# Patient Record
Sex: Male | Born: 1945
Health system: Southern US, Community
[De-identification: ages and names within clinical notes are randomized; demographics above are authoritative.]

## PROBLEM LIST (undated history)

## (undated) DIAGNOSIS — I10 Essential (primary) hypertension: Secondary | ICD-10-CM

## (undated) DIAGNOSIS — Z951 Presence of aortocoronary bypass graft: Secondary | ICD-10-CM

## (undated) DIAGNOSIS — I5042 Chronic combined systolic (congestive) and diastolic (congestive) heart failure: Secondary | ICD-10-CM

## (undated) DIAGNOSIS — R079 Chest pain, unspecified: Secondary | ICD-10-CM

## (undated) DIAGNOSIS — R0602 Shortness of breath: Secondary | ICD-10-CM

## (undated) DIAGNOSIS — I255 Ischemic cardiomyopathy: Secondary | ICD-10-CM

## (undated) DIAGNOSIS — I219 Acute myocardial infarction, unspecified: Secondary | ICD-10-CM

## (undated) DIAGNOSIS — I779 Disorder of arteries and arterioles, unspecified: Secondary | ICD-10-CM

## (undated) DIAGNOSIS — M1990 Unspecified osteoarthritis, unspecified site: Secondary | ICD-10-CM

## (undated) DIAGNOSIS — Z9189 Other specified personal risk factors, not elsewhere classified: Secondary | ICD-10-CM

## (undated) DIAGNOSIS — J9600 Acute respiratory failure, unspecified whether with hypoxia or hypercapnia: Secondary | ICD-10-CM

## (undated) DIAGNOSIS — J40 Bronchitis, not specified as acute or chronic: Secondary | ICD-10-CM

## (undated) DIAGNOSIS — N189 Chronic kidney disease, unspecified: Secondary | ICD-10-CM

## (undated) DIAGNOSIS — Z95810 Presence of automatic (implantable) cardiac defibrillator: Secondary | ICD-10-CM

## (undated) DIAGNOSIS — I2510 Atherosclerotic heart disease of native coronary artery without angina pectoris: Secondary | ICD-10-CM

## (undated) DIAGNOSIS — I739 Peripheral vascular disease, unspecified: Secondary | ICD-10-CM

## (undated) DIAGNOSIS — I499 Cardiac arrhythmia, unspecified: Secondary | ICD-10-CM

## (undated) DIAGNOSIS — E785 Hyperlipidemia, unspecified: Secondary | ICD-10-CM

## (undated) HISTORY — DX: Peripheral vascular disease, unspecified: I73.9

## (undated) HISTORY — DX: Disorder of arteries and arterioles, unspecified: I77.9

## (undated) HISTORY — PX: APPENDECTOMY: SHX54

## (undated) HISTORY — PX: JOINT REPLACEMENT: SHX530

## (undated) HISTORY — PX: OTHER SURGICAL HISTORY: SHX169

## (undated) HISTORY — PX: EYE SURGERY: SHX253

## (undated) HISTORY — DX: Hyperlipidemia, unspecified: E78.5

---

## 1997-08-10 ENCOUNTER — Other Ambulatory Visit: Admission: RE | Admit: 1997-08-10 | Discharge: 1997-08-10 | Payer: Self-pay | Admitting: Internal Medicine

## 2002-12-12 ENCOUNTER — Emergency Department (HOSPITAL_COMMUNITY): Admission: EM | Admit: 2002-12-12 | Discharge: 2002-12-12 | Payer: Self-pay | Admitting: Emergency Medicine

## 2002-12-13 ENCOUNTER — Encounter: Payer: Self-pay | Admitting: Emergency Medicine

## 2002-12-13 ENCOUNTER — Emergency Department (HOSPITAL_COMMUNITY): Admission: EM | Admit: 2002-12-13 | Discharge: 2002-12-13 | Payer: Self-pay | Admitting: Emergency Medicine

## 2003-02-05 ENCOUNTER — Emergency Department (HOSPITAL_COMMUNITY): Admission: EM | Admit: 2003-02-05 | Discharge: 2003-02-05 | Payer: Self-pay | Admitting: Emergency Medicine

## 2003-02-08 ENCOUNTER — Emergency Department (HOSPITAL_COMMUNITY): Admission: EM | Admit: 2003-02-08 | Discharge: 2003-02-08 | Payer: Self-pay | Admitting: Emergency Medicine

## 2004-01-29 ENCOUNTER — Emergency Department (HOSPITAL_COMMUNITY): Admission: EM | Admit: 2004-01-29 | Discharge: 2004-01-29 | Payer: Self-pay | Admitting: Emergency Medicine

## 2004-02-11 ENCOUNTER — Emergency Department (HOSPITAL_COMMUNITY): Admission: EM | Admit: 2004-02-11 | Discharge: 2004-02-11 | Payer: Self-pay | Admitting: *Deleted

## 2004-03-08 ENCOUNTER — Ambulatory Visit: Payer: Self-pay | Admitting: Internal Medicine

## 2004-03-09 ENCOUNTER — Ambulatory Visit: Payer: Self-pay | Admitting: Internal Medicine

## 2004-03-10 ENCOUNTER — Ambulatory Visit: Payer: Self-pay | Admitting: *Deleted

## 2004-03-12 DIAGNOSIS — Z951 Presence of aortocoronary bypass graft: Secondary | ICD-10-CM

## 2004-03-12 HISTORY — PX: CORONARY ARTERY BYPASS GRAFT: SHX141

## 2004-03-12 HISTORY — DX: Presence of aortocoronary bypass graft: Z95.1

## 2004-03-17 ENCOUNTER — Emergency Department (HOSPITAL_COMMUNITY): Admission: EM | Admit: 2004-03-17 | Discharge: 2004-03-17 | Payer: Self-pay | Admitting: Emergency Medicine

## 2004-03-23 ENCOUNTER — Ambulatory Visit: Payer: Self-pay | Admitting: Internal Medicine

## 2004-03-23 ENCOUNTER — Ambulatory Visit: Payer: Self-pay | Admitting: *Deleted

## 2004-03-30 ENCOUNTER — Ambulatory Visit: Payer: Self-pay | Admitting: Internal Medicine

## 2004-05-05 ENCOUNTER — Ambulatory Visit: Payer: Self-pay | Admitting: Internal Medicine

## 2004-06-04 ENCOUNTER — Inpatient Hospital Stay (HOSPITAL_COMMUNITY): Admission: EM | Admit: 2004-06-04 | Discharge: 2004-06-10 | Payer: Self-pay | Admitting: Emergency Medicine

## 2004-06-05 HISTORY — PX: CARDIAC CATHETERIZATION: SHX172

## 2004-06-06 ENCOUNTER — Ambulatory Visit: Payer: Self-pay | Admitting: Internal Medicine

## 2004-06-06 HISTORY — PX: CARDIAC CATHETERIZATION: SHX172

## 2004-06-16 ENCOUNTER — Ambulatory Visit: Payer: Self-pay | Admitting: Internal Medicine

## 2004-07-01 ENCOUNTER — Ambulatory Visit (HOSPITAL_COMMUNITY): Admission: RE | Admit: 2004-07-01 | Discharge: 2004-07-01 | Payer: Self-pay | Admitting: Internal Medicine

## 2004-07-11 ENCOUNTER — Ambulatory Visit: Payer: Self-pay | Admitting: Internal Medicine

## 2004-07-15 ENCOUNTER — Emergency Department (HOSPITAL_COMMUNITY): Admission: EM | Admit: 2004-07-15 | Discharge: 2004-07-15 | Payer: Self-pay | Admitting: Emergency Medicine

## 2004-10-09 ENCOUNTER — Emergency Department (HOSPITAL_COMMUNITY): Admission: EM | Admit: 2004-10-09 | Discharge: 2004-10-10 | Payer: Self-pay | Admitting: Emergency Medicine

## 2004-10-26 ENCOUNTER — Ambulatory Visit: Payer: Self-pay | Admitting: Internal Medicine

## 2004-11-11 ENCOUNTER — Inpatient Hospital Stay (HOSPITAL_COMMUNITY): Admission: EM | Admit: 2004-11-11 | Discharge: 2004-11-30 | Payer: Self-pay | Admitting: Emergency Medicine

## 2004-11-14 HISTORY — PX: CARDIAC CATHETERIZATION: SHX172

## 2004-11-15 ENCOUNTER — Encounter (INDEPENDENT_AMBULATORY_CARE_PROVIDER_SITE_OTHER): Payer: Self-pay | Admitting: Cardiovascular Disease

## 2004-11-17 ENCOUNTER — Encounter: Payer: Self-pay | Admitting: Cardiothoracic Surgery

## 2004-11-18 ENCOUNTER — Ambulatory Visit: Payer: Self-pay | Admitting: Critical Care Medicine

## 2004-11-18 ENCOUNTER — Encounter (INDEPENDENT_AMBULATORY_CARE_PROVIDER_SITE_OTHER): Payer: Self-pay | Admitting: *Deleted

## 2004-12-09 ENCOUNTER — Ambulatory Visit: Payer: Self-pay | Admitting: Family Medicine

## 2004-12-09 ENCOUNTER — Observation Stay (HOSPITAL_COMMUNITY): Admission: EM | Admit: 2004-12-09 | Discharge: 2004-12-10 | Payer: Self-pay | Admitting: Emergency Medicine

## 2004-12-20 ENCOUNTER — Ambulatory Visit: Payer: Self-pay | Admitting: Internal Medicine

## 2005-01-09 ENCOUNTER — Emergency Department (HOSPITAL_COMMUNITY): Admission: EM | Admit: 2005-01-09 | Discharge: 2005-01-09 | Payer: Self-pay | Admitting: Emergency Medicine

## 2005-01-13 ENCOUNTER — Emergency Department (HOSPITAL_COMMUNITY): Admission: EM | Admit: 2005-01-13 | Discharge: 2005-01-13 | Payer: Self-pay | Admitting: Emergency Medicine

## 2005-01-19 ENCOUNTER — Encounter: Admission: RE | Admit: 2005-01-19 | Discharge: 2005-01-19 | Payer: Self-pay | Admitting: Cardiothoracic Surgery

## 2005-03-19 ENCOUNTER — Ambulatory Visit: Payer: Self-pay | Admitting: Internal Medicine

## 2005-06-26 ENCOUNTER — Inpatient Hospital Stay (HOSPITAL_COMMUNITY): Admission: EM | Admit: 2005-06-26 | Discharge: 2005-06-29 | Payer: Self-pay | Admitting: Emergency Medicine

## 2005-08-28 ENCOUNTER — Ambulatory Visit: Payer: Self-pay | Admitting: Internal Medicine

## 2005-09-17 ENCOUNTER — Emergency Department (HOSPITAL_COMMUNITY): Admission: EM | Admit: 2005-09-17 | Discharge: 2005-09-17 | Payer: Self-pay | Admitting: *Deleted

## 2005-09-18 ENCOUNTER — Ambulatory Visit: Payer: Self-pay | Admitting: Internal Medicine

## 2005-10-08 ENCOUNTER — Ambulatory Visit: Payer: Self-pay | Admitting: Internal Medicine

## 2006-02-07 ENCOUNTER — Ambulatory Visit: Payer: Self-pay | Admitting: *Deleted

## 2006-02-14 ENCOUNTER — Emergency Department: Payer: Self-pay | Admitting: Emergency Medicine

## 2006-02-14 ENCOUNTER — Other Ambulatory Visit: Payer: Self-pay

## 2006-03-16 ENCOUNTER — Emergency Department (HOSPITAL_COMMUNITY): Admission: EM | Admit: 2006-03-16 | Discharge: 2006-03-17 | Payer: Self-pay | Admitting: Emergency Medicine

## 2006-03-17 ENCOUNTER — Inpatient Hospital Stay (HOSPITAL_COMMUNITY): Admission: EM | Admit: 2006-03-17 | Discharge: 2006-03-21 | Payer: Self-pay | Admitting: Emergency Medicine

## 2006-05-07 ENCOUNTER — Ambulatory Visit: Payer: Self-pay | Admitting: Internal Medicine

## 2006-06-03 ENCOUNTER — Ambulatory Visit (HOSPITAL_COMMUNITY): Admission: RE | Admit: 2006-06-03 | Discharge: 2006-06-03 | Payer: Self-pay | Admitting: Internal Medicine

## 2006-06-25 ENCOUNTER — Ambulatory Visit: Payer: Self-pay | Admitting: Internal Medicine

## 2006-08-19 ENCOUNTER — Ambulatory Visit: Payer: Self-pay | Admitting: Internal Medicine

## 2006-08-20 ENCOUNTER — Ambulatory Visit: Payer: Self-pay | Admitting: Internal Medicine

## 2006-10-03 ENCOUNTER — Ambulatory Visit: Payer: Self-pay | Admitting: Internal Medicine

## 2006-10-21 ENCOUNTER — Ambulatory Visit: Payer: Self-pay | Admitting: Internal Medicine

## 2006-10-21 LAB — CONVERTED CEMR LAB
Barbiturate Quant, Ur: NEGATIVE
Benzodiazepines.: NEGATIVE
CO2: 20 meq/L (ref 19–32)
Calcium: 10.3 mg/dL (ref 8.4–10.5)
Chloride: 110 meq/L (ref 96–112)
Cocaine Metabolites: NEGATIVE
Creatinine, Ser: 2.11 mg/dL — ABNORMAL HIGH (ref 0.40–1.20)
HDL: 38 mg/dL — ABNORMAL LOW (ref >39)
LDL Cholesterol: 60 mg/dL (ref 0–99)
Marijuana Metabolite: NEGATIVE
Microalb, Ur: 15 mg/dL — ABNORMAL HIGH (ref 0.00–1.89)
Opiate Screen, Urine: NEGATIVE
Phencyclidine (PCP): NEGATIVE
Triglycerides: 236 mg/dL — ABNORMAL HIGH (ref <150)

## 2006-11-01 DIAGNOSIS — E089 Diabetes mellitus due to underlying condition without complications: Secondary | ICD-10-CM | POA: Insufficient documentation

## 2006-11-01 DIAGNOSIS — I2510 Atherosclerotic heart disease of native coronary artery without angina pectoris: Secondary | ICD-10-CM | POA: Insufficient documentation

## 2006-11-01 DIAGNOSIS — N259 Disorder resulting from impaired renal tubular function, unspecified: Secondary | ICD-10-CM | POA: Insufficient documentation

## 2006-11-01 DIAGNOSIS — I252 Old myocardial infarction: Secondary | ICD-10-CM | POA: Insufficient documentation

## 2006-11-01 DIAGNOSIS — F1910 Other psychoactive substance abuse, uncomplicated: Secondary | ICD-10-CM | POA: Insufficient documentation

## 2006-11-01 DIAGNOSIS — I509 Heart failure, unspecified: Secondary | ICD-10-CM | POA: Insufficient documentation

## 2006-11-01 DIAGNOSIS — F1410 Cocaine abuse, uncomplicated: Secondary | ICD-10-CM | POA: Insufficient documentation

## 2006-11-14 ENCOUNTER — Ambulatory Visit: Payer: Self-pay | Admitting: Internal Medicine

## 2006-11-21 ENCOUNTER — Ambulatory Visit: Payer: Self-pay | Admitting: Internal Medicine

## 2006-11-27 ENCOUNTER — Encounter (INDEPENDENT_AMBULATORY_CARE_PROVIDER_SITE_OTHER): Payer: Self-pay | Admitting: *Deleted

## 2006-12-06 ENCOUNTER — Ambulatory Visit: Payer: Self-pay | Admitting: Internal Medicine

## 2006-12-19 ENCOUNTER — Ambulatory Visit: Payer: Self-pay | Admitting: Internal Medicine

## 2007-01-17 ENCOUNTER — Ambulatory Visit: Payer: Self-pay | Admitting: Internal Medicine

## 2007-01-17 LAB — CONVERTED CEMR LAB
Benzodiazepines.: NEGATIVE
Marijuana Metabolite: NEGATIVE
Phencyclidine (PCP): NEGATIVE

## 2007-05-29 ENCOUNTER — Ambulatory Visit: Payer: Self-pay | Admitting: Internal Medicine

## 2007-07-02 ENCOUNTER — Ambulatory Visit: Payer: Self-pay | Admitting: Internal Medicine

## 2007-07-02 LAB — CONVERTED CEMR LAB
ALT: 14 units/L (ref 0–35)
AST: 16 units/L (ref 0–37)
Albumin: 4.1 g/dL (ref 3.5–5.2)
Alkaline Phosphatase: 134 units/L — ABNORMAL HIGH (ref 39–117)
Amphetamine Screen, Ur: NEGATIVE
BUN: 33 mg/dL — ABNORMAL HIGH (ref 6–23)
Barbiturate Quant, Ur: NEGATIVE
Benzodiazepines.: NEGATIVE
CO2: 19 meq/L (ref 19–32)
Calcium: 9.9 mg/dL (ref 8.4–10.5)
Cocaine Metabolites: NEGATIVE
Phencyclidine (PCP): NEGATIVE
Total Bilirubin: 0.3 mg/dL (ref 0.3–1.2)
Total Protein: 8 g/dL (ref 6.0–8.3)

## 2007-08-12 ENCOUNTER — Ambulatory Visit: Payer: Self-pay | Admitting: Internal Medicine

## 2008-02-14 ENCOUNTER — Inpatient Hospital Stay: Payer: Medicare Other | Admitting: Psychiatry

## 2008-04-09 ENCOUNTER — Encounter
Admission: RE | Admit: 2008-04-09 | Discharge: 2008-06-04 | Payer: Self-pay | Admitting: Physical Medicine & Rehabilitation

## 2008-06-04 ENCOUNTER — Ambulatory Visit: Payer: Self-pay | Admitting: Physical Medicine & Rehabilitation

## 2008-06-08 ENCOUNTER — Ambulatory Visit (HOSPITAL_COMMUNITY)
Admission: RE | Admit: 2008-06-08 | Discharge: 2008-06-08 | Payer: Self-pay | Admitting: Physical Medicine & Rehabilitation

## 2008-07-05 ENCOUNTER — Encounter
Admission: RE | Admit: 2008-07-05 | Discharge: 2008-07-15 | Payer: Self-pay | Admitting: Physical Medicine & Rehabilitation

## 2008-07-15 ENCOUNTER — Ambulatory Visit: Payer: Self-pay | Admitting: Physical Medicine & Rehabilitation

## 2008-08-05 ENCOUNTER — Ambulatory Visit: Payer: Self-pay | Admitting: Internal Medicine

## 2008-08-05 LAB — CONVERTED CEMR LAB
ALT: 11 units/L (ref 0–35)
AST: 13 units/L (ref 0–37)
Alkaline Phosphatase: 132 units/L — ABNORMAL HIGH (ref 39–117)
CO2: 23 meq/L (ref 19–32)
Calcium: 9.5 mg/dL (ref 8.4–10.5)
Cholesterol: 177 mg/dL (ref 0–200)
HDL: 50 mg/dL (ref >39)
LDL Cholesterol: 94 mg/dL (ref 0–99)
Phosphorus: 2.3 mg/dL (ref 2.3–4.6)
Potassium: 3.8 meq/L (ref 3.5–5.3)
Sodium: 140 meq/L (ref 135–145)
Total CHOL/HDL Ratio: 3.5
Total Protein: 7.4 g/dL (ref 6.0–8.3)
Triglycerides: 167 mg/dL — ABNORMAL HIGH (ref <150)

## 2008-11-21 ENCOUNTER — Inpatient Hospital Stay: Payer: Medicare Other | Admitting: Psychiatry

## 2009-03-24 ENCOUNTER — Ambulatory Visit: Payer: Self-pay | Admitting: Internal Medicine

## 2009-03-24 LAB — CONVERTED CEMR LAB
AST: 11 units/L (ref 0–37)
Alkaline Phosphatase: 152 units/L — ABNORMAL HIGH (ref 39–117)
CO2: 26 meq/L (ref 19–32)
Calcium: 9.8 mg/dL (ref 8.4–10.5)
Chloride: 108 meq/L (ref 96–112)
Glucose, Bld: 136 mg/dL — ABNORMAL HIGH (ref 70–99)
Potassium: 4.1 meq/L (ref 3.5–5.3)
Sodium: 142 meq/L (ref 135–145)
Total Bilirubin: 0.2 mg/dL — ABNORMAL LOW (ref 0.3–1.2)
Total CHOL/HDL Ratio: 3
Triglycerides: 67 mg/dL (ref <150)

## 2009-09-23 ENCOUNTER — Ambulatory Visit: Payer: Self-pay | Admitting: Internal Medicine

## 2009-10-15 ENCOUNTER — Inpatient Hospital Stay (HOSPITAL_COMMUNITY): Admission: EM | Admit: 2009-10-15 | Discharge: 2009-10-18 | Payer: Self-pay | Admitting: Emergency Medicine

## 2009-10-26 ENCOUNTER — Encounter (INDEPENDENT_AMBULATORY_CARE_PROVIDER_SITE_OTHER): Payer: Self-pay | Admitting: Cardiology

## 2009-10-26 ENCOUNTER — Ambulatory Visit (HOSPITAL_COMMUNITY): Admission: RE | Admit: 2009-10-26 | Discharge: 2009-10-26 | Payer: Self-pay | Admitting: Cardiology

## 2009-10-27 ENCOUNTER — Emergency Department (HOSPITAL_COMMUNITY): Admission: EM | Admit: 2009-10-27 | Discharge: 2009-10-27 | Payer: Self-pay | Admitting: Emergency Medicine

## 2009-11-04 ENCOUNTER — Ambulatory Visit: Payer: Self-pay | Admitting: Internal Medicine

## 2009-11-14 ENCOUNTER — Emergency Department (HOSPITAL_COMMUNITY): Admission: EM | Admit: 2009-11-14 | Discharge: 2009-11-14 | Payer: Self-pay | Admitting: Emergency Medicine

## 2009-11-23 ENCOUNTER — Inpatient Hospital Stay (HOSPITAL_COMMUNITY): Admission: EM | Admit: 2009-11-23 | Discharge: 2009-11-25 | Payer: Self-pay | Admitting: Emergency Medicine

## 2010-01-03 ENCOUNTER — Emergency Department (HOSPITAL_COMMUNITY)
Admission: EM | Admit: 2010-01-03 | Discharge: 2010-01-03 | Payer: Self-pay | Source: Home / Self Care | Admitting: Emergency Medicine

## 2010-04-01 ENCOUNTER — Encounter: Payer: Self-pay | Admitting: Cardiothoracic Surgery

## 2010-05-24 LAB — URINALYSIS, ROUTINE W REFLEX MICROSCOPIC
Leukocytes, UA: NEGATIVE
pH: 5 (ref 5.0–8.0)

## 2010-05-24 LAB — URINE MICROSCOPIC-ADD ON

## 2010-05-25 LAB — DIFFERENTIAL
Basophils Relative: 1 % (ref 0–1)
Eosinophils Absolute: 0.1 10*3/uL (ref 0.0–0.7)
Eosinophils Relative: 4 % (ref 0–5)
Lymphs Abs: 1 10*3/uL (ref 0.7–4.0)
Monocytes Relative: 8 % (ref 3–12)
Neutrophils Relative %: 57 % (ref 43–77)

## 2010-05-25 LAB — BASIC METABOLIC PANEL
BUN: 28 mg/dL — ABNORMAL HIGH (ref 6–23)
CO2: 24 mEq/L (ref 19–32)
CO2: 25 mEq/L (ref 19–32)
Calcium: 10 mg/dL (ref 8.4–10.5)
Calcium: 9.3 mg/dL (ref 8.4–10.5)
Chloride: 112 mEq/L (ref 96–112)
Creatinine, Ser: 1.88 mg/dL — ABNORMAL HIGH (ref 0.4–1.5)
Creatinine, Ser: 1.99 mg/dL — ABNORMAL HIGH (ref 0.4–1.5)
Creatinine, Ser: 2.19 mg/dL — ABNORMAL HIGH (ref 0.4–1.5)
GFR calc Af Amer: 37 mL/min — ABNORMAL LOW (ref >60)
GFR calc Af Amer: 41 mL/min — ABNORMAL LOW (ref >60)
GFR calc Af Amer: 44 mL/min — ABNORMAL LOW (ref >60)
GFR calc non Af Amer: 31 mL/min — ABNORMAL LOW (ref >60)
GFR calc non Af Amer: 34 mL/min — ABNORMAL LOW (ref >60)
GFR calc non Af Amer: 36 mL/min — ABNORMAL LOW (ref >60)
Glucose, Bld: 175 mg/dL — ABNORMAL HIGH (ref 70–99)
Sodium: 143 mEq/L (ref 135–145)

## 2010-05-25 LAB — URINALYSIS, ROUTINE W REFLEX MICROSCOPIC
Bilirubin Urine: NEGATIVE
Glucose, UA: NEGATIVE mg/dL
Hgb urine dipstick: NEGATIVE
Ketones, ur: NEGATIVE mg/dL
Protein, ur: 100 mg/dL — AB
Urobilinogen, UA: 1 mg/dL (ref 0.0–1.0)

## 2010-05-25 LAB — POCT CARDIAC MARKERS
CKMB, poc: <1 ng/mL — ABNORMAL LOW (ref 1.0–8.0)
Myoglobin, poc: 85.6 ng/mL (ref 12–200)
Troponin i, poc: <0.05 ng/mL (ref 0.00–0.09)

## 2010-05-25 LAB — CBC
HCT: 32.1 % — ABNORMAL LOW (ref 39.0–52.0)
Hemoglobin: 10.6 g/dL — ABNORMAL LOW (ref 13.0–17.0)
Hemoglobin: 10.9 g/dL — ABNORMAL LOW (ref 13.0–17.0)
MCV: 90.8 fL (ref 78.0–100.0)
RDW: 13.3 % (ref 11.5–15.5)
WBC: 3.2 10*3/uL — ABNORMAL LOW (ref 4.0–10.5)

## 2010-05-25 LAB — URINE DRUGS OF ABUSE SCREEN W ALC, ROUTINE (REF LAB)
Amphetamine Screen, Ur: NEGATIVE
Benzodiazepines.: NEGATIVE
Cocaine Metabolites: NEGATIVE
Ethyl Alcohol: <10 mg/dL (ref <10)
Opiate Screen, Urine: NEGATIVE
Phencyclidine (PCP): NEGATIVE

## 2010-05-25 LAB — CARDIAC PANEL(CRET KIN+CKTOT+MB+TROPI)
CK, MB: 1.2 ng/mL (ref 0.3–4.0)
Relative Index: INVALID (ref 0.0–2.5)
Relative Index: INVALID (ref 0.0–2.5)
Relative Index: INVALID (ref 0.0–2.5)
Total CK: 78 U/L (ref 7–232)
Total CK: 85 U/L (ref 7–232)
Troponin I: 0.04 ng/mL (ref 0.00–0.06)

## 2010-05-25 LAB — URINE MICROSCOPIC-ADD ON

## 2010-05-25 LAB — MAGNESIUM: Magnesium: 2.3 mg/dL (ref 1.5–2.5)

## 2010-05-25 LAB — GLUCOSE, CAPILLARY
Glucose-Capillary: 118 mg/dL — ABNORMAL HIGH (ref 70–99)
Glucose-Capillary: 233 mg/dL — ABNORMAL HIGH (ref 70–99)
Glucose-Capillary: 155 mg/dL — ABNORMAL HIGH (ref 70–99)

## 2010-05-25 LAB — COMPREHENSIVE METABOLIC PANEL
ALT: 26 U/L (ref 0–53)
AST: 26 U/L (ref 0–37)
Alkaline Phosphatase: 135 U/L — ABNORMAL HIGH (ref 39–117)
CO2: 23 mEq/L (ref 19–32)
GFR calc Af Amer: 40 mL/min — ABNORMAL LOW (ref >60)
Glucose, Bld: 160 mg/dL — ABNORMAL HIGH (ref 70–99)
Potassium: 4.8 mEq/L (ref 3.5–5.1)
Sodium: 142 mEq/L (ref 135–145)
Total Protein: 6.6 g/dL (ref 6.0–8.3)

## 2010-05-25 LAB — HEMOGLOBIN A1C
Hgb A1c MFr Bld: 7.9 % — ABNORMAL HIGH (ref <5.7)
Mean Plasma Glucose: 180 mg/dL — ABNORMAL HIGH (ref <117)

## 2010-05-25 LAB — TSH: TSH: 1.829 u[IU]/mL (ref 0.350–4.500)

## 2010-05-25 LAB — BRAIN NATRIURETIC PEPTIDE
Pro B Natriuretic peptide (BNP): 446 pg/mL — ABNORMAL HIGH (ref 0.0–100.0)
Pro B Natriuretic peptide (BNP): 478 pg/mL — ABNORMAL HIGH (ref 0.0–100.0)

## 2010-05-25 LAB — STREP A DNA PROBE

## 2010-05-26 LAB — LIPID PANEL
Cholesterol: 136 mg/dL (ref 0–200)
HDL: 57 mg/dL (ref >39)
Total CHOL/HDL Ratio: 2.4 RATIO
VLDL: 12 mg/dL (ref 0–40)

## 2010-05-26 LAB — GLUCOSE, CAPILLARY
Glucose-Capillary: 120 mg/dL — ABNORMAL HIGH (ref 70–99)
Glucose-Capillary: 134 mg/dL — ABNORMAL HIGH (ref 70–99)
Glucose-Capillary: 154 mg/dL — ABNORMAL HIGH (ref 70–99)
Glucose-Capillary: 162 mg/dL — ABNORMAL HIGH (ref 70–99)
Glucose-Capillary: 169 mg/dL — ABNORMAL HIGH (ref 70–99)
Glucose-Capillary: 185 mg/dL — ABNORMAL HIGH (ref 70–99)

## 2010-05-26 LAB — CBC
HCT: 33.1 % — ABNORMAL LOW (ref 39.0–52.0)
Hemoglobin: 11 g/dL — ABNORMAL LOW (ref 13.0–17.0)
Hemoglobin: 11.1 g/dL — ABNORMAL LOW (ref 13.0–17.0)
Hemoglobin: 11.1 g/dL — ABNORMAL LOW (ref 13.0–17.0)
MCH: 30 pg (ref 26.0–34.0)
MCH: 31 pg (ref 26.0–34.0)
MCHC: 33.2 g/dL (ref 30.0–36.0)
MCV: 92.5 fL (ref 78.0–100.0)
Platelets: 153 10*3/uL (ref 150–400)
RBC: 3.58 MIL/uL — ABNORMAL LOW (ref 4.22–5.81)
RBC: 3.7 MIL/uL — ABNORMAL LOW (ref 4.22–5.81)
RDW: 13.5 % (ref 11.5–15.5)
WBC: 4.7 10*3/uL (ref 4.0–10.5)
WBC: 4.8 10*3/uL (ref 4.0–10.5)

## 2010-05-26 LAB — COMPREHENSIVE METABOLIC PANEL
ALT: 15 U/L (ref 0–53)
ALT: 17 U/L (ref 0–53)
AST: 14 U/L (ref 0–37)
AST: 15 U/L (ref 0–37)
Albumin: 3.2 g/dL — ABNORMAL LOW (ref 3.5–5.2)
Albumin: 3.2 g/dL — ABNORMAL LOW (ref 3.5–5.2)
CO2: 23 mEq/L (ref 19–32)
Calcium: 9.1 mg/dL (ref 8.4–10.5)
Chloride: 114 mEq/L — ABNORMAL HIGH (ref 96–112)
Chloride: 115 mEq/L — ABNORMAL HIGH (ref 96–112)
Creatinine, Ser: 1.67 mg/dL — ABNORMAL HIGH (ref 0.4–1.5)
Creatinine, Ser: 1.76 mg/dL — ABNORMAL HIGH (ref 0.4–1.5)
GFR calc Af Amer: 48 mL/min — ABNORMAL LOW (ref >60)
GFR calc Af Amer: 51 mL/min — ABNORMAL LOW (ref >60)
GFR calc non Af Amer: 39 mL/min — ABNORMAL LOW (ref >60)
Sodium: 142 mEq/L (ref 135–145)
Sodium: 143 mEq/L (ref 135–145)
Total Bilirubin: 0.6 mg/dL (ref 0.3–1.2)

## 2010-05-26 LAB — POCT I-STAT, CHEM 8
BUN: 27 mg/dL — ABNORMAL HIGH (ref 6–23)
Calcium, Ion: 1.24 mmol/L (ref 1.12–1.32)
Creatinine, Ser: 1.8 mg/dL — ABNORMAL HIGH (ref 0.4–1.5)
Hemoglobin: 11.6 g/dL — ABNORMAL LOW (ref 13.0–17.0)
TCO2: 21 mmol/L (ref 0–100)

## 2010-05-26 LAB — DIFFERENTIAL
Eosinophils Absolute: 0.1 10*3/uL (ref 0.0–0.7)
Eosinophils Absolute: 0.1 10*3/uL (ref 0.0–0.7)
Eosinophils Relative: 3 % (ref 0–5)
Eosinophils Relative: 3 % (ref 0–5)
Lymphocytes Relative: 19 % (ref 12–46)
Lymphocytes Relative: 19 % (ref 12–46)
Lymphs Abs: 0.9 10*3/uL (ref 0.7–4.0)
Lymphs Abs: 0.9 10*3/uL (ref 0.7–4.0)
Monocytes Absolute: 0.4 10*3/uL (ref 0.1–1.0)
Monocytes Absolute: 0.4 10*3/uL (ref 0.1–1.0)

## 2010-05-26 LAB — BASIC METABOLIC PANEL
CO2: 24 mEq/L (ref 19–32)
Calcium: 9.2 mg/dL (ref 8.4–10.5)
Chloride: 106 mEq/L (ref 96–112)
GFR calc Af Amer: 40 mL/min — ABNORMAL LOW (ref >60)
GFR calc Af Amer: 49 mL/min — ABNORMAL LOW (ref >60)
GFR calc non Af Amer: 35 mL/min — ABNORMAL LOW (ref >60)
GFR calc non Af Amer: 40 mL/min — ABNORMAL LOW (ref >60)
Glucose, Bld: 139 mg/dL — ABNORMAL HIGH (ref 70–99)
Potassium: 3.5 mEq/L (ref 3.5–5.1)
Sodium: 137 mEq/L (ref 135–145)
Sodium: 138 mEq/L (ref 135–145)
Sodium: 140 mEq/L (ref 135–145)

## 2010-05-26 LAB — PROTIME-INR
INR: 1.04 (ref 0.00–1.49)
Prothrombin Time: 13.8 seconds (ref 11.6–15.2)

## 2010-05-26 LAB — BRAIN NATRIURETIC PEPTIDE: Pro B Natriuretic peptide (BNP): 308 pg/mL — ABNORMAL HIGH (ref 0.0–100.0)

## 2010-05-26 LAB — APTT: aPTT: 33 seconds (ref 24–37)

## 2010-06-13 ENCOUNTER — Other Ambulatory Visit: Payer: Self-pay | Admitting: Internal Medicine

## 2010-07-12 ENCOUNTER — Other Ambulatory Visit: Payer: Self-pay | Admitting: Internal Medicine

## 2010-07-13 ENCOUNTER — Other Ambulatory Visit: Payer: Self-pay | Admitting: Internal Medicine

## 2010-07-25 ENCOUNTER — Emergency Department (HOSPITAL_COMMUNITY)
Admission: EM | Admit: 2010-07-25 | Discharge: 2010-07-25 | Disposition: A | Discharge status: Home / Self Care | Payer: Medicare Other | Attending: Emergency Medicine | Admitting: Emergency Medicine

## 2010-07-25 DIAGNOSIS — I2510 Atherosclerotic heart disease of native coronary artery without angina pectoris: Secondary | ICD-10-CM | POA: Insufficient documentation

## 2010-07-25 DIAGNOSIS — M25539 Pain in unspecified wrist: Secondary | ICD-10-CM | POA: Insufficient documentation

## 2010-07-25 DIAGNOSIS — I252 Old myocardial infarction: Secondary | ICD-10-CM | POA: Insufficient documentation

## 2010-07-25 DIAGNOSIS — E119 Type 2 diabetes mellitus without complications: Secondary | ICD-10-CM | POA: Insufficient documentation

## 2010-07-25 DIAGNOSIS — I509 Heart failure, unspecified: Secondary | ICD-10-CM | POA: Insufficient documentation

## 2010-07-25 DIAGNOSIS — I10 Essential (primary) hypertension: Secondary | ICD-10-CM | POA: Insufficient documentation

## 2010-07-25 NOTE — Procedures (Signed)
NAME:  RONDO, KOEL NO.:  [id number]   MEDICAL RECORD NO.:  [id number]           PATIENT TYPE:   LOCATION:                                 FACILITY:   PHYSICIAN:  Charlett Blake, M.D.DATE OF BIRTH:  01-04-1946   DATE OF PROCEDURE:  07/15/2008  DATE OF DISCHARGE:                               OPERATIVE REPORT   Right sacroiliac injection under fluoroscopic guidance.   INDICATION:  Right sacroiliac mediated pain.  He has pain in the buttock  and then the back, history of pain after that persisted despite hip  replacement.  Pain is only partially responsive to medication management  on a conservative care and interferes with self-care mobility.   Informed consent was obtained after describing the risks and benefits of  the procedure with the patient.  These include bleeding, bruising, and  infection.  He elects to proceed and has given written consent.  The  patient placed prone on fluoroscopy table.  Betadine prep, sterile  drape.  A 25-gauge 1-/12-inch needle was used to anesthetize the skin  and subcutaneous tissue with 1% lidocaine x2 mL and a 25-gauge 3-inches  spinal needle was inserted under fluoroscopic guidance, first targeting  the right SI joint.  AP, lateral, and oblique imaging utilized.  Omnipaque 180 under live fluoro x 0.5 mL demonstrated good joint  arthrogram followed by injection of 1 mL of 2% MPF lidocaine and 1.5 mL  of 40 mg/mL of Depo-Medrol.  The patient tolerated the procedure well.  Pre-injection pain level is 9/10.  Post-injection is 0/10.      Charlett Blake, M.D.  Electronically Signed     AEK/MEDQ  D:  07/15/2008 09:03:44  T:  07/15/2008 22:56:47  Job:  [id number]
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
## 2010-07-28 NOTE — H&P (Signed)
NAME:  [name], [name]               ACCOUNT NO.:  [id number]   MEDICAL RECORD NO.:  [id number]          PATIENT TYPE:  INP   LOCATION:  1403                         FACILITY:  [facility]   PHYSICIAN:  Hind I Elsaid, MD      DATE OF BIRTH:  12/20/1945   DATE OF ADMISSION:  03/17/2006  DATE OF DISCHARGE:                              HISTORY & PHYSICAL   PRIMARY CARE PHYSICIAN:  Unassigned.   HISTORY OF PRESENT ILLNESS:  A 65 year old African American male with  history of coronary artery disease, status post bypass in 2006, history  of cocaine abuse, admitted to the hospital with history of right flank  pain.  The patient admitted he used overdose of cocaine for two days  about $1400 dollars of crack per day, and the patient on Saturday for  two days and the patient decided on Saturday, he wanted to be detoxed  and to quit cocaine abuse.  The same day on he continued to complain of  right flank pain, rated pain 10 out of 10,  sharp pain, not radiating  and also status post fever. No associated with change in bowel habits.  No nausea, no vomiting but he noticed a small amount of his diminishing  urine output according to him.  Also no change in the color of the  urine.  He also complained of right hip pain, right thigh pain and .Marland Kitchen...   Dictation Ended At [employer].      Hind Franco Collet, MD  Electronically Signed     HIE/MEDQ  D:  03/17/2006  T:  03/18/2006  Job:  [id number]

## 2010-07-28 NOTE — H&P (Signed)
NAME:  [name], [name]               ACCOUNT NO.:  [id number]   MEDICAL RECORD NO.:  [id number]          PATIENT TYPE:  EMS   LOCATION:  ED                           FACILITY:  [facility]   PHYSICIAN:  Minna Merritts, M.D.DATE OF BIRTH:  [birth year]   DATE OF ADMISSION:  11/11/2004  DATE OF DISCHARGE:                                HISTORY & PHYSICAL   PRIMARY PHYSICIAN:  [facility].   CHIEF COMPLAINT:  Chest pain for last 8-9 hours.   HISTORY OF PRESENT ILLNESS:  This is a 64 year old African-American male  patient with a significant history of coronary artery disease, diabetes,  substance abuse, was in his usual state of health until last midnight when  he suddenly started left-sided chest pain.  The pain was 10/10 severe to  start with, nonradiating, was associated with diaphoresis.  The patient  denies having any nausea, vomiting, shortness of breath, dizziness.  The  pain at present is 8/10.  The pain is constantly present over the last 9  hours.  Of note, the patient uses cocaine.  His last use was 2 days ago.  In  the emergency department, the patient was given intravenous Ativan.  At  present, it is difficult to get any history or review of systems from the  patient.  The patient is very drowsy and drifts off to sleep after saying 1-  2 words.  Also, it is difficult to get any review of systems from him.   PAST MEDICAL HISTORY:  1.  Coronary artery disease; he is status post ST elevation MI and has had      drug-eluting stents placed to LAD, circumflex, right coronary artery in      on June 05, 2004.  2.  On coronary angiogram, he was found to have systolic dysfunction with      left ventricular systolic function of about 40-45%.  3.  Diabetes, type 2, uncontrolled as per the history from his last      hospitalization in March 2006.  At that time, his hemoglobin A1c was      8.1.  4.  Hypertension.  5.  Substance abuse.  6.  Hyperlipidemia.   MEDICATIONS:  As  mentioned above, at present, the patient is very drowsy and  unable to give history.  As per his discharge from April 2006, he was given  following medications:  1.  Aspirin 325 mg daily.  2.  Plavix  3.  Zocor 40 mg daily.  4.  Enalapril 10 mg daily.  5.  Glucotrol 10 mg p.o. twice daily.  6.  Glucophage 500 mg p.o. twice daily.  7.  Nitroglycerin 0.4 mg sublingual p.r.n.  8.  Labetalol 300 mg p.o. twice daily.  9.  The patient mentions that he take takes hydrochlorothiazide as well.   FAMILY HISTORY:  The patient is a father of three; there is a family history  of hypertension, history of heart attack in his grandfather.   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  He has  a history of cocaine drug use since 1970s.  He  was in rehab some time through the years.  For 1 year he was free of  cocaine, and then he started again.  He lives by himself; he is a nonsmoker  and does not use any alcohol.   REVIEW OF SYSTEMS:  As mentioned above unable to obtain at this time.   PHYSICAL EXAMINATION:  GENERAL APPEARANCE:  The patient does not appear to  be in acute distress; he is very drowsy and drifts off to sleep.  VITAL SIGNS:  His blood pressure is 143/65, temperature is 96.4 decreased.  Pulse is 75 per minute, respirations 18 per minute.  His pain and chest pain  at this time is 8/10; he is saturating 96% on room air.  HEENT:  Head is atraumatic, normocephalic; pupils are equally reactive to  light bilaterally; extraocular muscles are difficult to assess at this time.  His pharynx appears to be normal.  NECK:  It is supple, no jugular venous distension, no carotid bruit.  Thyroid appears normal.  CARDIOVASCULAR:  S1, S2 is normal, regular rate and rhythm, no murmurs.  CHEST:  There is palpation tenderness over the precordium and slightly  towards his left shoulder.  PULMONARY:  Clear to auscultation bilaterally, no adventitious  sounds.  ABDOMEN:  Soft, nontender, nondistended, equal normally active bowel sounds  in all quadrants, no hepatosplenomegaly.  EXTREMITIES:  No cyanosis, clubbing or edema.  NEUROLOGICAL:  The patient answers questions appropriately, although he is  very drowsy at this time.  He is oriented in time, place, and person.  Overall. the neurological examination is without any focal deficit.   LABORATORY DATA:  His WBC count is 5.1, hemoglobin 11.7, hematocrit 34.7,  platelet count is 168,000.  Sodium is 139, potassium 4.1, chloride 106, CO2  is 23, BUN is 27, creatinine is 1.7.  Reviewing his previous labs from the e-  chart, his serum creatinine baseline appears to be 1.4.   First set of cardiac enzymes shows myoglobin also elevated at 491.  CK-MB is  2.7 which is normal, and troponin I is less than 0.05.  Next, a chest x-ray  is negative, does not show any active cardiopulmonary disease. Next, 12-lead  ECG shows a heart normal sinus rhythm, it shows frequent PVCs, otherwise  there do not appear to be any significant ST-T changes.   ASSESSMENT AND PLAN:  7.  A 65 year old African-American male with significant history of coronary      artery disease, cocaine abuse presenting with chest pain.  At this time      we will admit him to telemetry unit.  We will start aspirin and Plavix.      He will also be started on nitro paste as well.  We will get serial      cardiac enzymes and 12-lead ECGs.  If his chest pain does not improve or      if he has any significant findings on his labs or 12-lead ECG, we will      consult cardiology.  2.  Diabetes type 2.  At this time. we will hold off his oral hypoglycemic      agents.  We will start him on insulin sliding scale.  We will be      checking his capillary blood sugars q.h.s., q.a.c.  Also, we will check      a hemoglobin A1c. 3.  Hypertension.  He will be started back on his regular  medications which      include hydrochlorothiazide, Norvasc,  enalapril.  As per the discharge      note from April 2006, he was placed on labetalol.  It is not clear      though if he continues to take it.  We will resume if he takes it      regularly at home.  4.  Hyperlipidemia.  We will continue the Zocor at 40 mg daily.  We will get      a fasting lipid profile in the morning.  5.  Substance abuse.  The patient asking for help regarding rehabilitation.      We will consult case management.  6.  For prophylaxis of PUD and deep venous thrombosis, we will put him on      Lovenox subcutaneous 40 mg daily, Protonix p.o.           ______________________________  Minna Merritts, M.D.     PMJ/MEDQ  D:  11/11/2004  T:  11/11/2004  Job:  [id number]

## 2010-07-28 NOTE — Discharge Summary (Signed)
NAMECLEAVELAND, SEGA               ACCOUNT NO.:  [id number]   MEDICAL RECORD NO.:  [id number]          PATIENT TYPE:  INP   LOCATION:  [id number]                         FACILITY:  [facility]   PHYSICIAN:  Vernell Leep, MD     DATE OF BIRTH:  [birth year]   DATE OF ADMISSION:  03/17/2006  DATE OF DISCHARGE:  03/21/2006                               DISCHARGE SUMMARY   Patient left AMA.   PRIMARY CARE PHYSICIAN:  Unclear at this time.  Patient has seen Dr.  Erin Hearing of family medicine in the past.  Have placed a call to the  service at [facility] to clarify this and if they are willing  to follow him up, otherwise patient will be unassigned to the [facility] system.   DISCHARGE DIAGNOSES:  1. Right lower back pain.  2. Substance abuse.  3. Diabetes.  4. Hypertension.  5. Coronary artery disease.  6. Acute renal failure.  7. Pancytopenia.  8. Right upper pole kidney lesion on CT, to be followed up and worked      up as an outpatient as deemed necessary.   DISCHARGE MEDICATIONS:  1. Aspirin 81 mg p.o. daily.  2. Plavix 75 mg p.o. daily.  3. Avandia 2 mg p.o. daily.  4. Glucotrol 5 mg p.o. daily.  5. Norvasc 10 mg p.o. daily.  6. Catapres 0.1 mg p.o. b.i.d.  7. Flexeril 5 mg p.o. 3 times a day for a week.  8. Tylenol 650 mg p.o. q.4-6h. p.r.n.   PROCEDURES DONE:  1. On March 19, 2006, a CT of the abdomen and pelvis with contrast.      Impression is there is no evidence of obstructive uropathy.  No      nephrolithiasis is noted.  A low density, exophytic lesion arising      from the upper pole of the right kidney is unchanged but it is      incompletely characterized without IV contrast media.  Atelectasis      versus scar at the left base.  2. Pelvic CT without contrast.  Impression:  Stable CT of the pelvis.      No acute findings.  No bladder calculi.  3. On March 18, 2006, chest x-ray.  Impression:  No acute findings.      Postoperative changes from coronary  bypass.  Chronic left lower      lobe scarring.  Right lower chest nipple shadow, stable compared to      the 19th of April, 2007.  4. On March 18, 2006, renal ultrasound.  Impression is right renal      cyst.  No hydronephrosis.  5. On March 17, 2006, the abdominal CT without contrast.  Impression      is stable exam.  No acute findings.  6. Pelvic CT without contrast.  Impression is stable with no acute      findings.  7. On January 6th is an x-ray of the right femur.  Impression:  No      acute bony findings.  8. On March 17, 2006 is an x-ray  of the right hip.  Impression,      status post open reduction/internal fixation for right proximal      femur fracture.  No acute bony findings.  9. On March 17, 2006, portable chest x-ray.  Impression, stable      cardiomegaly.  No active lung disease.  Linear scarring or      atelectasis remains of the left base.   CONSULTATIONS OBTAINED:  Psychiatry, Dr. Rhona Raider.   HOSPITAL COURSE/CONDITION OF PATIENT ON DISCHARGE:  For details of the  initial admission, please refer to the history and physical note done by  Dr. Laurie Panda on March 17, 2006.  In summary, Mr. Gilstrap is a pleasant  65 year old African-American male patient with past medical history of  coronary artery disease, status post CABG, diabetes, hypertension,  chronic kidney disease, who presented with history of right flank pain,  right thigh pain, and history of cocaine overdose.  He wanted to be  detoxified.   1. Right flank pain and right thigh pain.  It was initially thought      that this was secondary to his cocaine overdose as well as      rhabdomyolysis; however, renal stones were also considered.      Patient had multiple evaluations of his kidneys, including CT scans      as well as ultrasound of the kidneys, as indicated above.  He also      had radiology of the right hip and femur, which were negative.      Patient on the 9th of January indicated pain which was  in the right      lumbar paraspinal region, muscles of which were slightly tender and      taut.  The patient was provided Flexeril as well as p.r.n. IV      morphine and a K pad.  With these measures, patient's pain has      significantly improved.  Patient is not in any painful distress.      He has been noted to ambulate without any abnormality.  The patient      is stable to discharge on Flexeril for a week as well as Tylenol      p.r.n.  He is to follow up with his primary care physician.   1. Cocaine overdose:  There was some question of suicidal ideation on      admission.  Patient was consulted by psychiatry.  Please refer to      their consult notes for details.  Essentially, they have opined      that he is not at risk to harm himself or others, and they have      recommended a residential chemical dependency program of the 12-      step method.  Patient has been counseled regarding abstinence from      the substance abuse.  Patient has denied ever using intravenous      drugs or sniffing.  He says he smokes cocaine.   1. Diabetes which has been fairly controlled in the hospital.  He is      on Glucotrol as well as Avandia, which had to be continued.   1. Hypertension, which is still mildly uncontrolled with a blood      pressure systolic in the [id number].  Patient at home was on metoprolol,      which was discontinued secondary to his cocaine use, and the      patient was placed on Norvasc 10  mg as well as Catapres 0.1 mg      b.i.d.  These medications will have to be titrated as an outpatient      and if it is thought absolutely necessary and patient remains      substance free, then consider restarting his metoprolol.   1. Coronary artery disease:  Patient has not complaining of any chest      pain.  His cardiac enzymes have been negative.  Patient is to      continue his aspirin and Plavix.  1. Acute renal failure which was thought secondary to rhabdomyolysis      and  dehydration; however, his muscle enzymes were never markedly      elevated.  With hydration, patient's renal functions have      normalized.   1. Pancytopenia:  On the 8th, the patient was noted to have      pancytopenia with hemoglobin of 11.9, hematocrit 34, white cell of      2.7, and platelets of 134.  This could have been secondary to his      substance abuse and transient bone marrow suppression.  His vitamin      B12 levels have been normal.  Followup of his CBCs show improvement      with today's CBC with platelets of 160, hemoglobin 13.4, hematocrit      39, white blood cell 3.2, and a retic count of 25.  This has to be      followed up closely as an outpatient with repeat CBC and further      evaluation as deemed necessary, which may include hematology      consult, HIV testing.  The patient's most      recent metabolic panel shows a sodium of 133, potassium 3.8,      chloride 107, bicarb 23, glucose 93, BUN 22, creatinine 1.1.  This      was on March 20, 2006.  Hemoglobin A1C of 6.9.  Patient's serum      lipids were normal.  TSH was 0.835.  B12 429.  His urine toxicology      was positive for benzodiazepines and cocaine.      Vernell Leep, MD  Electronically Signed     AH/MEDQ  D:  03/21/2006  T:  03/21/2006  Job:  [id number]   cc:   Talbert Cage, M.D.   Felizardo Hoffmann, M.D.

## 2010-07-28 NOTE — Discharge Summary (Signed)
NAME:  [name], [name]               ACCOUNT NO.:  [id number]   MEDICAL RECORD NO.:  [id number]          PATIENT TYPE:  OBV   LOCATION:  4705                         FACILITY:  [facility]   PHYSICIAN:  [name]            DATE OF BIRTH:  [birth year]   DATE OF ADMISSION:  12/09/2004  DATE OF DISCHARGE:  12/10/2004                                 DISCHARGE SUMMARY   SERVICE:  Manufacturing engineer.   DISCHARGE DIAGNOSES:  1.  Congestive heart failure exacerbation.  2.  Coronary artery disease.  3.  Hypertension.  4.  Dyslipidemia.  5.  History of myocardial infarction.  6.  History of cardiac stents.  7.  Diabetes.  8.  Osteoarthritis.   DISCHARGE MEDICATIONS:  1.  Toprol-XL 25 mg daily.  2.  Zocor 20 mg q.h.s.  3.  Plavix 75 mg daily.  4.  Avandia 2 mg daily.  5.  Norvasc 5 mg daily.  6.  Glucotrol 10 mg twice a day.  7.  Multivitamin.  8.  Percocet 5/325 mg every 4 hours for pain.  9.  Aspirin 325 daily.   CONSULTATIONS:  None.   HISTORY AND PHYSICAL:  Please see dictated H&P for details, but briefly Mr.  Richardson is a 65 year old, African-American male with a history significant  for coronary artery disease, hypertension, diabetes, hyperlipidemia, and MI,  who recent had a cardiac bypass surgery approximately three weeks ago, who  presented to the ER today with worsening shortness of breath.  The patient  denied any chest pain, fevers, chills, nausea, vomiting, or cough.  He did  admit to a two-pillow orthopnea but denied PND or peripheral edema.  The  patient admitted to increasing shortness of breath once returning home  approximately a week ago, expressed that he became short of breath on lying  down flat and also with exertion.   HOSPITAL COURSE:  Again, [name] presented to the emergency room on  December 09, 2004, with the complaint of increasing shortness of breath  over the past week.  [name] had just been discharged one week prior  after  cardiac bypass surgery.  The patient stated he did have shortness of  breath at the end of this past hospitalization.  A VQ scan was done to  evaluate for PE, which showed low probability.  The patient was then  discharged to home.   ISSUES ADDRESSED DURING THIS HOSPITALIZATION:  1.  Shortness of breath:  Again, patient presented to the ER with worsening      shortness of breath.  His oxygen saturations remained above 95%      throughout his stay in the ED and in the hospital.  Patient's chest x-      ray showed worsening left lower lobe atelectasis/air space disease.      Patient's D-dimer was slightly elevated at 2, which was a decrease from      his D-dimer at discharge of last hospitalization, but to rule out PE,      patient underwent a spiral CT, which was  negative for a PE.      __________3.9, a chloride of 111, a BUN of 21, a creatinine of 1.4, a      glucose of 188.  While in the emergency room, patient received 60 IV of      Lasix and quickly put out four liters.  Also in the ED, patient started      to have more frequent PVCs.  Patient had 12 PVCs in approximately two      minutes.  Because of patient's confusing presentation with the chest x-      ray showing worsening air space disease, but patient did not appear to      have a pneumonia, he was afebrile with good SATS, he denied fevers,      chills, nausea, or vomiting, Family Practice decided to admit patient      for 23-hour OBS to monitor on a tele-bed.  Overnight, patient did well.      On December 10, 2004, patient stated he was feeling much better, that his      shortness of breath had improved, and that he felt like he could      ambulate much further, status post his Lasix.  Overnight on tele,      patient only had 10 PVCs over the course of a few hours.  Patient      continued to deny any type of chest pain.  Patient advised to use an      incentive spirometer.  Patient sent home with an incentive spirometer      and  advised to use it frequently throughout the day.  Patient told that      this could be a product of deconditioning as well as atelectasis and      that the incentive spirometer will help to open up his lungs and help      with his shortness of breath.  The suspected etiology of shortness of      breath is cardiac, suspected mild CHF exacerbation after bypass surgery      with a BNP of 524 and an improvement in shortness of breath, status post      Lasix administration.  Patient advised to keep followup appointment with      [name] on December 19, 2004.  Family Practice directs a followup      CBC with chest x-ray and echo.  2.  Anemia:  On admission, patient's hemoglobin 9.2, hematocrit of 27, with      CHF exacerbation as primary diagnosis.  He did not feel that it would be      appropriate to transfuse patient, advised patient to keep followup      appointment with [name] on December 19, 2004, for a repeat CBC.  3.  Hypertension:  Patient's Toprol-XL was increased to 50 mg daily over      hospitalization since his blood pressure had been high on admission.      His Norvasc was continued.  On discharge, patient was sent home with his      home dose of Toprol of 25 mg daily.  4.  Diabetes:  On admission, patient's glucose was 188.  Patient was put on      a diabetic and heart-healthy diet.  His Ativan and Glucotrol were      continued with sliding scale insulin.  Per patient, sugars have been      under good control.  Patient is discharged  with a home med regimen.  5.  Patient's creatinine was addressed.  Patient's creatinine on admission      was 1.4, which was an improvement since his last hospitalization one      week ago where he was discharged with a creatinine of 1.8.  Patient      received IV Lasix as well as IV contrast through his spiral CT, but his      creatinine remained stable at 1.4 and did not increase.  DISPOSITION:  To home with a followup appointment already  scheduled with Dr.  Prescott Richardson on December 19, 2004.   DISCHARGE CONDITION:  Good.   DISCHARGE INSTRUCTIONS:  Again, please follow up with [name], and  [facility] suggested a repeat CBC, chest x-ray, and echo.  Patient  advised to use incentive spirometer frequently throughout the day until  seeing [name] on the 10th.      Clifton Custard, M.D.    ______________________________  Clifton Custard    MR/MEDQ  D:  12/11/2004  T:  12/11/2004  Job:  [id number]

## 2010-07-28 NOTE — Cardiovascular Report (Signed)
NAME:  [name], [name]               ACCOUNT NO.:  [id number]   MEDICAL RECORD NO.:  [id number]          PATIENT TYPE:  INP   LOCATION:  2910                         FACILITY:  [facility]   PHYSICIAN:  [name], M.D.DATE OF BIRTH:  1945-08-17   DATE OF PROCEDURE:  06/06/2004  DATE OF DISCHARGE:                              CARDIAC CATHETERIZATION   PROCEDURE:  Retrograde central aortic catheterization, selective right and  left coronary angiography via Judkins' technique.  Pre and post internal  carotid artery nitroglycerin administration, continued Aggrastat infusion,  weight adjusted heparin, monitoring ACT's.  PTCA and subsequent 2.5/13 DES  stenting, high grade stenosis, CX OM-III, proximal, PTCA and subsequent DES  stenting 3.0/18 Cordis Cipher circumflex proximal, PTCA high grade  bifurcation, PLA, PDA distal right coronary artery stenosis, 2.5 balloon  upgraded, PTCA and subsequent DES stent high grade mid right coronary artery  stenosis.   BRIEF HISTORY:  Mr. Ohm is a 65 year old African American father of  three with two grandchildren who is divorced, lives alone and works as a  Tourist information centre manager.  He is a nonsmoker, has a history of hypertension.  He was  admitted to the hospital on June 04, 2004 with chest pain compatible with  ischemia and anterior ST segment changes compatible with possible ST segment  elevation myocardial infarction.  He was stabilized on medical therapy and  had mild enzyme elevations compatible with mild myocardial infarction.  Catheterization on June 05, 2004 demonstrated high grade proximal LAD  disease with a hazing disrupted plaque appearance segmentally in the  proximal LAD and moderate distal LAD stenosis before the bifurcation,  proximal and mid circumflex, mid and distal RCA disease.  He had global  hypokinesis with an EF of 40-45%, mid anterolateral and inferior wall motion  abnormality and collaterals to his PDA which was felt to  be an old lesion.  We felt at that time the culprit lesion predominantly based on EKG changes  was the LAD lesion.  He underwent PTCA and stenting with a 3.0 18 Cypher  stent of the LAD and on June 05, 2004 with 2B3A Aggrastat, aspirin and  Plavix and weight adjusted heparin.   He was brought back the next morning, today with stable creatinine of 1.5,  hydrated overnight and on low dose heparin with his right femoral sheath in  place.  He was given Ancef 1 gram IV.  Heparin was discontinued, on hold  through the laboratory.  ACT's were administered and the patient was given  intermittent bolus heparin throughout the procedure, a total of 6000 units.  Aggrastat infusion was continued and he was continued on aspirin and Plavix.   The previously placed side-arm sheath was sterilely changed for a new 6  French short Daguet side-arm sheath under 1% Xylocaine anesthesia.  He was  given a total of 4 mg of morphine in divided doses for sedation during the  procedure.  The left coronary was intubated with a JL 3.5 Cordis guiding  catheter and the proximal tortuous and mid circumflex OM-III lesions were  crossed with the Asahi 0.4 inch guide wire.  A 2.5/12 SM Maverick balloon  was used to dilate the OM lesion at 7-31, 8-25 and dilate the proximal  circumflex lesion at 9-31.   This was then exchanged for a 2.5/13 DES Cipher stent which was delivered  across the proximal OM branch before the distal trifurcation, deployed at  1131 and post dilated at 15-45 with good result.  That balloon was then  pulled back and used to redilate the proximal circumflex lesion at the bend.  The balloon was then exchanged for a 3.0/18 Cipher stent and the proximal  circumflex beyond OMI and crossing a very small OM-II was deployed at 10-37  and post dilated to 12-30.   Final injection showed the OM-III lesion to be reduced from 70-80% to 05  with good TIMI-III flow and good stent expansion.   The proximal  circumflex was reduced from segmental 70 and focal 95% to 0%  with good flow.  There was mild 30% narrowing concentrically beyond the  stented area around the bend but this may have been due to projection and  there was excellent flow.  There was subtotal occlusion of the very small OM-  II that was asymptomatic.  The PABG branch was intact and all the marginal  branches were intact.   The dilatation system was then removed.  We then intubated the right  coronary with an NR4, (Noto right), Cordis 6 [place] guiding catheter.  Double wires were used.  Asahi soft and a side right 0.014 inch.  These were  used to cross the PLA and then cross into the subtotally occluded PDA.   A 2.0 balloon guide Guidant Voyager was then used to dilate the PDA and then  the PLA at 8-40 and 10-31.  The balloon was upgraded to a 2.5-12 Maverick  and the PDA and PLA were both dilated independently at 5-40 and 745,  respectively.  The same balloon was used to dilate the mid RCA stenosis at  the RV branch at 5-29.   The PDA and PLA were redilated again with a 2.5-12 Maverick and the PLA  portion at 5-21.  There ws good flow through the PDA and PLA with a good  angiographic result.  Because of the small nature of these vessels and the  high take off of the PLA and previous collaterals to the PDA, we did not  feel that stenting of the PLA branch ws necessary or warranted and if that  were the case, we would probably JL the PDA which was now patent.  It was  elected then to just go ahead and stent the mid RCA 85% lesion which was  done with a 3.0 13 Cipher stent, deployed a 15-31 post dilated 16-25.   Final injection showed excellent result in the mid RCA with 85% reduced to  0.  The PDA and PLA both had spasm at the PTCA sites, IC nitroglycerin was  given and this improved the spasm significantly.  There was good TIMI-III  flow and the patient was asymptomatic and once again knowing that the patient has had prior  collaterals, we elected not to try to intervene  further in the distal RCA at this time.  He will be continued on Aggrastat  infusion for 18 hours.  Heparin will be discontinued.  He will be continued  on aspirin and Plavix, statins, beta blockers, and ACE inhibitors will be  added if creatinine is stable.   Arterial pressures were monitored throughout the procedure and ranged 140-  150 mmHg on IC nitroglycerin.  The patient tolerated the procedure well.  The side-arm sheath was flushed.  He was given 1 gram of Ancef IV in the  laboratory and will be given postoperative Ancef for one to two doses  because of the sheath change.   CATHETERIZATION DIAGNOSES:  1.  ST segment elevation myocardial infarction aborted with medical therapy      June 04, 2004.  2.  Successful culprit lesion left anterior descending DES stenting, June 05, 2004.  3.  Staged circumflex OM and proximal - mid circumflex DES stenting.  4.  Staged mid right coronary artery DES stenting and PDA, PLA double wire      technique PTCA on June 06, 2004 as outlined above.  5.  Left ventricular dysfunction.  Hopefully will improve post reperfusion.      Ejection fraction 40-45% on June 05, 2004 with segmental wall motion      abnormalities as noted.  6.  Hyperlipidemia.  7.  Systemic hypertension, normal renal arteries.  8.  History of chronic drug abuse.  Drug rehabilitation referral pending.      RAW/MEDQ  D:  06/06/2004  T:  06/06/2004  Job:  [id number]   cc:   Scarlette Ar, M.D.   Deborra Medina, M.D.  [phone]   [facility]
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
## 2010-07-28 NOTE — Discharge Summary (Signed)
NAME:  [name], [name]               ACCOUNT NO.:  [id number]   MEDICAL RECORD NO.:  [id number]          PATIENT TYPE:  INP   LOCATION:  3707                         FACILITY:  [facility]   PHYSICIAN:  [name], M.D.DATE OF BIRTH:  01-Dec-1945   DATE OF ADMISSION:  06/26/2005  DATE OF DISCHARGE:  06/29/2005                                 DISCHARGE SUMMARY   DISCHARGE DIAGNOSES:  1.  Chest pain, myocardial infarction ruled out.  2.  Known coronary disease, coronary artery bypass grafting in September of      2006 x3, with an left internal mammary artery to the left anterior      descending, saphenous vein graft of the circumflex, and saphenous vein      graft of the patent ductus arteriosus by Dr. Prescott Gum.  3.  Negative Cardiolite this admission with an ejection fraction of 31%.  4.  Noninsulin-dependent diabetes.  5.  Hypertension.  6.  Chronic renal insufficiency.   HOSPITAL COURSE:  The patient is a 65 year old male who was admitted to  [facility] by Dr. Chana Bode with a history of coronary disease as  noted above. He presented with pain in the left upper chest, which radiated  to his left shoulder. He is admitted to telemetry, started on IV heparin.  Troponin's were negative at 0.04 and 0.03. Serum creatinine was 1.8. BNP was  307. We stopped his ace inhibitor. He had a Cardiolite study done that  showed no ischemia with an ejection fraction of 31%. We feel that he can be  discharged later on the 20th. He will followup with Dr. Rollene Fare as an  outpatient and with [facility].   DISCHARGE MEDICATIONS:  1.  Coated aspirin once a day.  2.  Plavix 75 mg a day.  3.  Avandia 2 mg a day.  4.  Metoprolol 50 mg twice a day.  5.  Clonidine 0.1 mg twice a day.  6.  HCTZ 25 mg a day.  7.  Prilosec OTC once a day.  8.  Videl 1/2 tablet b.i.d.  9.  Ambien 5 mg q.h.s. p.r.n.   LABORATORY DATA:  White count 3.4. Hemoglobin and hematocrit 10.9 and 32.0.  Platelets  134,000. Sodium 138, potassium 3.9, BUN 21, creatinine 1.6. CK and  troponin's are negative x3. Cholesterol is 155, LDL 82, HDL 57. BNP was 307.  INR is 1.0.   Chest x-ray on the 19th stable cardiomegaly with no acute findings.  Cardiolite as noted. Showed no evidence of ischemia with ejection fraction  of 31%.   EKG reveals sinus rhythm and a ventricular conduction delay. Poor anterior R  wave progression.   DISPOSITION:  The patient discharged in stable condition.   FOLLOW UP:  1.  To followup with Dr. Rollene Fare in the office. At some point, we may want      to put him back on an ace inhibitor but for now, this is on hold.  2.  He has been instructed to followup with [facility] and our office will      contact him to see  Dr. Rollene Fare in a few weeks.      Erlene Quan, P.A.      [name], M.D.  Electronically Signed    LKK/MEDQ  D:  06/29/2005  T:  07/01/2005  Job:  [id number]

## 2010-07-28 NOTE — Consult Note (Signed)
NAME:  [name], [name]               ACCOUNT NO.:  [id number]   MEDICAL RECORD NO.:  [id number]          PATIENT TYPE:  INP   LOCATION:  2310                         FACILITY:  [facility]   PHYSICIAN:  Asencion Noble, M.D. LHCDATE OF BIRTH:  06-Jul-1945   DATE OF CONSULTATION:  11/18/2004  DATE OF DISCHARGE:                                   CONSULTATION   PULMONARY CONSULTATION:   DATE OF CONSULTATION:  November 18, 2004   CHIEF COMPLAINT:  Hypoxic respiratory failure status post bradycardic  arrest.   HISTORY OF PRESENT ILLNESS:  [age]-year-old African-American male was  admitted on November 12, 2004 with underlying chest pain has had a previous  history of significant substance abuse, coronary artery disease, had left-  sided pleuritic chest pain, subsequent workup showed progressive coronary  ischemia and coronary artery disease and he subsequently underwent semi-  elective bypass surgery on November 16, 2004.  Postop the patient did well  for the last several days and was about to be transferred out when this  afternoon while getting from the bed to the chair developed an episode of  severe bradycardia primarily to a heart rate of 20, noted to have elevated  pCO2, low pH, hypoxemia, chest x-ray showed moderate atelectasis in both  lower lung zones and increased lung water and we are asked to assist in the  patient's respiratory care.  He did not require intubation.  He did respond  to stimulants but still is documenting high carbon dioxide level.   PAST MEDICAL HISTORY:  Medical history of hypertension, history of type 2  diabetes poorly controlled, history of substance abuse, recent active  cocaine use, hyperlipidemia.   MEDICATIONS PRIOR TO ADMISSION:  Aspirin, Plavix, Zocor, Enalapril,  Glucotrol, Glucophage, nitroglycerin, labetalol, hydrochlorothiazide.   CURRENT MEDICATIONS INCLUDE:  Zocor, Plavix, Glipizide, aspirin, Zinacef,  vancomycin, albuterol nebs,  Lopressor, Protonix, Lantus Insulin, and Reglan.   FAMILY HISTORY:  History of hypertension and heart disease.   SOCIAL HISTORY:  He is divorced.  Tourist information centre manager, does heavy work.  Chronic cocaine use since 1970s.  Lives by himself.  Does not smoke or drink  alcohol.   PHYSICAL EXAMINATION:  This is a lethargic African-American male in no  distress.  Temperature 98, blood pressure 135/65, pulse now in the 90s,  saturation 99% on 6 L cannula.  Chest showed bilateral rales, poor air  movement.  Cardiac exam showed a regular rate and rhythm with S3, normal S1-  S2.  Abdomen was soft, nontender, protuberant.  Extremities showed no edema  or clubbing.  Skin was clear.  HEENT exam showed no jugular venous  distention or lymphadenopathy.   LABORATORY DATA:  Hemoglobin was 7.8, white count 5000.  Sodium 140,  potassium 4.2, chloride 109, CO2 26, BUN 24, creatinine 1.6, blood sugar  107, calcium 9.9, blood sugar 213.  Chest x-ray showed increasing  atelectasis and increased lung water.   IMPRESSION:  Status post bypass surgery with progressive coronary artery  disease, active cocaine use prior to this admission, progressive  atelectasis, low lung volumes, increased lung water, hypoxic respiratory  failure  with hypercarbia as well as hypoxia status post primary bradycardic  arrest doubt primary pulmonary embolism, underlying anemia with bypass  surgery, history of diabetes poorly controlled, increased hyperlipidemia.   RECOMMENDATIONS:  Add Atrovent to nebs.  Would try to diurese if able.  We  will watch renal function.  May need bilevel support.  May yet need vent.  Doubt pulmonary embolism.  Doubt spiral CT chest is needed.  Watch for  oversedation.  Pain medicines.  Followup blood gases and chest x-ray.      Asencion Noble, M.D. [facility]  Electronically Signed     PW/MEDQ  D:  11/18/2004  T:  11/18/2004  Job:  [id number]   cc:   Valentina Gu. Roxy Manns, M.D.  [address]  [place]   [place] [address]   Ivin Poot, M.D.  [address]  [place]  [place] [address]   [name], M.D.  [phone] [address], [place] [address]  Fax: [phone]

## 2010-07-28 NOTE — Cardiovascular Report (Signed)
NAME:  [name], [name]               ACCOUNT NO.:  [id number]   MEDICAL RECORD NO.:  [id number]          PATIENT TYPE:  INP   LOCATION:  2910                         FACILITY:  [facility]   PHYSICIAN:  [name], M.D.DATE OF BIRTH:  01/03/1946   DATE OF PROCEDURE:  06/05/2004  DATE OF DISCHARGE:                              CARDIAC CATHETERIZATION   PROCEDURE:  1.  Retrograde central aortic catheterization.  2.  Selective coronary angiography by Judkins technique pre and post      intracoronary nitroglycerin administration.  3.  Left ventricular angiogram RAO/LAO projection.  4.  Abdominal aortic angiogram mid stream PA projection.  5.  Aggrastat double bolus plus infusion.  6.  600 mg Plavix p.o.  7.  Continued aspirin.  8.  Weight adjusted heparin.  9.  Pre dilatation 3.0 mm balloon and subsequent 3.0 x 18 CYPHER Cordis DES      stent high grade disrupted proximal left anterior descending stenosis.   PROCEDURE:  Patient was brought to second floor CP Laboratory in  postabsorptive state after 5 mg Valium p.o. pre medication.  The right groin  was prepped, draped in the usual manner.  1% Xylocaine was used for local  anesthesia.  Heparin was held on-call to the laboratory.  Patient was on IV  nitroglycerin.  Xylocaine 1% was used for local anesthesia and the CFRA was  entered with a single anterior puncture using 18 thin wall needle and a 6-  [place] short __________ side arm sheath were inserted without difficulty.  Diagnostic coronary angiography was done with 6-French 4 cm taper preformed  Cordis left coronary catheter.  A no-torque 6-French right coronary catheter  was used for selective right coronary angiography.  LV angiogram was done in  the RAO and LAO projection 25 mL 14 mL per second and 20 mL 12 mL per  second.  Pullback pressure of the CA was performed that showed no gradient  across the aortic valve.  IC nitroglycerin was given with repeat left  coronary  injections obtained.  Abdominal aortic angiogram was done in the  mid stream PA projection at 25 mL 20 mL per second with visualization to the  SFA profunda junction bilaterally.  This revealed normal single renal  arteries bilaterally.  No significant infrarenal atherosclerotic disease or  aneurysm or stenosis with a tortuous abdominal aorta and iliacs.  The  iliacs, common and external, had no significant stenosis.  The hypogastrics  were intact bilaterally and SFA profunda junctions appeared normal  bilaterally.   Catheter was removed.  Side arm sheath was flushed pending review of his  angiograms.   LV angiogram demonstrated hypokinesis of the mid anterolateral wall and the  septal apical area.  There was hypo/akinesis of the proximal third of the  inferior wall.  There was overall global hypokinesis with EF of 40-45%.  There was no significant mitral regurgitation.   PRESSURES:  LV 180/0.  LVEDP 18-24 mmHg.   CA 180/95 mmHg.  There was no gradient across the aortic valve on catheter  pullback.   Fluoroscopy did not reveal any significant left  coronary, +1 right coronary  calcification.  No significant intracardiac or valvular calcification.   The main left coronary was short, but normal.   The LAD had tapering and segmental stenosis between the first two septal  perforator branches and before a small first diagonal branch from the  junction of the proximal third of the LAD.  In multiple views it appeared to  be hazy with decreased dye density and in the LAO view appeared to be high  grade and possibly a shelf-like lesion.  It was felt that this was  compatible with disrupted plaque.   The distal LAD had segmental 85-90% stenosis just before the apical  bifurcation.  The small diagonal had no significant disease and bifurcated.   There was a thin, long, moderate sized first marginal rising proximally that  had no significant stenosis.   There was +2 to +1 calcific  eccentric stenosis of the proximal circumflex  between OM1 and the PABG branch which was large and not diseased.  There was  70% segmental disease and focal 95% stenosis before the small OM2 that  bifurcated.   There was then 70-80% segmental stenosis of the mid circumflex before the  trifurcating distal marginal branch two of which were large.  There were  faint collaterals to the PDA from the circumflex.   The right coronary was a moderate sized vessel with 85% stenosis in the mid  portion, 90% stenosis before the PDA/PLA bifurcations focally, and subtotal  to total stenosis of the PDA with some antegrade flow to the PDA.  The PLA  was moderate size and jeopardized by the stenoses.   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  He has also  had a history of recreational drug use.  He lives alone, is nonsmoker, and  has had no significant prior medical history except for hypertension and has  been on Norvasc 10 mg for that.  He was admitted on June 04, 2004 with  substernal chest pain radiating to the left jaw compatible with ischemia  associated with shortness of breath, nausea, and diaphoresis.  He had  elevated CPKs, borderline MBs, and borderline troponin elevation.  He did,  however, have ST elevation in the anterior precordial leads without any  definite evolutionary T-wave changes to date.  He has had recurrent chest  pain in the hospital on IV nitroglycerin and it was felt best to proceed  with diagnostic angiography in this setting of possible ST versus non ST  SEMI.  Informed consent was obtained to proceed.   Diagnostic angiography revealed hazy area of the LAD in the proximal third  with tapering and moderate diffuse disease estimated 80-85% stenosis best  seen in the LAO projection.  There was also disease distally segmentally near the apex with associated wall motion  abnormality.  He has high grade  circumflex disease as well and high grade mid right and subtotal/total PDA  which appears old.  He does have collaterals from the left coronary system  to the PDA so this is protected but the PLA is jeopardized.  It is difficult  to tell what his culprit lesion but based on his EKG changes I think it is  probably his LAD which has the appearance of a possible ruptured plaque.  It  was elected to proceed with PCI in this setting.  The patient was agreeable  to this.   ACTs were monitored.  He was given weight adjusted heparin of 4000 units.  He was given double bolus Aggrastat plus infusion and 600 mg of Plavix.  Aspirin was continued and increased from baby aspirin to full dose 325 mg.   The left coronary was intubated with a JL3.5 6-French Cordis side hole  guiding catheter.  The LAD lesion was crossed with a 0.014 inch Asahi wire  which was free in the distal LAD.  The LAD was then dilated with a 3.0 x 15  balloon at low pressure of 5 atmospheres for 39 seconds.  The balloon was  then replaced with a 3.0 x 18 Cordis DES CYPHER stent.  This was positioned  fluoroscopically between the two septal perforator branches and before the  small diagonal and deployed at 10 x 26 post dilated at 16/29.  The balloon  was removed.  There appeared to be streaming because of the side hole guide  and it appeared that the stent was well positioned and fully deployed.  For  this reason dilatation system was removed and replaced with a 6-French non-  side hole diagnostic left coronary catheter and repeat injections were  obtained showing a position of stent that was well deployed and good TIMI 3  flow to the LAD.  The side branch diagonal was intact.  Dilatation system  was removed.  Final ACT was 324 seconds.  Side arm sheath was flushed and  secured to the skin to prevent migration.  The patient was transferred to  the holding area for post PTCA follow-up, side arm sheath  removal when ACT  normalizes.  He is a candidate for a staged PCI of his circumflex and mid  right and possible intervention for his distal right, particularly the PLA  since the PDA already has good collaterals.   CATHETERIZATION DIAGNOSES:  1.  Atherosclerotic heart disease.  Acute coronary syndrome with anterior ST      segment elevation, anterior wall motion abnormality, and minimal enzyme      elevation.  2.  Three vessel disease as outlined above.      1.  High grade proximal left anterior descending, disrupted hazy lesion.      2.  High grade segmental proximal and mid circumflex disease.      3.  High grade mid right and distal right and subtotal posterior          descending artery with collaterals.  3.  Global hypokinesis with segmental wall motion abnormalities, ejection      fraction 40-45%.  4.  Systemic hypertension with normal renal arteries.  5.  Cholesterol status unknown.  6.  Nonsmoker. 7.  Drug abuse syndrome as outlined in the chart.      RAW/MEDQ  D:  06/05/2004  T:  06/05/2004  Job:  [id number]   cc:   CP Lab
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
## 2010-07-28 NOTE — Cardiovascular Report (Signed)
[name], Richardson NO.:  [id number]   MEDICAL RECORD NO.:  [id number]          PATIENT TYPE:  INP   LOCATION:  2904                         FACILITY:  [facility]   PHYSICIAN:  Eden Lathe. Einar Gip, MD       DATE OF BIRTH:  [birth year]   DATE OF PROCEDURE:  11/14/2004  DATE OF DISCHARGE:                              CARDIAC CATHETERIZATION   ATTENDING CARDIOLOGIST:  Delfino Lovett A. Rollene Fare, M.D.   PRIMARY CARE PHYSICIAN:  [facility].   PROCEDURE:  1.  Left ventriculography.  2.  Left and right heart coronary angiography.  3.  Intracoronary nitroglycerin administration.   INDICATION:  [name] is a 65 year old gentleman with a history of known  coronary artery disease, status post angioplasty and stenting to the mid LAD  on June 05, 2004, with a 3.0 x 18-mm CYPHER. He has a known distal LAD 80%  stenosis at the apex. He has two stents in his mid circumflex and mid to  distal circumflex of 3.0 x 18 and a 2.5 x 13-mm CYPHER placed on June 06, 2004, and a CYPHER placed in the proximal to mid segment of the RCA with a  3.0 x 13-mm CYPHER on June 06, 2004, and a kissing balloon angioplasty in  the distal RCA at  the same time, presented to the hospital complaining of  recurrent chest discomfort after using cocaine. Because of his known  significant coronary artery disease and known stenting in multivessel  segments, after ruling out myocardial infarction because of positive  troponins, he was brought to the cardiac catheterization lab and evaluated  his coronary anatomy.   HEMODYNAMIC DATA:  The left ventricular pressure was 182/4 with end-  diastolic pressure of 22 mmHg. The aortic pressures was 173/90. There was no  pressure gradient across the aortic valve.   ANGIOGRAPHIC DATA:  The left ventricular systolic function was estimated at  60%. There was no significant mitral regurgitation. There was on wall motion  abnormality detected with hand contrast  injection.   The RCA is a dominant vessel. It is a large size vessel in the proximal  segment. The previously placed stent was widely patent in the proximal to  the mid segment; however the mid to distal segment has a 90% focal stenosis  and the distal RCA at the site of kissing balloon angioplasty has a 99% re-  stenosis. The PDA is now occluded and the PLA has an ostial 99% stenosis.   The left main has severe 70% to 80% stenosis with severe damping with a 6-  [place] JL4 catheter with no change with intracoronary nitroglycerin  administration. The distal left main appears to be widely patent.   The circumflex is a large caliber vessel. It is smooth and the previously  placed mid circumflex and mid to distal circumflex stents are widely patent.  These are 3.0 x 18  and a 2.5 x 13-mm CYPHER stent. The obtuse marginal-1  off the circumflex is also widely patent.   The LAD is a large caliber vessel. The stent in the mid LAD placed on  June 05, 2004, which is a 3.0 x 18-mm CYPHER is widely patent. It gives rise to  two small diagonals. The distal LAD is diffusely diseased and has a distal  palpable 80% stenosis. However, this was not well visualized at the apex.   The left subclavian and LIMA were widely patent.   IMPRESSION:  1.  Normal left ventricular systolic function, ejection fraction 60%. This      appears to have improved from prior 45% in March 2006.  2.  Widely patent stents in the right coronary artery, circumflex, and left      anterior descending. However, there is severe progression of disease in      the distal RCA, both in the mid segment which is native and distal RCA,      which had previous balloon angioplasty site, has now severely re-      stenosed with high-grade stenosis. The PDA is now occluded and the PLA      has an ostial 99% stenosis.  3.  The left main has an ostial 70% to 80% stenosis. No change with      intracoronary nitroglycerin administration.    RECOMMENDATIONS:  Given his coronary anatomy, the patient will need coronary  artery bypass grafting. Discharge modification including abstinence from  cocaine, aggressive control of diabetes and hyperlipidemia is indicated.   A total of 80 mL of Visipaque was used for diagnostic angiography. The LIMA  was widely patent.   TECHNIQUE OF THE PROCEDURE:  A 6-French MPB-2 catheter was utilized to  perform left ventriculostomy both in the LAO and RAO projection with hand  contrast injection. The left main coronary artery was initially engaged with  multiple B2 catheter, but because of suspicion for high-grade stenosis and  streaming as injection was not performed rapidly, I decided to change with a  6-French JL-4 diagnostic catheter. Again, there was severe damping and,  hence, very gentle injection was performed. A 6-French JR-4 diagnostic  catheter was utilized to engage the right coronary artery. Angiography was  done. In the left main, 200 mcg of intracoronary nitroglycerin was also  administered. The JR-4 catheter was utilized to engage the left subclavian  artery and the left subclavian arteriogram with visualization of the LIMA  was performed. Then the catheter was pulled from the body in the usual  fashion. The patient tolerated the procedure. No immediate complication  noted.      Eden Lathe. Einar Gip, MD  Electronically Signed     JRG/MEDQ  D:  11/14/2004  T:  11/14/2004  Job:  [id number]   cc:   Delfino Lovett A. Rollene Fare, M.D.  [phone], [place] [address]  Fax: [phone]
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
## 2010-07-28 NOTE — Op Note (Signed)
NAME:  [name], CHEAK NO.:  [id number]   MEDICAL RECORD NO.:  [id number]          PATIENT TYPE:  INP   LOCATION:  2310                         FACILITY:  [facility]   PHYSICIAN:  Ivin Poot, M.D.  DATE OF BIRTH:  07/24/1945   DATE OF PROCEDURE:  11/15/2004  DATE OF DISCHARGE:                                 OPERATIVE REPORT   OPERATION:  Coronary bypass grafting x 3 (left internal mammary artery LAD,  saphenous vein graft to circumflex marginal, saphenous vein graft to  posterior descending).   PRE- AND POSTOPERATIVE DIAGNOSIS:  Class IV unstable angina with severe left  main stenosis and three-vessel coronary disease.   SURGEON:  Ivin Poot, M.D.   ASSISTANT:  Lanelle Bal, MD and Jacinta Shoe, P.A.   ANESTHESIA:  General by Dr. Oren Bracket   INDICATIONS:  The patient is a 65 year old black male with known history of  coronary disease who presented with symptoms of unstable angina. He ruled  out for MI and cardiac catheterization demonstrated an 80 to 90% ostial left  main stenosis with high-grade stenosis of the right coronary in a diabetic  pattern. The patient has a history of previous PCI with stent placements to  the proximal LAD, circumflex and right coronary artery June 10, 2004. He has  been on Plavix chronically. Due to the patient's left main stenosis, it was  felt that surgical evaluation was the best long-term option.   OPERATIVE FINDINGS:  The vein was harvested endoscopically from the left  thigh and was of adequate quality. The mammary artery was a good vessel with  excellent flow. The vessels were severely diffusely diseased in a diabetic  pattern. The patient did not require any blood transfusions. He was given  the aprotinin protocol because of the use of preoperative Plavix. The  posterior descending was a poor target.   PROCEDURE:  The patient was brought to operating room and placed supine on  the operating table.   General anesthesia was induced under invasive  hemodynamic monitoring. The chest, abdomen and legs were prepped with  Betadine and draped as a sterile field. A sternal incision was made as the  saphenous vein was harvested endoscopically from the left leg. The internal  mammary artery was harvested as a pedicle graft from its origin at the  subclavian vessels. Heparin was administered and ACT was documented as being  therapeutic. The sternal retractor was placed and the pericardium opened and  suspended. Pursestrings were placed in the ascending aorta and right atrium  and the patient was cannulated placed on bypass. The coronaries were  identified for grafting and the mammary artery and vein grafts were prepared  for the distal anastomoses. Cardioplegia catheters were placed for both  antegrade aortic and retrograde coronary sinus cardioplegia. The patient was  cooled to 32 degrees. Aortic crossclamp was applied. 800 mL of cold blood  cardioplegia was delivered in split doses between the antegrade aortic and  retrograde coronary sinus catheters. There is good cardioplegic arrest and  septal temperature dropped less than 14 degrees. Topical iced saline was  used to augment myocardial preservation and a pericardial insulator pad was  used to protect the left phrenic nerve.   The distal coronary anastomoses were performed. First distal anastomosis was  the posterior descending. This was a heavily diseased small 1 mm vessel.  Reverse saphenous vein was sewn end-to-side with running 7-0 Prolene.  There  was adequate flow through the graft. The second distal anastomosis was the  circumflex marginal. There is a large intramyocardial 1.7 mm vessel with  proximal 80 to 90% stenosis. A reverse saphenous vein was sewn end-to-side  with running 7-0 Prolene.  There was good flow through graft. Cardioplegia  was redosed. The third distal anastomosis was to the distal third of the  LAD. The LAD was  intramyocardial more proximally. At the site of the  anastomosis, the LAD was approximately 1.5 mm in diameter. Left IMA pedicle  was brought through an opening created in the left lateral pericardium and  was brought down onto the LAD and sewn end-to-side with running 8-0 Prolene.  There is excellent flow through the anastomosis after briefly releasing the  pedicle bulldog on the mammary pedicle. The bulldog clamp was reapplied and  the pedicle was secured to the epicardium.   Cardioplegia was redosed. While the crossclamp was still in place, two  proximal vein anastomoses were placed on the ascending aorta using a 4.4 mm  punch with running 6-0 Prolene. Prior to tying down the final proximal  anastomosis, air was vented from the coronaries and left side of the heart  using a dose of retrograde warm blood cardioplegia and the usual de-airing  maneuvers on bypass. The crossclamp was then removed. The heart was  cardioverted back to a regular rhythm. The patient was rewarmed and  reperfused. Air was aspirated from the vein grafts with 27 gauge needle. The  bypass grafts were opened and each had good flow and hemostasis was  documented at the proximal and distal anastomoses. The patient was rewarmed  to 37 degrees and temporary pacing wires were applied. Lungs re-expanded and  the ventilator was resumed. The patient was then weaned from bypass without  inotropes without difficulty. Blood pressure and cardiac output were stable.  Protamine was administered without adverse reaction. The cannulas were  removed. The mediastinum was irrigated warm antibiotic irrigation. The leg  incision was irrigated and closed in a standard fashion. The superior  pericardial fat was closed over the aorta and vein grafts. Two mediastinal  and left pleural chest tube were placed and brought out through separate  incisions. The sternum was closed with interrupted steel wire. The pectoralis fascia was closed in  running #1 Vicryl. Subcutaneous and skin  layers were closed in running Vicryl and sterile dressings were applied.  Total bypass time was 130 minutes, crossclamp time of 80 minutes.      Ivin Poot, M.D.  Electronically Signed     PV/MEDQ  D:  11/16/2004  T:  11/16/2004  Job:  [id number]   cc:   CVTS ofc   [name], M.D.  [phone] [address] [place] [address]  Fax: [phone]
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
## 2010-07-28 NOTE — Consult Note (Signed)
NAME:  [name], [name]               ACCOUNT NO.:  [id number]   MEDICAL RECORD NO.:  [id number]          PATIENT TYPE:  INP   LOCATION:  1403                         FACILITY:  [facility]   PHYSICIAN:  Felizardo Hoffmann, M.D.  DATE OF BIRTH:  [birth year]   DATE OF CONSULTATION:  03/20/2006  DATE OF DISCHARGE:                                 CONSULTATION   REQUESTING PHYSICIAN:  Hind I Elsaid, MD   REASON FOR CONSULTATION:  Cocaine dependence   HISTORY:  [name] relapsed on cocaine this past month.  He has  been using approximately $3000 worth of street-value crack cocaine.  He  has been free basing it.  He has not had any thoughts of harming himself  or others.  He does state that he has become progressively paranoid with  each time of cocaine use.  However, when he is no longer intoxicated; he  is no longer paranoid.  His interests are within normal limits.  His  hope is intact his concentration and energy are within normal limits   PAST PSYCHIATRIC HISTORY:  The patient does have a history of multiple  cocaine rehabilitation programs.  There is no known history of major  depression.  He does not have any history of suicide attempts.  He has  never been treated with psychotropic medications that he knows of.  There is no history of primary psychotic disorder.  He drinks alcohol  occasionally.  He does not do any other illegal drugs.   FAMILY PSYCHIATRIC HISTORY:  None known.   SOCIAL HISTORY:  Divorced, lives alone, is a back Radio producer.  He has  three children.   GENERAL MEDICAL PROBLEMS:  CAD, diabetes mellitus, hypertension, and  chronic renal failure.   MEDICATIONS:  His MAR is reviewed.  The patient is not on any current  psychotropics, other than that __________ 1 mg q.4 h. P.r.n.   ALLERGIES:  He has no known drug allergies   LABORATORY DATA:  WBC 2.7, hemoglobin 11.9, platelets 134, INR 1.1.  Metabolic panel shows the BUN 32, creatinine 1.6, SGOT 16, SGPT 16,  magnesium 2.4.  TSH is within normal limits.  Urine drug screen is  positive for benzodiazepines and cocaine.   ALCOHOL:  Negative   REVIEW OF SYSTEMS:  Noncontributory.   PHYSICAL EXAMINATION:  VITAL SIGNS:  Temperature 97.7, pulse 60,  respiration 20, blood pressure 116/70, O2 saturation on room air 97%.   MENTAL STATUS EXAM:  [name] is a middle-aged male appearing his  chronological age, lying in a supine position in his hospital bed with  good eye contact; and his behavior is socially appropriate.  He is well  groomed.  His speech is normal with normal rate and prosody.  His fund  of knowledge and intelligence are within normal limits.  He is oriented  to all spheres.  His memory is intact to immediate, recent, and remote.  His thought process is logical, coherent, and goal-directed.  No  looseness of associations.  Thought content:  No thoughts of harming  himself.  No thoughts of harming others no delusions,  no hallucinations  His memory is intact to immediate recent remote.  He is oriented to all  spheres.  Judgment is intact.  Insight is partial.  Concentration is  within normal limits.  His affect is slightly flat.  His mood is within  normal limits.   ASSESSMENT:  AXIS I:  Cocaine dependence.  AXIS II:  Deferred.  AXIS III:  See general medical problems.  AXIS IV:  Primary support group general medical.  AXIS V:  55.   IMPRESSION:  [name] is not at risk to harm himself or others.  He  agrees to call emergency services immediately for any thoughts of  harming himself, thoughts of harming others, or distress.   The undersigned provided ego supportive psychotherapy and a  reinforcement of the 12-step approach.   RECOMMENDATIONS:  1. Once medically cleared, would ask the case manager to obtain      information on available residential chemical dependency programs      for this patient.  2. No psychotropics required  3. The 12-step method.      Felizardo Hoffmann, M.D.  Electronically Signed     JW/MEDQ  D:  03/20/2006  T:  03/20/2006  Job:  [id number]

## 2010-07-28 NOTE — Discharge Summary (Signed)
[name], [name] NO.:  [id number]   MEDICAL RECORD NO.:  [id number]          PATIENT TYPE:  INP   LOCATION:  [id number]                         FACILITY:  [facility]   PHYSICIAN:  Adella Hare, M.D.      DATE OF BIRTH:  12/24/1945   DATE OF ADMISSION:  06/04/2004  DATE OF DISCHARGE:  06/10/2004                                 DISCHARGE SUMMARY   CONSULT OBTAINED:  [name], M.D., [name].   PRIMARY CARE PHYSICIAN:  [facility].   DISCHARGE DIAGNOSES:  1.  ST segment elevation myocardial infarction, status post drug eluting      stent in the left anterior descending, the circumflex as well as the      right coronary artery.  2.  Left ventricular dysfunction with ejection fraction of 40 to 45% per      catheterization on June 05, 2004, with segmental wall motion      abnormalities.  3.  Diabetes mellitus with hemoglobin A1c of 8.2.  4.  Hypertension.  5.  History of chronic drug abuse.   DISCHARGE MEDICATIONS:  1.  Aspirin 150 mg p.o. daily.  2.  Plavix 75 mg p.o. daily.  3.  Zocor 40 mg p.o. daily.  4.  Enalapril 10 mg p.o. daily.  5.  Glucotrol 10 mg p.o. b.i.d.  6.  Glucophage 500 mg p.o. b.i.d.  7.  Nitroglycerin 0.4 mg q.5 minutes x3 p.r.n. chest pain.  8.  Labetalol 300 mg p.o. b.i.d.   DISPOSITION AND FOLLOW-UP:  The patient will follow up at [facility] on  June 14, 2004, at 10 a.m.  Please do the following:  1.  Check CBG and follow blood sugars.  Metformin was added to the Glucotrol      during hospital admission.  Also note hemoglobin A1c was 8.1.  2.  Please measure BMET because of addition of Metformin to his regimen and      patient does have creatinine of 1.4 which needs close monitoring since      patient was started on Metformin.  3.  Please follow up on blood pressure.  His Labetalol was increased to 300      mg p.o. b.i.d. during hospitalization and Enalapril was continued 10 mg      p.o. daily.   Hydrochlorothiazide can be added as an outpatient as a      third drug, depending on his blood pressure since his goal blood      pressure is 130/80 since he is a diabetic.   Patient also has an appointment with [name] on July 04, 2004, at  3:45 p.m.   PROCEDURE:  Catheterization performed on June 05, 2004, for which he  underwent DES stenting of the left anterior descending, circumflex as well  as the right coronary artery.  Also there was left ventricular dysfunction  with EF of 40 to 45% with segmental wall motion abnormalities.  He had  normal renal arteries on this catheterization.   ADMISSION HISTORY AND PHYSICAL:  [name] is a 65 year old African  American male with past medical history  significant for hypertension and  cocaine use who came via EMS for complaints of chest pain after a binge of  cocaine.  He graded his chest pain as a 9/10 which is substernal and also  radiating to the left jaw.  This pain was associated with shortness of  breath as well as nausea and diaphoresis.   GENERAL APPEARANCE:  Lethargic but arousable in mild distress secondary to  the chest pain.  VITAL SIGNS:  On admission, pulse 71, blood pressure 167/89, temperature  97.6, respirations 16, O2 saturation 97% on room air.  LUNGS:  Clear to auscultation bilaterally, no wheezing, rhonchi or rales.  CARDIOVASCULAR:  Regular rate and rhythm with no murmurs, rubs, or gallops.  ABDOMEN:  Soft, nontender, nondistended with positive bowel sounds.  EXTREMITIES:  No clubbing, cyanosis, or edema.   ADMISSION LABORATORY DATA:  Sodium 137, potassium 3.6, chloride 108, bicarb  26, BUN 26, creatinine 1.6, glucose 108.  Hemoglobin is 13, white count 3.2,  platelets 158.  LFTs were within normal limits.  UDS was positive for  cocaine as well as opiates.   EKG showed ST segment elevation in V1 to V3 and ST depression in V5-V6.  Point of care markers were positive.   HOSPITAL COURSE:  PROBLEM #1 -   CHEST PAIN SECONDARY TO ST SEGMENT ELEVATION  MYOCARDIAL INFARCTION:  This was evident per EKG changes as well as elevated  cardiac enzymes.  The peak bump in troponin  was 0.37 with __________ bump  of CK being 6.1.  Richardson was consulted and [name] assessed the  patient.  He underwent a catheterization on June 05, 2004, which showed  multiple blockages for which DES stents were placed in the left anterior  descending, circumflex as well as the right coronary artery.  He was  continued on aspirin, Plavix, Zocor, Enalapril and Labetalol.  The patient  did not have any chest pain for two days prior to hospital discharge.  He  also received cardiac  rehabilitation during hospital stay.  Also attempts  were made for him to receive cardiac rehabilitation at a discount since he  cannot afford the routine cost of $64 for cardiac rehabilitation.  He has  been given a prescription for sublingual nitroglycerin if he were to  experience chest pain and has been told to call 911 promptly if chest pain  does not resolve with nitroglycerin.  He has a follow-up appointment with  [name] on July 04, 2004.   PROBLEM #2 -  HYPERTENSION:  This was an issue during hospital stay.  His  Labetalol was sequentially increased from 100 mg p.o. b.i.d. to 300 mg p.o.  b.i.d.  His Enalapril was continued at 10 mg p.o. daily. This can be further  titrated as an outpatient and hydrochlorothiazide can be added as a third  regimen for better control of his blood pressure.  Also note, that Norvasc  was added during hospital admission at 5 mg p.o. daily.   PROBLEM #3 -  DIABETES MELLITUS:  Hemoglobin A1c was 8.2 indicating poor  control. He was initially on Glipizide 10 mg p.o. b.i.d.  Metformin was  added one day prior to admission at a dose of 500 mg p.o. b.i.d.  A BMET is  pending.  If his creatinine is stable, he will be discharged today.  Again, this can be titrated as an outpatient and a follow-up  BMET will be obtained  on June 14, 2004, to follow up on the creatinine.  PROBLEM #4 -  HYPERLIPIDEMIA:  Patient had a fasting lipid panel drawn  during hospitalization stay which was as follows:  cholesterol 113,  triglycerides 70, HDL 50, LDL 49.  He will continue taking Zocor 40 mg p.o.  daily.  He will obtain a CMET as well as fasting lipid panel in six weeks.   PROBLEM #5 -  POLYSUBSTANCE ABUSE:  Patient does admit to using cocaine.  He  has been strongly advised not to continue using cocaine.  He was also placed  on Labetalol because he has been using cocaine.  Case management was  consulted and gave him information regarding detoxification.   Discharge vitals:  Temperature 97.8, pulse 53, respirations 24, blood  pressure 160/86, sating 99% on room air.      BP/MEDQ  D:  06/10/2004  T:  06/11/2004  Job:  [id number]   cc:   Delfino Lovett A. Rollene Richardson, M.D.  [phone] [address], [place] [address]  Fax: [phone]   [facility]
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
## 2010-08-25 ENCOUNTER — Inpatient Hospital Stay (HOSPITAL_COMMUNITY)
Admission: EM | Admit: 2010-08-25 | Discharge: 2010-08-27 | DRG: 293 | Disposition: A | Discharge status: Home / Self Care | Payer: Medicare Other | Attending: Internal Medicine | Admitting: Internal Medicine

## 2010-08-25 ENCOUNTER — Emergency Department (HOSPITAL_COMMUNITY): Payer: Medicare Other

## 2010-08-25 ENCOUNTER — Encounter: Payer: Self-pay | Admitting: Internal Medicine

## 2010-08-25 DIAGNOSIS — I129 Hypertensive chronic kidney disease with stage 1 through stage 4 chronic kidney disease, or unspecified chronic kidney disease: Secondary | ICD-10-CM | POA: Diagnosis present

## 2010-08-25 DIAGNOSIS — Z951 Presence of aortocoronary bypass graft: Secondary | ICD-10-CM

## 2010-08-25 DIAGNOSIS — Z9119 Patient's noncompliance with other medical treatment and regimen: Secondary | ICD-10-CM

## 2010-08-25 DIAGNOSIS — N183 Chronic kidney disease, stage 3 unspecified: Secondary | ICD-10-CM | POA: Diagnosis present

## 2010-08-25 DIAGNOSIS — I509 Heart failure, unspecified: Secondary | ICD-10-CM | POA: Diagnosis present

## 2010-08-25 DIAGNOSIS — E785 Hyperlipidemia, unspecified: Secondary | ICD-10-CM | POA: Diagnosis present

## 2010-08-25 DIAGNOSIS — Z7902 Long term (current) use of antithrombotics/antiplatelets: Secondary | ICD-10-CM

## 2010-08-25 DIAGNOSIS — Z794 Long term (current) use of insulin: Secondary | ICD-10-CM

## 2010-08-25 DIAGNOSIS — I5033 Acute on chronic diastolic (congestive) heart failure: Principal | ICD-10-CM | POA: Diagnosis present

## 2010-08-25 DIAGNOSIS — F1410 Cocaine abuse, uncomplicated: Secondary | ICD-10-CM | POA: Diagnosis present

## 2010-08-25 DIAGNOSIS — Z7982 Long term (current) use of aspirin: Secondary | ICD-10-CM

## 2010-08-25 DIAGNOSIS — E119 Type 2 diabetes mellitus without complications: Secondary | ICD-10-CM | POA: Diagnosis present

## 2010-08-25 DIAGNOSIS — I2510 Atherosclerotic heart disease of native coronary artery without angina pectoris: Secondary | ICD-10-CM | POA: Diagnosis present

## 2010-08-25 DIAGNOSIS — I2589 Other forms of chronic ischemic heart disease: Secondary | ICD-10-CM | POA: Diagnosis present

## 2010-08-25 DIAGNOSIS — Z91199 Patient's noncompliance with other medical treatment and regimen due to unspecified reason: Secondary | ICD-10-CM

## 2010-08-25 DIAGNOSIS — F1210 Cannabis abuse, uncomplicated: Secondary | ICD-10-CM | POA: Diagnosis present

## 2010-08-25 DIAGNOSIS — M1990 Unspecified osteoarthritis, unspecified site: Secondary | ICD-10-CM | POA: Diagnosis present

## 2010-08-25 DIAGNOSIS — M542 Cervicalgia: Secondary | ICD-10-CM | POA: Diagnosis present

## 2010-08-25 DIAGNOSIS — M549 Dorsalgia, unspecified: Secondary | ICD-10-CM | POA: Diagnosis present

## 2010-08-25 LAB — GLUCOSE, CAPILLARY
Glucose-Capillary: 228 mg/dL — ABNORMAL HIGH (ref 70–99)
Glucose-Capillary: 169 mg/dL — ABNORMAL HIGH (ref 70–99)

## 2010-08-25 LAB — POCT I-STAT, CHEM 8
Creatinine, Ser: 2.2 mg/dL — ABNORMAL HIGH (ref 0.50–1.35)
Hemoglobin: 13.3 g/dL (ref 13.0–17.0)
Sodium: 142 mEq/L (ref 135–145)
TCO2: 24 mmol/L (ref 0–100)

## 2010-08-25 LAB — RAPID URINE DRUG SCREEN, HOSP PERFORMED
Amphetamines: NOT DETECTED
Barbiturates: NOT DETECTED
Tetrahydrocannabinol: POSITIVE — AB

## 2010-08-25 LAB — COMPREHENSIVE METABOLIC PANEL
ALT: 8 U/L (ref 0–53)
Alkaline Phosphatase: 152 U/L — ABNORMAL HIGH (ref 39–117)
CO2: 26 mEq/L (ref 19–32)
Calcium: 10.4 mg/dL (ref 8.4–10.5)
Chloride: 107 mEq/L (ref 96–112)
GFR calc Af Amer: 42 mL/min — ABNORMAL LOW (ref >60)
GFR calc non Af Amer: 34 mL/min — ABNORMAL LOW (ref >60)
Glucose, Bld: 133 mg/dL — ABNORMAL HIGH (ref 70–99)
Sodium: 141 mEq/L (ref 135–145)
Total Bilirubin: 0.2 mg/dL — ABNORMAL LOW (ref 0.3–1.2)

## 2010-08-25 LAB — SODIUM, URINE, RANDOM: Sodium, Ur: 90 mEq/L

## 2010-08-25 LAB — HEMOGLOBIN A1C: Mean Plasma Glucose: 160 mg/dL — ABNORMAL HIGH (ref <117)

## 2010-08-25 LAB — CARDIAC PANEL(CRET KIN+CKTOT+MB+TROPI): Total CK: 53 U/L (ref 7–232)

## 2010-08-25 LAB — CK TOTAL AND CKMB (NOT AT ARMC)
CK, MB: 1.5 ng/mL (ref 0.3–4.0)
Total CK: 61 U/L (ref 7–232)
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
## 2010-08-26 DIAGNOSIS — I5021 Acute systolic (congestive) heart failure: Secondary | ICD-10-CM

## 2010-08-26 DIAGNOSIS — I509 Heart failure, unspecified: Secondary | ICD-10-CM

## 2010-08-26 LAB — GLUCOSE, CAPILLARY
Glucose-Capillary: 133 mg/dL — ABNORMAL HIGH (ref 70–99)
Glucose-Capillary: 167 mg/dL — ABNORMAL HIGH (ref 70–99)

## 2010-08-26 LAB — BASIC METABOLIC PANEL
CO2: 25 mEq/L (ref 19–32)
Glucose, Bld: 125 mg/dL — ABNORMAL HIGH (ref 70–99)
Potassium: 3.5 mEq/L (ref 3.5–5.1)
Sodium: 136 mEq/L (ref 135–145)

## 2010-08-26 LAB — CBC
Hemoglobin: 12.7 g/dL — ABNORMAL LOW (ref 13.0–17.0)
Platelets: 156 10*3/uL (ref 150–400)
RBC: 4.17 MIL/uL — ABNORMAL LOW (ref 4.22–5.81)
WBC: 2.8 10*3/uL — ABNORMAL LOW (ref 4.0–10.5)

## 2010-08-26 LAB — CARDIAC PANEL(CRET KIN+CKTOT+MB+TROPI)
CK, MB: 1.4 ng/mL (ref 0.3–4.0)
Total CK: 62 U/L (ref 7–232)
Troponin I: <0.3 ng/mL (ref <0.30)

## 2010-08-27 LAB — GLUCOSE, CAPILLARY
Glucose-Capillary: 139 mg/dL — ABNORMAL HIGH (ref 70–99)
Glucose-Capillary: 182 mg/dL — ABNORMAL HIGH (ref 70–99)

## 2010-08-27 LAB — CBC
HCT: 35.8 % — ABNORMAL LOW (ref 39.0–52.0)
Hemoglobin: 12.2 g/dL — ABNORMAL LOW (ref 13.0–17.0)
MCH: 30.4 pg (ref 26.0–34.0)
RBC: 4.01 MIL/uL — ABNORMAL LOW (ref 4.22–5.81)

## 2010-08-27 LAB — BASIC METABOLIC PANEL
BUN: 31 mg/dL — ABNORMAL HIGH (ref 6–23)
CO2: 27 mEq/L (ref 19–32)
Calcium: 9.8 mg/dL (ref 8.4–10.5)
Glucose, Bld: 131 mg/dL — ABNORMAL HIGH (ref 70–99)
Potassium: 3.8 mEq/L (ref 3.5–5.1)
Sodium: 137 mEq/L (ref 135–145)
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
## 2010-09-01 NOTE — Discharge Summary (Signed)
[name], [name] NO.:  [id number]  MEDICAL RECORD NO.:  [id number]  LOCATION:  [id number]                         FACILITY:  [facility]  PHYSICIAN:  Alphia Moh, MD   DATE OF BIRTH:  12-21-1945  DATE OF ADMISSION:  08/25/2010 DATE OF DISCHARGE:  08/27/2010                              DISCHARGE SUMMARY   DISCHARGE DIAGNOSES: 1. Acute-on-chronic congestive heart failure exacerbation. 2. Cervical myalgia. 3. Coronary artery disease, status post coronary artery bypass     grafting. 4. Insulin-dependent diabetes mellitus. 5. Hypertension. 6. Dyslipidemia. 7. Chronic renal insufficiency. 8. Anemia of chronic disease. 9. History of cocaine use. 10.Ischemic cardiomyopathy. 11.Osteoarthritis. 12.Chronic back pain.  DISCHARGE MEDICATIONS: 1. Simvastatin 10 mg tab p.o. once daily. 2. Plavix 75 mg p.o. once in the morning. 3. Furosemide 20 mg tab p.o. 2 pills once daily. 4. Carvedilol 6.25 mg p.o. twice daily. 5. Nitroglycerin 0.4 mg tab SL p.r.n. q.5 minutes x3 for chest pain. 6. Hydralazine 25 mg p.o. b.i.d. 7. Isosorbide dinitrate 20 mg p.o. b.i.d. 8. Trazodone 150 mg p.o. at bedtime. 9. Oxycodone 10 mg IR tab p.o. p.r.n. q.12 h. for pain. 10.Vicodin 5/500 p.o. q.6 h. p.r.n. for pain for 5 days 20 tabs.  DISPOSITION AND FOLLOWUP:  The patient has an appointment at [facility] on September 05, 2010, at 9:15 a.m. with Dr. Newt Lukes.  At that time, the patient will need followup status of neck pain as well as medication compliance and improvement of shortness of breath.  The patient may also benefit from social work consult for cocaine abuse cessation counseling. We also recommend checking the patient's lipid panel to establish baseline and monitor the patient's response to statin treatment.  PROCEDURES PERFORMED:  Chest x-ray, 2-view performed on August 25, 2010. Impression:  Mild central vascular congestion and a few right lower lobe curly B-lines which  may indicate early interstitial edema less likely scarring.  CONSULTATIONS:  None.  HISTORY OF PRESENT ILLNESS:  [name] is a 65 year old African American male with past medical history of CHF, CAD with CABG, hypertension, diabetes mellitus type 2, chronic renal insufficiency and cocaine abuse who presented on August 25, 2010, to the ED with shortness of breath and cough.  The cough had started one week prior to admission, it was nonproductive and worsened throughout the week and they finally developed shortness of breath on the day of admission.  He has had two- pillow orthopnea for months and has had CHF exacerbations in the past, but he indicated that he was medication compliant.  His last 2-D echo was in August 2011 which showed an EF of 35% to 40% and stage 3 diastolic dysfunction.  He had an NM-SPECT on September 2011, which showed previously inferior wall infarct with hypokinesia along the inferior wall and EF of 34%.  On the day of admission, he also complained of pain in his neck that localized to the right side.  The patient was described as constant and achy, that is improves somewhat. In addition, he had pain in his left arm localized to his wrist and forearm, which had been present for years and had been diagnosed with osteoarthritis in the past.  He denied any recent cold or flu like symptoms, weight loss or weight gain, chest pain, headache, dizziness, wheezing, changes in his bowel movements, blood in his stool, disorientation, or perioral edema.  Of note, he did report smoking cocaine on August 21, 2010, Monday prior to the admission.  PHYSICAL EXAMINATION:  VITAL SIGNS:  Temperature 97.5, blood pressure 160/79, heart rate 71, respiratory rate 22, and O2 sats 99% on room air. GENERAL:  Sitting in bed, eating lunch, conversing following commands. HEAD:  Normocephalic and atraumatic. EYES:  Vision grossly intact.  Pupils equal, round and reactive to light.  No  injection or anicteric. MOUTH:  Moist mucous membranes. NECK:  Supple.  Limited range of motion secondary to pain with no thyromegaly or JVD. LUNGS:  Normal respiratory effort.  No accessory muscle use.  Normal breath sounds.  No crackles and no wheezes. HEART:  Normal rate.  Regular rhythm.  No murmur.  No gallop and no rub. ABDOMEN:  Soft and nondistended with no guarding. MSK:  No joint swelling.  No joint warmth and no redness over joints. Pulses are 2+.  Pedal pulses bilaterally. EXTREMITIES:  No cyanosis, clubbing, or edema. NEUROLOGIC:  Alert and oriented x3.  Cranial nerve II through XII grossly intact with no facial asymmetry. SKIN:  Turgor normal and no rashes.  He has scattered 1-2 cm healing lesions on anterior lower extremities bilaterally. PSYCH:  Oriented x3.  Memory grossly intact.  Normally interactive with good eye contacts.  No anxious appearing and not depressed appearing.  HOSPITAL COURSE: 1. Acute on chronic diastolic CHF.  Pro-BNP on admission was 1299.     Pro-BNP on discharge was 505.8, which is possibly his new     baseline.  Chest x-ray on day of admission was consistent with     acute CHF exacerbation although mild. The trigger for exacerbation     included medication noncompliance versus recent cocaine use.     Infection was in differential; however, the patient remained     afebrile with no increase in WBC.  He was admitted to telemetry and     cardiac enzymes were followed, which were negative x3.  He was     placed on strict I's and O's. His weight was monitored and he did     not have significant weight gain or weight loss, but did not have     significant BMs during this hospital stay.  He was initially placed      on Lasix 40 mg IV b.i.d., which was decreased on day 2 to Lasix 20 mg     IV as  the patient showed no sign of fluid overload with no     crackles in his lungs and no pedal or trace edema.  He remained     on hydralazine and isosorbide  home doses throughout this stay.  He was     also offered albuterol inhaler for shortness of breath but he did not     need it.  He was offered morphine 1-2 mg q.4 h. for anxiety but did not     needed it. He was not started on ACE inhibitor per his Cardiologist      recommendation on August 2011but was adivsed to discontinue it in September 2011     due to his renal insufficiency. Per his Cardiologist he is to remain off     ACE inhibitors. 2. Neck pain/cervical myalgia.  The patient presented with neck pain  described as a constant ache which limited his neck range of motion,     specifically when trying to move his head from side-to-side. The patient     was initially offered morphine IV for pain, but had no pain relief. On day      of discharge, he was offered Flexeril with moderate pain relief. He was      discharged home with no pain medications, but was encouraged to continue      taking his home oxycodone to improve the pain and also to follow up with his     PCP if the pain increases in intensity.  He reported that     the pain had improved upon discharge.  3.  CAD, status post CABG. The patient was continued on coated aspirin and     Plavix with instructions to continue taking them at home.  3. Insulin-dependent diabetes mellitus.  Hemoglobin  A1c was 7.2,      which is close to goal for him. He was advised to continue his home Lantus,     but to follow up as outpatient for monitoring, insulin adjustments as needed,     and diet control. 4. Hypertension.  His blood pressure was elevated upon admission.     He was restarted on home hydralazine and isosorbide. Blood pressure      was well controlled during this hospital stay.  He also received      Lasix IV but was discharged hom with no prescription for Lasix.  5. Dyslipidemia.  He was continued on home Zocor.  His fasting lipid     panel was not checked during this hospital stay.  He was given recommendation     to follow up  with his PCP and have his lipid panel checked as an     outpatient setting. 6. Chronic renal insufficiency with his baseline creatinine is 1.99,     initially when he started Lasix treatment, his creatinine went up     to 2.2; however, on the day of discharge, it decrease to 2.1 as     expected.  He was monitored throughout the stay and his creatinine     seem to have returned to baseline. 7. Chronic anemia.  The patient's hemoglobin stayed close to his baseline of     13 during this hospital admission and he remained asymptomatic. He was      stable at discharge with a hemoglobin of 12.2. 8. History of cocaine abuse.  His admission UDS was positive for     cocaine and THC.  He was not seen by social work during this stay. He was     advised to discuss this issue with his PCP his desire for social work      help with substance abuse cessation counseling.  DISCHARGE VITALS:  Temperature 97.5, pulse 95, respirations 18 per minute, systolic blood pressure [id number], diastolic blood pressure is 69, and O2 saturations 98% on room air.  DISCHARGE LABS:  CBC, WBC 3.3, hemoglobin 12.2, hematocrit 35.8, and platelets 167.  BMET, sodium 137, potassium 3.8, chloride 102, bicarb 27, BUN 31, creatinine 2.1, glucose 131, and potassium 9.8.  Pro-BNP 505.8.  Of note, magnesium was checked and it was 2.4.  Hemoglobin  A1c 7.2 and TSH 0.977.    ______________________________ Donnelly Angelica   ______________________________ Alphia Moh, MD    SK/MEDQ  D:  08/28/2010  T:  08/29/2010  Job:  [id number]  Electronically Signed by Thelma Comp  GARG  on 09/01/2010 12:17:16 PM Electronically Signed by Alphia Moh MD on 09/01/2010 02:18:20 PM

## 2010-09-05 ENCOUNTER — Ambulatory Visit: Payer: Medicare Other | Admitting: Internal Medicine

## 2010-11-20 ENCOUNTER — Inpatient Hospital Stay (INDEPENDENT_AMBULATORY_CARE_PROVIDER_SITE_OTHER)
Admission: RE | Admit: 2010-11-20 | Discharge: 2010-11-20 | Disposition: A | Discharge status: Home / Self Care | Payer: Medicare Other | Source: Ambulatory Visit | Attending: Emergency Medicine | Admitting: Emergency Medicine

## 2010-11-20 DIAGNOSIS — R05 Cough: Secondary | ICD-10-CM

## 2010-11-20 DIAGNOSIS — R059 Cough, unspecified: Secondary | ICD-10-CM

## 2010-11-29 ENCOUNTER — Emergency Department (HOSPITAL_COMMUNITY): Payer: Medicare Other

## 2010-11-29 ENCOUNTER — Emergency Department (HOSPITAL_COMMUNITY)
Admission: EM | Admit: 2010-11-29 | Discharge: 2010-11-29 | Disposition: A | Discharge status: Home / Self Care | Payer: Medicare Other | Attending: Emergency Medicine | Admitting: Emergency Medicine

## 2010-11-29 DIAGNOSIS — E119 Type 2 diabetes mellitus without complications: Secondary | ICD-10-CM | POA: Insufficient documentation

## 2010-11-29 DIAGNOSIS — Z79899 Other long term (current) drug therapy: Secondary | ICD-10-CM | POA: Insufficient documentation

## 2010-11-29 DIAGNOSIS — J069 Acute upper respiratory infection, unspecified: Secondary | ICD-10-CM | POA: Insufficient documentation

## 2010-11-29 DIAGNOSIS — I509 Heart failure, unspecified: Secondary | ICD-10-CM | POA: Insufficient documentation

## 2010-11-29 DIAGNOSIS — E785 Hyperlipidemia, unspecified: Secondary | ICD-10-CM | POA: Insufficient documentation

## 2010-11-29 DIAGNOSIS — Z87891 Personal history of nicotine dependence: Secondary | ICD-10-CM | POA: Insufficient documentation

## 2010-11-29 DIAGNOSIS — I2510 Atherosclerotic heart disease of native coronary artery without angina pectoris: Secondary | ICD-10-CM | POA: Insufficient documentation

## 2010-11-29 DIAGNOSIS — I10 Essential (primary) hypertension: Secondary | ICD-10-CM | POA: Insufficient documentation

## 2010-11-29 DIAGNOSIS — F1911 Other psychoactive substance abuse, in remission: Secondary | ICD-10-CM | POA: Insufficient documentation

## 2010-11-29 LAB — GLUCOSE, CAPILLARY: Glucose-Capillary: 133 mg/dL — ABNORMAL HIGH (ref 70–99)

## 2011-04-12 ENCOUNTER — Other Ambulatory Visit: Payer: Self-pay | Admitting: Family Medicine

## 2011-04-12 DIAGNOSIS — I10 Essential (primary) hypertension: Secondary | ICD-10-CM | POA: Diagnosis not present

## 2011-04-17 ENCOUNTER — Other Ambulatory Visit: Payer: Self-pay | Admitting: Family Medicine

## 2011-04-24 ENCOUNTER — Emergency Department (HOSPITAL_COMMUNITY)
Admission: EM | Admit: 2011-04-24 | Discharge: 2011-04-24 | Disposition: A | Discharge status: Home / Self Care | Payer: No Typology Code available for payment source | Attending: Emergency Medicine | Admitting: Emergency Medicine

## 2011-04-24 ENCOUNTER — Emergency Department (HOSPITAL_COMMUNITY): Payer: No Typology Code available for payment source

## 2011-04-24 ENCOUNTER — Encounter (HOSPITAL_COMMUNITY): Payer: Self-pay | Admitting: *Deleted

## 2011-04-24 DIAGNOSIS — Y92410 Unspecified street and highway as the place of occurrence of the external cause: Secondary | ICD-10-CM | POA: Insufficient documentation

## 2011-04-24 DIAGNOSIS — Z951 Presence of aortocoronary bypass graft: Secondary | ICD-10-CM | POA: Diagnosis not present

## 2011-04-24 DIAGNOSIS — S335XXA Sprain of ligaments of lumbar spine, initial encounter: Secondary | ICD-10-CM | POA: Insufficient documentation

## 2011-04-24 DIAGNOSIS — T148XXA Other injury of unspecified body region, initial encounter: Secondary | ICD-10-CM | POA: Diagnosis not present

## 2011-04-24 DIAGNOSIS — I509 Heart failure, unspecified: Secondary | ICD-10-CM | POA: Insufficient documentation

## 2011-04-24 DIAGNOSIS — M549 Dorsalgia, unspecified: Secondary | ICD-10-CM | POA: Diagnosis not present

## 2011-04-24 DIAGNOSIS — S39012A Strain of muscle, fascia and tendon of lower back, initial encounter: Secondary | ICD-10-CM

## 2011-04-24 DIAGNOSIS — M545 Low back pain: Secondary | ICD-10-CM | POA: Diagnosis not present

## 2011-04-24 DIAGNOSIS — I10 Essential (primary) hypertension: Secondary | ICD-10-CM | POA: Insufficient documentation

## 2011-04-24 DIAGNOSIS — E119 Type 2 diabetes mellitus without complications: Secondary | ICD-10-CM | POA: Diagnosis not present

## 2011-04-24 DIAGNOSIS — R52 Pain, unspecified: Secondary | ICD-10-CM | POA: Diagnosis not present

## 2011-04-24 HISTORY — DX: Presence of aortocoronary bypass graft: Z95.1

## 2011-04-24 HISTORY — DX: Essential (primary) hypertension: I10

## 2011-04-24 MED ORDER — HYDROMORPHONE HCL PF 1 MG/ML IJ SOLN
1.0000 mg | Freq: Once | INTRAMUSCULAR | Status: AC
  Administered 2011-04-24: 1 mg via INTRAMUSCULAR
  Filled 2011-04-24: qty 1

## 2011-04-24 MED ORDER — HYDROCODONE-ACETAMINOPHEN 5-325 MG PO TABS: 1.0000 | ORAL_TABLET | Freq: Four times a day (QID) | ORAL | Status: AC | PRN
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
## 2011-06-19 DIAGNOSIS — M545 Low back pain, unspecified: Secondary | ICD-10-CM | POA: Diagnosis not present

## 2011-07-18 ENCOUNTER — Emergency Department (INDEPENDENT_AMBULATORY_CARE_PROVIDER_SITE_OTHER)
Admission: EM | Admit: 2011-07-18 | Discharge: 2011-07-18 | Disposition: A | Discharge status: Home / Self Care | Payer: Medicare Other | Source: Home / Self Care | Attending: Emergency Medicine | Admitting: Emergency Medicine

## 2011-07-18 ENCOUNTER — Encounter (HOSPITAL_COMMUNITY): Payer: Self-pay

## 2011-07-18 DIAGNOSIS — J069 Acute upper respiratory infection, unspecified: Secondary | ICD-10-CM

## 2011-07-18 MED ORDER — AMOXICILLIN 500 MG PO CAPS: 1000.0000 mg | ORAL_CAPSULE | Freq: Three times a day (TID) | ORAL | Status: DC

## 2011-07-18 MED ORDER — ALBUTEROL SULFATE HFA 108 (90 BASE) MCG/ACT IN AERS: 1.0000 | INHALATION_SPRAY | Freq: Four times a day (QID) | RESPIRATORY_TRACT | Status: DC | PRN

## 2011-07-18 MED ORDER — BENZONATATE 200 MG PO CAPS: 200.0000 mg | ORAL_CAPSULE | Freq: Three times a day (TID) | ORAL | Status: AC | PRN

## 2011-07-18 NOTE — ED Provider Notes (Signed)
Chief Complaint  Patient presents with  . Exposure to STD    History of Present Illness:   The patient is a 66 year old male who has had a two-day history of cough productive of yellow sputum, wheezing, felt feverish, had nasal congestion and clear rhinorrhea, and a sore throat. He denies any chest pain. He's had no headache, sinus pressure, earache, abdominal pain, nausea, or vomiting. He is concerned about STDs. He states he had oral sex with the woman about 2 days before the symptoms came on. He denies any genital symptoms or urethral discharge. He requests a throat culture for gonorrhea.  Review of Systems:  Other than noted above, the patient denies any of the following symptoms. Systemic:  No fever, chills, sweats, fatigue, myalgias, headache, or anorexia. Eye:  No redness, pain or drainage. ENT:  No earache, ear congestion, nasal congestion, sneezing, rhinorrhea, sinus pressure, sinus pain, post nasal drip, or sore throat. Lungs:  No cough, sputum production, wheezing, shortness of breath, or chest pain. GI:  No abdominal pain, nausea, vomiting, or diarrhea. Skin:  No rash or itching.  [place]:  Past medical history, family history, social history, meds, and allergies were reviewed.  Physical Exam:   Vital signs:  BP 123/52  Pulse 70  Temp(Src) 98 F (36.7 C) (Oral)  Resp 16  SpO2 100% General:  Alert, in no distress. Eye:  No conjunctival injection or drainage. Lids were normal. ENT:  TMs and canals were normal, without erythema or inflammation.  Nasal mucosa was clear and uncongested, without drainage.  Mucous membranes were moist.  Pharynx was clear, without exudate or drainage.  There were no oral ulcerations or lesions. Neck:  Supple, no adenopathy, tenderness or mass. Lungs:  No respiratory distress.  Lungs were clear to auscultation, without wheezes, rales or rhonchi.  Breath sounds were clear and equal bilaterally. Lungs were resonant to percussion.  No egophony. Heart:   Regular rhythm, without gallops, murmers or rubs. Skin:  Clear, warm, and dry, without rash or lesions.  Other Labs Obtained at Urgent Care Center:  A throat culture for gonorrhea was obtained.  Results are pending at this time and we will call about any positive results.  Assessment:  The encounter diagnosis was Viral upper respiratory infection.  Plan:   1.  The following meds were prescribed:   New Prescriptions   ALBUTEROL (PROVENTIL HFA;VENTOLIN HFA) 108 (90 BASE) MCG/ACT INHALER    Inhale 1-2 puffs into the lungs every 6 (six) hours as needed for wheezing.   AMOXICILLIN (AMOXIL) 500 MG CAPSULE    Take 2 capsules (1,000 mg total) by mouth 3 (three) times daily.   BENZONATATE (TESSALON) 200 MG CAPSULE    Take 1 capsule (200 mg total) by mouth 3 (three) times daily as needed for cough.   2.  The patient was instructed in symptomatic care and handouts were given. 3.  The patient was told to return if becoming worse in any way, if no better in 3 or 4 days, and given some red flag symptoms that would indicate earlier return.  The patient was told not to get the prescription for antibiotic filled unless his  respiratory symptoms had persisted for more than 7 to 10 days.     Harden Mo, MD 07/18/11 [phone]

## 2011-07-18 NOTE — ED Notes (Signed)
States he has been exposed to STD via oral sex; syx for 2 days

## 2011-07-18 NOTE — Discharge Instructions (Signed)

## 2011-07-26 ENCOUNTER — Encounter (HOSPITAL_COMMUNITY): Payer: Self-pay | Admitting: Emergency Medicine

## 2011-07-26 ENCOUNTER — Emergency Department (INDEPENDENT_AMBULATORY_CARE_PROVIDER_SITE_OTHER): Payer: Medicare Other

## 2011-07-26 ENCOUNTER — Emergency Department (INDEPENDENT_AMBULATORY_CARE_PROVIDER_SITE_OTHER)
Admission: EM | Admit: 2011-07-26 | Discharge: 2011-07-26 | Disposition: A | Discharge status: Home / Self Care | Payer: Medicare Other | Source: Home / Self Care | Attending: Emergency Medicine | Admitting: Emergency Medicine

## 2011-07-26 DIAGNOSIS — R0602 Shortness of breath: Secondary | ICD-10-CM | POA: Diagnosis not present

## 2011-07-26 DIAGNOSIS — R062 Wheezing: Secondary | ICD-10-CM | POA: Diagnosis not present

## 2011-07-26 DIAGNOSIS — J209 Acute bronchitis, unspecified: Secondary | ICD-10-CM | POA: Diagnosis not present

## 2011-07-26 DIAGNOSIS — J40 Bronchitis, not specified as acute or chronic: Secondary | ICD-10-CM | POA: Diagnosis not present

## 2011-07-26 DIAGNOSIS — J449 Chronic obstructive pulmonary disease, unspecified: Secondary | ICD-10-CM | POA: Diagnosis not present

## 2011-07-26 MED ORDER — METHYLPREDNISOLONE ACETATE 80 MG/ML IJ SUSP
INTRAMUSCULAR | Status: AC
  Filled 2011-07-26: qty 1

## 2011-07-26 MED ORDER — HYDROCOD POLST-CHLORPHEN POLST 10-8 MG/5ML PO LQCR: 5.0000 mL | Freq: Two times a day (BID) | ORAL | Status: DC | PRN

## 2011-07-26 MED ORDER — METHYLPREDNISOLONE ACETATE 80 MG/ML IJ SUSP
80.0000 mg | Freq: Once | INTRAMUSCULAR | Status: AC
  Administered 2011-07-26: 80 mg via INTRAMUSCULAR

## 2011-07-26 MED ORDER — PREDNISONE 10 MG PO TABS: ORAL_TABLET | ORAL | Status: DC

## 2011-07-26 NOTE — ED Provider Notes (Signed)
Chief Complaint  Patient presents with  . Cough  . Diarrhea    History of Present Illness:   [name] is a 66 year old male who returns with symptoms of cough. He was seen here about one week ago with similar symptoms. They have not been going on for a total of 3 weeks. He describes cough productive of white sputum, wheezing, rhinorrhea, and clear nasal drainage. He denies fever, chills, sweats, chest pain, shortness of breath, nausea, or vomiting. Since she's been on the antibiotics he's had diarrhea with 3 or 4 loose stools per day without blood or mucus. He denies any abdominal pain. He has no history of asthma or COPD, but he does smoke marijuana daily.  Review of Systems:  Other than noted above, the patient denies any of the following symptoms. Systemic:  No fever, chills, sweats, fatigue, myalgias, headache, or anorexia. Eye:  No redness, pain or drainage. ENT:  No earache, ear congestion, nasal congestion, sneezing, rhinorrhea, sinus pressure, sinus pain, post nasal drip, or sore throat. Lungs:  No cough, sputum production, wheezing, shortness of breath, or chest pain. GI:  No abdominal pain, nausea, vomiting, or diarrhea. Skin:  No rash or itching.  [place]:  Past medical history, family history, social history, meds, and allergies were reviewed.  Physical Exam:   Vital signs:  BP 121/55  Pulse 64  Temp(Src) 97.8 F (36.6 C) (Oral)  Resp 20  SpO2 97% General:  Alert, in no distress. Eye:  No conjunctival injection or drainage. Lids were normal. ENT:  TMs and canals were normal, without erythema or inflammation.  Nasal mucosa was clear and uncongested, without drainage.  Mucous membranes were moist.  Pharynx was clear, without exudate or drainage.  There were no oral ulcerations or lesions. Neck:  Supple, no adenopathy, tenderness or mass. Lungs:  No respiratory distress.  He has bilateral expiratory wheezes, good air movement, in no respiratory distress including no use of  accessory muscles or retractions. He has no rales or rhonchi. Lungs were resonant to percussion.  No egophony. Heart:  Regular rhythm, without gallops, murmers or rubs. Skin:  Clear, warm, and dry, without rash or lesions.  Radiology:  Dg Chest 2 View  07/26/2011  *RADIOLOGY REPORT*  Clinical Data: Shortness of breath for 2 weeks, productive cough and wheezing for 3 weeks, former smoker  CHEST - 2 VIEW  Comparison: 11/29/2010  Findings: Normal heart size post CABG. Atherosclerotic calcification aorta. Pulmonary vascularity normal. Bronchitic and question minimal emphysematous changes. No acute infiltrate, pleural effusion or pneumothorax. End plate spur formation thoracolumbar spine with question diffuse idiopathic skeletal hyperostosis.  IMPRESSION: Post CABG. Bronchitic and question minimal emphysematous changes. No acute abnormalities.  Original Report Authenticated By: Burnetta Sabin, M.D.   Course in Urgent Care Center:   He was given Depo-Medrol 80 mg IM and tolerated this well without any immediate side effects.  Assessment:  The primary encounter diagnosis was Acute bronchitis. A diagnosis of COPD (chronic obstructive pulmonary disease) was also pertinent to this visit.  Plan:   1.  The following meds were prescribed:   New Prescriptions   CHLORPHENIRAMINE-HYDROCODONE (TUSSIONEX) 10-8 MG/5ML LQCR    Take 5 mLs by mouth every 12 (twelve) hours as needed.   PREDNISONE (DELTASONE) 10 MG TABLET    Take 4 tabs daily for 4 days, 3 tabs daily for 4 days, 2 tabs daily for 4 days, then 1 tab daily for 4 days.   2.  The patient was instructed in symptomatic  care and handouts were given. 3.  The patient was told to return if becoming worse in any way, if no better in 3 or 4 days, and given some red flag symptoms that would indicate earlier return. He was told to stop the amoxicillin and return if no better in one week.  Harden Mo, MD 07/26/11 [phone]

## 2011-07-26 NOTE — Discharge Instructions (Signed)
How to Quit Smoking  According to the U.S. Surgeon General, about 440,000 people in the [place] alone die from complications related to tobacco use.  More deaths occur due to cigarette smoking than illegal drug use, AIDS, car accidents, alcohol-related deaths, suicide and homicide combined.  Smoking accounts for about 30% of all cancer related deaths, including more than 80% of lung cancer deaths. Smoking has also been linked as the cause of many other diseases like heart disease, bronchitis, emphysema, stroke, and complications of pneumonia as well as causing an increased risk of miscarriage, premature births, stillbirth, infant death, and low birth weight in infants.  For this reason, the U.S. Surgeon General recommends:  "Smoking cessation (stopping smoking) represents the single most important step that smokers can take to enhance the length and quality of their lives."  Why is it so hard to Quit?  Tobacco products contain Nicotine which is highly addictive - probably as addictive as heroin or cocaine.  Over time, your body becomes both physically and psychologically dependent on it. Finally, attempts to quit smoking are complicated by withdrawal reactions like depression, irritability, trouble sleeping, trouble concentrating, restlessness, headaches, weight gain, and excessive fatigue as well as a lack of support.  These symptoms can last from a few days to several weeks.  Why should you Quit?  Live longer and healthier  Can improves the health of your housemates (children, spouse)  Increases your energy and breathing ability  Lowers risk of heart attack, stroke and cancer  Saves money - For example, if you smoke a pack of cigarettes a day and each pack costs about $3.00, then you will save about $1,100.00 per year, about $5,500. in 5 years and $11,000.00 in 10 years.  What you can do: 1. Talk to your health care provider - There are many smoking cessations aids available, both  prescription or over-the-counter.  Check with your doctor and pharmacist before taking any of these products to see which one is best for you.  Develop a plan with your healthcare provider which may include nicotine replacement, prescription medication and/or counseling.  2. Get Started - Elta [place] a start date on your calendar.  Remove cigarettes and ashtrays from your home, car, and office.  Don't be around other smokers.  Stop smoking.not even a puff!  3. Support - Talk to family, friends, and co-workers about your plan to stop smoking.  Ask them not to smoke around you.  4. Coping Strategies  The four "A"s to help during tough times:  a. Avoid - Avoid other smokers or places where smoking is commonplace.  Avoid alcoholic beverages as these may increase your desire to smoke b. Alter - Change your routine.  Drink water/juices instead of alcohol or coffee.  Take a walk or visit with someone during your coffee break.  Change your route to work. c. Alternatives - Substitute raw vegetables like carrot sticks or celery, sugarless candy or gum for the habit of having a cigarette. d. Activities - Start an exercise program (talk to your doctor prior to beginning any exercise program). Try out a new "hands on" hobby to distract you from smoking and to keep your hands busy like woodworking or needlepoint.   Additional tips for specific withdrawal symptoms:   Cravings for tobacco:  - Distract yourself       - Deep-breathing exercises       - Remember that cravings are brief    Irritability:   - Take a few slow, deep  breaths      - Soak in a hot bath    Insomnia:   - Take a walk several hours before bedtime      - Avoid caffeinated beverages after noon      - Read a book      - Take a warm bath      - Banana or warm milk    Increased appetite:  - Drink water or low-calorie drinks      - Make a survival Kit: Include straws, cinnamon        sticks,       - coffee stirrers, licorice, toothpicks, gum,  or fresh         vegetables    Inability to concentrate: - Take a brisk walk      - Lighten your schedule for a couple of days      - Take more breaks   Fatigue:   - Get a good night's sleep      - Take a nap      - Don't overdo it for 2-4 weeks    Constipation, gas,  stomach pain:   - Drink plenty of fluids      - Increase fiber: fruit, raw vegetables, whole grain        cereals      - Talk to your doctor about diet changes    5. Dealing with Relapses - Most relapses occur within the first 2-3 months.  This is common so don't be discouraged.  Some people may take several attempts before they can quit smoking completely.  6. Reward yourself - Set-up a rewards program for every milestone, like 1st month after quitting, 3rd month after quitting, and 6th month after quitting to keep you motivated.  What your doctor can do: Perform a physical exam and order diagnostic tests like laboratory blood work and a chest X-ray.  This will help to identify health related conditions that might benefit from smoking cessation. Review your health history to make sure there are no contraindications with specific smoking cessation aids like allergies to medications or ingredients in these medications or conflicts with your current medications. Prescribe smoking cessation aids such as: Over-the-counter aid:  Nicotine gum, Nicotine patch Prescription aids:  Nicotine spray, Nicotine inhaler, or Bupropion SR (non-nicotine). Offer or recommend individual or group counseling to support you during the initial quitting and maintenance phase of your smoking cessation program. Offer or recommend other alternative treatments like hypnosis or acupuncture.  What you can expect: Benefits from quitting smoking:  Improved Physical Appearance - Minimizes or stops premature wrinkling of skin, bad breath, stained teeth, gum disease, clothes/hair "smoke" odors, and yellow fingernails  Improved Daily Activities - Food  tastes better, sense of smell improves, and decreases shortness of breath during ordinary activities like climbing stairs, walking, and performing light housework  Decreased Financial Cost - From both no longer purchasing cigarettes and the health care cost for medical treatment of conditions caused by smoking.  Health of Others - Decreases risk of exposing others to the effects of second hand smoke.  Sets an example for youth. Benefits of Quitting according to research from the U.S. Surgeon General:  20 minutes after:  Blood pressure lowers & Body temperature normalizes  8 hours after: Carbon monoxide levels begins to normalize   24 hours after: Heart attack risk decreases  2 weeks to 3 months after:  Blood flow improves & lung function increases   1 to 9  months after:  Coughing, sinus congestion, fatigue, shortness of breath decrease   1 year after: Risk of developing coronary heart disease is half that of a smoker  5 years after: Risk of a stroke decreases to that of a nonsmoker  10 years after: Risk of death due to lung cancer death is halved.  Risk of oral, throat, esophagus, bladder, kidney, and pancreatic cancer decreases.  15 years after: Risk of developing coronary heart disease is half that of a nonsmoker      References:  Here are references that can provide additional information and support:  [place] (800) ACS-2345       (800) Q2878766 or (800) (681) 725-5956 www.cancer.org       www.amhrt.Pharmacist, community Academy of [employer] Acupuncture (475)635-5567 or 331-010-7836  (800651-698-9948 or 417-423-7683 www.lungusa.org       www.medicalacupuncture.[place]     Office on [place] for Disease Control and Prevention (800) 4-CANCER or (800) Y5278638  347-437-9057 www.cancer.gov       VoipPolicy.ch  Nicotine  Anonymous      Smokefree.gov 516 754 4068) TRY-NICA 463-252-3226)   562-112-8269) 44U-QUIT (419)460-0522) www.nicotine-anonymous.org   www.smokefree.gov  Contact your doctor or pharmacist if you have specific questions about starting a smoking cessation program.  Bronchitis Bronchitis is the body's way of reacting to injury and/or infection (inflammation) of the bronchi. Bronchi are the air tubes that extend from the windpipe into the lungs. If the inflammation becomes severe, it may cause shortness of breath. CAUSES  Inflammation may be caused by:  A virus.   Germs (bacteria).   Dust.   Allergens.   Pollutants and many other irritants.  The cells lining the bronchial tree are covered with tiny hairs (cilia). These constantly beat upward, away from the lungs, toward the mouth. This keeps the lungs free of pollutants. When these cells become too irritated and are unable to do their job, mucus begins to develop. This causes the characteristic cough of bronchitis. The cough clears the lungs when the cilia are unable to do their job. Without either of these protective mechanisms, the mucus would settle in the lungs. Then you would develop pneumonia. Smoking is a common cause of bronchitis and can contribute to pneumonia. Stopping this habit is the single most important thing you can do to help yourself. TREATMENT   Your caregiver may prescribe an antibiotic if the cough is caused by bacteria. Also, medicines that open up your airways make it easier to breathe. Your caregiver may also recommend or prescribe an expectorant. It will loosen the mucus to be coughed up. Only take over-the-counter or prescription medicines for pain, discomfort, or fever as directed by your caregiver.   Removing whatever causes the problem (smoking, for example) is critical to preventing the problem from getting worse.   Cough suppressants may be prescribed for relief of cough symptoms.   Inhaled medicines may be prescribed to  help with symptoms now and to help prevent problems from returning.   For those with recurrent (chronic) bronchitis, there may be a need for steroid medicines.  SEEK IMMEDIATE MEDICAL CARE IF:   During treatment, you develop more pus-like mucus (purulent sputum).   You have a fever.   Your baby is older than 3 months with a rectal temperature of 102 F (38.9 C) or higher.  Your baby is 92 months old or younger with a rectal temperature of 100.4 F (38 C) or higher.   You become progressively more ill.   You have increased difficulty breathing, wheezing, or shortness of breath.  It is necessary to seek immediate medical care if you are elderly or sick from any other disease. MAKE SURE YOU:   Understand these instructions.   Will watch your condition.   Will get help right away if you are not doing well or get worse.  Document Released: 02/26/2005 Document Revised: 02/15/2011 Document Reviewed: 01/06/2008 [facility] Patient Information 2012 [place].Chronic Obstructive Pulmonary Disease Chronic obstructive pulmonary disease (COPD) is a condition in which airflow from the lungs is restricted. The lungs can never return to normal, but there are measures you can take which will improve them and make you feel better. CAUSES   Smoking.   Exposure to secondhand smoke.   Breathing in irritants (pollution, cigarette smoke, strong smells, aerosol sprays, paint fumes).   History of lung infections.  TREATMENT  Treatment focuses on making you comfortable (supportive care). Your caregiver may prescribe medications (inhaled or pills) to help improve your breathing. HOME CARE INSTRUCTIONS   If you smoke, stop smoking.   Avoid exposure to smoke, chemicals, and fumes that aggravate your breathing.   Take antibiotic medicines as directed by your caregiver.   Avoid medicines that dry up your system and slow down the elimination of secretions (antihistamines and cough syrups). This  decreases respiratory capacity and may lead to infections.   Drink enough water and fluids to keep your urine clear or pale yellow. This loosens secretions.   Use humidifiers at home and at your bedside if they do not make breathing difficult.   Receive all protective vaccines your caregiver suggests, especially pneumococcal and influenza.   Use home oxygen as suggested.   Stay active. Exercise and physical activity will help maintain your ability to do things you want to do.   Eat a healthy diet.  SEEK MEDICAL CARE IF:   You develop pus-like mucus (sputum).   Breathing is more labored or exercise becomes difficult to do.   You are running out of the medicine you take for your breathing.  SEEK IMMEDIATE MEDICAL CARE IF:   You have a rapid heart rate.   You have agitation, confusion, tremors, or are in a stupor (family members may need to observe this).   It becomes difficult to breathe.   You develop chest pain.   You have a fever.  MAKE SURE YOU:   Understand these instructions.   Will watch your condition.   Will get help right away if you are not doing well or get worse.  Document Released: 12/06/2004 Document Revised: 02/15/2011 Document Reviewed: 04/28/2010 [facility] Patient Information 2012 [place].

## 2011-07-26 NOTE — ED Notes (Signed)
PT HERE WITH CONTINUOUS UNRELIEVED BY RX MEDS.PT WAS SEEN HERE BY DR.KELLER FOR URI AND PRESCRIBED ATB'S AND TESSALON CAPSULES,INHALOR BUT STATES NOT WORKING.COUGH HAS MOVED TO CHEST WITH CONGESTION AND DIARRHEA X 1 WEEK.NO FEVERS,CHILLS,N,C,OR PAIN.SATS 97% R/A

## 2011-08-22 DIAGNOSIS — E119 Type 2 diabetes mellitus without complications: Secondary | ICD-10-CM | POA: Diagnosis not present

## 2011-08-22 DIAGNOSIS — I10 Essential (primary) hypertension: Secondary | ICD-10-CM | POA: Diagnosis not present

## 2011-08-22 DIAGNOSIS — N289 Disorder of kidney and ureter, unspecified: Secondary | ICD-10-CM | POA: Diagnosis not present

## 2011-08-22 DIAGNOSIS — I509 Heart failure, unspecified: Secondary | ICD-10-CM | POA: Diagnosis not present

## 2011-08-29 DIAGNOSIS — N289 Disorder of kidney and ureter, unspecified: Secondary | ICD-10-CM | POA: Diagnosis not present

## 2011-08-29 DIAGNOSIS — I10 Essential (primary) hypertension: Secondary | ICD-10-CM | POA: Diagnosis not present

## 2011-08-29 DIAGNOSIS — E119 Type 2 diabetes mellitus without complications: Secondary | ICD-10-CM | POA: Diagnosis not present

## 2011-08-31 ENCOUNTER — Emergency Department (HOSPITAL_COMMUNITY): Payer: Medicare Other

## 2011-08-31 ENCOUNTER — Encounter (HOSPITAL_COMMUNITY): Payer: Self-pay | Admitting: Emergency Medicine

## 2011-08-31 ENCOUNTER — Emergency Department (HOSPITAL_COMMUNITY)
Admission: EM | Admit: 2011-08-31 | Discharge: 2011-08-31 | Disposition: A | Discharge status: Home / Self Care | Payer: Medicare Other | Attending: Emergency Medicine | Admitting: Emergency Medicine

## 2011-08-31 DIAGNOSIS — M25579 Pain in unspecified ankle and joints of unspecified foot: Secondary | ICD-10-CM | POA: Insufficient documentation

## 2011-08-31 DIAGNOSIS — I509 Heart failure, unspecified: Secondary | ICD-10-CM | POA: Diagnosis not present

## 2011-08-31 DIAGNOSIS — Z951 Presence of aortocoronary bypass graft: Secondary | ICD-10-CM | POA: Insufficient documentation

## 2011-08-31 DIAGNOSIS — M19079 Primary osteoarthritis, unspecified ankle and foot: Secondary | ICD-10-CM | POA: Diagnosis not present

## 2011-08-31 DIAGNOSIS — E119 Type 2 diabetes mellitus without complications: Secondary | ICD-10-CM | POA: Insufficient documentation

## 2011-08-31 DIAGNOSIS — I10 Essential (primary) hypertension: Secondary | ICD-10-CM | POA: Insufficient documentation

## 2011-08-31 DIAGNOSIS — M949 Disorder of cartilage, unspecified: Secondary | ICD-10-CM | POA: Diagnosis not present

## 2011-08-31 HISTORY — DX: Bronchitis, not specified as acute or chronic: J40

## 2011-08-31 MED ORDER — OXYCODONE-ACETAMINOPHEN 5-325 MG PO TABS: 1.0000 | ORAL_TABLET | ORAL | Status: AC | PRN

## 2011-08-31 MED ORDER — OXYCODONE-ACETAMINOPHEN 5-325 MG PO TABS
1.0000 | ORAL_TABLET | Freq: Once | ORAL | Status: AC
  Administered 2011-08-31: 1 via ORAL
  Filled 2011-08-31: qty 1

## 2011-08-31 MED ORDER — PREDNISONE 20 MG PO TABS
60.0000 mg | ORAL_TABLET | Freq: Once | ORAL | Status: AC
  Administered 2011-08-31: 60 mg via ORAL
  Filled 2011-08-31: qty 3

## 2011-08-31 MED ORDER — PREDNISONE 20 MG PO TABS: 40.0000 mg | ORAL_TABLET | Freq: Every day | ORAL | Status: DC

## 2011-08-31 NOTE — ED Notes (Signed)
Per EMS- pt c/o foot pain started last night, denies injury and fall. Pt reports the pain has increased since last night and cannot walk on his foot now. Good PMS.

## 2011-08-31 NOTE — Discharge Instructions (Signed)
I believe your pain in your ankle is either due to gout or arthritis. Keep your foot elevated at home. Use crutches as needed. Take prednisone as prescribed until all gone for your foot inflammation. Percocet as prescribed as needed for severe pain. Follow up with your doctor in 2-3 days for recheck. Return to ED if fever, increased swelling in the leg, pain in your calf, shortness of breath, or any new other cornering symptom.   Gout Gout is an inflammatory condition (arthritis) caused by a buildup of uric acid crystals in the joints. Uric acid is a chemical that is normally present in the blood. Under some circumstances, uric acid can form into crystals in your joints. This causes joint redness, soreness, and swelling (inflammation). Repeat attacks are common. Over time, uric acid crystals can form into masses (tophi) near a joint, causing disfigurement. Gout is treatable and often preventable. CAUSES  The disease begins with elevated levels of uric acid in the blood. Uric acid is produced by your body when it breaks down a naturally found substance called purines. This also happens when you eat certain foods such as meats and fish. Causes of an elevated uric acid level include:  Being passed down from parent to child (heredity).   Diseases that cause increased uric acid production (obesity, psoriasis, some cancers).   Excessive alcohol use.   Diet, especially diets rich in meat and seafood.   Medicines, including certain cancer-fighting drugs (chemotherapy), diuretics, and aspirin.   Chronic kidney disease. The kidneys are no longer able to remove uric acid well.   Problems with metabolism.  Conditions strongly associated with gout include:  Obesity.   High blood pressure.   High cholesterol.   Diabetes.  Not everyone with elevated uric acid levels gets gout. It is not understood why some people get gout and others do not. Surgery, joint injury, and eating too much of certain foods  are some of the factors that can lead to gout. SYMPTOMS   An attack of gout comes on quickly. It causes intense pain with redness, swelling, and warmth in a joint.   Fever can occur.   Often, only one joint is involved. Certain joints are more commonly involved:   Base of the big toe.   Knee.   Ankle.   Wrist.   Finger.  Without treatment, an attack usually goes away in a few days to weeks. Between attacks, you usually will not have symptoms, which is different from many other forms of arthritis. DIAGNOSIS  Your caregiver will suspect gout based on your symptoms and exam. Removal of fluid from the joint (arthrocentesis) is done to check for uric acid crystals. Your caregiver will give you a medicine that numbs the area (local anesthetic) and use a needle to remove joint fluid for exam. Gout is confirmed when uric acid crystals are seen in joint fluid, using a special microscope. Sometimes, blood, urine, and X-ray tests are also used. TREATMENT  There are 2 phases to gout treatment: treating the sudden onset (acute) attack and preventing attacks (prophylaxis). Treatment of an Acute Attack  Medicines are used. These include anti-inflammatory medicines or steroid medicines.   An injection of steroid medicine into the affected joint is sometimes necessary.   The painful joint is rested. Movement can worsen the arthritis.   You may use warm or cold treatments on painful joints, depending which works best for you.   Discuss the use of coffee, vitamin C, or cherries with your caregiver. These  may be helpful treatment options.  Treatment to Prevent Attacks After the acute attack subsides, your caregiver may advise prophylactic medicine. These medicines either help your kidneys eliminate uric acid from your body or decrease your uric acid production. You may need to stay on these medicines for a very long time. The early phase of treatment with prophylactic medicine can be associated with  an increase in acute gout attacks. For this reason, during the first few months of treatment, your caregiver may also advise you to take medicines usually used for acute gout treatment. Be sure you understand your caregiver's directions. You should also discuss dietary treatment with your caregiver. Certain foods such as meats and fish can increase uric acid levels. Other foods such as dairy can decrease levels. Your caregiver can give you a list of foods to avoid. HOME CARE INSTRUCTIONS   Do not take aspirin to relieve pain. This raises uric acid levels.   Only take over-the-counter or prescription medicines for pain, discomfort, or fever as directed by your caregiver.   Rest the joint as much as possible. When in bed, keep sheets and blankets off painful areas.   Keep the affected joint raised (elevated).   Use crutches if the painful joint is in your leg.   Drink enough water and fluids to keep your urine clear or pale yellow. This helps your body get rid of uric acid. Do not drink alcoholic beverages. They slow the passage of uric acid.   Follow your caregiver's dietary instructions. Pay careful attention to the amount of protein you eat. Your daily diet should emphasize fruits, vegetables, whole grains, and fat-free or low-fat milk products.   Maintain a healthy body weight.  SEEK MEDICAL CARE IF:   You have an oral temperature above 102 F (38.9 C).   You develop diarrhea, vomiting, or any side effects from medicines.   You do not feel better in 24 hours, or you are getting worse.  SEEK IMMEDIATE MEDICAL CARE IF:   Your joint becomes suddenly more tender and you have:   Chills.   An oral temperature above 102 F (38.9 C), not controlled by medicine.  MAKE SURE YOU:   Understand these instructions.   Will watch your condition.   Will get help right away if you are not doing well or get worse.  Document Released: 02/24/2000 Document Revised: 02/15/2011 Document Reviewed:  06/06/2009 [facility] Patient Information 2012 [place].

## 2011-08-31 NOTE — Progress Notes (Signed)
Orthopedic Tech Progress Note Patient Details:  [name] 12-24-1945 [id number]  Ortho Devices Type of Ortho Device: Crutches Ortho Device/Splint Interventions: Application   Asia R Thompson 08/31/2011, 11:22 AM

## 2011-08-31 NOTE — ED Notes (Signed)
Ortho tech called 

## 2011-08-31 NOTE — ED Notes (Signed)
Patient transported to X-ray 

## 2011-08-31 NOTE — ED Provider Notes (Signed)
Medical screening examination/treatment/procedure(s) were performed by non-physician practitioner and as supervising physician I was immediately available for consultation/collaboration.   Saddie Benders. Dorna Mai, MD 08/31/11 2104
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
## 2011-08-31 NOTE — ED Notes (Signed)
Pt back from x-ray.

## 2011-09-06 DIAGNOSIS — E1049 Type 1 diabetes mellitus with other diabetic neurological complication: Secondary | ICD-10-CM | POA: Diagnosis not present

## 2011-09-06 DIAGNOSIS — M2040 Other hammer toe(s) (acquired), unspecified foot: Secondary | ICD-10-CM | POA: Diagnosis not present

## 2011-09-12 ENCOUNTER — Other Ambulatory Visit: Payer: Self-pay | Admitting: *Deleted

## 2011-09-25 DIAGNOSIS — H2589 Other age-related cataract: Secondary | ICD-10-CM | POA: Diagnosis not present

## 2011-09-25 DIAGNOSIS — E119 Type 2 diabetes mellitus without complications: Secondary | ICD-10-CM | POA: Diagnosis not present

## 2011-09-28 DIAGNOSIS — E119 Type 2 diabetes mellitus without complications: Secondary | ICD-10-CM | POA: Diagnosis not present

## 2011-09-28 DIAGNOSIS — I10 Essential (primary) hypertension: Secondary | ICD-10-CM | POA: Diagnosis not present

## 2011-09-28 DIAGNOSIS — N289 Disorder of kidney and ureter, unspecified: Secondary | ICD-10-CM | POA: Diagnosis not present

## 2011-09-28 DIAGNOSIS — I509 Heart failure, unspecified: Secondary | ICD-10-CM | POA: Diagnosis not present

## 2011-09-28 DIAGNOSIS — I2510 Atherosclerotic heart disease of native coronary artery without angina pectoris: Secondary | ICD-10-CM | POA: Diagnosis not present

## 2011-09-28 DIAGNOSIS — Z79899 Other long term (current) drug therapy: Secondary | ICD-10-CM | POA: Diagnosis not present

## 2011-10-02 DIAGNOSIS — N529 Male erectile dysfunction, unspecified: Secondary | ICD-10-CM | POA: Diagnosis not present

## 2011-10-02 DIAGNOSIS — N401 Enlarged prostate with lower urinary tract symptoms: Secondary | ICD-10-CM | POA: Diagnosis not present

## 2011-10-02 DIAGNOSIS — Z1371 Encounter for nonprocreative screening for genetic disease carrier status: Secondary | ICD-10-CM | POA: Diagnosis not present

## 2011-11-15 DIAGNOSIS — E119 Type 2 diabetes mellitus without complications: Secondary | ICD-10-CM | POA: Diagnosis not present

## 2011-11-15 DIAGNOSIS — I10 Essential (primary) hypertension: Secondary | ICD-10-CM | POA: Diagnosis not present

## 2011-11-15 DIAGNOSIS — I2510 Atherosclerotic heart disease of native coronary artery without angina pectoris: Secondary | ICD-10-CM | POA: Diagnosis not present

## 2011-11-15 DIAGNOSIS — Z125 Encounter for screening for malignant neoplasm of prostate: Secondary | ICD-10-CM | POA: Diagnosis not present

## 2011-11-15 DIAGNOSIS — R0609 Other forms of dyspnea: Secondary | ICD-10-CM | POA: Diagnosis not present

## 2011-11-15 DIAGNOSIS — I509 Heart failure, unspecified: Secondary | ICD-10-CM | POA: Diagnosis not present

## 2011-11-15 DIAGNOSIS — R0989 Other specified symptoms and signs involving the circulatory and respiratory systems: Secondary | ICD-10-CM | POA: Diagnosis not present

## 2011-11-15 DIAGNOSIS — F17200 Nicotine dependence, unspecified, uncomplicated: Secondary | ICD-10-CM | POA: Diagnosis not present

## 2011-11-15 DIAGNOSIS — I739 Peripheral vascular disease, unspecified: Secondary | ICD-10-CM | POA: Diagnosis not present

## 2011-11-15 DIAGNOSIS — E559 Vitamin D deficiency, unspecified: Secondary | ICD-10-CM | POA: Diagnosis not present

## 2011-12-07 DIAGNOSIS — F17200 Nicotine dependence, unspecified, uncomplicated: Secondary | ICD-10-CM | POA: Diagnosis not present

## 2011-12-07 DIAGNOSIS — I509 Heart failure, unspecified: Secondary | ICD-10-CM | POA: Diagnosis not present

## 2011-12-07 DIAGNOSIS — I10 Essential (primary) hypertension: Secondary | ICD-10-CM | POA: Diagnosis not present

## 2011-12-07 DIAGNOSIS — I2510 Atherosclerotic heart disease of native coronary artery without angina pectoris: Secondary | ICD-10-CM | POA: Diagnosis not present

## 2011-12-07 DIAGNOSIS — R0609 Other forms of dyspnea: Secondary | ICD-10-CM | POA: Diagnosis not present

## 2011-12-13 DIAGNOSIS — I2510 Atherosclerotic heart disease of native coronary artery without angina pectoris: Secondary | ICD-10-CM | POA: Diagnosis not present

## 2011-12-13 DIAGNOSIS — F17200 Nicotine dependence, unspecified, uncomplicated: Secondary | ICD-10-CM | POA: Diagnosis not present

## 2011-12-13 DIAGNOSIS — D649 Anemia, unspecified: Secondary | ICD-10-CM | POA: Diagnosis not present

## 2011-12-13 DIAGNOSIS — I509 Heart failure, unspecified: Secondary | ICD-10-CM | POA: Diagnosis not present

## 2011-12-13 DIAGNOSIS — N19 Unspecified kidney failure: Secondary | ICD-10-CM | POA: Diagnosis not present

## 2011-12-13 DIAGNOSIS — E559 Vitamin D deficiency, unspecified: Secondary | ICD-10-CM | POA: Diagnosis not present

## 2011-12-13 DIAGNOSIS — I10 Essential (primary) hypertension: Secondary | ICD-10-CM | POA: Diagnosis not present

## 2012-01-21 DIAGNOSIS — F524 Premature ejaculation: Secondary | ICD-10-CM | POA: Diagnosis not present

## 2012-01-21 DIAGNOSIS — N529 Male erectile dysfunction, unspecified: Secondary | ICD-10-CM | POA: Diagnosis not present

## 2012-02-14 DIAGNOSIS — D649 Anemia, unspecified: Secondary | ICD-10-CM | POA: Diagnosis not present

## 2012-02-14 DIAGNOSIS — Z23 Encounter for immunization: Secondary | ICD-10-CM | POA: Diagnosis not present

## 2012-02-14 DIAGNOSIS — I509 Heart failure, unspecified: Secondary | ICD-10-CM | POA: Diagnosis not present

## 2012-02-14 DIAGNOSIS — F17200 Nicotine dependence, unspecified, uncomplicated: Secondary | ICD-10-CM | POA: Diagnosis not present

## 2012-02-14 DIAGNOSIS — E559 Vitamin D deficiency, unspecified: Secondary | ICD-10-CM | POA: Diagnosis not present

## 2012-02-14 DIAGNOSIS — I2510 Atherosclerotic heart disease of native coronary artery without angina pectoris: Secondary | ICD-10-CM | POA: Diagnosis not present

## 2012-02-14 DIAGNOSIS — I10 Essential (primary) hypertension: Secondary | ICD-10-CM | POA: Diagnosis not present

## 2012-02-25 DIAGNOSIS — R634 Abnormal weight loss: Secondary | ICD-10-CM | POA: Diagnosis not present

## 2012-02-25 DIAGNOSIS — R197 Diarrhea, unspecified: Secondary | ICD-10-CM | POA: Diagnosis not present

## 2012-02-25 DIAGNOSIS — Z1211 Encounter for screening for malignant neoplasm of colon: Secondary | ICD-10-CM | POA: Diagnosis not present

## 2012-03-24 DIAGNOSIS — Z1211 Encounter for screening for malignant neoplasm of colon: Secondary | ICD-10-CM | POA: Diagnosis not present

## 2012-04-01 ENCOUNTER — Emergency Department (HOSPITAL_COMMUNITY): Payer: Medicare Other

## 2012-04-01 ENCOUNTER — Encounter (HOSPITAL_COMMUNITY)
Admission: EM | Disposition: A | Discharge status: Home Health | Payer: Self-pay | Source: Home / Self Care | Attending: Internal Medicine

## 2012-04-01 ENCOUNTER — Inpatient Hospital Stay (HOSPITAL_COMMUNITY)
Admission: EM | Admit: 2012-04-01 | Discharge: 2012-04-03 | DRG: 286 | Disposition: A | Discharge status: Home Health | Payer: Medicare Other | Attending: Internal Medicine | Admitting: Internal Medicine

## 2012-04-01 ENCOUNTER — Ambulatory Visit (HOSPITAL_COMMUNITY): Admit: 2012-04-01 | Payer: Self-pay | Admitting: Cardiovascular Disease

## 2012-04-01 ENCOUNTER — Encounter (HOSPITAL_COMMUNITY): Payer: Self-pay | Admitting: *Deleted

## 2012-04-01 DIAGNOSIS — Z794 Long term (current) use of insulin: Secondary | ICD-10-CM

## 2012-04-01 DIAGNOSIS — I501 Left ventricular failure: Secondary | ICD-10-CM

## 2012-04-01 DIAGNOSIS — Z7902 Long term (current) use of antithrombotics/antiplatelets: Secondary | ICD-10-CM

## 2012-04-01 DIAGNOSIS — I509 Heart failure, unspecified: Secondary | ICD-10-CM

## 2012-04-01 DIAGNOSIS — E089 Diabetes mellitus due to underlying condition without complications: Secondary | ICD-10-CM | POA: Diagnosis present

## 2012-04-01 DIAGNOSIS — I2510 Atherosclerotic heart disease of native coronary artery without angina pectoris: Secondary | ICD-10-CM | POA: Diagnosis not present

## 2012-04-01 DIAGNOSIS — R0989 Other specified symptoms and signs involving the circulatory and respiratory systems: Secondary | ICD-10-CM | POA: Diagnosis not present

## 2012-04-01 DIAGNOSIS — J811 Chronic pulmonary edema: Secondary | ICD-10-CM | POA: Diagnosis not present

## 2012-04-01 DIAGNOSIS — E119 Type 2 diabetes mellitus without complications: Secondary | ICD-10-CM | POA: Diagnosis not present

## 2012-04-01 DIAGNOSIS — Z79899 Other long term (current) drug therapy: Secondary | ICD-10-CM

## 2012-04-01 DIAGNOSIS — F1910 Other psychoactive substance abuse, uncomplicated: Secondary | ICD-10-CM | POA: Diagnosis present

## 2012-04-01 DIAGNOSIS — J441 Chronic obstructive pulmonary disease with (acute) exacerbation: Secondary | ICD-10-CM | POA: Diagnosis not present

## 2012-04-01 DIAGNOSIS — I5043 Acute on chronic combined systolic (congestive) and diastolic (congestive) heart failure: Principal | ICD-10-CM | POA: Diagnosis present

## 2012-04-01 DIAGNOSIS — N183 Chronic kidney disease, stage 3 unspecified: Secondary | ICD-10-CM | POA: Diagnosis present

## 2012-04-01 DIAGNOSIS — I252 Old myocardial infarction: Secondary | ICD-10-CM

## 2012-04-01 DIAGNOSIS — R079 Chest pain, unspecified: Secondary | ICD-10-CM | POA: Diagnosis not present

## 2012-04-01 DIAGNOSIS — I2589 Other forms of chronic ischemic heart disease: Secondary | ICD-10-CM | POA: Diagnosis present

## 2012-04-01 DIAGNOSIS — J9600 Acute respiratory failure, unspecified whether with hypoxia or hypercapnia: Secondary | ICD-10-CM

## 2012-04-01 DIAGNOSIS — Z951 Presence of aortocoronary bypass graft: Secondary | ICD-10-CM

## 2012-04-01 DIAGNOSIS — N2581 Secondary hyperparathyroidism of renal origin: Secondary | ICD-10-CM | POA: Diagnosis present

## 2012-04-01 DIAGNOSIS — N259 Disorder resulting from impaired renal tubular function, unspecified: Secondary | ICD-10-CM

## 2012-04-01 DIAGNOSIS — F1410 Cocaine abuse, uncomplicated: Secondary | ICD-10-CM

## 2012-04-01 DIAGNOSIS — R0602 Shortness of breath: Secondary | ICD-10-CM | POA: Diagnosis not present

## 2012-04-01 DIAGNOSIS — I129 Hypertensive chronic kidney disease with stage 1 through stage 4 chronic kidney disease, or unspecified chronic kidney disease: Secondary | ICD-10-CM | POA: Diagnosis present

## 2012-04-01 DIAGNOSIS — J449 Chronic obstructive pulmonary disease, unspecified: Secondary | ICD-10-CM | POA: Diagnosis present

## 2012-04-01 HISTORY — DX: Chest pain, unspecified: R07.9

## 2012-04-01 HISTORY — PX: LEFT HEART CATHETERIZATION WITH CORONARY ANGIOGRAM: SHX5451

## 2012-04-01 HISTORY — DX: Acute respiratory failure, unspecified whether with hypoxia or hypercapnia: J96.00

## 2012-04-01 LAB — RAPID URINE DRUG SCREEN, HOSP PERFORMED
Barbiturates: NOT DETECTED
Tetrahydrocannabinol: POSITIVE — AB

## 2012-04-01 LAB — POCT I-STAT, CHEM 8
BUN: 31 mg/dL — ABNORMAL HIGH (ref 6–23)
Calcium, Ion: 1.31 mmol/L — ABNORMAL HIGH (ref 1.13–1.30)
Chloride: 114 mEq/L — ABNORMAL HIGH (ref 96–112)
Glucose, Bld: 171 mg/dL — ABNORMAL HIGH (ref 70–99)

## 2012-04-01 LAB — POCT I-STAT 3, ART BLOOD GAS (G3+)
Acid-base deficit: 7 mmol/L — ABNORMAL HIGH (ref 0.0–2.0)
Bicarbonate: 19.2 mEq/L — ABNORMAL LOW (ref 20.0–24.0)
O2 Saturation: 99 %
TCO2: 20 mmol/L (ref 0–100)

## 2012-04-01 LAB — TSH: TSH: 1.826 u[IU]/mL (ref 0.350–4.500)

## 2012-04-01 LAB — BLOOD GAS, ARTERIAL
Delivery systems: POSITIVE
Drawn by: 32470
Inspiratory PAP: 12
pCO2 arterial: 42.3 mmHg (ref 35.0–45.0)
pH, Arterial: 7.35 (ref 7.350–7.450)
pO2, Arterial: 144 mmHg — ABNORMAL HIGH (ref 80.0–100.0)

## 2012-04-01 LAB — GLUCOSE, CAPILLARY
Glucose-Capillary: 109 mg/dL — ABNORMAL HIGH (ref 70–99)
Glucose-Capillary: 188 mg/dL — ABNORMAL HIGH (ref 70–99)

## 2012-04-01 LAB — LIPID PANEL
Cholesterol: 123 mg/dL (ref 0–200)
Triglycerides: 37 mg/dL (ref <150)

## 2012-04-01 LAB — HEPATIC FUNCTION PANEL
Alkaline Phosphatase: 171 U/L — ABNORMAL HIGH (ref 39–117)
Bilirubin, Direct: 0.2 mg/dL (ref 0.0–0.3)
Indirect Bilirubin: 0.4 mg/dL (ref 0.3–0.9)
Total Bilirubin: 0.6 mg/dL (ref 0.3–1.2)

## 2012-04-01 LAB — CBC
HCT: 34.9 % — ABNORMAL LOW (ref 39.0–52.0)
Hemoglobin: 12.3 g/dL — ABNORMAL LOW (ref 13.0–17.0)
MCH: 31.9 pg (ref 26.0–34.0)
MCHC: 35.2 g/dL (ref 30.0–36.0)

## 2012-04-01 LAB — HEMOGLOBIN A1C: Mean Plasma Glucose: 131 mg/dL — ABNORMAL HIGH (ref <117)

## 2012-04-01 LAB — TROPONIN I
Troponin I: <0.3 ng/mL (ref <0.30)
Troponin I: <0.3 ng/mL (ref <0.30)

## 2012-04-01 SURGERY — LEFT HEART CATHETERIZATION WITH CORONARY ANGIOGRAM
Anesthesia: LOCAL

## 2012-04-01 MED ORDER — ONDANSETRON HCL 4 MG PO TABS: 4.0000 mg | ORAL_TABLET | Freq: Four times a day (QID) | ORAL | Status: DC | PRN

## 2012-04-01 MED ORDER — LEVALBUTEROL HCL 0.63 MG/3ML IN NEBU
0.6300 mg | INHALATION_SOLUTION | Freq: Four times a day (QID) | RESPIRATORY_TRACT | Status: DC
  Administered 2012-04-01 (×2): 0.63 mg via RESPIRATORY_TRACT
  Filled 2012-04-01 (×5): qty 3

## 2012-04-01 MED ORDER — SODIUM CHLORIDE 0.9 % IV SOLN
INTRAVENOUS | Status: DC
  Administered 2012-04-02: 06:00:00 via INTRAVENOUS

## 2012-04-01 MED ORDER — FUROSEMIDE 10 MG/ML IJ SOLN
80.0000 mg | Freq: Once | INTRAMUSCULAR | Status: AC
  Administered 2012-04-01: 80 mg via INTRAVENOUS

## 2012-04-01 MED ORDER — ATORVASTATIN CALCIUM 40 MG PO TABS
40.0000 mg | ORAL_TABLET | Freq: Every day | ORAL | Status: DC
  Administered 2012-04-01 – 2012-04-02 (×2): 40 mg via ORAL
  Filled 2012-04-01 (×4): qty 1

## 2012-04-01 MED ORDER — SODIUM CHLORIDE 0.9 % IV SOLN: 250.0000 mL | INTRAVENOUS | Status: DC | PRN

## 2012-04-01 MED ORDER — LEVALBUTEROL HCL 0.63 MG/3ML IN NEBU
0.6300 mg | INHALATION_SOLUTION | RESPIRATORY_TRACT | Status: DC | PRN
  Filled 2012-04-01: qty 3

## 2012-04-01 MED ORDER — LEVALBUTEROL HCL 0.63 MG/3ML IN NEBU
0.6300 mg | INHALATION_SOLUTION | Freq: Two times a day (BID) | RESPIRATORY_TRACT | Status: DC
  Administered 2012-04-02 – 2012-04-03 (×2): 0.63 mg via RESPIRATORY_TRACT
  Filled 2012-04-01 (×5): qty 3

## 2012-04-01 MED ORDER — PNEUMOCOCCAL VAC POLYVALENT 25 MCG/0.5ML IJ INJ
0.5000 mL | INJECTION | INTRAMUSCULAR | Status: AC
  Administered 2012-04-02: 0.5 mL via INTRAMUSCULAR
  Filled 2012-04-01: qty 0.5

## 2012-04-01 MED ORDER — FUROSEMIDE 10 MG/ML IJ SOLN
40.0000 mg | Freq: Every day | INTRAMUSCULAR | Status: DC
  Administered 2012-04-01 – 2012-04-02 (×2): 40 mg via INTRAVENOUS
  Filled 2012-04-01 (×3): qty 4

## 2012-04-01 MED ORDER — SODIUM CHLORIDE 0.9 % IJ SOLN
3.0000 mL | Freq: Two times a day (BID) | INTRAMUSCULAR | Status: DC
  Administered 2012-04-01: 3 mL via INTRAVENOUS

## 2012-04-01 MED ORDER — ASPIRIN 81 MG PO CHEW
324.0000 mg | CHEWABLE_TABLET | ORAL | Status: AC
  Administered 2012-04-02: 324 mg via ORAL
  Filled 2012-04-01: qty 4

## 2012-04-01 MED ORDER — INSULIN ASPART 100 UNIT/ML ~~LOC~~ SOLN
0.0000 [IU] | SUBCUTANEOUS | Status: DC
  Administered 2012-04-01: 3 [IU] via SUBCUTANEOUS

## 2012-04-01 MED ORDER — OXYCODONE HCL 5 MG PO TABS: 5.0000 mg | ORAL_TABLET | Freq: Three times a day (TID) | ORAL | Status: DC | PRN

## 2012-04-01 MED ORDER — HYDRALAZINE HCL 25 MG PO TABS
25.0000 mg | ORAL_TABLET | Freq: Two times a day (BID) | ORAL | Status: DC
  Administered 2012-04-01 – 2012-04-03 (×5): 25 mg via ORAL
  Filled 2012-04-01 (×7): qty 1

## 2012-04-01 MED ORDER — ONDANSETRON HCL 4 MG/2ML IJ SOLN: 4.0000 mg | Freq: Four times a day (QID) | INTRAMUSCULAR | Status: DC | PRN

## 2012-04-01 MED ORDER — DIAZEPAM 5 MG PO TABS
5.0000 mg | ORAL_TABLET | ORAL | Status: AC
  Administered 2012-04-02: 5 mg via ORAL
  Filled 2012-04-01: qty 1

## 2012-04-01 MED ORDER — NITROGLYCERIN IN D5W 200-5 MCG/ML-% IV SOLN
5.0000 ug/min | Freq: Once | INTRAVENOUS | Status: AC
  Administered 2012-04-01: 10 ug/min via INTRAVENOUS
  Filled 2012-04-01: qty 250

## 2012-04-01 MED ORDER — OXYCODONE-ACETAMINOPHEN 5-325 MG PO TABS
1.0000 | ORAL_TABLET | Freq: Three times a day (TID) | ORAL | Status: DC | PRN
  Administered 2012-04-01: 1 via ORAL
  Filled 2012-04-01: qty 1

## 2012-04-01 MED ORDER — POTASSIUM CHLORIDE CRYS ER 20 MEQ PO TBCR
20.0000 meq | EXTENDED_RELEASE_TABLET | Freq: Two times a day (BID) | ORAL | Status: DC
  Administered 2012-04-01 – 2012-04-03 (×5): 20 meq via ORAL
  Filled 2012-04-01 (×6): qty 1

## 2012-04-01 MED ORDER — SODIUM CHLORIDE 0.9 % IJ SOLN
3.0000 mL | Freq: Two times a day (BID) | INTRAMUSCULAR | Status: DC
  Administered 2012-04-01 – 2012-04-02 (×2): 3 mL via INTRAVENOUS

## 2012-04-01 MED ORDER — DOCUSATE SODIUM 100 MG PO CAPS
100.0000 mg | ORAL_CAPSULE | Freq: Two times a day (BID) | ORAL | Status: DC
  Administered 2012-04-01 – 2012-04-03 (×5): 100 mg via ORAL
  Filled 2012-04-01 (×5): qty 1

## 2012-04-01 MED ORDER — AMLODIPINE BESYLATE 10 MG PO TABS
10.0000 mg | ORAL_TABLET | Freq: Every day | ORAL | Status: DC
  Administered 2012-04-01 – 2012-04-03 (×3): 10 mg via ORAL
  Filled 2012-04-01 (×3): qty 1

## 2012-04-01 MED ORDER — SODIUM CHLORIDE 0.9 % IJ SOLN: 3.0000 mL | INTRAMUSCULAR | Status: DC | PRN

## 2012-04-01 MED ORDER — FUROSEMIDE 10 MG/ML IJ SOLN
INTRAMUSCULAR | Status: AC
  Filled 2012-04-01: qty 8

## 2012-04-01 MED ORDER — ALBUTEROL SULFATE HFA 108 (90 BASE) MCG/ACT IN AERS
2.0000 | INHALATION_SPRAY | Freq: Four times a day (QID) | RESPIRATORY_TRACT | Status: DC | PRN
  Filled 2012-04-01: qty 6.7

## 2012-04-01 MED ORDER — TRAZODONE HCL 150 MG PO TABS
150.0000 mg | ORAL_TABLET | Freq: Every day | ORAL | Status: DC
  Administered 2012-04-01 – 2012-04-02 (×2): 150 mg via ORAL
  Filled 2012-04-01 (×4): qty 1

## 2012-04-01 MED ORDER — SODIUM CHLORIDE 0.9 % IJ SOLN: 3.0000 mL | Freq: Two times a day (BID) | INTRAMUSCULAR | Status: DC

## 2012-04-01 MED ORDER — BUDESONIDE 0.25 MG/2ML IN SUSP
0.2500 mg | Freq: Two times a day (BID) | RESPIRATORY_TRACT | Status: DC
  Administered 2012-04-01 – 2012-04-03 (×2): 0.25 mg via RESPIRATORY_TRACT
  Filled 2012-04-01 (×8): qty 2

## 2012-04-01 MED ORDER — ONDANSETRON HCL 4 MG/2ML IJ SOLN: 4.0000 mg | Freq: Three times a day (TID) | INTRAMUSCULAR | Status: AC | PRN

## 2012-04-01 MED ORDER — NITROGLYCERIN IN D5W 200-5 MCG/ML-% IV SOLN
5.0000 ug/min | Freq: Once | INTRAVENOUS | Status: DC
Start: 2012-04-01 — End: 2012-04-02

## 2012-04-01 MED ORDER — CLOPIDOGREL BISULFATE 75 MG PO TABS
75.0000 mg | ORAL_TABLET | Freq: Every day | ORAL | Status: DC
  Administered 2012-04-01 – 2012-04-03 (×3): 75 mg via ORAL
  Filled 2012-04-01 (×3): qty 1

## 2012-04-01 MED ORDER — OXYCODONE-ACETAMINOPHEN 10-325 MG PO TABS: 1.0000 | ORAL_TABLET | Freq: Three times a day (TID) | ORAL | Status: DC | PRN

## 2012-04-01 MED ORDER — HEPARIN SODIUM (PORCINE) 5000 UNIT/ML IJ SOLN
5000.0000 [IU] | Freq: Three times a day (TID) | INTRAMUSCULAR | Status: DC
  Administered 2012-04-01 – 2012-04-03 (×6): 5000 [IU] via SUBCUTANEOUS
  Filled 2012-04-01 (×11): qty 1

## 2012-04-01 NOTE — Progress Notes (Signed)
Patient ID: [name]  male  [id number]    DOB: 06/06/1945    DOA: 04/01/2012  PCP: No primary provider on file.  Assessment/Plan: Principal Problem:  *Pulmonary edema : Likely cardiogenic, history of coronary disease, CABG, hypertension, drug abuse UDS only positive for THC, has a history of cocaine abuse - On BiPAP, IV nitroglycerin, on IV Lasix, creatinine 1.8,  Patient diuresing well, -1 L since admission - Will repeat ABG, continue cardiac enzymes, 2-D echocardiogram (previous echo in 10/2009 showed EF of 35-40% mid stage III diastolic dysfunction) - Cardiology consult called, [place] for further recommendations    Active Problems:  DIABETES MELLITUS, TYPE II - Continue sliding scale, n.p.o. until BiPAP off  COPD exacerbation - Start on scheduled Xopenex, Pulmicort, chest x-ray negative for any pneumonia   poly-substance abuse/ABUSE, COCAINE, EPISODIC: UDS negative for cocaine   MYOCARDIAL INFARCTION, HX OF/  CORONARY ARTERY DISEASE - Follow cardiology consultation recommendations  DVT Prophylaxis: Heparin subcutaneous  Code Status: Full code  Disposition: not medically ready   Subjective: Alert and awake, diuresing well, no chest pain  Objective: Weight change:   Intake/Output Summary (Last 24 hours) at 04/01/12 0846 Last data filed at 04/01/12 0829  Gross per 24 hour  Intake      0 ml  Output   1040 ml  Net  -1040 ml   Blood pressure 137/81, pulse 65, temperature 97.3 F (36.3 C), temperature source Axillary, resp. rate 14, height 5\' 8"  (1.727 m), weight 72.1 kg (158 lb 15.2 oz), SpO2 100.00%.  Physical Exam: General: Alert and awake, oriented x3, not in any acute distress. HEENT: anicteric sclera, pupils reactive to light and accommodation, EOMI CVS: S1-S2 clear, no murmur rubs or gallops Chest: Bilateral rhonchi with crackles throughout Abdomen: soft nontender, nondistended, normal bowel sounds, no organomegaly Extremities: no cyanosis, clubbing.  1+ edema noted bilaterally Neuro: Cranial nerves II-XII intact, no focal neurological deficits  Lab Results: Basic Metabolic Panel:  Lab [id number]  NA 142  K 3.9  CL 114*  CO2 --  GLUCOSE 171*  BUN 31*  CREATININE 1.80*  CALCIUM --  MG --  PHOS --   CBC:  Lab 04/01/12 0352 04/01/12 0350  WBC -- 4.2  NEUTROABS -- --  HGB 12.9* 12.3*  HCT 38.0* 34.9*  MCV -- 90.6  PLT -- 131*   Cardiac Enzymes:  Lab 04/01/12 0645  CKTOTAL --  CKMB --  CKMBINDEX --  TROPONINI <0.30   BNP: No components found with this basename: POCBNP:2 CBG:  Lab 04/01/12 0820 04/01/12 0631  GLUCAP 109* 122*     Micro Results: Recent Results (from the past 240 hour(s))  MRSA PCR SCREENING     Status: Normal   Collection Time   04/01/12  6:29 AM      Component Value Range Status Comment   MRSA by PCR NEGATIVE  NEGATIVE Final     Studies/Results: Dg Chest Portable 1 View  04/01/2012  *RADIOLOGY REPORT*  Clinical Data: Shortness of breath and chest pain.  PORTABLE CHEST - 1 VIEW  Comparison: Chest radiograph performed 07/26/2011  Findings: The lungs are well-aerated.  Vascular congestion is noted, with mildly increased interstitial markings, suggestive of mild interstitial edema.  There is no evidence of pleural effusion or pneumothorax.  The cardiomediastinal silhouette is mildly enlarged; the patient is status post median sternotomy, with evidence of prior CABG.  No acute osseous abnormalities are seen.  IMPRESSION: Vascular congestion and mild cardiomegaly, with mildly increased interstitial markings, suggestive  of mild interstitial edema.   Original Report Authenticated By: Santa Lighter, M.D.     Medications: Scheduled Meds:   . amLODipine  10 mg Oral Daily  . clopidogrel  75 mg Oral Daily  . docusate sodium  100 mg Oral BID  . furosemide  40 mg Intravenous Daily  . heparin  5,000 Units Subcutaneous Q8H  . hydrALAZINE  25 mg Oral BID  . insulin aspart  0-15 Units Subcutaneous Q4H    . potassium chloride SA  20 mEq Oral BID  . sodium chloride  3 mL Intravenous Q12H  . sodium chloride  3 mL Intravenous Q12H  . traZODone  150 mg Oral QHS    Time spent examining patient, management, cardiology consultation: 48 mins  LOS: 0 days   RAI,RIPUDEEP M.D. [facility] 04/01/2012, 8:46 AM Pager: [phone]  If 7PM-7AM, please contact night-coverage [email or web address] Password [email or web address]
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
## 2012-04-01 NOTE — ED Notes (Signed)
Dr. Truman Hayward in to see patient instructed to start NTG at 20mcg
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
## 2012-04-01 NOTE — Progress Notes (Signed)
  Echocardiogram 2D Echocardiogram has been performed.  Ardelle Balls A 04/01/2012, 11:33 AM

## 2012-04-01 NOTE — Progress Notes (Signed)
Utilization review completed.  
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
## 2012-04-01 NOTE — ED Notes (Addendum)
EMS states called out for CP, upon arrival pt was having difficulty breathing.  Given 325 ASA, 1 nitro.  Some CP relief.  Started on CPAP by EMS.  Pt was on NRB on EMS arrival.

## 2012-04-02 ENCOUNTER — Ambulatory Visit (HOSPITAL_COMMUNITY): Admission: RE | Admit: 2012-04-02 | Payer: Medicare Other | Source: Ambulatory Visit | Admitting: Internal Medicine

## 2012-04-02 ENCOUNTER — Encounter (HOSPITAL_COMMUNITY)
Admission: EM | Disposition: A | Discharge status: Home Health | Payer: Self-pay | Source: Home / Self Care | Attending: Internal Medicine

## 2012-04-02 DIAGNOSIS — J9600 Acute respiratory failure, unspecified whether with hypoxia or hypercapnia: Secondary | ICD-10-CM | POA: Diagnosis not present

## 2012-04-02 DIAGNOSIS — I2510 Atherosclerotic heart disease of native coronary artery without angina pectoris: Secondary | ICD-10-CM | POA: Diagnosis not present

## 2012-04-02 DIAGNOSIS — I509 Heart failure, unspecified: Secondary | ICD-10-CM | POA: Diagnosis not present

## 2012-04-02 DIAGNOSIS — J811 Chronic pulmonary edema: Secondary | ICD-10-CM | POA: Diagnosis not present

## 2012-04-02 HISTORY — PX: LEFT AND RIGHT HEART CATHETERIZATION WITH CORONARY/GRAFT ANGIOGRAM: SHX5448

## 2012-04-02 LAB — CBC
HCT: 34.3 % — ABNORMAL LOW (ref 39.0–52.0)
MCHC: 34.1 g/dL (ref 30.0–36.0)
MCV: 89.4 fL (ref 78.0–100.0)
Platelets: 125 10*3/uL — ABNORMAL LOW (ref 150–400)
Platelets: 142 10*3/uL — ABNORMAL LOW (ref 150–400)
RBC: 4.35 MIL/uL (ref 4.22–5.81)
RDW: 13.4 % (ref 11.5–15.5)
WBC: 3.2 10*3/uL — ABNORMAL LOW (ref 4.0–10.5)
WBC: 3.2 10*3/uL — ABNORMAL LOW (ref 4.0–10.5)

## 2012-04-02 LAB — CREATININE, SERUM
Creatinine, Ser: 1.59 mg/dL — ABNORMAL HIGH (ref 0.50–1.35)
GFR calc Af Amer: 51 mL/min — ABNORMAL LOW (ref >90)
GFR calc non Af Amer: 44 mL/min — ABNORMAL LOW (ref >90)

## 2012-04-02 LAB — POCT I-STAT 3, ART BLOOD GAS (G3+)
Bicarbonate: 24.5 mEq/L — ABNORMAL HIGH (ref 20.0–24.0)
O2 Saturation: 96 %
TCO2: 26 mmol/L (ref 0–100)
pCO2 arterial: 37 mmHg (ref 35.0–45.0)
pO2, Arterial: 77 mmHg — ABNORMAL LOW (ref 80.0–100.0)

## 2012-04-02 LAB — BASIC METABOLIC PANEL
BUN: 27 mg/dL — ABNORMAL HIGH (ref 6–23)
Calcium: 9.7 mg/dL (ref 8.4–10.5)
Chloride: 105 mEq/L (ref 96–112)
Creatinine, Ser: 1.66 mg/dL — ABNORMAL HIGH (ref 0.50–1.35)
GFR calc Af Amer: 48 mL/min — ABNORMAL LOW (ref >90)
GFR calc non Af Amer: 42 mL/min — ABNORMAL LOW (ref >90)

## 2012-04-02 LAB — GLUCOSE, CAPILLARY
Glucose-Capillary: 105 mg/dL — ABNORMAL HIGH (ref 70–99)
Glucose-Capillary: 101 mg/dL — ABNORMAL HIGH (ref 70–99)
Glucose-Capillary: 84 mg/dL (ref 70–99)
Glucose-Capillary: 104 mg/dL — ABNORMAL HIGH (ref 70–99)

## 2012-04-02 LAB — PRO B NATRIURETIC PEPTIDE: Pro B Natriuretic peptide (BNP): 4151 pg/mL — ABNORMAL HIGH (ref 0–125)

## 2012-04-02 LAB — POCT I-STAT 3, VENOUS BLOOD GAS (G3P V)
Acid-Base Excess: 1 mmol/L (ref 0.0–2.0)
Bicarbonate: 26.3 mEq/L — ABNORMAL HIGH (ref 20.0–24.0)
pCO2, Ven: 41.8 mmHg — ABNORMAL LOW (ref 45.0–50.0)
pH, Ven: 7.407 — ABNORMAL HIGH (ref 7.250–7.300)
pO2, Ven: 33 mmHg (ref 30.0–45.0)

## 2012-04-02 LAB — PROTIME-INR
INR: 1.09 (ref 0.00–1.49)
Prothrombin Time: 14 seconds (ref 11.6–15.2)

## 2012-04-02 SURGERY — LEFT AND RIGHT HEART CATHETERIZATION WITH CORONARY/GRAFT ANGIOGRAM
Anesthesia: LOCAL

## 2012-04-02 MED ORDER — CARVEDILOL 3.125 MG PO TABS
3.1250 mg | ORAL_TABLET | Freq: Two times a day (BID) | ORAL | Status: DC
  Filled 2012-04-02 (×2): qty 1

## 2012-04-02 MED ORDER — LIDOCAINE HCL (PF) 1 % IJ SOLN
INTRAMUSCULAR | Status: AC
  Filled 2012-04-02: qty 30

## 2012-04-02 MED ORDER — NITROGLYCERIN 0.2 MG/ML ON CALL CATH LAB
INTRAVENOUS | Status: AC
  Filled 2012-04-02: qty 1

## 2012-04-02 MED ORDER — HEPARIN (PORCINE) IN NACL 2-0.9 UNIT/ML-% IJ SOLN
INTRAMUSCULAR | Status: AC
  Filled 2012-04-02: qty 1000

## 2012-04-02 MED ORDER — OXYCODONE HCL 5 MG PO TABS: 5.0000 mg | ORAL_TABLET | ORAL | Status: DC | PRN

## 2012-04-02 MED ORDER — SODIUM CHLORIDE 0.9 % IJ SOLN: 3.0000 mL | INTRAMUSCULAR | Status: DC | PRN

## 2012-04-02 MED ORDER — FENTANYL CITRATE 0.05 MG/ML IJ SOLN
INTRAMUSCULAR | Status: AC
  Filled 2012-04-02: qty 2

## 2012-04-02 MED ORDER — MORPHINE SULFATE 10 MG/ML IJ SOLN: 1.0000 mg | INTRAMUSCULAR | Status: DC | PRN

## 2012-04-02 MED ORDER — ACETAMINOPHEN 325 MG PO TABS: 650.0000 mg | ORAL_TABLET | Freq: Four times a day (QID) | ORAL | Status: DC | PRN

## 2012-04-02 MED ORDER — SODIUM CHLORIDE 0.9 % IV SOLN: 1.0000 mL/kg/h | INTRAVENOUS | Status: AC

## 2012-04-02 MED ORDER — SODIUM CHLORIDE 0.9 % IJ SOLN: 3.0000 mL | Freq: Two times a day (BID) | INTRAMUSCULAR | Status: DC

## 2012-04-02 MED ORDER — SODIUM CHLORIDE 0.9 % IV SOLN: 250.0000 mL | INTRAVENOUS | Status: DC | PRN

## 2012-04-02 MED ORDER — NITROGLYCERIN IN D5W 200-5 MCG/ML-% IV SOLN: 10.0000 ug/min | INTRAVENOUS | Status: DC

## 2012-04-02 NOTE — Progress Notes (Signed)
Pt transferred to stretcher, rn and transported at bedside with monitor. Pt going to cath lab. Suezanne Cheshire

## 2012-04-02 NOTE — Clinical Documentation Improvement (Signed)
RENAL FAILURE DOCUMENTATION CLARIFICATION QUERY  THIS DOCUMENT IS NOT A PERMANENT PART OF THE MEDICAL RECORD  Please update your documentation within the medical record to reflect your response to this query.                                                                                    04/02/12  Dear Dr.Kaleb Linquist,  In a better effort to capture your patient's severity of illness, reflect appropriate length of stay and utilization of resources, a review of the patient medical record has revealed the following indicators.   Based on your clinical judgment, please clarify and document in a progress note and/or discharge summary the clinical condition associated with the following supporting information: In responding to this query please exercise your independent judgment.  The fact that a query is asked, does not imply that any particular answer is desired or expected.   "moderate renal failure" was documented in the consulte note on 1/21 doe Tregg. If possible, could you please determine the acuity of renal failure for this patient and document it in the progress note. Thank you for your time.  Possible Clinical Conditions?  - ACUTE Renal Failure  - CHRONIC Renal Failure  - ACUTE ON CHRONIC Renal Failure  - Other condition (please document in the progress notes and/or discharge summary)  - Cannot Clinically determine at this time      Reviewed: additional documentation in the medical record   Thank You,  [place] Documentation Specialist: [phone] Pager  [place] - documented

## 2012-04-02 NOTE — Progress Notes (Signed)
[facility] Progress Note [place] TEAM 1 - Stepdown/ICU TEAM   Lui Wageman [id number] DOB: 1945-03-26 DOA: 04/01/2012 PCP: No primary provider on file.  Brief narrative: 67 y.o. male with hx of polysubstance abuse, including marijuana and cocaine, CHF, HTN, known CAD s/p CABG, DM, presented to the ER with severe shortness of breath. He denied any chest pain, cough, fever or chills. Evaluation in the ER included an EKG which showed LBBB, normal initial troponin, BS of 171, Cr of 1.8, and CXR showed vascular congestion with interstitial edema and cardiomegaly. He was given IV Lasix and placed on Bipap. His initial BP was elevated, but improved to 140/70. He felt better on Bipap.  Assessment/Plan:  Acute pulmonary edema / acute on chronic systolic CHF / Ischemic cardiomyopathy Patient has rapidly improved with diuresis - for cardiac cath today to assess for reaccumulation of coronary disease - blood pressure reasonably controlled at present time  CAD hx of CABG Cardiology is following - for cardiac cath today - no evidence of acute coronary syndrome  CKD stage III Nephrology is following - renal function is stable at this time  HTN Reasonably controlled at this time - resume home beta blocker and follow  COPD Well compensated at this time - patient has not smoked tobacco in many years  Polysubstance Abuse (marijuana, cocaine) Patient advised in no uncertain terms that he must discontinue all drugs of abuse - he admits to actively smoking marijuana but states that he has not used cocaine in over 2 years  DM2 CBGs well-controlled at present time - A1c suggest reasonable control in outpatient setting  Code Status: FULL Disposition Plan: Stable for telemetry unit  Consultants: Cardiology - [facility]  Procedures: TTE - 04/01/2012 - EF 2530% - mild LVH - hypokinesis of IL, I, IS walls - grade 2 diastolic dysfxn  Antibiotics: none  DVT prophylaxis: Sub Q  heparin  HPI/Subjective: The patient reports that he feels much better at this time.  Her shortness of breath has resolved.  He denies current chest pain nausea vomiting or abdominal pain.   Objective: Blood pressure 144/74, pulse 67, temperature 97.6 F (36.4 C), temperature source Oral, resp. rate 20, height 5\' 8"  (1.727 m), weight 71.4 kg (157 lb 6.5 oz), SpO2 99.00%.  Intake/Output Summary (Last 24 hours) at 04/02/12 1337 Last data filed at 04/02/12 1200  Gross per 24 hour  Intake 1474.07 ml  Output   3700 ml  Net -2225.93 ml     Exam: General: No acute respiratory distress at rest Lungs: Clear to auscultation bilaterally without wheezes or crackles Cardiovascular: Regular rate and rhythm without murmur gallop or rub - normal S1 and S2 Abdomen: Nontender, nondistended, soft, bowel sounds positive, no rebound, no ascites, no appreciable mass Extremities: No significant cyanosis, clubbing, or edema bilateral lower extremities  Data Reviewed: Basic Metabolic Panel:  Lab [id number] 04/01/12 0352  NA 140 142  K 3.7 3.9  CL 105 114*  CO2 23 --  GLUCOSE 104* 171*  BUN 27* 31*  CREATININE 1.66* 1.80*  CALCIUM 9.7 --  MG -- --  PHOS -- --   Liver Function Tests:  Lab 04/01/12 1359  AST 19  ALT 21  ALKPHOS 171*  BILITOT 0.6  PROT 7.2  ALBUMIN 3.6   CBC:  Lab 04/02/12 0439 04/01/12 0352 04/01/12 0350  WBC 3.2* -- 4.2  NEUTROABS -- -- --  HGB 11.7* 12.9* 12.3*  HCT 34.3* 38.0* 34.9*  MCV 89.3 -- 90.6  PLT 125* -- 131*   Cardiac Enzymes:  Lab 04/01/12 1838 04/01/12 1359 04/01/12 0645  CKTOTAL -- -- --  CKMB -- -- --  CKMBINDEX -- -- --  TROPONINI <0.30 <0.30 <0.30   BNP (last 3 results)  Basename 04/02/12 0439 04/01/12 0350  PROBNP 4151.0* 3094.0*   CBG:  Lab 04/02/12 1238 04/02/12 0738 04/02/12 0411 04/02/12 0037 04/01/12 2027  GLUCAP 97 101* 105* 101* 104*    Recent Results (from the past 240 hour(s))  MRSA PCR SCREENING     Status: Normal     Collection Time   04/01/12  6:29 AM      Component Value Range Status Comment   MRSA by PCR NEGATIVE  NEGATIVE Final      Studies:  Recent x-ray studies have been reviewed in detail by the Attending Physician  Scheduled Meds:  Reviewed in detail by the Attending Physician   Cherene Altes, MD [facility] Office  [phone] Pager [phone]  On-Call/Text Page:      [email or web address]      password [email or web address]  If 7PM-7AM, please contact night-coverage [email or web address] Password [facility] 04/02/2012, 1:37 PM   LOS: 1 day

## 2012-04-02 NOTE — CV Procedure (Signed)
[facility] CARDI[facility] CATHETERIZATION REPORT  NAME:  [name]   MRN: [id number] DOB:  1945-12-23   ADMIT DATE: 04/01/2012 Procedure Date: 04/02/2012  INTERVENTIONAL CARDIOLOGIST: Leonie Man, M.D., MS PRIMARY CARE PROVIDER: No primary provider on file. PRIMARY CARDIOLOGIST: Sanda Klein, MD   PATIENT:  [name] is a 67 y.o. male withhx of polysubstance abuse, including THC and cocaine, CHF, HTN, known CAD s/p CABG 2006 X 3 (for severe Left Main disease, occluded distal RCA, and occluded circumflex and a previously placed 3.0 x 18 motor Cypher DES stent.), DM- has not used insulin in a year, presented to the ER with severe shortness of breath. He denied any chest pain in the ER but this AM he states when he was awakened from sleep he had Lt. Ant Chest pain. Demied cough, fever or chills. Evaluation in the ER included an EKG which showed LBBB, normal initial troponin, BS of 171, Cr of 1.8, and CXR showed vascular congestion with interstitial edema and cardiomegaly. He was given IV Lasix and placed on Bipap. His initial BP was elevated, but improved to 140/70. He felt better on Bipap after diuresis.  His renal function has stabilized & symptoms have improved.  Echocardiography indicated new drop in EF.  With SSx consistent with Angina & Acute Systolic/Diastolic HF, he was referred for L&RHC prior to determining ICD placement vs. LifeVest.  PRE-OPERATIVE DIAGNOSIS:    Angina at rest  Newly decreased EF, suggestive of ICM  Prior CABG x 3 (LIMA-LAD, SVG-OM & SVG-rPDA for LM & distal RCA disease.  PROCEDURES PERFORMED:    Left Heart Catheterization with Native Coronary & Graft Angiography via 5Fr RFA Access  Right Heart Catheterization via 7Fr RFV Access.  PROCEDURE: Consent:  Risks of procedure as well as the alternatives and risks of each were explained to the (patient/caregiver).  Consent for procedure obtained.  The patient was brought to the [facility]nd [facility] in the fasting state and prepped and draped in the usual sterile fashion for Right groin access. Sterile technique was used including antiseptics, cap, gloves, gown, hand hygiene, mask and sheet.  Skin prep: Chlorhexidine.  Time Out: Verified patient identification, verified procedure, site/side was marked, verified correct patient position, special equipment/implants available, medications/allergies/relevent history reviewed, required imaging and test results available.  Performed  Access:   Right Common Femoral Artery: 5 Fr Sheath - fluoroscopically guided modified Seldinger Technique  Right Common Femoral Vein: 7 Fr Sheath - Seldinger Technique.  Right Heart Catheterization:  7Fr Gordy Councilman catheter advanced under fluoroscopy with balloon inflated to the RA, RV, then PCWP-PA for hemodynamic measurement.  Simultaneous FA & PA blood gases checked for SaO2% to calculate FICK CO/CI  Thermodilution Injections performed to calculate CO/CI  Simultaneous PCWP/LV & RV/LV pressures monitored with Angled Pigtail in LV.  Catheter removed completely out of the body with balloon deflated.  Left Heart Catheterization:  5Fr Catheters advanced or exchanged over a J-wire; Angled Pigtail advanced first.  LV Hemodynamics (LV Gram): Angled Pigtail  Left Coronary Artery Cineangiography: JL4 Catheter  Right Coronary Artery, SVG-RCA & SVG-OM Cineangiography: JR4 Catheter  LIMA-LAD Cineangiography: JR4 Catheter redirected into Left Subclavian Artery & exchanged over long-exchange wire for IMA catheter.  Sheath through the in the holding area with manual pressure for hemostasis.    ANESTHESIA:   Local Lidocaine 18 ml SEDATION:  2 mg IV Versed, 50 mcg IV fentanyl ; Premedication: 5 mg oral Valium MEDICATIONS: Omnipaque contrast 85  mL  EBL:   < 10 ml, not including ABG and VBG samples  FINDINGS:  Hemodynamics: Findings:  SaO2% Pressures mmHg Mean P mmHg  EDP mmHg      Right Atrium   2/5    1       Right Ventricle   33/0   1       Pulmonary Artery  64  35/10   18        PCWP   23/25   20        Central Aortic  96   154/74  103        Left Ventricle   149/1    7          Cardiac Output:  Cardiac Index:       Fick  4.81    2.61        Thermodilution  2.53    1.38    Minimal discordance noted between LV and RV pressures, not sufficient to suggest constrictive pericarditis.   Left Ventriculography: Not performed to conserve contrast.  Coronary Anatomy:  Left Main: Large-caliber vessel with ostial 50-60% stenosis. Bifurcates distally into LAD and Circumflex. LAD: Large-caliber vessel with ostial 10% stenosis, there is several septal perforators it off proximally as well as a small first diagonal (D1) branch that comes off in tandem with the septal trunk. At this portion there is a 50-60% stenosis followed by yet another double branch point for an even larger septal trunk (SP 3) and D2. There is then diffuse mild disease downstream with then 2 of flow flow from the LIMA graft which back fills D3.  D1: Small to moderate caliber bifurcating vessel roughly 1.5-1.8 mm in diameter. No significant lesions.  D2: Moderate caliber vessel with a proximal 50-60% lesion before bifurcates. This vessel covers a large portion of the anterolateral wall with no significant lesions beyond the proximal lesion. Left Circumflex: Large-caliber vessel 2 proximal stents first has significant 60-70% in-stent restenosis in the second is 100 and occluded AV groove. This is at the branch point of what appears to be OM1 and OM 2. OM1 is a small branch with O2 being more significant. There is evidence of previously placed Cypher stent in the OM 2. There is to and fro flow down the inferior branch of OM 2 suggesting a patent vein graft. Ramus intermedius: Large-caliber vessel which courses as a large obtuse marginal. There is mild ostial involvement but otherwise vessels  continues on minimal luminal irregularities but 40% early mid. The vessel then goes off several branches before reaches down almost to the infero-lateral apex.  Diffuse mild luminal irregularities in the downstream vessel.  RCA: Apparent a moderate caliber. The document is dominant vessel with proximal 70% followed by another set 60-70% stenosis. The vessel is an occluded in the mid vessel at the genu where there appears to be a stent. There is very trace flow distally beyond the RV marginal branch, making the vessel functionally occluded with no evidence of to/fro flow from the vein graft.  Graft anatomy:  LIMA-LAD: Widely patent graft to the distal LAD. The distal LAD has apical severe diffuse 60-70 followed by a roughly 90% stenosis with very minimal downstream LAD flow. There is more flow retrograde perfusing a small D3 as well as D2. The graft itself is tortuous but with no lesions.  SVG-RCA: Widely patent large-caliber graft to appears to the distal RCA. There is minimal retrograde flow into the posterior lateral system. The  PDA is a small branching vessel that does not reach the apex. There is diffuse distal disease in the vessel it is considerably smaller than the graft itself, being roughly 1-1.5 mm in diameter.  SVG-OM: Widely patent large-caliber graft that touches down just before the OM bifurcates. There is minimal disease downstream in the OM. The flow then goes retrograde through patent stent preceded by 70-80 % stenosis in the proximal OM before reaches the AV groove circumflex occlusion site. There is then flow going down the AV groove circumflex into a small posterior lateral branch.  PATIENT DISPOSITION:    The patient was transferred to the PACU holding area in a hemodynamicaly stable, chest pain free condition.  The patient tolerated the procedure well, and there were no complications.  The patient was stable before, during, and after the procedure.  POST-OPERATIVE DIAGNOSIS:     Severe native coronary artery disease involving left main stenosis as well as occluded RCA and Circumflex, with moderate-to-severe LAD disease.  3 of 3 patent grafts, with severe diffuse downstream disease in the distal RCA/RPDA as well as the LAD post grafting.  Notable discrepancy between Fick and thermodilution cardiac output.  Well diuresis/compensated cardiomyopathy LVEDP of 7 mmHg but with a wedge pressure of roughly 20 mmHg suggesting some complement of mitral regurgitation.  No clear-cut culprit lesion for the patient's resting anginal pain.  PLAN OF CARE:  Standard post catheterization care. We nitroglycerin infusion overnight followed by titration of cardiac medications including beta blockers and ACE inhibitors if renal function is stable.  Per discussion with Dr. Sallyanne Kuster, we'll need to determine his candidacy for ICD therapy, but will not use LifeVest unless ICD placement is delayed.   Leonie Man, M.D., M.S. [facility] [address] [place], [place]  [address]  [phone]  04/02/2012 4:02 PM
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
## 2012-04-02 NOTE — Progress Notes (Signed)
Subjective: No complaints, looks better, no chest pain, NTG was for CHF  Objective: Vital signs in last 24 hours: Temp:  [97.1 F (36.2 C)-98.6 F (37 C)] 97.4 F (36.3 C) (01/22 0739) Pulse Rate:  [56-80] 56  (01/22 0739) Resp:  [11-31] 14  (01/22 0739) BP: (125-158)/(52-82) 154/63 mmHg (01/22 0739) SpO2:  [95 %-100 %] 100 % (01/22 0739) Weight:  [71.4 kg (157 lb 6.5 oz)] 71.4 kg (157 lb 6.5 oz) (01/21 1606) Weight change: -0.7 kg (-1 lb 8.7 oz) Last BM Date: 03/31/12 Intake/Output from previous day: 01/21 0701 - 01/22 0700 In: 1726.3 [P.O.:1320; I.V.:402.3; IV Piggyback:4] Out: 5400 [Urine:5400] Intake/Output this shift: Total I/O In: 99.6 [I.V.:99.6] Out: 0   PE: General:alert and oriented no complaints Heart:S1S2 RRR Lungs:with rhonchi but improved from yesterday Abd:+ BS  Ext:no to tr edema    Lab Results:  Basename 04/02/12 0439 04/01/12 0352 04/01/12 0350  WBC 3.2* -- 4.2  HGB 11.7* 12.9* --  HCT 34.3* 38.0* --  PLT 125* -- 131*   BMET  Basename 04/02/12 0439 04/01/12 0352  NA 140 142  K 3.7 3.9  CL 105 114*  CO2 23 --  GLUCOSE 104* 171*  BUN 27* 31*  CREATININE 1.66* 1.80*  CALCIUM 9.7 --    Basename 04/01/12 1838 04/01/12 1359  TROPONINI <0.30 <0.30    Lab Results  Component Value Date   CHOL 123 04/01/2012   HDL 78 04/01/2012   LDLCALC 38 04/01/2012   TRIG 37 04/01/2012   CHOLHDL 1.6 04/01/2012   Lab Results  Component Value Date   HGBA1C 6.2* 04/01/2012     Lab Results  Component Value Date   TSH 1.826 04/01/2012    Hepatic Function Panel  Basename 04/01/12 1359  PROT 7.2  ALBUMIN 3.6  AST 19  ALT 21  ALKPHOS 171*  BILITOT 0.6  BILIDIR 0.2  IBILI 0.4    Basename 04/01/12 1359  CHOL 123   Studies/Results: Dg Chest Portable 1 View  04/01/2012  *RADIOLOGY REPORT*  Clinical Data: Shortness of breath and chest pain.  PORTABLE CHEST - 1 VIEW  Comparison: Chest radiograph performed 07/26/2011  Findings: The lungs are  well-aerated.  Vascular congestion is noted, with mildly increased interstitial markings, suggestive of mild interstitial edema.  There is no evidence of pleural effusion or pneumothorax.  The cardiomediastinal silhouette is mildly enlarged; the patient is status post median sternotomy, with evidence of prior CABG.  No acute osseous abnormalities are seen.  IMPRESSION: Vascular congestion and mild cardiomegaly, with mildly increased interstitial markings, suggestive of mild interstitial edema.   Original Report Authenticated By: Santa Lighter, M.D.    2D Echo:Left ventricle: The cavity size was normal. Wall thickness was increased in a pattern of mild LVH. Systolic function was severely reduced. The estimated ejection fraction was in the range of 25% to 30%. There is moderate hypokinesis of the inferolateral, inferior, and inferoseptal myocardium; consistent with ischemia or infarction in the distribution of the right coronary and left circumflex coronary artery. Features are consistent with a pseudonormal left ventricular filling pattern, with concomitant abnormal relaxation and increased filling pressure (grade 2 diastolic dysfunction). Doppler parameters are consistent with elevated mean left atrial filling pressure. - Mitral valve: Mild regurgitation. - Left atrium: The atrium was mildly dilated. - Atrial septum: The septum bowed from left to right, consistent with increased left atrial pressure. No defect or patent foramen ovale was identified.   Medications: I have reviewed the patient's current medications.    Marland Kitchen  amLODipine  10 mg Oral Daily  . atorvastatin  40 mg Oral q1800  . budesonide  0.25 mg Nebulization BID  . clopidogrel  75 mg Oral Daily  . diazepam  5 mg Oral On Call  . docusate sodium  100 mg Oral BID  . furosemide  40 mg Intravenous Daily  . heparin  5,000 Units Subcutaneous Q8H  . hydrALAZINE  25 mg Oral BID  . insulin aspart  0-15 Units Subcutaneous Q4H  .  levalbuterol  0.63 mg Nebulization BID  . nitroGLYCERIN  5 mcg/min Intravenous Once  . pneumococcal 23 valent vaccine  0.5 mL Intramuscular Tomorrow-1000  . potassium chloride SA  20 mEq Oral BID  . sodium chloride  3 mL Intravenous Q12H  . sodium chloride  3 mL Intravenous Q12H  . sodium chloride  3 mL Intravenous Q12H  . traZODone  150 mg Oral QHS   Assessment/Plan: Principal Problem:  *Acute respiratory failure, requiring BiPap, now with diuresing improved. Active Problems:  Pulmonary edema   Chest pain at rest  CORONARY ARTERY DISEASE, CABG 2006, X 3 LIMA-LAD, SVG-LCX OM, VG-PDA  DIABETES MELLITUS, TYPE II  SUBSTANCE ABUSE, MULTIPLE  MYOCARDIAL INFARCTION, HX OF  RENAL INSUFFICIENCY  COPD with exacerbation  PLAN: new drop in EF will proceed with cardiac cath today to evaluate.  Pro BNP slightly more elevated.  ? Need for rt heart cath. troponins neg  Total chol at 123, TG at 37 HDL 78 and LDL 38! Lipitor added yesterday.  Drug screen positive for TSH no cocaine  Change IV NTG to po after cath.    LOS: 1 day   INGOLD,LAURA R 04/02/2012, 9:25 AM  I have seen and evaluated the patient this PM along with Cecilie Kicks, NP. I agree with her findings, examination as well as impression recommendations.  As per Dr. Victorino December request - plan R&LHC today to investigated worsening EF (likley diagnostic only with possible staged PCI.  Renal function has improved & Symptoms have improved.  Performing MD:  Leonie Man, M.D., M.S.  Procedure:  Left & Right Heart Catheterization with Native Coronary & Graft Angiography; Possible Percutaneous Coronary Intervention.  The procedure with Risks/Benefits/Alternatives and Indications was reviewed with the patient.  All questions were answered.    Risks / Complications include, but not limited to: Death, MI, CVA/TIA, VF/VT (with defibrillation), Bradycardia (need for temporary pacer placement), contrast induced nephropathy -- increased risk  with baseline renal dysfunction, bleeding / bruising / hematoma / pseudoaneurysm, vascular or coronary injury (with possible emergent CT or Vascular Surgery), adverse medication reactions, infection.    The patient voice understanding and agree to proceed.   I have signed the consent form and placed it on the chart for patient signature and RN witness.     Leonie Man, M.D., M.S. [facility] [address] [place], [place]  [address]  [phone]  04/02/2012 1:43 PM

## 2012-04-02 NOTE — Clinical Documentation Improvement (Signed)
CHF DOCUMENTATION CLARIFICATION QUERY  THIS DOCUMENT IS NOT A PERMANENT PART OF THE MEDICAL RECORD  Please update your documentation within the medical record to reflect your response to this query.                                                                                    04/02/12  Dear Dr.Hamdi Kley,  In a better effort to capture your patient's severity of illness, reflect appropriate length of stay and utilization of resources, a review of the patient medical record has revealed the following indicators the diagnosis of Heart Failure.   Based on your clinical judgment, please clarify and document in a progress note and/or discharge summary the clinical condition associated with the following supporting information: In responding to this query please exercise your independent judgment.  The fact that a query is asked, does not imply that any particular answer is desired or expected.   This patient has "Acute on Chronic CHF" documented in the progress notes. If possible could you please help provide greater specificity for this patient by determining the type of CHF and documenting it in the progress note and/or discharge summary. [place] YOU!  [facility]: The acuity and type of CHF should always be documented when known [acute, chronic, acute on chronic, systolic, diastolic, systolic and diastolic].  Possible Clinical Conditions?  - Acute on Chronic Systolic Congestive Heart Failure  - Acute on Chronic Diastolic Congestive Heart Failure  - Acute on Chronic Systolic & Diastolic Congestive Heart Failure  - Other condition (please document in the progress notes and/or discharge summary)  - Cannot Clinically determine at this time   Supporting Information:  - 2DEcho 1/21: Study Conclusions: - Left ventricle: The cavity size was normal. Wall thickness was increased in a pattern of mild LVH. Systolic function was severely reduced. The estimated ejection fraction was in the  range of 25% to 30%. There is moderate hypokinesis of the inferolateral, inferior, and inferoseptal myocardium; consistent with ischemia or infarction in the distribution of the right coronary and left circumflex coronary artery. Features are consistent with a pseudonormal left ventricular filling pattern, with concomitant abnormal relaxation and increased filling pressure (grade 2 diastolic dysfunction). Doppler parameters are consistent with elevated mean left atrial filling pressure. - Mitral valve: Mild regurgitation. - Left atrium: The atrium was mildly dilated. - Atrial septum: The septum bowed from left to right, consistent with increased left atrial pressure. No defect or patent foramen ovale was identified. Transthoracic echocardiography. M-mode, complete 2D, spectral Doppler, and color Doppler. Height: Height: 172.7cm. Height: 68in. Weight: Weight: 72.1kg. Weight: 158.6lb. Body mass index: BMI: 24.2kg/m^2. Body surface area: BSA: 1.64m^2. Blood pressure: 160/80. Patient status: Inpatient. Location: Bedside.    Reviewed: additional documentation in the medical record   Thank You,  [place] Documentation Specialist: [phone] Pager  [place]

## 2012-04-03 DIAGNOSIS — N2581 Secondary hyperparathyroidism of renal origin: Secondary | ICD-10-CM | POA: Diagnosis not present

## 2012-04-03 DIAGNOSIS — I120 Hypertensive chronic kidney disease with stage 5 chronic kidney disease or end stage renal disease: Secondary | ICD-10-CM | POA: Diagnosis not present

## 2012-04-03 DIAGNOSIS — I2589 Other forms of chronic ischemic heart disease: Secondary | ICD-10-CM | POA: Diagnosis not present

## 2012-04-03 DIAGNOSIS — I209 Angina pectoris, unspecified: Secondary | ICD-10-CM | POA: Diagnosis not present

## 2012-04-03 DIAGNOSIS — J9600 Acute respiratory failure, unspecified whether with hypoxia or hypercapnia: Secondary | ICD-10-CM

## 2012-04-03 DIAGNOSIS — J449 Chronic obstructive pulmonary disease, unspecified: Secondary | ICD-10-CM | POA: Diagnosis present

## 2012-04-03 DIAGNOSIS — I509 Heart failure, unspecified: Secondary | ICD-10-CM | POA: Diagnosis not present

## 2012-04-03 LAB — CBC
HCT: 34.8 % — ABNORMAL LOW (ref 39.0–52.0)
Platelets: 129 10*3/uL — ABNORMAL LOW (ref 150–400)
RDW: 13.4 % (ref 11.5–15.5)
WBC: 3.5 10*3/uL — ABNORMAL LOW (ref 4.0–10.5)

## 2012-04-03 LAB — GLUCOSE, CAPILLARY: Glucose-Capillary: 184 mg/dL — ABNORMAL HIGH (ref 70–99)

## 2012-04-03 LAB — PTH, INTACT AND CALCIUM: Calcium, Total (PTH): 9.8 mg/dL (ref 8.4–10.5)

## 2012-04-03 LAB — RENAL FUNCTION PANEL
Albumin: 3.4 g/dL — ABNORMAL LOW (ref 3.5–5.2)
Chloride: 105 mEq/L (ref 96–112)
GFR calc Af Amer: 48 mL/min — ABNORMAL LOW (ref >90)
GFR calc non Af Amer: 41 mL/min — ABNORMAL LOW (ref >90)
Phosphorus: 3.2 mg/dL (ref 2.3–4.6)
Potassium: 4.1 mEq/L (ref 3.5–5.1)
Sodium: 141 mEq/L (ref 135–145)

## 2012-04-03 MED ORDER — INSULIN ASPART 100 UNIT/ML ~~LOC~~ SOLN: 0.0000 [IU] | Freq: Three times a day (TID) | SUBCUTANEOUS | Status: DC

## 2012-04-03 MED ORDER — INSULIN ASPART 100 UNIT/ML ~~LOC~~ SOLN: 0.0000 [IU] | Freq: Every day | SUBCUTANEOUS | Status: DC

## 2012-04-03 MED ORDER — CARVEDILOL 6.25 MG PO TABS: 6.2500 mg | ORAL_TABLET | Freq: Two times a day (BID) | ORAL | Status: DC

## 2012-04-03 MED ORDER — FUROSEMIDE 20 MG PO TABS
20.0000 mg | ORAL_TABLET | Freq: Every day | ORAL | Status: DC
  Administered 2012-04-03: 20 mg via ORAL
  Filled 2012-04-03: qty 1

## 2012-04-03 MED ORDER — CARVEDILOL 6.25 MG PO TABS
6.2500 mg | ORAL_TABLET | Freq: Two times a day (BID) | ORAL | Status: DC
  Administered 2012-04-03: 11:00:00 6.25 mg via ORAL
  Filled 2012-04-03 (×3): qty 1

## 2012-04-03 MED ORDER — ATORVASTATIN CALCIUM 40 MG PO TABS: 40.0000 mg | ORAL_TABLET | Freq: Every day | ORAL | Status: DC

## 2012-04-03 MED ORDER — FUROSEMIDE 20 MG PO TABS: 20.0000 mg | ORAL_TABLET | Freq: Every day | ORAL | Status: DC
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
## 2012-04-03 NOTE — Progress Notes (Addendum)
Nutrition Education Note  RD consulted for nutrition education regarding low sodium and HF education.  RD provided "Low Sodium Nutrition Therapy" handout from the Academy of [employer] and Dietetics. Reviewed patient's dietary recall. Provided examples on ways to decrease sodium intake in diet. Discouraged intake of processed foods and use of salt shaker. Encouraged fresh fruits and vegetables as well as whole grain sources of carbohydrates to maximize fiber intake.   RD discussed why it is important for patient to adhere to diet recommendations, and emphasized the role of fluids, foods to avoid, and importance of weighing self daily. Teach back method used.  Expect fair compliance.  Body mass index is 24.07 kg/(m^2). Pt meets criteria for normal weight based on current BMI.  Current diet order is Carb Modified Medium, patient is consuming approximately 50-100% of meals at this time. Labs and medications reviewed. No further nutrition interventions warranted at this time. RD contact information provided. If additional nutrition issues arise, please re-consult RD.   Inda Coke MS, RD, LDN Pager: [phone] After-hours pager: [phone]

## 2012-04-03 NOTE — Progress Notes (Signed)
Nutrition consult completed per MD request for low sodium teaching. CHF video seen by patient. Waiting for care management to complete [facility] for CHF then patient ready for discharge.

## 2012-04-03 NOTE — Progress Notes (Signed)
[facility]  Subjective:  No complaints. No SOB.    Objective: Vital signs in last 24 hours: Blood pressure 139/84, pulse 112, temperature 98.4 F (36.9 C), temperature source Oral, resp. rate 20, height 5\' 8"  (1.727 m), weight 158 lb 4.6 oz (71.8 kg), SpO2 96.00%.    PHYSICAL EXAM Gen: NAD, sleepy but arousable Chest: coarse breath sounds Cardio: regular rate and rhythm, S1, S2 normal, no murmur  GI: soft, non-tender; bowel sounds normal Extremities: no edema.   Neurologic: No focal deficit  Lab Results:   Lab 04/03/12 0640 04/02/12 1734 04/02/12 0439 04/01/12 0352  NA 141 -- 140 142  K 4.1 -- 3.7 3.9  CL 105 -- 105 114*  CO2 23 -- 23 --  BUN 25* -- 27* 31*  CREATININE 1.67* 1.59* 1.66* --  ALB -- -- -- --  GLUCOSE 92 -- -- --  CALCIUM 9.8 -- 9.7 --  PHOS 3.2 -- -- --     Basename 04/03/12 0640 04/02/12 1734  WBC 3.5* 3.2*  HGB 11.9* 13.6  HCT 34.8* 38.9*  PLT 129* 142*     Scheduled Meds:   . amLODipine  10 mg Oral Daily  . atorvastatin  40 mg Oral q1800  . budesonide  0.25 mg Nebulization BID  . carvedilol  3.125 mg Oral BID WC  . clopidogrel  75 mg Oral Daily  . docusate sodium  100 mg Oral BID  . furosemide  40 mg Intravenous Daily  . heparin  5,000 Units Subcutaneous Q8H  . hydrALAZINE  25 mg Oral BID  . insulin aspart  0-15 Units Subcutaneous TID WC  . insulin aspart  0-5 Units Subcutaneous QHS  . levalbuterol  0.63 mg Nebulization BID  . potassium chloride SA  20 mEq Oral BID  . sodium chloride  3 mL Intravenous Q12H  . traZODone  150 mg Oral QHS   Continuous Infusions:   . nitroGLYCERIN     PRN Meds:.sodium chloride, acetaminophen, levalbuterol, morphine injection, ondansetron (ZOFRAN) IV, ondansetron, oxyCODONE, sodium chloride  Assessment/Plan: Pt is a 67 y.o. yo male with a PMHx of HTN, CAD , CABG in 2006, DM2, CKD3, who was admitted to [facility] on 04/01/2012 for evaluation of acute dyspnea and chest pain.  # CKD3 secondary  to arterionephrosclerosis: baseline of 2.0 in office on 12/2011. Admit creatinine of 1.8. Today's creatinine of 1.67 sp cath yesterday.  - patient not significantly anemic at 11.7. No iron studies at this time.  - continue to monitor in the next 24hrs  # Sec HPTH Last PTH 88. Repeat PTH pending.  # Pulmonary edema: likely from cardiac process. Improved with diuresis and nitro drip. Unlikely from CKD since at baseline renal function.  # CAD, s/p CABG. EF: 25- [id number], type 2 diastolic dysf. Placed on nitro drip.  S/p heart cath 04/02/12: showing severe native CAD in the left main stenosis and occluded RCA and circ with moderate to severe LAD obstr. Question as to whether patient is candidate for ICD therapy.  # HTN: currently on home amlodipine 10mg , hydralazine 25mg  bid. Carvedilol restarted as well.  # COPD: on albuterol and budesonide. Per primary team.  # DM2: A1C:6.2. ISS. Per primary team.   Liam Graham, PGY-2  Family Medicine Resident     LOS: 2 days   Liam Graham 04/03/2012,8:42 AM  I have seen and examined this patient and agree with plan per Dr Otis Dials.  Renal fx stable and UO good.  Recheck labs in AM. Espen Bethel  T,MD 04/03/2012 10:58 AM

## 2012-04-03 NOTE — Discharge Summary (Signed)
Physician Discharge Summary  [name] [id number] DOB: [birth year] DOA: 04/01/2012  PCP: No primary provider on file.  Admit date: 04/01/2012 Discharge date: 04/03/2012  Time spent: 45 minutes  Recommendations for Outpatient Follow-up:  1. Will receive HHRN for CHF teaching and monitoring  Discharge Diagnoses:  Principal Problem:  *Acute respiratory failure, requiring BiPap, resolved with diuresis Active Problems:  Pulmonary edema - acute on chronic systolic and diastolic CHF  CORONARY ARTERY DISEASE, CABG 2006, X 3 LIMA-LAD, SVG-LCX OM, VG-PDA CKD 3  COPD  HTN  Chest pain at rest  DIABETES MELLITUS, TYPE II  SUBSTANCE ABUSE, MULTIPLE Secondary HPTH  Discharge Condition: stable  Diet recommendation: heart healthy - 2 gm sodium- low fat diet  Filed Weights   04/01/12 0634 04/01/12 1606 04/03/12 0010  Weight: 72.1 kg (158 lb 15.2 oz) 71.4 kg (157 lb 6.5 oz) 71.8 kg (158 lb 4.6 oz)    History of present illness:  67 y.o. male with hx of polysubstance abuse, including marijuana and cocaine, CHF, HTN, known CAD s/p CABG, DM, presented to the ER with severe shortness of breath. He denied any chest pain, cough, fever or chills. Evaluation in the ER included an EKG which showed LBBB, normal initial troponin, BS of 171, Cr of 1.8, and CXR showed vascular congestion with interstitial edema and cardiomegaly. He was given IV Lasix and placed on Bipap. His initial BP was elevated, but improved to 140/70. He felt better on Bipap.   Hospital Course:  Acute pulmonary edema / acute on chronic systolic and diastolic CHF / Ischemic cardiomyopathy  ECHO 04/01/12 -Left ventricle: The cavity size was normal. Wall thickness was increased in a pattern of mild LVH. Systolic function was severely reduced. The estimated ejection fraction was in the range of 25% to 30%. There is moderate hypokinesis of the inferolateral, inferior, and inferoseptal myocardium; consistent with ischemia or  infarction in the distribution of the right coronary and left circumflex coronary artery. Features are consistent with a pseudonormal left ventricular filling pattern, with concomitant abnormal relaxation and increased filling pressure (grade 2 diastolic dysfunction).   Patient has rapidly improved with diuresis He needs education regarding diet and CHF which he will receive [facility].  An AICD has been discussed with him and he agrees to it.  He will f/u with cardiology next Thursday.   CAD hx of CABG Severe native coronary artery disease involving left main stenosis as well as occluded RCA and Circumflex, with moderate-to-severe LAD disease. 3 of 3 patent grafts, with severe diffuse downstream disease in the distal RCA/RPDA as well as the LAD post grafting. No clear-cut culprit lesion for the patient's resting anginal pain.  CKD stage III  Nephrology was following in the hospital - renal function is stable at this time   HTN  Reasonably controlled at this time - resume home beta blocker and follow   COPD  Well compensated at this time - patient has not smoked tobacco in many years   Polysubstance Abuse (marijuana, cocaine)  Patient advised in no uncertain terms that he must discontinue all drugs of abuse - he admits to actively smoking marijuana but states that he has not used cocaine in over 2 years   DM2  CBGs well-controlled at present time - A1c suggest reasonable control in outpatient setting   Procedures:  Cardiac cath 1/22  Consultations:  Cardiology  nephrology  Discharge Exam: Filed Vitals:   04/03/12 [id number] 04/03/12 [id number] 04/03/12 [id number]  04/03/12 1156  BP:  139/84    Pulse:  112    Temp: 98.1 F (36.7 C)  98.4 F (36.9 C) 97.9 F (36.6 C)  TempSrc: Oral  Oral Oral  Resp:  20    Height:      Weight:      SpO2: 100% 100% 96% 100%    General: alert, no distress Cardiovascular: RRR, no murmur, no pedal edema Respiratory:  CTA b/l   Discharge Instructions      Discharge Orders    Future Orders Please Complete By Expires   Diet - low sodium heart healthy      Comments:   Carb controlled, low fat   Increase activity slowly      Discharge instructions      Comments:   Weigh yourself daily- call you cardiologist if weight increases 3 lbs in 2 days, if you develop shortness of breath or swelling of your ankles.       Medication List     As of 04/03/2012  2:08 PM    TAKE these medications         albuterol 108 (90 BASE) MCG/ACT inhaler   Commonly known as: PROVENTIL HFA;VENTOLIN HFA   Inhale 2 puffs into the lungs every 6 (six) hours as needed. For shortness of breath      amLODipine 10 MG tablet   Commonly known as: NORVASC   Take 10 mg by mouth daily.      atorvastatin 40 MG tablet   Commonly known as: LIPITOR   Take 1 tablet (40 mg total) by mouth daily at 6 PM.      carvedilol 6.25 MG tablet   Commonly known as: COREG   Take 1 tablet (6.25 mg total) by mouth 2 (two) times daily with a meal.      clopidogrel 75 MG tablet   Commonly known as: PLAVIX   Take 75 mg by mouth daily.      furosemide 20 MG tablet   Commonly known as: LASIX   Take 1 tablet (20 mg total) by mouth daily.      hydrALAZINE 25 MG tablet   Commonly known as: APRESOLINE   Take 25 mg by mouth 2 (two) times daily.      oxyCODONE-acetaminophen 10-325 MG per tablet   Commonly known as: PERCOCET   Take 1 tablet by mouth every 8 (eight) hours as needed. For pain      potassium chloride SA 20 MEQ tablet   Commonly known as: K-DUR,KLOR-CON   Take 20 mEq by mouth 2 (two) times daily.      traZODone 150 MG tablet   Commonly known as: DESYREL   Take 150 mg by mouth at bedtime.      Vitamin D (Ergocalciferol) 50000 UNITS Caps   Commonly known as: DRISDOL   Take 50,000 Units by mouth every 7 (seven) days.            The results of significant diagnostics from this hospitalization (including imaging,  microbiology, ancillary and laboratory) are listed below for reference.    Significant Diagnostic Studies: Dg Chest Portable 1 View  04/01/2012  *RADIOLOGY REPORT*  Clinical Data: Shortness of breath and chest pain.  PORTABLE CHEST - 1 VIEW  Comparison: Chest radiograph performed 07/26/2011  Findings: The lungs are well-aerated.  Vascular congestion is noted, with mildly increased interstitial markings, suggestive of mild interstitial edema.  There is no evidence of pleural effusion or pneumothorax.  The cardiomediastinal silhouette is  mildly enlarged; the patient is status post median sternotomy, with evidence of prior CABG.  No acute osseous abnormalities are seen.  IMPRESSION: Vascular congestion and mild cardiomegaly, with mildly increased interstitial markings, suggestive of mild interstitial edema.   Original Report Authenticated By: Santa Lighter, M.D.     Microbiology: Recent Results (from the past 240 hour(s))  MRSA PCR SCREENING     Status: Normal   Collection Time   04/01/12  6:29 AM      Component Value Range Status Comment   MRSA by PCR NEGATIVE  NEGATIVE Final      Labs: Basic Metabolic Panel:  Lab [id number] 04/02/12 1734 04/02/12 1146 04/02/12 0439 04/01/12 0352  NA 141 -- -- 140 142  K 4.1 -- -- 3.7 3.9  CL 105 -- -- 105 114*  CO2 23 -- -- 23 --  GLUCOSE 92 -- -- 104* 171*  BUN 25* -- -- 27* 31*  CREATININE 1.67* 1.59* -- 1.66* 1.80*  CALCIUM 9.8 -- 9.8 9.7 --  MG -- -- -- -- --  PHOS 3.2 -- -- -- --   Liver Function Tests:  Lab 04/03/12 0640 04/01/12 1359  AST -- 19  ALT -- 21  ALKPHOS -- 171*  BILITOT -- 0.6  PROT -- 7.2  ALBUMIN 3.4* 3.6   No results found for this basename: LIPASE:5,AMYLASE:5 in the last 168 hours No results found for this basename: AMMONIA:5 in the last 168 hours CBC:  Lab 04/03/12 0640 04/02/12 1734 04/02/12 0439 04/01/12 0352 04/01/12 0350  WBC 3.5* 3.2* 3.2* -- 4.2  NEUTROABS -- -- -- -- --  HGB 11.9* 13.6 11.7* 12.9* 12.3*    HCT 34.8* 38.9* 34.3* 38.0* 34.9*  MCV 89.5 89.4 89.3 -- 90.6  PLT 129* 142* 125* -- 131*   Cardiac Enzymes:  Lab 04/01/12 1838 04/01/12 1359 04/01/12 0645  CKTOTAL -- -- --  CKMB -- -- --  CKMBINDEX -- -- --  TROPONINI <0.30 <0.30 <0.30   BNP: BNP (last 3 results)  Basename 04/02/12 0439 04/01/12 0350  PROBNP 4151.0* 3094.0*   CBG:  Lab 04/03/12 1158 04/03/12 0747 04/02/12 2159 04/02/12 1728 04/02/12 1553  GLUCAP 184* 95 87 104* 84       Signed:  [place]ia  [facility] 04/03/2012, 2:08 PM

## 2012-04-03 NOTE — Progress Notes (Signed)
04/03/12 @1530 .Marland KitchenMarland KitchenSpoke with patient concerning Conrad needs.  Pt requested to have RN, rolling walker, and a weight scale.  HHRN set up through [facility]; pt not eligible to rolling walker at this time due to having recieved a cane in 2009; provided a scale through [facility] program.   Fuller Mandril, RN, BSN, [place]  [phone].

## 2012-04-06 DIAGNOSIS — I2510 Atherosclerotic heart disease of native coronary artery without angina pectoris: Secondary | ICD-10-CM | POA: Diagnosis not present

## 2012-04-06 DIAGNOSIS — I10 Essential (primary) hypertension: Secondary | ICD-10-CM | POA: Diagnosis not present

## 2012-04-06 DIAGNOSIS — I509 Heart failure, unspecified: Secondary | ICD-10-CM | POA: Diagnosis not present

## 2012-04-06 DIAGNOSIS — E119 Type 2 diabetes mellitus without complications: Secondary | ICD-10-CM | POA: Diagnosis not present

## 2012-04-06 DIAGNOSIS — I5043 Acute on chronic combined systolic (congestive) and diastolic (congestive) heart failure: Secondary | ICD-10-CM | POA: Diagnosis not present

## 2012-04-06 DIAGNOSIS — I2699 Other pulmonary embolism without acute cor pulmonale: Secondary | ICD-10-CM | POA: Diagnosis not present

## 2012-04-08 DIAGNOSIS — I2699 Other pulmonary embolism without acute cor pulmonale: Secondary | ICD-10-CM | POA: Diagnosis not present

## 2012-04-08 DIAGNOSIS — I10 Essential (primary) hypertension: Secondary | ICD-10-CM | POA: Diagnosis not present

## 2012-04-08 DIAGNOSIS — I5043 Acute on chronic combined systolic (congestive) and diastolic (congestive) heart failure: Secondary | ICD-10-CM | POA: Diagnosis not present

## 2012-04-08 DIAGNOSIS — I509 Heart failure, unspecified: Secondary | ICD-10-CM | POA: Diagnosis not present

## 2012-04-08 DIAGNOSIS — I2510 Atherosclerotic heart disease of native coronary artery without angina pectoris: Secondary | ICD-10-CM | POA: Diagnosis not present

## 2012-04-08 DIAGNOSIS — E119 Type 2 diabetes mellitus without complications: Secondary | ICD-10-CM | POA: Diagnosis not present

## 2012-04-12 HISTORY — PX: CARDIAC DEFIBRILLATOR PLACEMENT: SHX171

## 2012-04-14 ENCOUNTER — Other Ambulatory Visit: Payer: Self-pay | Admitting: *Deleted

## 2012-04-15 DIAGNOSIS — E119 Type 2 diabetes mellitus without complications: Secondary | ICD-10-CM | POA: Diagnosis not present

## 2012-04-15 DIAGNOSIS — I10 Essential (primary) hypertension: Secondary | ICD-10-CM | POA: Diagnosis not present

## 2012-04-15 DIAGNOSIS — I2510 Atherosclerotic heart disease of native coronary artery without angina pectoris: Secondary | ICD-10-CM | POA: Diagnosis not present

## 2012-04-15 DIAGNOSIS — I509 Heart failure, unspecified: Secondary | ICD-10-CM | POA: Diagnosis not present

## 2012-04-15 DIAGNOSIS — I5043 Acute on chronic combined systolic (congestive) and diastolic (congestive) heart failure: Secondary | ICD-10-CM | POA: Diagnosis not present

## 2012-04-15 DIAGNOSIS — I2699 Other pulmonary embolism without acute cor pulmonale: Secondary | ICD-10-CM | POA: Diagnosis not present

## 2012-04-17 DIAGNOSIS — I2589 Other forms of chronic ischemic heart disease: Secondary | ICD-10-CM | POA: Diagnosis not present

## 2012-04-17 DIAGNOSIS — E119 Type 2 diabetes mellitus without complications: Secondary | ICD-10-CM | POA: Diagnosis not present

## 2012-04-17 DIAGNOSIS — I2510 Atherosclerotic heart disease of native coronary artery without angina pectoris: Secondary | ICD-10-CM | POA: Diagnosis not present

## 2012-04-17 DIAGNOSIS — I509 Heart failure, unspecified: Secondary | ICD-10-CM | POA: Diagnosis not present

## 2012-04-17 MED ORDER — SODIUM CHLORIDE 0.9 % IR SOLN
80.0000 mg | Status: DC
  Filled 2012-04-17: qty 2

## 2012-04-17 MED ORDER — SODIUM CHLORIDE 0.45 % IV SOLN
INTRAVENOUS | Status: DC
  Administered 2012-04-18: 1000 mL via INTRAVENOUS

## 2012-04-17 MED ORDER — CEFAZOLIN SODIUM-DEXTROSE 2-3 GM-% IV SOLR
2.0000 g | INTRAVENOUS | Status: DC
  Filled 2012-04-17 (×2): qty 50

## 2012-04-17 MED ORDER — SODIUM CHLORIDE 0.9 % IJ SOLN: 3.0000 mL | INTRAMUSCULAR | Status: DC | PRN

## 2012-04-18 ENCOUNTER — Encounter (HOSPITAL_COMMUNITY): Payer: Self-pay | Admitting: Cardiology

## 2012-04-18 ENCOUNTER — Ambulatory Visit (HOSPITAL_COMMUNITY)
Admission: RE | Admit: 2012-04-18 | Discharge: 2012-04-19 | Disposition: A | Discharge status: Home / Self Care | Payer: Medicare Other | Source: Ambulatory Visit | Attending: Cardiovascular Disease | Admitting: Cardiovascular Disease

## 2012-04-18 ENCOUNTER — Encounter (HOSPITAL_COMMUNITY)
Admission: RE | Disposition: A | Discharge status: Home / Self Care | Payer: Self-pay | Source: Ambulatory Visit | Attending: Cardiovascular Disease

## 2012-04-18 DIAGNOSIS — I509 Heart failure, unspecified: Secondary | ICD-10-CM | POA: Diagnosis not present

## 2012-04-18 DIAGNOSIS — Z95810 Presence of automatic (implantable) cardiac defibrillator: Secondary | ICD-10-CM | POA: Diagnosis not present

## 2012-04-18 DIAGNOSIS — I2510 Atherosclerotic heart disease of native coronary artery without angina pectoris: Secondary | ICD-10-CM | POA: Diagnosis present

## 2012-04-18 DIAGNOSIS — I255 Ischemic cardiomyopathy: Secondary | ICD-10-CM

## 2012-04-18 DIAGNOSIS — E089 Diabetes mellitus due to underlying condition without complications: Secondary | ICD-10-CM | POA: Diagnosis present

## 2012-04-18 DIAGNOSIS — I2589 Other forms of chronic ischemic heart disease: Secondary | ICD-10-CM | POA: Insufficient documentation

## 2012-04-18 DIAGNOSIS — E119 Type 2 diabetes mellitus without complications: Secondary | ICD-10-CM | POA: Insufficient documentation

## 2012-04-18 DIAGNOSIS — Z9189 Other specified personal risk factors, not elsewhere classified: Secondary | ICD-10-CM

## 2012-04-18 HISTORY — DX: Ischemic cardiomyopathy: I25.5

## 2012-04-18 HISTORY — DX: Cardiac arrhythmia, unspecified: I49.9

## 2012-04-18 HISTORY — DX: Atherosclerotic heart disease of native coronary artery without angina pectoris: I25.10

## 2012-04-18 HISTORY — DX: Other specified personal risk factors, not elsewhere classified: Z91.89

## 2012-04-18 HISTORY — DX: Acute myocardial infarction, unspecified: I21.9

## 2012-04-18 HISTORY — PX: IMPLANTABLE CARDIOVERTER DEFIBRILLATOR IMPLANT: SHX5473

## 2012-04-18 HISTORY — DX: Shortness of breath: R06.02

## 2012-04-18 HISTORY — DX: Chronic kidney disease, unspecified: N18.9

## 2012-04-18 LAB — PROTIME-INR: Prothrombin Time: 14.4 seconds (ref 11.6–15.2)

## 2012-04-18 LAB — GLUCOSE, CAPILLARY: Glucose-Capillary: 95 mg/dL (ref 70–99)

## 2012-04-18 LAB — COMPREHENSIVE METABOLIC PANEL
ALT: 13 U/L (ref 0–53)
Alkaline Phosphatase: 155 U/L — ABNORMAL HIGH (ref 39–117)
BUN: 36 mg/dL — ABNORMAL HIGH (ref 6–23)
CO2: 22 mEq/L (ref 19–32)
Chloride: 104 mEq/L (ref 96–112)
GFR calc Af Amer: 37 mL/min — ABNORMAL LOW (ref >90)
GFR calc non Af Amer: 32 mL/min — ABNORMAL LOW (ref >90)
Glucose, Bld: 106 mg/dL — ABNORMAL HIGH (ref 70–99)
Potassium: 4.1 mEq/L (ref 3.5–5.1)
Total Bilirubin: 0.3 mg/dL (ref 0.3–1.2)

## 2012-04-18 LAB — CBC
HCT: 31.5 % — ABNORMAL LOW (ref 39.0–52.0)
Hemoglobin: 10.5 g/dL — ABNORMAL LOW (ref 13.0–17.0)
MCHC: 33.3 g/dL (ref 30.0–36.0)
RBC: 3.47 MIL/uL — ABNORMAL LOW (ref 4.22–5.81)
WBC: 3.1 10*3/uL — ABNORMAL LOW (ref 4.0–10.5)

## 2012-04-18 LAB — MRSA PCR SCREENING: MRSA by PCR: NEGATIVE

## 2012-04-18 LAB — APTT: aPTT: 35 seconds (ref 24–37)

## 2012-04-18 SURGERY — IMPLANTABLE CARDIOVERTER DEFIBRILLATOR IMPLANT
Anesthesia: LOCAL

## 2012-04-18 MED ORDER — ALBUTEROL SULFATE HFA 108 (90 BASE) MCG/ACT IN AERS: 2.0000 | INHALATION_SPRAY | Freq: Four times a day (QID) | RESPIRATORY_TRACT | Status: DC | PRN

## 2012-04-18 MED ORDER — MUPIROCIN 2 % EX OINT
TOPICAL_OINTMENT | Freq: Two times a day (BID) | CUTANEOUS | Status: DC
  Administered 2012-04-18: 1 via NASAL

## 2012-04-18 MED ORDER — HYDROCODONE-ACETAMINOPHEN 5-325 MG PO TABS
1.0000 | ORAL_TABLET | ORAL | Status: DC | PRN
  Administered 2012-04-18: 2 via ORAL
  Filled 2012-04-18 (×2): qty 2

## 2012-04-18 MED ORDER — FENTANYL CITRATE 0.05 MG/ML IJ SOLN
INTRAMUSCULAR | Status: AC
  Filled 2012-04-18: qty 2

## 2012-04-18 MED ORDER — HYDRALAZINE HCL 25 MG PO TABS
25.0000 mg | ORAL_TABLET | Freq: Two times a day (BID) | ORAL | Status: DC
  Administered 2012-04-18 – 2012-04-19 (×2): 25 mg via ORAL
  Filled 2012-04-18 (×4): qty 1

## 2012-04-18 MED ORDER — MIDAZOLAM HCL 2 MG/2ML IJ SOLN
INTRAMUSCULAR | Status: AC
  Filled 2012-04-18: qty 2

## 2012-04-18 MED ORDER — SODIUM CHLORIDE 0.45 % IV SOLN: INTRAVENOUS | Status: AC

## 2012-04-18 MED ORDER — ATORVASTATIN CALCIUM 40 MG PO TABS
40.0000 mg | ORAL_TABLET | Freq: Every day | ORAL | Status: DC
  Administered 2012-04-18: 40 mg via ORAL
  Filled 2012-04-18 (×2): qty 1

## 2012-04-18 MED ORDER — FUROSEMIDE 20 MG PO TABS
20.0000 mg | ORAL_TABLET | Freq: Every day | ORAL | Status: DC
  Administered 2012-04-19: 20 mg via ORAL
  Filled 2012-04-18 (×2): qty 1

## 2012-04-18 MED ORDER — INSULIN ASPART 100 UNIT/ML ~~LOC~~ SOLN: 0.0000 [IU] | Freq: Three times a day (TID) | SUBCUTANEOUS | Status: DC

## 2012-04-18 MED ORDER — AMLODIPINE BESYLATE 10 MG PO TABS
10.0000 mg | ORAL_TABLET | Freq: Every day | ORAL | Status: DC
  Administered 2012-04-19: 10 mg via ORAL
  Filled 2012-04-18 (×2): qty 1

## 2012-04-18 MED ORDER — OXYCODONE-ACETAMINOPHEN 10-325 MG PO TABS: 1.0000 | ORAL_TABLET | Freq: Three times a day (TID) | ORAL | Status: DC | PRN

## 2012-04-18 MED ORDER — OXYCODONE-ACETAMINOPHEN 5-325 MG PO TABS
1.0000 | ORAL_TABLET | Freq: Three times a day (TID) | ORAL | Status: DC | PRN
  Administered 2012-04-18 – 2012-04-19 (×2): 1 via ORAL
  Filled 2012-04-18 (×2): qty 1

## 2012-04-18 MED ORDER — YOU HAVE A PACEMAKER BOOK
Freq: Once | Status: AC
  Administered 2012-04-19: 07:00:00
  Filled 2012-04-18: qty 1

## 2012-04-18 MED ORDER — CEFAZOLIN SODIUM-DEXTROSE 2-3 GM-% IV SOLR
INTRAVENOUS | Status: AC
  Filled 2012-04-18: qty 50

## 2012-04-18 MED ORDER — CLOPIDOGREL BISULFATE 75 MG PO TABS
75.0000 mg | ORAL_TABLET | Freq: Every day | ORAL | Status: DC
  Administered 2012-04-19: 09:00:00 75 mg via ORAL
  Filled 2012-04-18: qty 1

## 2012-04-18 MED ORDER — HEPARIN (PORCINE) IN NACL 2-0.9 UNIT/ML-% IJ SOLN
INTRAMUSCULAR | Status: AC
  Filled 2012-04-18: qty 500

## 2012-04-18 MED ORDER — ACETAMINOPHEN 325 MG PO TABS: 325.0000 mg | ORAL_TABLET | ORAL | Status: DC | PRN

## 2012-04-18 MED ORDER — POTASSIUM CHLORIDE CRYS ER 20 MEQ PO TBCR
20.0000 meq | EXTENDED_RELEASE_TABLET | Freq: Two times a day (BID) | ORAL | Status: DC
  Administered 2012-04-18 – 2012-04-19 (×2): 20 meq via ORAL
  Filled 2012-04-18 (×3): qty 1

## 2012-04-18 MED ORDER — TRAZODONE HCL 150 MG PO TABS
150.0000 mg | ORAL_TABLET | Freq: Every day | ORAL | Status: DC
  Administered 2012-04-18: 150 mg via ORAL
  Filled 2012-04-18 (×2): qty 1

## 2012-04-18 MED ORDER — ONDANSETRON HCL 4 MG/2ML IJ SOLN: 4.0000 mg | Freq: Four times a day (QID) | INTRAMUSCULAR | Status: DC | PRN

## 2012-04-18 MED ORDER — LIDOCAINE HCL (PF) 1 % IJ SOLN
INTRAMUSCULAR | Status: AC
  Filled 2012-04-18: qty 60

## 2012-04-18 MED ORDER — CHLORHEXIDINE GLUCONATE 4 % EX LIQD: 60.0000 mL | Freq: Once | CUTANEOUS | Status: DC

## 2012-04-18 MED ORDER — CEFAZOLIN SODIUM 1-5 GM-% IV SOLN
1.0000 g | Freq: Four times a day (QID) | INTRAVENOUS | Status: AC
  Administered 2012-04-18 – 2012-04-19 (×3): 1 g via INTRAVENOUS
  Filled 2012-04-18 (×3): qty 50

## 2012-04-18 MED ORDER — OXYCODONE HCL 5 MG PO TABS
5.0000 mg | ORAL_TABLET | Freq: Three times a day (TID) | ORAL | Status: DC | PRN
  Administered 2012-04-18 – 2012-04-19 (×2): 5 mg via ORAL
  Filled 2012-04-18 (×2): qty 1

## 2012-04-18 MED ORDER — CARVEDILOL 6.25 MG PO TABS
6.2500 mg | ORAL_TABLET | Freq: Two times a day (BID) | ORAL | Status: DC
  Administered 2012-04-18 – 2012-04-19 (×2): 6.25 mg via ORAL
  Filled 2012-04-18 (×4): qty 1

## 2012-04-18 MED ORDER — MUPIROCIN 2 % EX OINT
TOPICAL_OINTMENT | CUTANEOUS | Status: AC
  Administered 2012-04-18: 1 via NASAL
  Filled 2012-04-18: qty 22

## 2012-04-18 NOTE — H&P (Signed)
Date of Initial H&P: 04/01/2012 and Consult note same date  History reviewed, patient examined, no change in status, stable for surgery. Primary prevention ICD implantation for ischemic cardiomyopathy (Prior myocardial infarction, left ventricular ejection fraction under 35%, heart failure NYHA class II-III, on comprehensive medical therapy). This procedure has been fully reviewed with the patient and written informed consent has been obtained. Note mild acute worsening of chronic renal insufficiency, possibly residual contrast nephrotoxicity from coronary angio 04/01/12.  Sanda Klein, MD, [place] [phone] office [phone] pager

## 2012-04-18 NOTE — CV Procedure (Signed)
[place] Male, 67 y.o., 11-01-1945  MRN: [id number]  CSN: [id number]  Admit Dt: 04/18/12   Procedure: 1. New Dual Chamber Implantable Aeronautical engineer 2. Fluoroscopy 3. Moderate sedation. 4. Initial lead and generator testing/defibrillation threshold testing  Indications: Primary prevention ICD implantation for ischemic cardiomyopathy (Prior myocardial infarction, left ventricular ejection fraction under 35%, heart failure NYHA class II-III, on comprehensive medical therapy, >9 months)  Medications: Ancef 2g IV; Lidocaine 1% 30 mL locally; Versed 4 mg IV; Fentanyl 100 mcg IV Procedure report  Procedure performed by: Sanda Klein, MD  Complications: None  Estimated blood loss: <10 mL  Device details: Generator Medtronic [place] model [id number] serial number [id number] Right atrial lead Medtronic [id number] serial number [id number]  Right ventricular lead Medtronic [id number] serial number [id number]  Procedure details:  After the risks and benefits of the procedure were discussed the patient provided informed consent and was brought to the cardiac cath lab in the fasting state. The patient was prepped and draped in usual sterile fashion. Local anesthesia with 1% lidocaine was administered to to the left infraclavicular area. A 5-6 cm horizontal incision was made parallel with and 2-3 cm caudal to the left clavicle. Using electrocautery and blunt dissection a prepectoral pocket was created down to the level of the pectoralis major muscle fascia. The pocket was carefully inspected for hemostasis. An antibiotic-soaked sponge was placed in the pocket.  Under fluoroscopic guidance and using the modified Seldinger technique 2 separate venipunctures were performed to access the left subclavian vein. No difficulty was encountered accessing the vein.  Two J-tip guidewires were subsequently exchanged for 62F and 8.31F French safe sheaths, respectively.  Under fluoroscopic  guidance the ventricular lead was advanced to level of the mid to apical right ventricular septum and thet active-fixation helix was deployed. Prominent current of injury was seen. Satisfactory pacing and sensing parameters were recorded. There was no evidence of diaphragmatic stimulation at maximum device output. The safe sheath was peeled away and the lead was secured in place with 2-0 silk.  In similar fashion the right atrial lead was advanced to the level of the atrial appendage. The active-fixation helix was deployed. There was prominent current of injury. Satisfactory  pacing and sensing parameters were recorded. There was no evidence of diaphragmatic stimulation with pacing at maximum device output. The safe sheath was peeled away and the lead was secured in place with 2-0 silk.  The antibiotic-soaked sponge was removed from the pocket. The pocket was flushed with copious amounts of antibiotic solution. Reinspection showed excellent hemostasis.  The ventricular lead was connected to the generator and appropriate ventricular pacing was seen. Subsequently the atrial lead was also connected. Repeat testing of the lead parameters later showed excellent values.  The entire system was then carefully inserted in the pocket with care been taking that the leads and device assumed a comfortable position without pressure on the incision. Great care was taken that the leads be located deep to the generator. The pocket was then closed in layers using 2 layers of 2-0 Vicryl and cutaneous staples, after which a sterile dressing was applied.  Defibrillation threshold testing was then performed. After adequate sedation was achieved, ventricular fibrillation was induced with a 1J shock on T method. There was appropriate sensing by the device. There was only one dropout during "least sensitive" settings. The arrhythmia was terminated by a single 15J shock. High voltage impedance during the shock was 54 ohm. Retesting  of the leads confirmed normal function.  At  the end of the procedure the following lead parameters were encountered:  Right atrial lead  sensed P waves 3.2, impedance 614ohms, threshold 1.2 V at 0.5 ms pulse width.  Right ventricular lead sensed R waves 11.2 mV, impedance 739 ohms, threshold 0.7 V at 0.5 ms pulse width.  High voltage impedance 54, charge time 3.4s.  Sanda Klein, MD, [place] [phone] office [phone] pager 04/18/2012 10:31 AM   Cc:

## 2012-04-18 NOTE — Discharge Summary (Signed)
Physician Discharge Summary  Patient ID: [name] MRN: [id number] DOB/AGE: 10-31-1945 67 y.o.  Admit date: 04/18/2012 Discharge date: 04/18/2012  Discharge Diagnoses:  Principal Problem:  *S/P ICD (internal cardiac defibrillator) procedure, 04/18/12, Medtronic device ro ICM Active Problems:  At risk for sudden cardiac death  Cardiomyopathy, ischemic  DIABETES MELLITUS, TYPE II  CORONARY ARTERY DISEASE, CABG 2006, X 3 LIMA-LAD, SVG-LCX OM, VG-PDA   Discharged Condition: good  Procedures: 04/18/2012  Pacemaker placed by Dr. Sallyanne Kuster Generator Medtronic [place] model [id number] serial number [id number]  Right atrial lead Medtronic [id number] serial number [id number]  Right ventricular lead Medtronic [id number] serial number [id number] [place] y.o. male withhx of polysubstance abuse, including THC and cocaine, CHF, HTN, known CAD s/p CABG 2006 X 3 (for severe Left Main disease, occluded distal RCA, and occluded circumflex and a previously placed 3.0 x 18 motor Cypher DES stent.), and ICM with EF 35% 2011.  He was cathed 04/02/12 secondary to decreasing EF now 25%.  No clear cut culprit lesion was seen for ischemia though he did have  Severe native coronary artery disease involving left main stenosis as well as occluded RCA and Circumflex, with moderate-to-severe LAD disease.  3 of 3 patent grafts, with severe diffuse downstream disease in the distal RCA/RPDA as well as the LAD post grafting.  Notable discrepancy between Fick and thermodilution cardiac output.  Was well diuresis/compensated cardiomyopathy LVEDP of 7 mmHg but with a wedge pressure of roughly 20 mmHg suggesting some complement of mitral regurgitation.   At that time Dr. Sallyanne Kuster saw the patient and discussed AICD and pt was willing to proceed.  He meets criteria for primary prevention ICD implantation for ischemic cardiomyopathy (Prior myocardial infarction, left ventricular ejection fraction under 35%, heart failure NYHA class  II-III, on comprehensive medical therapy, over 9 months since onset, no history of AF).  Pt presented 04/18/12 for AICD.  It was placed by Dr. Sallyanne Kuster without complications.  Cr. Elevated  Day of procedure byt at discharge returned to 1.6.  The AM of discharge site with minimal old blood at site, telfa dressing changed.  + tenderness at site.  CRX without complications.  Pt ambulated and was ready for discharge.  Reviewed incision site instructions and sling instructions. Pt was seen and evaluated by Dr. Ellyn Hack and discharged home.   Interrogaton at discharge of ICD.  Pacer Impedence: Atrial: 361 ohms    RV 475 ohms  defib impedence:   RV= 45 ohms  Capture threshold: Atrial 0.375 V @ 0.40 ms    Vent 0.375V @ 0.40 ms Day of d/c 04/19/12    Consults: None  Significant Diagnostic Studies:  BMET    Component Value Date/Time   NA 137 04/19/2012 0700   K 4.1 04/19/2012 0700   CL 106 04/19/2012 0700   CO2 21 04/19/2012 0700   GLUCOSE 103* 04/19/2012 0700   BUN 25* 04/19/2012 0700   CREATININE 1.61* 04/19/2012 0700   CALCIUM 9.4 04/19/2012 0700   CALCIUM 9.8 04/02/2012 1146   GFRNONAA 43* 04/19/2012 0700   GFRAA 50* 04/19/2012 0700       Discharge Exam: Blood pressure 143/79, pulse 69, temperature 97.6 F (36.4 C), temperature source Oral, resp. rate 17, height 5\' 8"  (1.727 m), weight 71.668 kg (158 lb), SpO2 98.00%.   Exam at discharge:General appearance: alert, cooperative, appears stated age and no distress  Neck: no adenopathy, no carotid bruit and no JVD  Lungs: clear to auscultation bilaterally and normal percussion bilaterally  Heart: regular rate and rhythm, S1, S2 normal, no murmur, click, rub or gallop  Abdomen: soft, non-tender; bowel sounds normal; no masses, no organomegaly  Extremities: extremities normal, atraumatic, no cyanosis or edema and The ICD insertion site hs a pressure dressing in place. There is no evidence of swelling or puffiness or erythema. There is dried sanguinous drainage  along the suture line but otherwise the wound is clean. The pressure dressing will be removed prior to discharge. The Tegaderm dressing will remove tomorrow.  Pulses: 2+ and symmetric  Neurologic: Grossly normal     Disposition: 06-Home-Health Care Svc     Medication List     As of 04/18/2012  5:25 PM    ASK your doctor about these medications         albuterol 108 (90 BASE) MCG/ACT inhaler   Commonly known as: PROVENTIL HFA;VENTOLIN HFA   Inhale 2 puffs into the lungs every 6 (six) hours as needed. For shortness of breath      amLODipine 10 MG tablet   Commonly known as: NORVASC   Take 10 mg by mouth daily.      atorvastatin 40 MG tablet   Commonly known as: LIPITOR   Take 1 tablet (40 mg total) by mouth daily at 6 PM.      carvedilol 6.25 MG tablet   Commonly known as: COREG   Take 1 tablet (6.25 mg total) by mouth 2 (two) times daily with a meal.      clopidogrel 75 MG tablet   Commonly known as: PLAVIX   Take 75 mg by mouth daily.      furosemide 20 MG tablet   Commonly known as: LASIX   Take 1 tablet (20 mg total) by mouth daily.      hydrALAZINE 25 MG tablet   Commonly known as: APRESOLINE   Take 25 mg by mouth 2 (two) times daily.      oxyCODONE-acetaminophen 10-325 MG per tablet   Commonly known as: PERCOCET   Take 1 tablet by mouth every 8 (eight) hours as needed. For pain      potassium chloride SA 20 MEQ tablet   Commonly known as: K-DUR,KLOR-CON   Take 20 mEq by mouth 2 (two) times daily.      traZODone 150 MG tablet   Commonly known as: DESYREL   Take 150 mg by mouth at bedtime.      Vitamin D (Ergocalciferol) 50000 UNITS Caps   Commonly known as: DRISDOL   Take 50,000 Units by mouth every 7 (seven) days.           Follow-up Information    Follow up with [facility] R, NP. On 04/29/2012. (at 10:20 am  for ICD site check)    Contact information:   [address] [place] [address] [phone]        Discharge  Instructions: Post AICD instructions.  Call if 3 pound wt gain in 24 hours or 5 pounds in 1 week.  Low salt diet.  SignedIsaiah Serge 04/18/2012, 5:25 PM

## 2012-04-19 ENCOUNTER — Ambulatory Visit (HOSPITAL_COMMUNITY): Payer: Medicare Other

## 2012-04-19 DIAGNOSIS — Z95810 Presence of automatic (implantable) cardiac defibrillator: Secondary | ICD-10-CM | POA: Diagnosis not present

## 2012-04-19 DIAGNOSIS — I2510 Atherosclerotic heart disease of native coronary artery without angina pectoris: Secondary | ICD-10-CM | POA: Diagnosis not present

## 2012-04-19 DIAGNOSIS — E119 Type 2 diabetes mellitus without complications: Secondary | ICD-10-CM | POA: Diagnosis not present

## 2012-04-19 DIAGNOSIS — R0989 Other specified symptoms and signs involving the circulatory and respiratory systems: Secondary | ICD-10-CM | POA: Diagnosis not present

## 2012-04-19 DIAGNOSIS — I509 Heart failure, unspecified: Secondary | ICD-10-CM | POA: Diagnosis not present

## 2012-04-19 DIAGNOSIS — I2589 Other forms of chronic ischemic heart disease: Secondary | ICD-10-CM | POA: Diagnosis not present

## 2012-04-19 LAB — BASIC METABOLIC PANEL
CO2: 21 mEq/L (ref 19–32)
Calcium: 9.4 mg/dL (ref 8.4–10.5)
Chloride: 106 mEq/L (ref 96–112)
Glucose, Bld: 103 mg/dL — ABNORMAL HIGH (ref 70–99)
Sodium: 137 mEq/L (ref 135–145)

## 2012-04-19 MED ORDER — HYDROCODONE-ACETAMINOPHEN 5-325 MG PO TABS: 1.0000 | ORAL_TABLET | ORAL | Status: DC | PRN

## 2012-04-19 NOTE — Progress Notes (Signed)
I have seen and evaluated the patient this AM along with Cecilie Kicks, NP. I agree with her findings, examination as well as impression recommendations.  General appearance: alert, cooperative, appears stated age and no distress Neck: no adenopathy, no carotid bruit and no JVD Lungs: clear to auscultation bilaterally and normal percussion bilaterally Heart: regular rate and rhythm, S1, S2 normal, no murmur, click, rub or gallop Abdomen: soft, non-tender; bowel sounds normal; no masses,  no organomegaly Extremities: extremities normal, atraumatic, no cyanosis or edema and The ICD insertion site hs a pressure dressing in place. There is no evidence of swelling or puffiness or erythema. There is dried sanguinous drainage along the suture line but otherwise the wound is clean. The pressure dressing will be removed prior to discharge. The Tegaderm dressing will remove tomorrow. Pulses: 2+ and symmetric Neurologic: Grossly normal  The patient looks and feels well as morning. He has appropriate soreness at the insertion site, however the dressing is intact with small amount of serosanguineous drainage that is now dried on the dressing.  The device was interrogated, and appears to be working correctly. Unfortunately chest x-ray has not yet been done which these be done prior to discharge. He is otherwise stable for discharge pending the outcome of the chest x-ray.  Will provide him short supply of Norco for pain relief upon discharge. He'll be seen this week for wound check and staple removal. He will then followup roughly one month after that with Dr. Sallyanne Kuster at [facility] and [place].  Leonie Man, M.D., M.S. [facility] [address] [place], [place]  [address]  [phone] Pager # [phone] 04/19/2012 9:15 AM

## 2012-04-19 NOTE — Plan of Care (Signed)
Problem: Consults Goal: ICD Patient Education (See Patient Education module for education specifics.)  Outcome: Progressing Given "You have a pacemaker and/or ICD" book.  Pt states he already watched the video.  Discussed CHF principles, given CHF packet, states already weighs self daily.  Discussed low sodium diet and when to call MD.  Pt states will watch CHF video when he gets up this am. No questions voiced.

## 2012-04-19 NOTE — Plan of Care (Signed)
Problem: Consults Goal: Tobacco Cessation referral if indicated Outcome: Completed/Met Date Met:  04/19/12 Discussed smoking cessation.  States smokes marijuana every day but realizes need to quit.  States has gone 2 years without before and will have no problem.  Given cessation tips handout with 1800 quit #.

## 2012-04-19 NOTE — Progress Notes (Signed)
Primary prevention ICD implantation for ischemic cardiomyopathy (Prior myocardial infarction, left ventricular ejection fraction under 35%, heart failure NYHA class II-III, on comprehensive medical therapy, >9 months)     Subjective: No complaints  Objective: Vital signs in last 24 hours: Temp:  [98 F (36.7 C)-98.5 F (36.9 C)] 98.5 F (36.9 C) (02/08 0756) Pulse Rate:  [60-77] 63 (02/08 0756) Resp:  [16-42] 42 (02/08 0756) BP: (133-166)/(70-95) 150/70 mmHg (02/08 0756) SpO2:  [95 %-99 %] 96 % (02/08 0756) Weight:  [75.1 kg (165 lb 9.1 oz)] 75.1 kg (165 lb 9.1 oz) (02/08 0500) Weight change:  Last BM Date: 04/18/12 Intake/Output from previous day: 02/07 0701 - 02/08 0700 In: 1190 [P.O.:440; I.V.:600; IV Piggyback:150] Out: 1500 [Urine:1500] Intake/Output this shift:    PE: exam per Dr. Abagail Kitchens Impedence:            Atrial: 361 ohms                  RV 475 ohms     defib impedence:   RV= 45 ohms  Capture threshold:  Atrial   0.375 V @ 0.40 ms       Vent    0.375V @ 0.40 ms  Day of d/c  04/19/12   Lab Results:  Recent Labs  04/18/12 0713  WBC 3.1*  HGB 10.5*  HCT 31.5*  PLT 122*   BMET  Recent Labs  04/18/12 0713 04/19/12 0700  NA 136 137  K 4.1 4.1  CL 104 106  CO2 22 21  GLUCOSE 106* 103*  BUN 36* 25*  CREATININE 2.07* 1.61*  CALCIUM 9.5 9.4   No results found for this basename: TROPONINI, CK, MB,  in the last 72 hours  Lab Results  Component Value Date   CHOL 123 04/01/2012   HDL 78 04/01/2012   LDLCALC 38 04/01/2012   TRIG 37 04/01/2012   CHOLHDL 1.6 04/01/2012   Lab Results  Component Value Date   HGBA1C 6.2* 04/01/2012     Lab Results  Component Value Date   TSH 2.173 04/18/2012    Hepatic Function Panel  Recent Labs  04/18/12 0713  PROT 7.0  ALBUMIN 3.6  AST 16  ALT 13  ALKPHOS 155*  BILITOT 0.3   Studies/Results: ICD placed 04/18/12 Generator Medtronic [place] model DDBB1D4 serial number [id number]  Right atrial lead  Medtronic [id number] serial number [id number]  Right ventricular lead Medtronic [id number] serial number [id number]  Medications: I have reviewed the patient's current medications. Marland Kitchen amLODipine  10 mg Oral Daily  . atorvastatin  40 mg Oral q1800  . carvedilol  6.25 mg Oral BID WC  . clopidogrel  75 mg Oral Q breakfast  . furosemide  20 mg Oral Daily  . hydrALAZINE  25 mg Oral BID  . insulin aspart  0-9 Units Subcutaneous TID WC  . potassium chloride SA  20 mEq Oral BID  . traZODone  150 mg Oral QHS   Assessment/Plan: Principal Problem:   S/P ICD (internal cardiac defibrillator) procedure, 04/18/12, Medtronic device ro ICM Active Problems:   At risk for sudden cardiac death   Cardiomyopathy, ischemic, EF 25-30%   DIABETES MELLITUS, TYPE II   CORONARY ARTERY DISEASE, CABG 2006, X 3 LIMA-LAD, SVG-LCX OM, VG-PDA   CKD (chronic kidney disease) stage 3, GFR 30-59 ml/min   PLAN:check CXR, if stable d/c home and follow up with me in 5-10 days, for ICD site check, then Dr. Sallyanne Kuster one  month after.     LOS: 1 day   INGOLD,LAURA R 04/19/2012, 8:44 AM

## 2012-04-19 NOTE — Discharge Summary (Signed)
Stable s/p ICD placement for Cardiomyopathy.  Wound check next week.  ROV with Dr. Sallyanne Kuster in ~5-6 weeks.  Leonie Man, M.D., M.S. [facility] [address] [place], [place]  [address]  [phone] Pager # [phone] 04/19/2012 10:00 PM

## 2012-04-21 LAB — GLUCOSE, CAPILLARY: Glucose-Capillary: 96 mg/dL (ref 70–99)

## 2012-04-22 DIAGNOSIS — I2699 Other pulmonary embolism without acute cor pulmonale: Secondary | ICD-10-CM | POA: Diagnosis not present

## 2012-04-22 DIAGNOSIS — I5043 Acute on chronic combined systolic (congestive) and diastolic (congestive) heart failure: Secondary | ICD-10-CM | POA: Diagnosis not present

## 2012-04-22 DIAGNOSIS — I10 Essential (primary) hypertension: Secondary | ICD-10-CM | POA: Diagnosis not present

## 2012-04-22 DIAGNOSIS — E119 Type 2 diabetes mellitus without complications: Secondary | ICD-10-CM | POA: Diagnosis not present

## 2012-04-22 DIAGNOSIS — I2510 Atherosclerotic heart disease of native coronary artery without angina pectoris: Secondary | ICD-10-CM | POA: Diagnosis not present

## 2012-04-22 DIAGNOSIS — I509 Heart failure, unspecified: Secondary | ICD-10-CM | POA: Diagnosis not present

## 2012-05-01 DIAGNOSIS — I509 Heart failure, unspecified: Secondary | ICD-10-CM | POA: Diagnosis not present

## 2012-05-01 DIAGNOSIS — I5043 Acute on chronic combined systolic (congestive) and diastolic (congestive) heart failure: Secondary | ICD-10-CM | POA: Diagnosis not present

## 2012-05-01 DIAGNOSIS — I2699 Other pulmonary embolism without acute cor pulmonale: Secondary | ICD-10-CM | POA: Diagnosis not present

## 2012-05-01 DIAGNOSIS — I2510 Atherosclerotic heart disease of native coronary artery without angina pectoris: Secondary | ICD-10-CM | POA: Diagnosis not present

## 2012-05-01 DIAGNOSIS — E119 Type 2 diabetes mellitus without complications: Secondary | ICD-10-CM | POA: Diagnosis not present

## 2012-05-01 DIAGNOSIS — I10 Essential (primary) hypertension: Secondary | ICD-10-CM | POA: Diagnosis not present

## 2012-05-09 DIAGNOSIS — I2699 Other pulmonary embolism without acute cor pulmonale: Secondary | ICD-10-CM | POA: Diagnosis not present

## 2012-05-09 DIAGNOSIS — I2510 Atherosclerotic heart disease of native coronary artery without angina pectoris: Secondary | ICD-10-CM | POA: Diagnosis not present

## 2012-05-09 DIAGNOSIS — I10 Essential (primary) hypertension: Secondary | ICD-10-CM | POA: Diagnosis not present

## 2012-05-09 DIAGNOSIS — E119 Type 2 diabetes mellitus without complications: Secondary | ICD-10-CM | POA: Diagnosis not present

## 2012-05-09 DIAGNOSIS — I509 Heart failure, unspecified: Secondary | ICD-10-CM | POA: Diagnosis not present

## 2012-05-09 DIAGNOSIS — I5043 Acute on chronic combined systolic (congestive) and diastolic (congestive) heart failure: Secondary | ICD-10-CM | POA: Diagnosis not present

## 2012-05-12 DIAGNOSIS — I5043 Acute on chronic combined systolic (congestive) and diastolic (congestive) heart failure: Secondary | ICD-10-CM | POA: Diagnosis not present

## 2012-05-12 DIAGNOSIS — I10 Essential (primary) hypertension: Secondary | ICD-10-CM | POA: Diagnosis not present

## 2012-05-12 DIAGNOSIS — E119 Type 2 diabetes mellitus without complications: Secondary | ICD-10-CM | POA: Diagnosis not present

## 2012-05-12 DIAGNOSIS — I2510 Atherosclerotic heart disease of native coronary artery without angina pectoris: Secondary | ICD-10-CM | POA: Diagnosis not present

## 2012-05-12 DIAGNOSIS — I509 Heart failure, unspecified: Secondary | ICD-10-CM | POA: Diagnosis not present

## 2012-05-12 DIAGNOSIS — I2699 Other pulmonary embolism without acute cor pulmonale: Secondary | ICD-10-CM | POA: Diagnosis not present

## 2012-05-19 DIAGNOSIS — R7301 Impaired fasting glucose: Secondary | ICD-10-CM | POA: Diagnosis not present

## 2012-05-19 DIAGNOSIS — I10 Essential (primary) hypertension: Secondary | ICD-10-CM | POA: Diagnosis not present

## 2012-05-19 DIAGNOSIS — I509 Heart failure, unspecified: Secondary | ICD-10-CM | POA: Diagnosis not present

## 2012-05-19 DIAGNOSIS — I2510 Atherosclerotic heart disease of native coronary artery without angina pectoris: Secondary | ICD-10-CM | POA: Diagnosis not present

## 2012-05-19 DIAGNOSIS — D649 Anemia, unspecified: Secondary | ICD-10-CM | POA: Diagnosis not present

## 2012-05-19 DIAGNOSIS — E559 Vitamin D deficiency, unspecified: Secondary | ICD-10-CM | POA: Diagnosis not present

## 2012-05-19 DIAGNOSIS — F17200 Nicotine dependence, unspecified, uncomplicated: Secondary | ICD-10-CM | POA: Diagnosis not present

## 2012-05-24 ENCOUNTER — Emergency Department (INDEPENDENT_AMBULATORY_CARE_PROVIDER_SITE_OTHER)
Admission: EM | Admit: 2012-05-24 | Discharge: 2012-05-24 | Disposition: A | Discharge status: Home / Self Care | Payer: Medicare Other | Source: Home / Self Care

## 2012-05-24 ENCOUNTER — Encounter (HOSPITAL_COMMUNITY): Payer: Self-pay | Admitting: Emergency Medicine

## 2012-05-24 DIAGNOSIS — R197 Diarrhea, unspecified: Secondary | ICD-10-CM | POA: Diagnosis not present

## 2012-05-24 DIAGNOSIS — E875 Hyperkalemia: Secondary | ICD-10-CM

## 2012-05-24 LAB — POCT I-STAT, CHEM 8
BUN: 43 mg/dL — ABNORMAL HIGH (ref 6–23)
Hemoglobin: 11.9 g/dL — ABNORMAL LOW (ref 13.0–17.0)
Potassium: 5.2 mEq/L — ABNORMAL HIGH (ref 3.5–5.1)
Sodium: 146 mEq/L — ABNORMAL HIGH (ref 135–145)
TCO2: 25 mmol/L (ref 0–100)

## 2012-05-24 LAB — OCCULT BLOOD, POC DEVICE: Fecal Occult Bld: NEGATIVE

## 2012-05-24 MED ORDER — LOPERAMIDE HCL 2 MG PO CAPS: 2.0000 mg | ORAL_CAPSULE | Freq: Four times a day (QID) | ORAL | Status: DC | PRN

## 2012-05-24 NOTE — ED Notes (Signed)
Pt c/o diarrhea x1.5 weeks and vomiting x2 days Last loose stool was around 0600 today and last episode of vomiting was yest Taking Loperamide/hydrochloride 2mg  w/no relief Sx also include: loss of appetite but drinking fluids Denies: fever  He is alert and oriented w/no signs of acute distress.
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
## 2012-05-27 LAB — GI PATHOGEN PANEL BY PCR, STOOL
C difficile toxin A/B: NEGATIVE
Campylobacter by PCR: NEGATIVE
Cryptosporidium by PCR: NEGATIVE
E coli (ETEC) LT/ST: NEGATIVE
Norovirus GI/GII: NEGATIVE
Rotavirus A by PCR: NEGATIVE

## 2012-06-01 ENCOUNTER — Inpatient Hospital Stay (HOSPITAL_COMMUNITY)
Admission: EM | Admit: 2012-06-01 | Discharge: 2012-06-03 | DRG: 293 | Disposition: A | Discharge status: Home / Self Care | Payer: Medicare Other | Attending: Cardiology | Admitting: Cardiology

## 2012-06-01 ENCOUNTER — Emergency Department (HOSPITAL_COMMUNITY): Payer: Medicare Other

## 2012-06-01 ENCOUNTER — Encounter (HOSPITAL_COMMUNITY): Payer: Self-pay | Admitting: Emergency Medicine

## 2012-06-01 DIAGNOSIS — I255 Ischemic cardiomyopathy: Secondary | ICD-10-CM | POA: Diagnosis present

## 2012-06-01 DIAGNOSIS — I5043 Acute on chronic combined systolic (congestive) and diastolic (congestive) heart failure: Principal | ICD-10-CM | POA: Diagnosis present

## 2012-06-01 DIAGNOSIS — I2510 Atherosclerotic heart disease of native coronary artery without angina pectoris: Secondary | ICD-10-CM | POA: Diagnosis present

## 2012-06-01 DIAGNOSIS — I509 Heart failure, unspecified: Secondary | ICD-10-CM | POA: Diagnosis not present

## 2012-06-01 DIAGNOSIS — E119 Type 2 diabetes mellitus without complications: Secondary | ICD-10-CM | POA: Diagnosis present

## 2012-06-01 DIAGNOSIS — Z8249 Family history of ischemic heart disease and other diseases of the circulatory system: Secondary | ICD-10-CM

## 2012-06-01 DIAGNOSIS — F1210 Cannabis abuse, uncomplicated: Secondary | ICD-10-CM | POA: Diagnosis present

## 2012-06-01 DIAGNOSIS — N183 Chronic kidney disease, stage 3 unspecified: Secondary | ICD-10-CM | POA: Diagnosis present

## 2012-06-01 DIAGNOSIS — Z95810 Presence of automatic (implantable) cardiac defibrillator: Secondary | ICD-10-CM

## 2012-06-01 DIAGNOSIS — J4489 Other specified chronic obstructive pulmonary disease: Secondary | ICD-10-CM | POA: Diagnosis present

## 2012-06-01 DIAGNOSIS — I129 Hypertensive chronic kidney disease with stage 1 through stage 4 chronic kidney disease, or unspecified chronic kidney disease: Secondary | ICD-10-CM | POA: Diagnosis present

## 2012-06-01 DIAGNOSIS — R0602 Shortness of breath: Secondary | ICD-10-CM | POA: Diagnosis not present

## 2012-06-01 DIAGNOSIS — J449 Chronic obstructive pulmonary disease, unspecified: Secondary | ICD-10-CM | POA: Diagnosis present

## 2012-06-01 DIAGNOSIS — Z87891 Personal history of nicotine dependence: Secondary | ICD-10-CM

## 2012-06-01 DIAGNOSIS — Z951 Presence of aortocoronary bypass graft: Secondary | ICD-10-CM

## 2012-06-01 DIAGNOSIS — I2589 Other forms of chronic ischemic heart disease: Secondary | ICD-10-CM | POA: Diagnosis present

## 2012-06-01 DIAGNOSIS — E089 Diabetes mellitus due to underlying condition without complications: Secondary | ICD-10-CM | POA: Diagnosis present

## 2012-06-01 DIAGNOSIS — Z833 Family history of diabetes mellitus: Secondary | ICD-10-CM

## 2012-06-01 DIAGNOSIS — Z9119 Patient's noncompliance with other medical treatment and regimen: Secondary | ICD-10-CM

## 2012-06-01 DIAGNOSIS — R0902 Hypoxemia: Secondary | ICD-10-CM | POA: Diagnosis present

## 2012-06-01 DIAGNOSIS — Z91199 Patient's noncompliance with other medical treatment and regimen due to unspecified reason: Secondary | ICD-10-CM

## 2012-06-01 DIAGNOSIS — F1410 Cocaine abuse, uncomplicated: Secondary | ICD-10-CM | POA: Diagnosis present

## 2012-06-01 DIAGNOSIS — I10 Essential (primary) hypertension: Secondary | ICD-10-CM

## 2012-06-01 HISTORY — DX: Chronic combined systolic (congestive) and diastolic (congestive) heart failure: I50.42

## 2012-06-01 LAB — POCT I-STAT, CHEM 8
Chloride: 118 mEq/L — ABNORMAL HIGH (ref 96–112)
HCT: 29 % — ABNORMAL LOW (ref 39.0–52.0)
Hemoglobin: 9.9 g/dL — ABNORMAL LOW (ref 13.0–17.0)
Potassium: 4.2 mEq/L (ref 3.5–5.1)
Sodium: 143 mEq/L (ref 135–145)

## 2012-06-01 LAB — CBC
Platelets: 142 10*3/uL — ABNORMAL LOW (ref 150–400)
RBC: 3.2 MIL/uL — ABNORMAL LOW (ref 4.22–5.81)
RDW: 14.2 % (ref 11.5–15.5)
WBC: 3.7 10*3/uL — ABNORMAL LOW (ref 4.0–10.5)

## 2012-06-01 LAB — POCT I-STAT TROPONIN I: Troponin i, poc: 0.03 ng/mL (ref 0.00–0.08)

## 2012-06-01 MED ORDER — FUROSEMIDE 10 MG/ML IJ SOLN
40.0000 mg | Freq: Once | INTRAMUSCULAR | Status: AC
  Administered 2012-06-02: 40 mg via INTRAVENOUS
  Filled 2012-06-01: qty 4

## 2012-06-01 NOTE — ED Notes (Signed)
Pt to ED via [facility].  C/o difficulty breathing and increased effort to breath when lying down since 4pm.  History of CHF.  Also c/o pitting edema to bilateral lower extremities (R>L).
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
## 2012-06-02 ENCOUNTER — Encounter (HOSPITAL_COMMUNITY): Payer: Self-pay | Admitting: Nurse Practitioner

## 2012-06-02 DIAGNOSIS — I10 Essential (primary) hypertension: Secondary | ICD-10-CM

## 2012-06-02 DIAGNOSIS — I5043 Acute on chronic combined systolic (congestive) and diastolic (congestive) heart failure: Secondary | ICD-10-CM | POA: Diagnosis present

## 2012-06-02 DIAGNOSIS — I509 Heart failure, unspecified: Secondary | ICD-10-CM | POA: Diagnosis present

## 2012-06-02 LAB — CBC
HCT: 30.8 % — ABNORMAL LOW (ref 39.0–52.0)
MCHC: 34.7 g/dL (ref 30.0–36.0)
MCV: 89.5 fL (ref 78.0–100.0)
Platelets: 160 10*3/uL (ref 150–400)
RDW: 14.2 % (ref 11.5–15.5)
WBC: 4 10*3/uL (ref 4.0–10.5)

## 2012-06-02 LAB — BASIC METABOLIC PANEL
BUN: 37 mg/dL — ABNORMAL HIGH (ref 6–23)
Chloride: 110 mEq/L (ref 96–112)
Creatinine, Ser: 1.77 mg/dL — ABNORMAL HIGH (ref 0.50–1.35)
GFR calc Af Amer: 45 mL/min — ABNORMAL LOW (ref >90)
GFR calc non Af Amer: 39 mL/min — ABNORMAL LOW (ref >90)

## 2012-06-02 LAB — PRO B NATRIURETIC PEPTIDE: Pro B Natriuretic peptide (BNP): 3820 pg/mL — ABNORMAL HIGH (ref 0–125)

## 2012-06-02 MED ORDER — IPRATROPIUM BROMIDE 0.02 % IN SOLN: 0.5000 mg | RESPIRATORY_TRACT | Status: DC | PRN

## 2012-06-02 MED ORDER — SODIUM CHLORIDE 0.9 % IJ SOLN
3.0000 mL | Freq: Two times a day (BID) | INTRAMUSCULAR | Status: DC
  Administered 2012-06-02 – 2012-06-03 (×2): 3 mL via INTRAVENOUS

## 2012-06-02 MED ORDER — HYDRALAZINE HCL 25 MG PO TABS
25.0000 mg | ORAL_TABLET | Freq: Two times a day (BID) | ORAL | Status: DC
  Administered 2012-06-02: 25 mg via ORAL
  Filled 2012-06-02 (×3): qty 1

## 2012-06-02 MED ORDER — ENOXAPARIN SODIUM 40 MG/0.4ML ~~LOC~~ SOLN
40.0000 mg | Freq: Every day | SUBCUTANEOUS | Status: DC
  Administered 2012-06-02 – 2012-06-03 (×2): 40 mg via SUBCUTANEOUS
  Filled 2012-06-02 (×3): qty 0.4

## 2012-06-02 MED ORDER — AMLODIPINE BESYLATE 10 MG PO TABS
10.0000 mg | ORAL_TABLET | Freq: Every day | ORAL | Status: DC
  Administered 2012-06-02 – 2012-06-03 (×2): 10 mg via ORAL
  Filled 2012-06-02 (×3): qty 1

## 2012-06-02 MED ORDER — SODIUM CHLORIDE 0.9 % IJ SOLN
3.0000 mL | Freq: Two times a day (BID) | INTRAMUSCULAR | Status: DC
  Administered 2012-06-02 – 2012-06-03 (×3): 3 mL via INTRAVENOUS

## 2012-06-02 MED ORDER — TRAZODONE HCL 150 MG PO TABS
150.0000 mg | ORAL_TABLET | Freq: Once | ORAL | Status: AC
  Administered 2012-06-02: 150 mg via ORAL
  Filled 2012-06-02: qty 1

## 2012-06-02 MED ORDER — ALUM & MAG HYDROXIDE-SIMETH 200-200-20 MG/5ML PO SUSP: 30.0000 mL | Freq: Four times a day (QID) | ORAL | Status: DC | PRN

## 2012-06-02 MED ORDER — FUROSEMIDE 10 MG/ML IJ SOLN
40.0000 mg | Freq: Two times a day (BID) | INTRAMUSCULAR | Status: DC
  Administered 2012-06-02 – 2012-06-03 (×3): 40 mg via INTRAVENOUS
  Filled 2012-06-02 (×4): qty 4

## 2012-06-02 MED ORDER — ONDANSETRON HCL 4 MG/2ML IJ SOLN: 4.0000 mg | Freq: Four times a day (QID) | INTRAMUSCULAR | Status: DC | PRN

## 2012-06-02 MED ORDER — CLOPIDOGREL BISULFATE 75 MG PO TABS
75.0000 mg | ORAL_TABLET | Freq: Every day | ORAL | Status: DC
  Administered 2012-06-02 – 2012-06-03 (×2): 75 mg via ORAL
  Filled 2012-06-02 (×4): qty 1

## 2012-06-02 MED ORDER — ONDANSETRON HCL 4 MG PO TABS: 4.0000 mg | ORAL_TABLET | Freq: Four times a day (QID) | ORAL | Status: DC | PRN

## 2012-06-02 MED ORDER — CARVEDILOL 6.25 MG PO TABS
6.2500 mg | ORAL_TABLET | Freq: Two times a day (BID) | ORAL | Status: DC
  Administered 2012-06-02 – 2012-06-03 (×3): 6.25 mg via ORAL
  Filled 2012-06-02 (×7): qty 1

## 2012-06-02 MED ORDER — HYDROCODONE-ACETAMINOPHEN 5-325 MG PO TABS: 1.0000 | ORAL_TABLET | ORAL | Status: DC | PRN

## 2012-06-02 MED ORDER — HYDRALAZINE HCL 50 MG PO TABS
50.0000 mg | ORAL_TABLET | Freq: Two times a day (BID) | ORAL | Status: DC
  Administered 2012-06-02 – 2012-06-03 (×2): 50 mg via ORAL
  Filled 2012-06-02 (×3): qty 1

## 2012-06-02 MED ORDER — ALBUTEROL SULFATE (5 MG/ML) 0.5% IN NEBU: 2.5000 mg | INHALATION_SOLUTION | RESPIRATORY_TRACT | Status: DC | PRN

## 2012-06-02 MED ORDER — ATORVASTATIN CALCIUM 40 MG PO TABS
40.0000 mg | ORAL_TABLET | Freq: Every day | ORAL | Status: DC
  Administered 2012-06-02: 40 mg via ORAL
  Filled 2012-06-02 (×3): qty 1

## 2012-06-02 MED ORDER — SODIUM CHLORIDE 0.9 % IV SOLN: 250.0000 mL | INTRAVENOUS | Status: DC | PRN

## 2012-06-02 MED ORDER — SODIUM CHLORIDE 0.9 % IJ SOLN: 3.0000 mL | INTRAMUSCULAR | Status: DC | PRN

## 2012-06-02 NOTE — ED Notes (Signed)
Pt. Ordered to ambulate w/pulse ox prior to Lasix admin. per Dr. Marnette Burgess.  Pt. Was able to ambulate and started off at 91% on room air.  Pt. Walked aprox. 30 feet and O2 rose to 94% on room air.  Pt. turned to return to room and O2 quickly dropped to 85% on room air.  Pt. was returned to bed and placed on 2L of O2.

## 2012-06-02 NOTE — Progress Notes (Signed)
Admission H&P reviewed Patient seen and examined this morning. Informs his breathing to be slightly better today. Denies any change in his medications or been noncompliant with his diet or medications. Denies any recent illness. Started on IV Lasix 40 mg twice a day with almost 3 L negative balance overnight. Left swelling appears to be much improved. She still feels about 60% of his normal in terms of his breathing. Continue diuresis with I./O monitoring. [facility].  Remaining medical issues are stable.

## 2012-06-02 NOTE — Progress Notes (Signed)
Utilization Review Completed.   Zaray Gatchel, RN, BSN Nurse Case Manager  [phone]  
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
## 2012-06-02 NOTE — Consult Note (Addendum)
ATTENDING ATTESTATION:  I have seen and examined the patient along with [facility].  I have reviewed the chart, notes and new data.  I agree with his's findings, examination & recommendations as noted above.  Brief Description: 67 y/o man well known to [facility] with ICM - h/o CABG & now ICD roughly 1 month ago.   Was just seen last week & was without complaints.    Key new complaints: admitted for worsening LLE edema, orthopnea & "wheezing" this Sunday.  Was scared with the wheezing / choking sensation.  Denies CP & had no DOE or orthopnea last week.     Key examination changes: Edema has all but resolved, JVP ~ 14 cmH2O.  Lungs are clear without wheezing.   Key new findings / data: Mildly increased Cr to 1.77, good UOP; admit proBNP ~3800  PLAN:  Agree with converting to PO Lasix in am with dose regimen delineated by Mr. Samara Snide.    BP remains slighly elevated - can increase Hydralazine dose to 50mg  bid (ACE-I/ARB not being used as yet, due ot baselin CKD 3)  Would anticipate that he should be ready for d/c home in 1-2 days. Will need close f/u @[facility]  - currently he is scheduled for 3 months out.  Glycemic control is good. [facility] will assume care.  Appreciate [facility] assistance with admission.  Leonie Man, M.D., M.S. [facility] [address] [place], [place]  [address]  [phone]  06/02/2012 2:21 PM
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
## 2012-06-03 MED ORDER — FUROSEMIDE 20 MG PO TABS: ORAL_TABLET | ORAL | Status: DC

## 2012-06-03 MED ORDER — HYDRALAZINE HCL 50 MG PO TABS: 50.0000 mg | ORAL_TABLET | Freq: Two times a day (BID) | ORAL | Status: DC

## 2012-06-03 NOTE — Progress Notes (Signed)
Subjective:  SOB improved  Objective:  Vital Signs in the last 24 hours: Temp:  [97.2 F (36.2 C)-98.2 F (36.8 C)] 97.2 F (36.2 C) (03/25 0605) Pulse Rate:  [65-81] 81 (03/25 1025) Resp:  [16-18] 18 (03/25 1025) BP: (134-150)/(61-77) 134/77 mmHg (03/25 1025) SpO2:  [97 %-100 %] 98 % (03/25 1025) Weight:  [72.984 kg (160 lb 14.4 oz)] 72.984 kg (160 lb 14.4 oz) (03/25 0605)  Intake/Output from previous day:  Intake/Output Summary (Last 24 hours) at 06/03/12 1152 Last data filed at 06/03/12 1039  Gross per 24 hour  Intake   1679 ml  Output   3425 ml  Net  -1746 ml    Physical Exam: General appearance: alert, cooperative and no distress Lungs: clear to auscultation bilaterally Heart: regular rate and rhythm Extrem- no edema     Rate: 78  Rhythm: normal sinus rhythm and PVCs, LBBB, paced  Lab Results:  Recent Labs  06/01/12 2333 06/01/12 2356 06/02/12 0750  WBC 3.7*  --  4.0  HGB 10.0* 9.9* 10.7*  PLT 142*  --  160    Recent Labs  06/01/12 2356 06/02/12 0750  NA 143 141  K 4.2 3.9  CL 118* 110  CO2  --  19  GLUCOSE 106* 100*  BUN 39* 37*  CREATININE 1.60* 1.77*   No results found for this basename: TROPONINI, CK, MB,  in the last 72 hours Hepatic Function Panel No results found for this basename: PROT, ALBUMIN, AST, ALT, ALKPHOS, BILITOT, BILIDIR, IBILI,  in the last 72 hours No results found for this basename: CHOL,  in the last 72 hours No results found for this basename: INR,  in the last 72 hours  Imaging: Imaging results have been reviewed  Cardiac Studies:  Assessment/Plan:   Principal Problem:   Acute on chronic combined systolic and diastolic CHF, NYHA class 3 Active Problems:   DIABETES MELLITUS, TYPE II   ABUSE, COCAINE, EPISODIC   CORONARY ARTERY DISEASE, CABG 2006, X 3 LIMA-LAD, SVG-LCX OM, VG-PDA   CKD (chronic kidney disease) stage 3, GFR 30-59 ml/min   COPD (chronic obstructive pulmonary disease)   Cardiomyopathy, ischemic,  EF 25-30%   CHF (congestive heart failure)   Essential hypertension   Plan- possible discharge this afternoon. I don't know why he had this episode, he says he has been compliant with his medications and diet. I believe he qualifies as a TCM pt secondary to CHF with multiple other co-morbidities. He will require early follow up.    Kerin Ransom PA-C 06/03/2012, 11:52 AM   I have seen and examined the patient along with Kerin Ransom PA-C.  I have reviewed the chart, notes and new data.  I agree with PA's note.  Key new complaints: back to baseline breathing status Key examination changes: no JVD, S3 or rales and edema of lower extremities resolved. Key new findings / data: marginal increase in creatinine.  PLAN: DC home. Needs early follow-up. CHF management is complicated by CKD, cause of dyspnea diagnosis sometimes masked by COPD. Recommend office visit within the week. Over 30 minutes spent discussing daily weighhts, sodium restriction, sign/symptoms of acute HF, diuretic management, K supplementation, etc. Set his ICD to report thoracic impedance parameters monthly.  Sanda Klein, MD, [place] [phone] 06/03/2012, 1:24 PM

## 2012-06-03 NOTE — Progress Notes (Signed)
Discharge instructions and prescriptions given to pt

## 2012-06-03 NOTE — Discharge Summary (Signed)
Patient ID: [name],  MRN: [id number], DOB/AGE: 05-02-1945 67 y.o.  Admit date: 06/01/2012 Discharge date: 06/03/2012  Primary Care Provider:  Primary Cardiologist: Dr Sallyanne Kuster  Discharge Diagnoses Principal Problem:   Acute on chronic combined systolic and diastolic CHF, NYHA class 3 Active Problems:   DIABETES MELLITUS, TYPE II   ABUSE, COCAINE, EPISODIC   CORONARY ARTERY DISEASE, CABG 2006, X 3 LIMA-LAD, SVG-LCX OM, VG-PDA   CKD (chronic kidney disease) stage 3, GFR 30-59 ml/min   COPD (chronic obstructive pulmonary disease)   Cardiomyopathy, ischemic, EF 25-30%   CHF (congestive heart failure)   Essential hypertension     Hospital Course   The patient is a 67 yo AA male with a history of recent Medtronic ICD placement 04/18/12, ischemic cardiomyopathy with EF of 25-30%, NY heart failure clawsss 2-3, LBBB, CAD - CABG x3, DM,and CKD stage 3. His last cardiac cath was in Jan 2014 revealed Severe native CAD, three patent grafts with diffuse downstream disease in the RCA and LAD. He has been treated medically. He was admitted 3/231/4 with increasing SOB and LE edema. He denies dietary indiscretion or non compliance with medicines. He was admitted to telemetry and his medications adjusted. We increased his daily Lasix and Hydralazine. Dr Sallyanne Kuster saw him 06/03/12 and spent  over 30 minutes discussing daily weighhts, sodium restriction, sign/symptoms of acute HF, diuretic management, K supplementation, etc. His ICD has been set to report thoracic impedance parameters  He will require early follow up with an MLP in the office in 5-7 days.   Discharge Vitals:  Blood pressure 134/77, pulse 81, temperature 97.2 F (36.2 C), temperature source Oral, resp. rate 18, height 5' 8.5" (1.74 m), weight 72.984 kg (160 lb 14.4 oz), SpO2 98.00%.    Labs: Results for orders placed during the hospital encounter of 06/01/12 (from the past 48 hour(s))  CBC     Status: Abnormal   Collection Time   06/01/12 11:33 PM      Result Value Range   WBC 3.7 (*) 4.0 - 10.5 K/uL   RBC 3.20 (*) 4.22 - 5.81 MIL/uL   Hemoglobin 10.0 (*) 13.0 - 17.0 g/dL   HCT 28.8 (*) 39.0 - 52.0 %   MCV 90.0  78.0 - 100.0 fL   MCH 31.3  26.0 - 34.0 pg   MCHC 34.7  30.0 - 36.0 g/dL   RDW 14.2  11.5 - 15.5 %   Platelets 142 (*) 150 - 400 K/uL  PRO B NATRIURETIC PEPTIDE     Status: Abnormal   Collection Time    06/01/12 11:33 PM      Result Value Range   Pro B Natriuretic peptide (BNP) 2616.0 (*) 0 - 125 pg/mL  POCT I-STAT TROPONIN I     Status: None   Collection Time    06/01/12 11:54 PM      Result Value Range   Troponin i, poc 0.03  0.00 - 0.08 ng/mL   Comment 3            Comment: Due to the release kinetics of cTnI,     a negative result within the first hours     of the onset of symptoms does not rule out     myocardial infarction with certainty.     If myocardial infarction is still suspected,     repeat the test at appropriate intervals.  POCT I-STAT, CHEM 8     Status: Abnormal   Collection Time  06/01/12 11:56 PM      Result Value Range   Sodium 143  135 - 145 mEq/L   Potassium 4.2  3.5 - 5.1 mEq/L   Chloride 118 (*) 96 - 112 mEq/L   BUN 39 (*) 6 - 23 mg/dL   Creatinine, Ser 1.60 (*) 0.50 - 1.35 mg/dL   Glucose, Bld 106 (*) 70 - 99 mg/dL   Calcium, Ion 1.32 (*) 1.13 - 1.30 mmol/L   TCO2 19  0 - 100 mmol/L   Hemoglobin 9.9 (*) 13.0 - 17.0 g/dL   HCT 29.0 (*) 39.0 - 52.0 %  CBC     Status: Abnormal   Collection Time    06/02/12  7:50 AM      Result Value Range   WBC 4.0  4.0 - 10.5 K/uL   RBC 3.44 (*) 4.22 - 5.81 MIL/uL   Hemoglobin 10.7 (*) 13.0 - 17.0 g/dL   HCT 30.8 (*) 39.0 - 52.0 %   MCV 89.5  78.0 - 100.0 fL   MCH 31.1  26.0 - 34.0 pg   MCHC 34.7  30.0 - 36.0 g/dL   RDW 14.2  11.5 - 15.5 %   Platelets 160  150 - 400 K/uL  BASIC METABOLIC PANEL     Status: Abnormal   Collection Time    06/02/12  7:50 AM      Result Value Range   Sodium 141  135 - 145 mEq/L   Potassium  3.9  3.5 - 5.1 mEq/L   Chloride 110  96 - 112 mEq/L   CO2 19  19 - 32 mEq/L   Glucose, Bld 100 (*) 70 - 99 mg/dL   BUN 37 (*) 6 - 23 mg/dL   Creatinine, Ser 1.77 (*) 0.50 - 1.35 mg/dL   Calcium 9.6  8.4 - 10.5 mg/dL   GFR calc non Af Amer 39 (*) >90 mL/min   GFR calc Af Amer 45 (*) >90 mL/min   Comment:            The eGFR has been calculated     using the CKD EPI equation.     This calculation has not been     validated in all clinical     situations.     eGFR's persistently     <90 mL/min signify     possible Chronic Kidney Disease.  PRO B NATRIURETIC PEPTIDE     Status: Abnormal   Collection Time    06/02/12  7:50 AM      Result Value Range   Pro B Natriuretic peptide (BNP) 3820.0 (*) 0 - 125 pg/mL    Disposition:  Follow-up Information   Follow up with Sanda Klein, MD. (office will call you)    Contact information:   [address] [place] [address] [phone]       Discharge Medications:    Medication List    TAKE these medications       amLODipine 10 MG tablet  Commonly known as:  NORVASC  Take 10 mg by mouth daily.     atorvastatin 40 MG tablet  Commonly known as:  LIPITOR  Take 40 mg by mouth daily at 6 PM.     carvedilol 6.25 MG tablet  Commonly known as:  COREG  Take 6.25 mg by mouth 2 (two) times daily with a meal.     clopidogrel 75 MG tablet  Commonly known as:  PLAVIX  Take 75 mg by mouth daily.  furosemide 20 MG tablet  Commonly known as:  LASIX  Take 40mg  on Mondays, Wednesdays, and Fridays. Take 20mg  other days.     hydrALAZINE 50 MG tablet  Commonly known as:  APRESOLINE  Take 1 tablet (50 mg total) by mouth 2 (two) times daily.     HYDROcodone-acetaminophen 5-325 MG per tablet  Commonly known as:  NORCO/VICODIN  Take 1-2 tablets by mouth every 4 (four) hours as needed for pain.     potassium chloride SA 20 MEQ tablet  Commonly known as:  K-DUR,KLOR-CON  Take 20 mEq by mouth 2 (two) times daily.      traZODone 150 MG tablet  Commonly known as:  DESYREL  Take 150 mg by mouth at bedtime.     Vitamin D (Ergocalciferol) 50000 UNITS Caps  Commonly known as:  DRISDOL  Take 50,000 Units by mouth every 7 (seven) days. Sunday         Duration of Discharge Encounter: Greater than 30 minutes including physician time.  Angelena Form PA-C 06/03/2012 1:53 PM

## 2012-06-19 DIAGNOSIS — I10 Essential (primary) hypertension: Secondary | ICD-10-CM | POA: Diagnosis not present

## 2012-06-19 DIAGNOSIS — I2510 Atherosclerotic heart disease of native coronary artery without angina pectoris: Secondary | ICD-10-CM | POA: Diagnosis not present

## 2012-06-19 DIAGNOSIS — F17200 Nicotine dependence, unspecified, uncomplicated: Secondary | ICD-10-CM | POA: Diagnosis not present

## 2012-06-19 DIAGNOSIS — H9209 Otalgia, unspecified ear: Secondary | ICD-10-CM | POA: Diagnosis not present

## 2012-06-19 DIAGNOSIS — I509 Heart failure, unspecified: Secondary | ICD-10-CM | POA: Diagnosis not present

## 2012-06-19 DIAGNOSIS — R7301 Impaired fasting glucose: Secondary | ICD-10-CM | POA: Diagnosis not present

## 2012-06-19 DIAGNOSIS — D649 Anemia, unspecified: Secondary | ICD-10-CM | POA: Diagnosis not present

## 2012-07-25 DIAGNOSIS — I509 Heart failure, unspecified: Secondary | ICD-10-CM | POA: Diagnosis not present

## 2012-07-25 DIAGNOSIS — I2510 Atherosclerotic heart disease of native coronary artery without angina pectoris: Secondary | ICD-10-CM | POA: Diagnosis not present

## 2012-07-25 DIAGNOSIS — I10 Essential (primary) hypertension: Secondary | ICD-10-CM | POA: Diagnosis not present

## 2012-07-25 DIAGNOSIS — D649 Anemia, unspecified: Secondary | ICD-10-CM | POA: Diagnosis not present

## 2012-07-25 DIAGNOSIS — R7301 Impaired fasting glucose: Secondary | ICD-10-CM | POA: Diagnosis not present

## 2012-07-25 DIAGNOSIS — F17200 Nicotine dependence, unspecified, uncomplicated: Secondary | ICD-10-CM | POA: Diagnosis not present

## 2012-07-25 DIAGNOSIS — M25559 Pain in unspecified hip: Secondary | ICD-10-CM | POA: Diagnosis not present

## 2012-09-18 ENCOUNTER — Other Ambulatory Visit: Payer: Self-pay

## 2012-09-24 ENCOUNTER — Ambulatory Visit (INDEPENDENT_AMBULATORY_CARE_PROVIDER_SITE_OTHER): Payer: Medicare Other | Admitting: *Deleted

## 2012-09-24 DIAGNOSIS — I2589 Other forms of chronic ischemic heart disease: Secondary | ICD-10-CM

## 2012-09-24 NOTE — Progress Notes (Signed)
Patient presents to the office today for a demonstration on how to set up his Carelink monitor and his [name]. Patient voiced understanding of the demo given, and was encouraged to call once he has gotten his monitor hooked up to confirm that it was received.

## 2012-09-30 ENCOUNTER — Telehealth: Payer: Self-pay | Admitting: Cardiovascular Disease

## 2012-09-30 NOTE — Telephone Encounter (Signed)
Pt is calling stating that he needs help with his monitor. Can someone please call him back to help him out.

## 2012-09-30 NOTE — Telephone Encounter (Signed)
Returned call.  Pt with concerns about monitor.  Spoke w/ S. Tye Savoy, CMA and she will talk w/ pt.  Call transferred.  Message forwarded to S. Tye Savoy, CMA r/t device/monitor concerns.

## 2012-10-03 DIAGNOSIS — I509 Heart failure, unspecified: Secondary | ICD-10-CM | POA: Diagnosis not present

## 2012-10-03 DIAGNOSIS — R7301 Impaired fasting glucose: Secondary | ICD-10-CM | POA: Diagnosis not present

## 2012-10-03 DIAGNOSIS — D649 Anemia, unspecified: Secondary | ICD-10-CM | POA: Diagnosis not present

## 2012-10-03 DIAGNOSIS — I10 Essential (primary) hypertension: Secondary | ICD-10-CM | POA: Diagnosis not present

## 2012-10-03 DIAGNOSIS — N183 Chronic kidney disease, stage 3 unspecified: Secondary | ICD-10-CM | POA: Diagnosis not present

## 2012-10-03 DIAGNOSIS — M25559 Pain in unspecified hip: Secondary | ICD-10-CM | POA: Diagnosis not present

## 2012-10-03 DIAGNOSIS — F17200 Nicotine dependence, unspecified, uncomplicated: Secondary | ICD-10-CM | POA: Diagnosis not present

## 2012-10-03 DIAGNOSIS — I2510 Atherosclerotic heart disease of native coronary artery without angina pectoris: Secondary | ICD-10-CM | POA: Diagnosis not present

## 2012-10-09 DIAGNOSIS — N401 Enlarged prostate with lower urinary tract symptoms: Secondary | ICD-10-CM | POA: Diagnosis not present

## 2012-10-09 DIAGNOSIS — N529 Male erectile dysfunction, unspecified: Secondary | ICD-10-CM | POA: Diagnosis not present

## 2012-10-28 DIAGNOSIS — B351 Tinea unguium: Secondary | ICD-10-CM | POA: Diagnosis not present

## 2012-10-28 DIAGNOSIS — E1149 Type 2 diabetes mellitus with other diabetic neurological complication: Secondary | ICD-10-CM | POA: Diagnosis not present

## 2012-11-05 ENCOUNTER — Ambulatory Visit (INDEPENDENT_AMBULATORY_CARE_PROVIDER_SITE_OTHER): Payer: Medicare Other | Admitting: Cardiovascular Disease

## 2012-11-05 ENCOUNTER — Encounter: Payer: Self-pay | Admitting: Cardiovascular Disease

## 2012-11-05 VITALS — BP 118/62 | HR 60 | Ht 68.5 in | Wt 167.0 lb

## 2012-11-05 DIAGNOSIS — I10 Essential (primary) hypertension: Secondary | ICD-10-CM | POA: Diagnosis not present

## 2012-11-05 DIAGNOSIS — Z95810 Presence of automatic (implantable) cardiac defibrillator: Secondary | ICD-10-CM

## 2012-11-05 DIAGNOSIS — I5043 Acute on chronic combined systolic (congestive) and diastolic (congestive) heart failure: Secondary | ICD-10-CM | POA: Diagnosis not present

## 2012-11-05 DIAGNOSIS — I255 Ischemic cardiomyopathy: Secondary | ICD-10-CM

## 2012-11-05 DIAGNOSIS — I2589 Other forms of chronic ischemic heart disease: Secondary | ICD-10-CM | POA: Diagnosis not present

## 2012-11-05 DIAGNOSIS — I509 Heart failure, unspecified: Secondary | ICD-10-CM | POA: Diagnosis not present

## 2012-11-05 DIAGNOSIS — I2510 Atherosclerotic heart disease of native coronary artery without angina pectoris: Secondary | ICD-10-CM

## 2012-11-05 NOTE — Assessment & Plan Note (Signed)
Followed by Dr. Sallyanne Kuster  who last saw him in the office on 06/10/12. He will be educated on CareLink remote

## 2012-11-05 NOTE — Patient Instructions (Addendum)
Your physician wants you to follow-up in: 6 months with an extender and 1 year with Dr Berry. You will receive a reminder letter in the mail two months in advance. If you don't receive a letter, please call our office to schedule the follow-up appointment.  

## 2012-11-05 NOTE — Progress Notes (Signed)
11/05/2012 [name]   March 10, 1946  [id number]  Primary Physician Glo Herring., MD Primary Cardiologist: John Harp MD Renae Gloss   HPI:  [name] is a 59 show thin appearing African American male history of CAD status post bypass grafting x3 in 2006 with a LIMA to his LAD, vein to circumflex and PDA. His other problems include COPD with tobacco abuse, diabetes, hypertension, left bundle branch block and chronic kidney disease. He had an IV ICD placed in February 2014 followup by Dr. Sallyanne Kuster in our office. He denies chest pain or shortness of breath.   Current Outpatient Prescriptions  Medication Sig Dispense Refill  . amLODipine (NORVASC) 10 MG tablet Take 10 mg by mouth daily.      Marland Kitchen aspirin 325 MG tablet Take 325 mg by mouth daily.      . carvedilol (COREG) 6.25 MG tablet Take 6.25 mg by mouth 2 (two) times daily with a meal.      . clopidogrel (PLAVIX) 75 MG tablet Take 75 mg by mouth daily.      . furosemide (LASIX) 20 MG tablet 40 mg daily.      . hydrALAZINE (APRESOLINE) 50 MG tablet Take 1 tablet (50 mg total) by mouth 2 (two) times daily.  180 tablet  3  . HYDROcodone-acetaminophen (NORCO/VICODIN) 5-325 MG per tablet Take 1-2 tablets by mouth every 4 (four) hours as needed for pain.      . isosorbide mononitrate (IMDUR) 30 MG 24 hr tablet Take 30 mg by mouth daily.      Marland Kitchen oxyCODONE-acetaminophen (PERCOCET) 10-325 MG per tablet Take 1 tablet by mouth every 4 (four) hours as needed for pain.      . potassium chloride SA (K-DUR,KLOR-CON) 20 MEQ tablet Take 20 mEq by mouth 2 (two) times daily.      . simvastatin (ZOCOR) 10 MG tablet Take 10 mg by mouth daily.      Marland Kitchen terbinafine (LAMISIL) 250 MG tablet Take 250 mg by mouth daily.      . traMADol (ULTRAM) 50 MG tablet Take 50 mg by mouth as needed.      . traZODone (DESYREL) 150 MG tablet Take 150 mg by mouth at bedtime.      . Vitamin D, Ergocalciferol, (DRISDOL) 50000 UNITS CAPS Take 50,000 Units by  mouth every 7 (seven) days. Sunday       No current facility-administered medications for this visit.    No Known Allergies  History   Social History  . Marital Status: Divorced    Spouse Name: N/A    Number of Children: N/A  . Years of Education: N/A   Occupational History  . Not on file.   Social History Main Topics  . Smoking status: Former Smoker    Quit date: 04/18/1982  . Smokeless tobacco: Never Used  . Alcohol Use: No  . Drug Use: 7.00 per week    Special: Marijuana  . Sexual Activity: Not on file   Other Topics Concern  . Not on file   Social History Narrative  . No narrative on file     Review of Systems: General: negative for chills, fever, night sweats or weight changes.  Cardiovascular: negative for chest pain, dyspnea on exertion, edema, orthopnea, palpitations, paroxysmal nocturnal dyspnea or shortness of breath Dermatological: negative for rash Respiratory: negative for cough or wheezing Urologic: negative for hematuria Abdominal: negative for nausea, vomiting, diarrhea, bright red blood per rectum, melena, or hematemesis Neurologic: negative for visual changes,  syncope, or dizziness All other systems reviewed and are otherwise negative except as noted above.    Blood pressure 118/62, pulse 60, height 5' 8.5" (1.74 m), weight 167 lb (75.751 kg).  General appearance: alert and no distress Neck: no adenopathy, no carotid bruit, no JVD, supple, symmetrical, trachea midline and thyroid not enlarged, symmetric, no tenderness/mass/nodules Lungs: clear to auscultation bilaterally Heart: regular rate and rhythm, S1, S2 normal, no murmur, click, rub or gallop Extremities: extremities normal, atraumatic, no cyanosis or edema  EKG paced rhythm at a rate of 60  ASSESSMENT AND PLAN:   Cardiomyopathy, ischemic, EF 25-30% Yesterday 25-30% range. Past grafting x3 in 2006 with a LIMA to his LAD, vein to the circumflex and PDA. He also has a ICD placed 2/14  followed in the office by Dr. Precious Haws was hospitalized 3/23 through the 25th 2014 with congestive heart failure and was diuresed currently denies chest pain or shortness of breath. He still wears salt restriction.   S/P ICD (internal cardiac defibrillator) procedure, 04/18/12, Medtronic device ro ICM Followed by Dr. Sallyanne Kuster  who last saw him in the office on 06/10/12. He will be educated on CareLink remote      John Harp MD [place], [facility] 11/05/2012 12:13 PM

## 2012-11-05 NOTE — Assessment & Plan Note (Signed)
Yesterday 25-30% range. Past grafting x3 in 2006 with a LIMA to his LAD, vein to the circumflex and PDA. He also has a ICD placed 2/14 followed in the office by Dr. Precious Haws was hospitalized 3/23 through the 25th 2014 with congestive heart failure and was diuresed currently denies chest pain or shortness of breath. He still wears salt restriction.

## 2012-12-15 DIAGNOSIS — Z23 Encounter for immunization: Secondary | ICD-10-CM | POA: Diagnosis not present

## 2012-12-24 DIAGNOSIS — E1129 Type 2 diabetes mellitus with other diabetic kidney complication: Secondary | ICD-10-CM | POA: Diagnosis not present

## 2012-12-24 DIAGNOSIS — I259 Chronic ischemic heart disease, unspecified: Secondary | ICD-10-CM | POA: Diagnosis not present

## 2012-12-24 DIAGNOSIS — I509 Heart failure, unspecified: Secondary | ICD-10-CM | POA: Diagnosis not present

## 2012-12-24 DIAGNOSIS — I252 Old myocardial infarction: Secondary | ICD-10-CM | POA: Diagnosis not present

## 2012-12-24 DIAGNOSIS — I10 Essential (primary) hypertension: Secondary | ICD-10-CM | POA: Diagnosis not present

## 2012-12-25 ENCOUNTER — Ambulatory Visit (INDEPENDENT_AMBULATORY_CARE_PROVIDER_SITE_OTHER): Payer: Medicare Other

## 2012-12-25 DIAGNOSIS — Z9189 Other specified personal risk factors, not elsewhere classified: Secondary | ICD-10-CM

## 2012-12-25 DIAGNOSIS — Z789 Other specified health status: Secondary | ICD-10-CM | POA: Diagnosis not present

## 2012-12-25 DIAGNOSIS — I2589 Other forms of chronic ischemic heart disease: Secondary | ICD-10-CM | POA: Diagnosis not present

## 2012-12-25 DIAGNOSIS — I509 Heart failure, unspecified: Secondary | ICD-10-CM | POA: Diagnosis not present

## 2012-12-25 DIAGNOSIS — I5043 Acute on chronic combined systolic (congestive) and diastolic (congestive) heart failure: Secondary | ICD-10-CM | POA: Diagnosis not present

## 2012-12-25 DIAGNOSIS — I255 Ischemic cardiomyopathy: Secondary | ICD-10-CM

## 2012-12-25 LAB — ICD DEVICE OBSERVATION

## 2013-01-09 ENCOUNTER — Encounter: Payer: Self-pay | Admitting: *Deleted

## 2013-01-09 LAB — REMOTE ICD DEVICE
AL AMPLITUDE: 1.75 mv
AL IMPEDENCE ICD: 399 Ohm
AL THRESHOLD: 0.5 V
BATTERY VOLTAGE: 3.0384 V
CHARGE TIME: 4.053 s
DEV-0020ICD: NEGATIVE
HV IMPEDENCE: 62 Ohm
PACEART VT: 0
RV LEAD AMPLITUDE: 21.25 mv
RV LEAD IMPEDENCE ICD: 361 Ohm
TOT-0002: 0
TOT-0006: 20140207000000
TZAT-0001ATACH: 1
TZAT-0001ATACH: 2
TZAT-0001ATACH: 3
TZAT-0001SLOWVT: 1
TZAT-0002ATACH: NEGATIVE
TZAT-0002FASTVT: NEGATIVE
TZAT-0002SLOWVT: NEGATIVE
TZAT-0012ATACH: 150 ms
TZAT-0012ATACH: 150 ms
TZAT-0012ATACH: 150 ms
TZAT-0012FASTVT: 200 ms
TZAT-0012SLOWVT: 200 ms
TZAT-0018ATACH: NEGATIVE
TZAT-0018ATACH: NEGATIVE
TZAT-0018FASTVT: NEGATIVE
TZAT-0018SLOWVT: NEGATIVE
TZAT-0019ATACH: 6 V
TZAT-0019FASTVT: 8 V
TZAT-0020ATACH: 1.5 ms
TZAT-0020ATACH: 1.5 ms
TZON-0003ATACH: 350 ms
TZON-0003VSLOWVT: 370 ms
TZON-0004VSLOWVT: 40
TZST-0001ATACH: 4
TZST-0001ATACH: 6
TZST-0001FASTVT: 4
TZST-0001FASTVT: 5
TZST-0001FASTVT: 6
TZST-0001SLOWVT: 4
TZST-0001SLOWVT: 6
TZST-0002ATACH: NEGATIVE
TZST-0002FASTVT: NEGATIVE
TZST-0002FASTVT: NEGATIVE
TZST-0002SLOWVT: NEGATIVE
TZST-0002SLOWVT: NEGATIVE
TZST-0002SLOWVT: NEGATIVE

## 2013-01-12 DIAGNOSIS — M545 Low back pain, unspecified: Secondary | ICD-10-CM | POA: Diagnosis not present

## 2013-01-23 DIAGNOSIS — Z125 Encounter for screening for malignant neoplasm of prostate: Secondary | ICD-10-CM | POA: Diagnosis not present

## 2013-01-23 DIAGNOSIS — E1129 Type 2 diabetes mellitus with other diabetic kidney complication: Secondary | ICD-10-CM | POA: Diagnosis not present

## 2013-01-23 DIAGNOSIS — I509 Heart failure, unspecified: Secondary | ICD-10-CM | POA: Diagnosis not present

## 2013-01-23 DIAGNOSIS — Z0000 Encounter for general adult medical examination without abnormal findings: Secondary | ICD-10-CM | POA: Diagnosis not present

## 2013-01-23 DIAGNOSIS — E782 Mixed hyperlipidemia: Secondary | ICD-10-CM | POA: Diagnosis not present

## 2013-01-23 DIAGNOSIS — I2510 Atherosclerotic heart disease of native coronary artery without angina pectoris: Secondary | ICD-10-CM | POA: Diagnosis not present

## 2013-01-23 DIAGNOSIS — I252 Old myocardial infarction: Secondary | ICD-10-CM | POA: Diagnosis not present

## 2013-06-16 ENCOUNTER — Encounter: Payer: Self-pay | Admitting: *Deleted

## 2013-07-23 DIAGNOSIS — E782 Mixed hyperlipidemia: Secondary | ICD-10-CM | POA: Diagnosis not present

## 2013-07-23 DIAGNOSIS — B370 Candidal stomatitis: Secondary | ICD-10-CM | POA: Diagnosis not present

## 2013-07-23 DIAGNOSIS — I10 Essential (primary) hypertension: Secondary | ICD-10-CM | POA: Diagnosis not present

## 2013-07-23 DIAGNOSIS — E1129 Type 2 diabetes mellitus with other diabetic kidney complication: Secondary | ICD-10-CM | POA: Diagnosis not present

## 2013-07-23 DIAGNOSIS — N183 Chronic kidney disease, stage 3 unspecified: Secondary | ICD-10-CM | POA: Diagnosis not present

## 2013-07-23 DIAGNOSIS — I252 Old myocardial infarction: Secondary | ICD-10-CM | POA: Diagnosis not present

## 2013-07-24 ENCOUNTER — Ambulatory Visit (INDEPENDENT_AMBULATORY_CARE_PROVIDER_SITE_OTHER): Payer: Medicare Other | Admitting: Cardiovascular Disease

## 2013-07-24 ENCOUNTER — Encounter: Payer: Self-pay | Admitting: Cardiovascular Disease

## 2013-07-24 VITALS — BP 134/64 | HR 70 | Resp 16 | Ht 67.0 in | Wt 160.0 lb

## 2013-07-24 DIAGNOSIS — Z95810 Presence of automatic (implantable) cardiac defibrillator: Secondary | ICD-10-CM

## 2013-07-24 DIAGNOSIS — Z789 Other specified health status: Secondary | ICD-10-CM | POA: Diagnosis not present

## 2013-07-24 DIAGNOSIS — I2510 Atherosclerotic heart disease of native coronary artery without angina pectoris: Secondary | ICD-10-CM | POA: Diagnosis not present

## 2013-07-24 DIAGNOSIS — I509 Heart failure, unspecified: Secondary | ICD-10-CM

## 2013-07-24 DIAGNOSIS — I2589 Other forms of chronic ischemic heart disease: Secondary | ICD-10-CM

## 2013-07-24 DIAGNOSIS — E782 Mixed hyperlipidemia: Secondary | ICD-10-CM | POA: Diagnosis not present

## 2013-07-24 DIAGNOSIS — Z79899 Other long term (current) drug therapy: Secondary | ICD-10-CM

## 2013-07-24 DIAGNOSIS — Z9189 Other specified personal risk factors, not elsewhere classified: Secondary | ICD-10-CM

## 2013-07-24 DIAGNOSIS — I255 Ischemic cardiomyopathy: Secondary | ICD-10-CM

## 2013-07-24 LAB — PACEMAKER DEVICE OBSERVATION

## 2013-07-24 NOTE — Patient Instructions (Signed)
Your physician recommends that you return for lab work in: FASTING at [facility] - no appointment necessary just take the paperwork with you.  Remote monitoring is used to monitor your Pacemaker or ICD from home. This monitoring reduces the number of office visits required to check your device to one time per year. It allows [place] to monitor the functioning of your device to ensure it is working properly. You are scheduled for a device check from home on October 24, 2013. You may send your transmission at any time that day. If you have a wireless device, the transmission will be sent automatically. After your physician reviews your transmission, you will receive a postcard with your next transmission date.  Dr. Sallyanne Kuster recommends that you schedule a follow-up appointment in: ONE YEAR.

## 2013-07-25 ENCOUNTER — Encounter: Payer: Self-pay | Admitting: Cardiovascular Disease

## 2013-07-25 LAB — MDC_IDC_ENUM_SESS_TYPE_INCLINIC
Brady Statistic AP VS Percent: 37.4 %
Brady Statistic AS VP Percent: 0.1 % — CL
HighPow Impedance: 57 Ohm
Lead Channel Impedance Value: 399 Ohm
Lead Channel Impedance Value: 418 Ohm
Lead Channel Pacing Threshold Pulse Width: 0.4 ms
Lead Channel Pacing Threshold Pulse Width: 0.4 ms
Lead Channel Sensing Intrinsic Amplitude: 18.1 mV
Lead Channel Setting Pacing Pulse Width: 0.4 ms
MDC IDC MSMT LEADCHNL RA PACING THRESHOLD AMPLITUDE: 0.75 V
MDC IDC MSMT LEADCHNL RA SENSING INTR AMPL: 1.8 mV
MDC IDC MSMT LEADCHNL RV PACING THRESHOLD AMPLITUDE: 0.75 V
MDC IDC SET LEADCHNL RA PACING AMPLITUDE: 1.5 V
MDC IDC SET LEADCHNL RV PACING AMPLITUDE: 2 V
MDC IDC SET LEADCHNL RV SENSING SENSITIVITY: 0.3 mV
MDC IDC SET ZONE DETECTION INTERVAL: 350 ms
MDC IDC SET ZONE DETECTION INTERVAL: 360 ms
MDC IDC STAT BRADY AP VP PERCENT: 0.1 % — AB
MDC IDC STAT BRADY AS VS PERCENT: 62.6 %
Zone Setting Detection Interval: 300 ms
Zone Setting Detection Interval: 370 ms

## 2013-07-25 NOTE — Assessment & Plan Note (Signed)
Currently asymptomatic and  well treated risk factors.
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
## 2013-07-25 NOTE — Assessment & Plan Note (Signed)
Flow interrogation shows normal device function. Only one episode of nonsustained VT recorded consisting of 6 beats and bracketed by very frequent PVCs and ventricular couplets. Device longevity estimated almost 10 years. 37% atrial pacing and virtually no ventricular pacing. No permanent programming changes made.

## 2013-07-25 NOTE — Assessment & Plan Note (Signed)
Systolic and diastolic, chronic, NYHA functional class I. Appears clinically euvolemic and this is confirmed by his thoracic impedance reading. If he develops more prominent symptoms of heart failure in the future there is an option for upgrade to CRT-D device. He is on appropriate vasodilator and beta blocker therapy.

## 2013-07-29 DIAGNOSIS — E1129 Type 2 diabetes mellitus with other diabetic kidney complication: Secondary | ICD-10-CM | POA: Diagnosis not present

## 2013-08-21 ENCOUNTER — Emergency Department (HOSPITAL_COMMUNITY): Payer: Medicare Other

## 2013-08-21 ENCOUNTER — Encounter (HOSPITAL_COMMUNITY): Payer: Self-pay | Admitting: Emergency Medicine

## 2013-08-21 ENCOUNTER — Emergency Department (HOSPITAL_COMMUNITY)
Admission: EM | Admit: 2013-08-21 | Discharge: 2013-08-21 | Disposition: A | Discharge status: Home / Self Care | Payer: Medicare Other | Attending: Emergency Medicine | Admitting: Emergency Medicine

## 2013-08-21 DIAGNOSIS — Z95810 Presence of automatic (implantable) cardiac defibrillator: Secondary | ICD-10-CM | POA: Diagnosis not present

## 2013-08-21 DIAGNOSIS — D3709 Neoplasm of uncertain behavior of other specified sites of the oral cavity: Secondary | ICD-10-CM | POA: Diagnosis not present

## 2013-08-21 DIAGNOSIS — Z87891 Personal history of nicotine dependence: Secondary | ICD-10-CM | POA: Diagnosis not present

## 2013-08-21 DIAGNOSIS — Z7982 Long term (current) use of aspirin: Secondary | ICD-10-CM | POA: Diagnosis not present

## 2013-08-21 DIAGNOSIS — G529 Cranial nerve disorder, unspecified: Secondary | ICD-10-CM | POA: Diagnosis not present

## 2013-08-21 DIAGNOSIS — Z8709 Personal history of other diseases of the respiratory system: Secondary | ICD-10-CM | POA: Diagnosis not present

## 2013-08-21 DIAGNOSIS — Z9889 Other specified postprocedural states: Secondary | ICD-10-CM | POA: Insufficient documentation

## 2013-08-21 DIAGNOSIS — E119 Type 2 diabetes mellitus without complications: Secondary | ICD-10-CM | POA: Insufficient documentation

## 2013-08-21 DIAGNOSIS — J029 Acute pharyngitis, unspecified: Secondary | ICD-10-CM | POA: Diagnosis not present

## 2013-08-21 DIAGNOSIS — Z7902 Long term (current) use of antithrombotics/antiplatelets: Secondary | ICD-10-CM | POA: Diagnosis not present

## 2013-08-21 DIAGNOSIS — I2510 Atherosclerotic heart disease of native coronary artery without angina pectoris: Secondary | ICD-10-CM | POA: Insufficient documentation

## 2013-08-21 DIAGNOSIS — I5042 Chronic combined systolic (congestive) and diastolic (congestive) heart failure: Secondary | ICD-10-CM | POA: Insufficient documentation

## 2013-08-21 DIAGNOSIS — R1310 Dysphagia, unspecified: Secondary | ICD-10-CM | POA: Diagnosis not present

## 2013-08-21 DIAGNOSIS — I129 Hypertensive chronic kidney disease with stage 1 through stage 4 chronic kidney disease, or unspecified chronic kidney disease: Secondary | ICD-10-CM | POA: Diagnosis not present

## 2013-08-21 DIAGNOSIS — Z951 Presence of aortocoronary bypass graft: Secondary | ICD-10-CM | POA: Diagnosis not present

## 2013-08-21 DIAGNOSIS — N183 Chronic kidney disease, stage 3 unspecified: Secondary | ICD-10-CM | POA: Insufficient documentation

## 2013-08-21 DIAGNOSIS — I10 Essential (primary) hypertension: Secondary | ICD-10-CM | POA: Diagnosis not present

## 2013-08-21 DIAGNOSIS — I252 Old myocardial infarction: Secondary | ICD-10-CM | POA: Insufficient documentation

## 2013-08-21 DIAGNOSIS — Z79899 Other long term (current) drug therapy: Secondary | ICD-10-CM | POA: Insufficient documentation

## 2013-08-21 DIAGNOSIS — M542 Cervicalgia: Secondary | ICD-10-CM | POA: Diagnosis not present

## 2013-08-21 DIAGNOSIS — D3701 Neoplasm of uncertain behavior of lip: Secondary | ICD-10-CM | POA: Diagnosis not present

## 2013-08-21 LAB — I-STAT CHEM 8, ED
BUN: 22 mg/dL (ref 6–23)
Calcium, Ion: 1.3 mmol/L (ref 1.13–1.30)
Chloride: 110 mEq/L (ref 96–112)
Creatinine, Ser: 1.8 mg/dL — ABNORMAL HIGH (ref 0.50–1.35)
Glucose, Bld: 126 mg/dL — ABNORMAL HIGH (ref 70–99)
HEMATOCRIT: 32 % — AB (ref 39.0–52.0)
HEMOGLOBIN: 10.9 g/dL — AB (ref 13.0–17.0)
Potassium: 3.8 mEq/L (ref 3.7–5.3)
SODIUM: 143 meq/L (ref 137–147)
TCO2: 22 mmol/L (ref 0–100)

## 2013-08-21 LAB — RAPID STREP SCREEN (MED CTR MEBANE ONLY): STREPTOCOCCUS, GROUP A SCREEN (DIRECT): NEGATIVE

## 2013-08-21 MED ORDER — SODIUM CHLORIDE 0.9 % IV BOLUS (SEPSIS)
1000.0000 mL | Freq: Once | INTRAVENOUS | Status: AC
  Administered 2013-08-21: 1000 mL via INTRAVENOUS

## 2013-08-21 MED ORDER — IOHEXOL 300 MG/ML  SOLN
75.0000 mL | Freq: Once | INTRAMUSCULAR | Status: AC | PRN
  Administered 2013-08-21: 75 mL via INTRAVENOUS

## 2013-08-21 MED ORDER — MORPHINE SULFATE 4 MG/ML IJ SOLN
4.0000 mg | Freq: Once | INTRAMUSCULAR | Status: AC
  Administered 2013-08-21: 4 mg via INTRAVENOUS
  Filled 2013-08-21: qty 1

## 2013-08-21 MED ORDER — HYDROCODONE-ACETAMINOPHEN 7.5-325 MG/15ML PO SOLN: 15.0000 mL | Freq: Four times a day (QID) | ORAL | Status: DC | PRN

## 2013-08-21 MED ORDER — ONDANSETRON HCL 4 MG/2ML IJ SOLN
4.0000 mg | Freq: Once | INTRAMUSCULAR | Status: AC
  Administered 2013-08-21: 4 mg via INTRAVENOUS
  Filled 2013-08-21: qty 2

## 2013-08-21 NOTE — ED Notes (Signed)
Pt brought back to room via wheelchair; pt getting undressed and into a gown at this time; Amy, NT (nurse intern) present in room
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
## 2013-08-21 NOTE — Discharge Instructions (Signed)
You will need to follow closely with you and [place] and throat doctor. Please call today to make an appointment. If you're unable to make this appointment, followup with her primary care physician for followup. His incredibly important that you followup on your symptoms with the ear nose and throat physician. If you develop inability to swallow well along with inability to tolerate her own saliva he will need to return to the emergency department. If you develop difficulty breathing 911.  Sore Throat A sore throat is pain, burning, irritation, or scratchiness of the throat. There is often pain or tenderness when swallowing or talking. A sore throat may be accompanied by other symptoms, such as coughing, sneezing, fever, and swollen neck glands. A sore throat is often the first sign of another sickness, such as a cold, flu, strep throat, or mononucleosis (commonly known as mono). Most sore throats go away without medical treatment. CAUSES  The most common causes of a sore throat include:  A viral infection, such as a cold, flu, or mono.  A bacterial infection, such as strep throat, tonsillitis, or whooping cough.  Seasonal allergies.  Dryness in the air.  Irritants, such as smoke or pollution.  Gastroesophageal reflux disease (GERD). HOME CARE INSTRUCTIONS   Only take over-the-counter medicines as directed by your caregiver.  Drink enough fluids to keep your urine clear or pale yellow.  Rest as needed.  Try using throat sprays, lozenges, or sucking on hard candy to ease any pain (if older than 4 years or as directed).  Sip warm liquids, such as broth, herbal tea, or warm water with honey to relieve pain temporarily. You may also eat or drink cold or frozen liquids such as frozen ice pops.  Gargle with salt water (mix 1 tsp salt with 8 oz of water).  Do not smoke and avoid secondhand smoke.  Put a cool-mist humidifier in your bedroom at night to moisten the air. You can also turn on a hot  shower and sit in the bathroom with the door closed for 5 10 minutes. SEEK IMMEDIATE MEDICAL CARE IF:  You have difficulty breathing.  You are unable to swallow fluids, soft foods, or your saliva.  You have increased swelling in the throat.  Your sore throat does not get better in 7 days.  You have nausea and vomiting.  You have a fever or persistent symptoms for more than 2 3 days.  You have a fever and your symptoms suddenly get worse. MAKE SURE YOU:   Understand these instructions.  Will watch your condition.  Will get help right away if you are not doing well or get worse. Document Released: 04/05/2004 Document Revised: 02/13/2012 Document Reviewed: 11/04/2011 [facility] Patient Information 2014 [place], [place].

## 2013-08-21 NOTE — ED Provider Notes (Signed)
Medical screening examination/treatment/procedure(s) were conducted as a shared visit with non-physician practitioner(s) and myself.  I personally evaluated the patient during the encounter.   EKG Interpretation None      Pt is a 68 y.o. M with history of diabetes, CHF who presents emergency department with several days of left-sided neck pain. He states he has pain with swallowing but no trismus or drooling, no difficulty breathing or speaking, no uvular deviation or tonsillar hypertrophy, no fever. On exam, patient is not having any difficulty breathing, hypoxia, respiratory distress. There is no drooling and he is able to swallow his secretions without difficulty. He states his finger in the back of his posterior oropharynx and states that he feels something abnormal there. He does have one slightly enlarged lymph node in the left submandibular area. No dental caries or dental pain. No signs of Ludwig angina. On CT scan, patient has a left tonsillar mass that may represent carcinoma. We'll discharge patient with close PCP and ENT followup. There is no sign of infection or edema on exam. He is hemodynamically stable. Strep test negative.  [place], DO 08/21/13 1323

## 2013-08-21 NOTE — ED Notes (Signed)
Pt. Reports having lt. Side of his throat is sore and he is having difficulty swallowing due to pain He states , "I think there is tumor in there" Pt. Reports that he can feel something on his throat. Denies any n/v.  Pt. Is alert and oriented X3.  Speech is clear, no neuro deficits noted

## 2013-08-22 ENCOUNTER — Telehealth: Payer: Self-pay | Admitting: Cardiovascular Disease

## 2013-08-22 ENCOUNTER — Encounter: Payer: Self-pay | Admitting: Cardiovascular Disease

## 2013-08-23 LAB — CULTURE, GROUP A STREP

## 2013-08-24 ENCOUNTER — Other Ambulatory Visit (HOSPITAL_COMMUNITY): Payer: Self-pay | Admitting: Otolaryngology

## 2013-08-24 ENCOUNTER — Ambulatory Visit (HOSPITAL_COMMUNITY)
Admission: RE | Admit: 2013-08-24 | Discharge: 2013-08-24 | Disposition: A | Discharge status: Home / Self Care | Payer: Medicare Other | Source: Ambulatory Visit | Attending: Otolaryngology | Admitting: Otolaryngology

## 2013-08-24 DIAGNOSIS — J351 Hypertrophy of tonsils: Secondary | ICD-10-CM

## 2013-08-24 DIAGNOSIS — R1310 Dysphagia, unspecified: Secondary | ICD-10-CM | POA: Diagnosis not present

## 2013-08-24 DIAGNOSIS — K224 Dyskinesia of esophagus: Secondary | ICD-10-CM | POA: Diagnosis not present

## 2013-08-24 NOTE — Telephone Encounter (Signed)
Closed encounter °

## 2013-08-28 ENCOUNTER — Telehealth: Payer: Self-pay | Admitting: *Deleted

## 2013-08-28 DIAGNOSIS — I2510 Atherosclerotic heart disease of native coronary artery without angina pectoris: Secondary | ICD-10-CM

## 2013-08-28 DIAGNOSIS — Z01818 Encounter for other preprocedural examination: Secondary | ICD-10-CM

## 2013-08-28 NOTE — Telephone Encounter (Signed)
Dr Erik Obey wants surgical clearance for surgery on Mr [name].  Dr Gwenlyn Found advised that patient needs a lexiscan myoview and carotid dopplers before clearance can be given.    Lm with instruction for patient

## 2013-09-02 ENCOUNTER — Ambulatory Visit (HOSPITAL_COMMUNITY)
Admission: RE | Admit: 2013-09-02 | Discharge: 2013-09-02 | Disposition: A | Discharge status: Home / Self Care | Payer: Medicare Other | Source: Ambulatory Visit | Attending: Cardiology | Admitting: Cardiology

## 2013-09-02 ENCOUNTER — Ambulatory Visit (HOSPITAL_BASED_OUTPATIENT_CLINIC_OR_DEPARTMENT_OTHER)
Admission: RE | Admit: 2013-09-02 | Discharge: 2013-09-02 | Disposition: A | Discharge status: Home / Self Care | Payer: Medicare Other | Source: Ambulatory Visit | Attending: Cardiology | Admitting: Cardiology

## 2013-09-02 DIAGNOSIS — I2510 Atherosclerotic heart disease of native coronary artery without angina pectoris: Secondary | ICD-10-CM

## 2013-09-02 DIAGNOSIS — Z01810 Encounter for preprocedural cardiovascular examination: Secondary | ICD-10-CM

## 2013-09-02 DIAGNOSIS — I2589 Other forms of chronic ischemic heart disease: Secondary | ICD-10-CM | POA: Insufficient documentation

## 2013-09-02 DIAGNOSIS — Z01818 Encounter for other preprocedural examination: Secondary | ICD-10-CM

## 2013-09-02 DIAGNOSIS — Z95810 Presence of automatic (implantable) cardiac defibrillator: Secondary | ICD-10-CM | POA: Insufficient documentation

## 2013-09-02 DIAGNOSIS — Z951 Presence of aortocoronary bypass graft: Secondary | ICD-10-CM | POA: Insufficient documentation

## 2013-09-02 DIAGNOSIS — I252 Old myocardial infarction: Secondary | ICD-10-CM | POA: Diagnosis not present

## 2013-09-02 DIAGNOSIS — R9431 Abnormal electrocardiogram [ECG] [EKG]: Secondary | ICD-10-CM | POA: Insufficient documentation

## 2013-09-02 DIAGNOSIS — Z9861 Coronary angioplasty status: Secondary | ICD-10-CM | POA: Diagnosis not present

## 2013-09-02 DIAGNOSIS — J4489 Other specified chronic obstructive pulmonary disease: Secondary | ICD-10-CM | POA: Insufficient documentation

## 2013-09-02 DIAGNOSIS — J449 Chronic obstructive pulmonary disease, unspecified: Secondary | ICD-10-CM | POA: Diagnosis not present

## 2013-09-02 MED ORDER — TECHNETIUM TC 99M SESTAMIBI GENERIC - CARDIOLITE
10.8000 | Freq: Once | INTRAVENOUS | Status: AC | PRN
  Administered 2013-09-02: 11 via INTRAVENOUS

## 2013-09-02 MED ORDER — REGADENOSON 0.4 MG/5ML IV SOLN
0.4000 mg | Freq: Once | INTRAVENOUS | Status: AC
  Administered 2013-09-02: 0.4 mg via INTRAVENOUS

## 2013-09-02 MED ORDER — AMINOPHYLLINE 25 MG/ML IV SOLN
75.0000 mg | Freq: Once | INTRAVENOUS | Status: AC
  Administered 2013-09-02: 75 mg via INTRAVENOUS

## 2013-09-02 MED ORDER — TECHNETIUM TC 99M SESTAMIBI GENERIC - CARDIOLITE
31.1000 | Freq: Once | INTRAVENOUS | Status: AC | PRN
  Administered 2013-09-02: 31.1 via INTRAVENOUS

## 2013-09-02 NOTE — Procedures (Addendum)
[facility] [address] [place] [id number]  Cardiology Nuclear Med Study  [name] is a 68 y.o. male     MRN : [id number]     DOB: 02/03/1946  Procedure Date: 09/02/2013  Nuclear Med Background Indication for Stress Test:  Surgical Clearance and Abnormal EKG History:  COPD and CAD;MI;CABG X3-2006;STENT/PTCA;AICD-04/2012;ischemic cardiomyopathy;HIGH RISK for sudden cardiac death;HX of cocaine and marijuna use/abuse;CHF;acute respiratory failure;Last NUC MPI on 11/15/2009-nonischemic;EF=34% Cardiac Risk Factors: History of Smoking, Hypertension, LBBB, Lipids and NIDDM  Symptoms:  DOE   Nuclear Pre-Procedure Caffeine/Decaff Intake:  1:00am NPO After: 11am   IV Site: R Forearm  IV 0.9% NS with Angio Cath:  22g  Chest Size (in):  42"  IV Started by: Rolene Course, RN  Height: 5\' 9"  (1.753 m)  Cup Size: n/a  BMI:  Body mass index is 23.62 kg/(m^2). Weight:  160 lb (72.576 kg)   Tech Comments:  n/a    Nuclear Med Study 1 or 2 day study: 1 day  Stress Test Type:  [place] Provider:  Quay Burow, MD   Resting Radionuclide: Technetium 40m Sestamibi  Resting Radionuclide Dose: 10.8 mCi   Stress Radionuclide:  Technetium 59m Sestamibi  Stress Radionuclide Dose: 31.1 mCi           Stress Protocol Rest HR: 61 Stress HR:  78  Rest BP: 175/87 Stress BP: 153/68  Exercise Time (min): n/a METS: n/a   Predicted Max HR: 154 bpm % Max HR: 56.49 bpm Rate Pressure Product: 15225  Dose of Adenosine (mg):  n/a Dose of Lexiscan: 0.4 mg  Dose of Atropine (mg): n/a Dose of Dobutamine: n/a mcg/kg/min (at max HR)  Stress Test Technologist: Leane Para, CCT Nuclear Technologist: Imagene Riches, CNMT   Rest Procedure:  Myocardial perfusion imaging was performed at rest 45 minutes following the intravenous administration of Technetium 53m Sestamibi. Stress Procedure:  The patient received IV  Lexiscan 0.4 mg over 15-seconds.  Technetium 39m Sestamibi injected IV at 30-seconds.  Patient experienced marked SOB and 75 mg of Aminophylline IV was administered. There were no significant changes with Lexiscan.  Quantitative spect images were obtained after a 45 minute delay.  Transient Ischemic Dilatation (Normal <1.22):  1.19  QGS EDV:  185 ml QGS ESV:  112 ml LV Ejection Fraction: 40%    Rest ECG: NSR, LBBB with repolarization, PVC  Stress ECG: No significant change from baseline ECG and There are scattered PVCs.  QPS Raw Data Images:  Patient motion noted. Stress Images:  Normal homogeneous uptake in all areas of the myocardium. Rest Images:  Inferior basal attenuation;otherwise, normal homogeneous uptake in all areas of the myocardium. Subtraction (SDS):  No evidence of ischemia.  Impression Exercise Capacity:  Lexiscan with no exercise. BP Response:  Normal blood pressure response. Clinical Symptoms:  Mild shortnes of breath ECG Impression:  Baseline:  LBBB.  EKG uninterpretable due to LBBB at rest and stress. Comparison with Prior Nuclear Study: Since prior study inferior perfusion is improved and EF increased from 34% to 40%  Overall Impression:  Low risk stress nuclear study normal perfusion without ischemia with EF 40% improved from 34%..  LV Wall Motion: Mild-moderately reduced LV Function, EF 40%; Mild apical septal hypokinesis may be contributed by conduction abnormality.   John Sine, MD  09/02/2013 4:50 PM

## 2013-09-02 NOTE — Progress Notes (Signed)
Carotid Duplex Completed. °Brianna L Mazza,RVT °

## 2013-09-08 ENCOUNTER — Ambulatory Visit (INDEPENDENT_AMBULATORY_CARE_PROVIDER_SITE_OTHER): Payer: Medicare Other | Admitting: Cardiovascular Disease

## 2013-09-08 ENCOUNTER — Encounter: Payer: Self-pay | Admitting: Cardiovascular Disease

## 2013-09-08 VITALS — BP 149/61 | HR 64 | Ht 68.0 in | Wt 165.1 lb

## 2013-09-08 DIAGNOSIS — I6529 Occlusion and stenosis of unspecified carotid artery: Secondary | ICD-10-CM

## 2013-09-08 DIAGNOSIS — I10 Essential (primary) hypertension: Secondary | ICD-10-CM

## 2013-09-08 DIAGNOSIS — I739 Peripheral vascular disease, unspecified: Secondary | ICD-10-CM

## 2013-09-08 DIAGNOSIS — I2589 Other forms of chronic ischemic heart disease: Secondary | ICD-10-CM

## 2013-09-08 DIAGNOSIS — I779 Disorder of arteries and arterioles, unspecified: Secondary | ICD-10-CM | POA: Insufficient documentation

## 2013-09-08 DIAGNOSIS — I2510 Atherosclerotic heart disease of native coronary artery without angina pectoris: Secondary | ICD-10-CM | POA: Diagnosis not present

## 2013-09-08 DIAGNOSIS — E785 Hyperlipidemia, unspecified: Secondary | ICD-10-CM

## 2013-09-08 DIAGNOSIS — E7800 Pure hypercholesterolemia, unspecified: Secondary | ICD-10-CM | POA: Insufficient documentation

## 2013-09-08 DIAGNOSIS — I255 Ischemic cardiomyopathy: Secondary | ICD-10-CM

## 2013-09-08 DIAGNOSIS — I6521 Occlusion and stenosis of right carotid artery: Secondary | ICD-10-CM

## 2013-09-08 NOTE — Assessment & Plan Note (Signed)
On statin therapy. We will recheck a lipid and liver profile 

## 2013-09-08 NOTE — Assessment & Plan Note (Signed)
Recent carotid Doppler study performed 09/02/13 revealed a high-grade right ICA stenosis, mild to moderate left. Neurologically asymptomatic. He does have ischemic myopathy with an EF in the 25% range status post bypass grafting remotely. He has a ICD in place and a low risk nuclear stress test. I believe he'll need carotid artery revascularization. He probably is high risk for endarterectomy and may be a better candidate for carotid artery stenting. I'm referring him to Dr. Annamarie Major  for further evaluation.

## 2013-09-08 NOTE — Patient Instructions (Signed)
Your physician wants you to follow-up in: 6 months with Dr Gwenlyn Found. You will receive a reminder letter in the mail two months in advance. If you don't receive a letter, please call our office to schedule the follow-up appointment.  Dr Gwenlyn Found has referred you to Dr Trula Slade for your carotid artery disease.

## 2013-09-08 NOTE — Assessment & Plan Note (Signed)
Controlled on current medications 

## 2013-09-08 NOTE — Assessment & Plan Note (Signed)
History of remote coronary artery bypass grafting in 19 2006 with LIMA to his LAD, vein to the circumflex and PDA. He has ischemic cardiomyopathy with EF 25%. His ICD and father Dr. Sallyanne Kuster. He denies chest pain or shortness of breath. Recent Myoview was low-risk without ischemia.

## 2013-09-08 NOTE — Progress Notes (Signed)
09/08/2013 [name]   [birth year]  [id number]  Primary Physician Wenda Low, MD Primary Cardiologist: Lorretta Harp MD Renae Gloss   HPI:  Mr. Guzek is a 24 show thin appearing African American male history of CAD status post bypass grafting x3 in 2006 with a LIMA to his LAD, vein to circumflex and PDA. His other problems include COPD with tobacco abuse, diabetes, hypertension, left bundle branch block and chronic kidney disease. He had an IV ICD placed in February 2014 followup by Dr. Sallyanne Kuster in our office. He denies chest pain or shortness of breath.recent carotid Doppler suggested a high-grade right internal carotid artery stenosis. Myoview stress test performed 09/02/13 showed no ischemia. He apparently is scheduled for upcoming surgical procedure which is certainly at high risk for given his carotid disease and his LV dysfunction. I am going to refer him to Dr.Brabham  for further evaluation of his carotid artery stenosis. He may be a better carotid artery stent candidate.    Current Outpatient Prescriptions  Medication Sig Dispense Refill  . amLODipine (NORVASC) 10 MG tablet Take 10 mg by mouth daily.      Marland Kitchen aspirin 325 MG tablet Take 325 mg by mouth daily.      Marland Kitchen atorvastatin (LIPITOR) 40 MG tablet Take 40 mg by mouth daily.       . carvedilol (COREG) 6.25 MG tablet Take 6.25 mg by mouth 2 (two) times daily with a meal.      . clopidogrel (PLAVIX) 75 MG tablet Take 75 mg by mouth daily.      . furosemide (LASIX) 20 MG tablet Take 40 mg by mouth daily.       . hydrALAZINE (APRESOLINE) 50 MG tablet Take 1 tablet (50 mg total) by mouth 2 (two) times daily.  180 tablet  3  . HYDROcodone-acetaminophen (HYCET) 7.5-325 mg/15 ml solution Take 15 mLs by mouth 4 (four) times daily as needed for moderate pain.  120 mL  0  . isosorbide mononitrate (IMDUR) 30 MG 24 hr tablet Take 30 mg by mouth daily.      Marland Kitchen oxyCODONE-acetaminophen (PERCOCET) 10-325 MG per tablet Take 1  tablet by mouth every 8 (eight) hours as needed for pain.       . potassium chloride SA (K-DUR,KLOR-CON) 20 MEQ tablet Take 20 mEq by mouth 2 (two) times daily.      . traZODone (DESYREL) 150 MG tablet Take 150 mg by mouth at bedtime.       No current facility-administered medications for this visit.    No Known Allergies  History   Social History  . Marital Status: Divorced    Spouse Name: N/A    Number of Children: N/A  . Years of Education: N/A   Occupational History  . Not on file.   Social History Main Topics  . Smoking status: Former Smoker    Quit date: 04/18/1982  . Smokeless tobacco: Never Used  . Alcohol Use: No  . Drug Use: 7.00 per week    Special: Marijuana  . Sexual Activity: Not on file   Other Topics Concern  . Not on file   Social History Narrative  . No narrative on file     Review of Systems: General: negative for chills, fever, night sweats or weight changes.  Cardiovascular: negative for chest pain, dyspnea on exertion, edema, orthopnea, palpitations, paroxysmal nocturnal dyspnea or shortness of breath Dermatological: negative for rash Respiratory: negative for cough or wheezing Urologic: negative for hematuria  Abdominal: negative for nausea, vomiting, diarrhea, bright red blood per rectum, melena, or hematemesis Neurologic: negative for visual changes, syncope, or dizziness All other systems reviewed and are otherwise negative except as noted above.    Blood pressure 149/61, pulse 64, height 5\' 8"  (1.727 m), weight 165 lb 1.6 oz (74.889 kg).  General appearance: alert and no distress Neck: no adenopathy, no JVD, supple, symmetrical, trachea midline, thyroid not enlarged, symmetric, no tenderness/mass/nodules and bilateral carotid bruits right louder than left Lungs: clear to auscultation bilaterally Heart: regular rate and rhythm, S1, S2 normal, no murmur, click, rub or gallop Extremities: extremities normal, atraumatic, no cyanosis or  edema  EKG not performed today  ASSESSMENT AND PLAN:   Carotid artery disease Recent carotid Doppler study performed 09/02/13 revealed a high-grade right ICA stenosis, mild to moderate left. Neurologically asymptomatic. He does have ischemic myopathy with an EF in the 25% range status post bypass grafting remotely. He has a ICD in place and a low risk nuclear stress test. I believe he'll need carotid artery revascularization. He probably is high risk for endarterectomy and may be a better candidate for carotid artery stenting. I'm referring him to Dr. Annamarie Major  for further evaluation.  Cardiomyopathy, ischemic, EF 25-30% History of remote coronary artery bypass grafting in 19 2006 with LIMA to his LAD, vein to the circumflex and PDA. He has ischemic cardiomyopathy with EF 25%. His ICD and father Dr. Sallyanne Kuster. He denies chest pain or shortness of breath. Recent Myoview was low-risk without ischemia.  Essential hypertension Controlled on current medications  Hyperlipidemia On statin therapy. We will recheck a lipid and liver profile      Lorretta Harp MD [facility], [facility] 09/08/2013 12:22 PM

## 2013-09-09 ENCOUNTER — Encounter (HOSPITAL_COMMUNITY): Payer: Self-pay | Admitting: Pharmacy Technician

## 2013-09-10 ENCOUNTER — Encounter (HOSPITAL_COMMUNITY): Payer: Self-pay | Admitting: *Deleted

## 2013-09-10 ENCOUNTER — Telehealth: Payer: Self-pay | Admitting: Cardiovascular Disease

## 2013-09-10 ENCOUNTER — Encounter (HOSPITAL_COMMUNITY)
Admission: RE | Admit: 2013-09-10 | Discharge: 2013-09-10 | Disposition: A | Discharge status: Home / Self Care | Payer: Medicare Other | Source: Ambulatory Visit | Attending: Otolaryngology | Admitting: Otolaryngology

## 2013-09-10 LAB — COMPREHENSIVE METABOLIC PANEL
ALT: 15 U/L (ref 0–53)
AST: 16 U/L (ref 0–37)
Albumin: 3.9 g/dL (ref 3.5–5.2)
Alkaline Phosphatase: 143 U/L — ABNORMAL HIGH (ref 39–117)
Anion gap: 14 (ref 5–15)
BUN: 32 mg/dL — ABNORMAL HIGH (ref 6–23)
CALCIUM: 9.6 mg/dL (ref 8.4–10.5)
CO2: 22 meq/L (ref 19–32)
Chloride: 107 mEq/L (ref 96–112)
Creatinine, Ser: 1.97 mg/dL — ABNORMAL HIGH (ref 0.50–1.35)
GFR calc Af Amer: 39 mL/min — ABNORMAL LOW (ref >90)
GFR calc non Af Amer: 34 mL/min — ABNORMAL LOW (ref >90)
Glucose, Bld: 119 mg/dL — ABNORMAL HIGH (ref 70–99)
Potassium: 4.7 mEq/L (ref 3.7–5.3)
SODIUM: 143 meq/L (ref 137–147)
Total Bilirubin: 0.4 mg/dL (ref 0.3–1.2)
Total Protein: 7.3 g/dL (ref 6.0–8.3)

## 2013-09-10 LAB — CBC
HCT: 31.2 % — ABNORMAL LOW (ref 39.0–52.0)
Hemoglobin: 10.2 g/dL — ABNORMAL LOW (ref 13.0–17.0)
MCH: 30.6 pg (ref 26.0–34.0)
MCHC: 32.7 g/dL (ref 30.0–36.0)
MCV: 93.7 fL (ref 78.0–100.0)
PLATELETS: 130 10*3/uL — AB (ref 150–400)
RBC: 3.33 MIL/uL — AB (ref 4.22–5.81)
RDW: 14.4 % (ref 11.5–15.5)
WBC: 3.5 10*3/uL — AB (ref 4.0–10.5)

## 2013-09-10 NOTE — Telephone Encounter (Signed)
Notified Dr. Gwenlyn Found is not clearing him for surgery due to his severe carotid disease.  She did not receive the fax this AM.  Will refax to [phone].

## 2013-09-10 NOTE — Telephone Encounter (Signed)
Note sent to Dr. Gwenlyn Found and Curt Bears.

## 2013-09-10 NOTE — Progress Notes (Signed)
Called Dr Kennon Holter office about if ok for pt to stop taking Plavix for upcoming surgery on Mon, July 8th. Office to call Mr Heinicke and let him know when to stop plavix.  Mr Sivers called and encouraged to call Dr Kennon Holter office before 5pm if he has not heard from the office by then.

## 2013-09-10 NOTE — Pre-Procedure Instructions (Signed)
[name]  09/10/2013   Your procedure is scheduled on:  July 8th at 1000  Report to [facility] at [phone].  Call this number if you have problems the morning of surgery: [phone]   Remember:   Do not eat food or drink liquids after midnight.   Take these medicines the morning of surgery with A SIP OF WATER: Norvasc (amlodipine), Coreg (corvedilol), Oxycodone if needed  Stop taking Aspirin, Aleve, Ibuprofen, BC's, Goody's, Herbal medication, and Fish Oil   Do not wear jewelry, make-up or nail polish.  Do not wear lotions, powders, or perfumes. You may wear deodorant.  Do not shave 48 hours prior to surgery. Men may shave face and neck.  Do not bring valuables to the hospital.  [facility] is not responsible  for any belongings or valuables.               Contacts, dentures or bridgework may not be worn into surgery.  Leave suitcase in the car. After surgery it may be brought to your room.  For patients admitted to the hospital, discharge time is determined by your                treatment team.               Patients discharged the day of surgery will not be allowed to drive home.    Special Instructions: [place] - Preparing for Surgery  Before surgery, you can play an important role.  Because skin is not sterile, your skin needs to be as free of germs as possible.  You can reduce the number of germs on you skin by washing with CHG (chlorahexidine gluconate) soap before surgery.  CHG is an antiseptic cleaner which kills germs and bonds with the skin to continue killing germs even after washing.  Please DO NOT use if you have an allergy to CHG or antibacterial soaps.  If your skin becomes reddened/irritated stop using the CHG and inform your nurse when you arrive at Short Stay.  Do not shave (including legs and underarms) for at least 48 hours prior to the first CHG shower.  You may shave your face.  Please follow these instructions carefully:   1.   Shower with CHG Soap the night before surgery and the                                morning of Surgery.  2.  If you choose to wash your hair, wash your hair first as usual with your       normal shampoo.  3.  After you shampoo, rinse your hair and body thoroughly to remove the                      Shampoo.  4.  Use CHG as you would any other liquid soap.  You can apply chg directly       to the skin and wash gently with scrungie or a clean washcloth.  5.  Apply the CHG Soap to your body ONLY FROM THE NECK DOWN.        Do not use on open wounds or open sores.  Avoid contact with your eyes,       ears, mouth and genitals (private parts).  Wash genitals (private parts)       with your normal soap.  6.  Wash  thoroughly, paying special attention to the area where your surgery        will be performed.  7.  Thoroughly rinse your body with warm water from the neck down.  8.  DO NOT shower/wash with your normal soap after using and rinsing off       the CHG Soap.  9.  Pat yourself dry with a clean towel.            10.  Wear clean pajamas.            11.  Place clean sheets on your bed the night of your first shower and do not        sleep with pets.  Day of Surgery  Do not apply any lotions/deoderants the morning of surgery.  Please wear clean clothes to the hospital/surgery center.      Please read over the following fact sheets that you were given: Pain Booklet, Coughing and Deep Breathing and Surgical Site Infection Prevention

## 2013-09-10 NOTE — Telephone Encounter (Signed)
She need to know if pt is cleared for surgery. Please fax to [phone] Att:Amy.

## 2013-09-10 NOTE — Telephone Encounter (Signed)
Authorization required to HOLD Plavix 5 days prior to Tonsillectomy sent to Dr. Gwenlyn Found & Curt Bears.  Surgery is scheduled for 09/16/13.

## 2013-09-10 NOTE — Progress Notes (Addendum)
PCP is Dr Wenda Low Cardiologist is Dr Gwenlyn Found Pt states that he is diabetic and has not required insulin in about two years, he controls his Diabetes with his diet. Peri-op prescription for implanted cardiac devise programming orders faxed to Dr Kennon Holter office.

## 2013-09-10 NOTE — Telephone Encounter (Signed)
Dr Kennon Holter office note was sent to Dr Erik Obey this am.  The note detailed that Mr Essler was his risk for surgery given his carotid disease and LV dysfunction.

## 2013-09-10 NOTE — Telephone Encounter (Signed)
Wants too know if he can stop his Plavix for 5 days before his surgery which is on 09-16-13.Pt is having his tonsils removed and looking in his esophagus.

## 2013-09-10 NOTE — Telephone Encounter (Signed)
[name] from [name]  is calling because she is leaving for today and is wanting someone to call the patientt and instruct him on when he is to stopped plavix before Surgery on Monday

## 2013-09-14 ENCOUNTER — Encounter (HOSPITAL_COMMUNITY): Payer: Self-pay | Admitting: Vascular Surgery

## 2013-09-14 NOTE — Progress Notes (Signed)
Anesthesia Chart Review:  Patient is a 68 year old male who was scheduled for direct laryngoscopy, esophagoscopy, bronchoscopy, left tonsil biopsy versus tonsillectomy on [id number] by Dr. Erik Obey.  However, surgery is now postponed because his cardiologist Dr. Gwenlyn Found would not clear him until he was further evaluated for severe carotid artery stenosis.  History includes CAD s/p CABG x 3 in 2006 with a LIMA to his LAD, vein to circumflex and PDA, combined chronic CHF, ischemic CM, AICD 04/2012, carotid artery disease, HTN, COPD with tobacco abuse, diabetes, hypertension, left bundle branch block and chronic kidney disease.   Myoview stress test performed 09/02/13 showed: Overall Impression: Low risk stress nuclear study normal  perfusion without ischemia with EF 40% improved from 34%. LV Wall Motion: Mild-moderately reduced LV Function, EF 40%; Mild apical septal hypokinesis may be contributed by conduction abnormality.  Echo on 04/01/12 showed: - Left ventricle: The cavity size was normal. Wall thickness was increased in a pattern of mild LVH. Systolic function was severely reduced. The estimated ejection fraction was in the range of 25% to 30%. There is moderate hypokinesis of the inferolateral, inferior, and inferoseptal myocardium; consistent with ischemia or infarction in the distribution of the right coronary and left circumflex coronary artery. Features are consistent with a pseudonormal left ventricular filling pattern, with concomitant abnormal relaxation and increased filling pressure (grade 2 diastolic dysfunction). Doppler parameters are consistent with elevated mean left atrial filling pressure. - Mitral valve: Mild regurgitation. - Left atrium: The atrium was mildly dilated. - Atrial septum: The septum bowed from left to right, consistent with increased left atrial pressure. No defect or patent foramen ovale was identified.  EKG on 07/24/13 showed NSR, non-specific intraventricular block, most  closely resembling left BBB.  Dr. Kennon Holter notes indicated that he is planning to refer patient to vascular surgery for consideration of right CEA versus right carotid stent.  I spoke with Dr. Erik Obey. Plans to postpone his ENT procedure until patient's carotid stenosis has been further evaluated.  George Hugh [facility] Short Stay Center/Anesthesiology Phone [phone] 09/14/2013 11:51 AM

## 2013-09-16 ENCOUNTER — Ambulatory Visit (HOSPITAL_COMMUNITY): Admission: RE | Admit: 2013-09-16 | Payer: Medicare Other | Source: Ambulatory Visit | Admitting: Otolaryngology

## 2013-09-16 HISTORY — DX: Unspecified osteoarthritis, unspecified site: M19.90

## 2013-09-16 HISTORY — DX: Presence of automatic (implantable) cardiac defibrillator: Z95.810

## 2013-09-16 SURGERY — LARYNGOSCOPY, DIRECT
Anesthesia: General

## 2013-09-24 ENCOUNTER — Other Ambulatory Visit: Payer: Self-pay | Admitting: *Deleted

## 2013-09-24 DIAGNOSIS — I6529 Occlusion and stenosis of unspecified carotid artery: Secondary | ICD-10-CM

## 2013-10-02 ENCOUNTER — Encounter: Payer: Self-pay | Admitting: Surgery

## 2013-10-05 ENCOUNTER — Ambulatory Visit (INDEPENDENT_AMBULATORY_CARE_PROVIDER_SITE_OTHER): Payer: Medicare Other | Admitting: Surgery

## 2013-10-05 ENCOUNTER — Encounter: Payer: Self-pay | Admitting: Surgery

## 2013-10-05 ENCOUNTER — Other Ambulatory Visit: Payer: Self-pay

## 2013-10-05 VITALS — BP 147/75 | HR 65 | Ht 68.0 in | Wt 173.0 lb

## 2013-10-05 DIAGNOSIS — I6529 Occlusion and stenosis of unspecified carotid artery: Secondary | ICD-10-CM | POA: Diagnosis not present

## 2013-10-05 DIAGNOSIS — Z8679 Personal history of other diseases of the circulatory system: Secondary | ICD-10-CM | POA: Insufficient documentation
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
## 2013-10-06 ENCOUNTER — Encounter (HOSPITAL_COMMUNITY): Payer: Self-pay | Admitting: Pharmacy Technician

## 2013-10-06 NOTE — Progress Notes (Signed)
Give me a call today and we can discuss [name].  JJB

## 2013-10-16 NOTE — Progress Notes (Signed)
Phone call to pt.  Informed that Medicare may not cover his Carotid Stent, initially, and that the hospital will have the option of appealing the denial, if they do deny paying.  Advised that Medicare may eventually pay for the procedure, after reviewing the case and clinical information, but there would not be a guarantee of this.  Then, explained to pt. that Medicare covers treatment of carotid stenosis via the open surgery of "Carotid Endarterectomy."  Questioned pt. If he understood.  Stated he didn't want an incision made on his neck, and wanted to proceed with the Carotid Stent.

## 2013-10-19 ENCOUNTER — Encounter: Payer: Medicare Other | Admitting: Surgery

## 2013-10-19 ENCOUNTER — Other Ambulatory Visit (HOSPITAL_COMMUNITY): Payer: Medicare Other

## 2013-10-19 MED ORDER — SODIUM CHLORIDE 0.9 % IV SOLN
INTRAVENOUS | Status: DC
  Administered 2013-10-20: 06:00:00 via INTRAVENOUS

## 2013-10-20 ENCOUNTER — Other Ambulatory Visit: Payer: Self-pay | Admitting: Physician Assistant

## 2013-10-20 ENCOUNTER — Inpatient Hospital Stay (HOSPITAL_COMMUNITY)
Admission: RE | Admit: 2013-10-20 | Discharge: 2013-10-21 | DRG: 035 | Disposition: A | Discharge status: Home / Self Care | Payer: Medicare Other | Source: Ambulatory Visit | Attending: Cardiovascular Disease | Admitting: Cardiovascular Disease

## 2013-10-20 ENCOUNTER — Encounter (HOSPITAL_COMMUNITY): Payer: Self-pay | Admitting: *Deleted

## 2013-10-20 ENCOUNTER — Encounter (HOSPITAL_COMMUNITY)
Admission: RE | Disposition: A | Discharge status: Home / Self Care | Payer: Medicare Other | Source: Ambulatory Visit | Attending: Cardiovascular Disease

## 2013-10-20 DIAGNOSIS — Z951 Presence of aortocoronary bypass graft: Secondary | ICD-10-CM | POA: Diagnosis not present

## 2013-10-20 DIAGNOSIS — I6521 Occlusion and stenosis of right carotid artery: Secondary | ICD-10-CM

## 2013-10-20 DIAGNOSIS — J449 Chronic obstructive pulmonary disease, unspecified: Secondary | ICD-10-CM | POA: Diagnosis present

## 2013-10-20 DIAGNOSIS — I2510 Atherosclerotic heart disease of native coronary artery without angina pectoris: Secondary | ICD-10-CM | POA: Diagnosis present

## 2013-10-20 DIAGNOSIS — Z833 Family history of diabetes mellitus: Secondary | ICD-10-CM | POA: Diagnosis not present

## 2013-10-20 DIAGNOSIS — I739 Peripheral vascular disease, unspecified: Secondary | ICD-10-CM

## 2013-10-20 DIAGNOSIS — Z7902 Long term (current) use of antithrombotics/antiplatelets: Secondary | ICD-10-CM | POA: Diagnosis not present

## 2013-10-20 DIAGNOSIS — E7800 Pure hypercholesterolemia, unspecified: Secondary | ICD-10-CM | POA: Diagnosis present

## 2013-10-20 DIAGNOSIS — I129 Hypertensive chronic kidney disease with stage 1 through stage 4 chronic kidney disease, or unspecified chronic kidney disease: Secondary | ICD-10-CM | POA: Diagnosis present

## 2013-10-20 DIAGNOSIS — I5042 Chronic combined systolic (congestive) and diastolic (congestive) heart failure: Secondary | ICD-10-CM | POA: Diagnosis present

## 2013-10-20 DIAGNOSIS — E785 Hyperlipidemia, unspecified: Secondary | ICD-10-CM | POA: Diagnosis present

## 2013-10-20 DIAGNOSIS — Z95810 Presence of automatic (implantable) cardiac defibrillator: Secondary | ICD-10-CM

## 2013-10-20 DIAGNOSIS — F17200 Nicotine dependence, unspecified, uncomplicated: Secondary | ICD-10-CM | POA: Diagnosis present

## 2013-10-20 DIAGNOSIS — N183 Chronic kidney disease, stage 3 unspecified: Secondary | ICD-10-CM | POA: Diagnosis present

## 2013-10-20 DIAGNOSIS — J4489 Other specified chronic obstructive pulmonary disease: Secondary | ICD-10-CM | POA: Diagnosis present

## 2013-10-20 DIAGNOSIS — Z7982 Long term (current) use of aspirin: Secondary | ICD-10-CM

## 2013-10-20 DIAGNOSIS — Z87891 Personal history of nicotine dependence: Secondary | ICD-10-CM | POA: Diagnosis not present

## 2013-10-20 DIAGNOSIS — I6529 Occlusion and stenosis of unspecified carotid artery: Principal | ICD-10-CM | POA: Diagnosis present

## 2013-10-20 DIAGNOSIS — Z8249 Family history of ischemic heart disease and other diseases of the circulatory system: Secondary | ICD-10-CM

## 2013-10-20 DIAGNOSIS — I447 Left bundle-branch block, unspecified: Secondary | ICD-10-CM | POA: Diagnosis present

## 2013-10-20 DIAGNOSIS — I252 Old myocardial infarction: Secondary | ICD-10-CM

## 2013-10-20 DIAGNOSIS — E119 Type 2 diabetes mellitus without complications: Secondary | ICD-10-CM | POA: Diagnosis present

## 2013-10-20 DIAGNOSIS — I779 Disorder of arteries and arterioles, unspecified: Secondary | ICD-10-CM

## 2013-10-20 DIAGNOSIS — I2589 Other forms of chronic ischemic heart disease: Secondary | ICD-10-CM | POA: Diagnosis present

## 2013-10-20 DIAGNOSIS — I509 Heart failure, unspecified: Secondary | ICD-10-CM | POA: Diagnosis present

## 2013-10-20 HISTORY — PX: CAROTID STENT INSERTION: SHX5505

## 2013-10-20 LAB — GLUCOSE, CAPILLARY
GLUCOSE-CAPILLARY: 83 mg/dL (ref 70–99)
Glucose-Capillary: 80 mg/dL (ref 70–99)

## 2013-10-20 LAB — POCT I-STAT, CHEM 8
BUN: 35 mg/dL — ABNORMAL HIGH (ref 6–23)
CHLORIDE: 111 meq/L (ref 96–112)
CREATININE: 2 mg/dL — AB (ref 0.50–1.35)
Calcium, Ion: 1.32 mmol/L — ABNORMAL HIGH (ref 1.13–1.30)
GLUCOSE: 94 mg/dL (ref 70–99)
HCT: 33 % — ABNORMAL LOW (ref 39.0–52.0)
Hemoglobin: 11.2 g/dL — ABNORMAL LOW (ref 13.0–17.0)
Potassium: 4.5 mEq/L (ref 3.7–5.3)
Sodium: 142 mEq/L (ref 137–147)
TCO2: 18 mmol/L (ref 0–100)

## 2013-10-20 LAB — MRSA PCR SCREENING: MRSA by PCR: NEGATIVE

## 2013-10-20 SURGERY — CAROTID STENT INSERTION
Anesthesia: LOCAL | Laterality: Right

## 2013-10-20 MED ORDER — AMLODIPINE BESYLATE 10 MG PO TABS
10.0000 mg | ORAL_TABLET | Freq: Every day | ORAL | Status: DC
  Administered 2013-10-20 – 2013-10-21 (×2): 10 mg via ORAL
  Filled 2013-10-20 (×2): qty 1

## 2013-10-20 MED ORDER — CLOPIDOGREL BISULFATE 75 MG PO TABS
ORAL_TABLET | ORAL | Status: AC
  Filled 2013-10-20: qty 1

## 2013-10-20 MED ORDER — HEPARIN (PORCINE) IN NACL 2-0.9 UNIT/ML-% IJ SOLN
INTRAMUSCULAR | Status: AC
  Filled 2013-10-20: qty 1000

## 2013-10-20 MED ORDER — BIVALIRUDIN 250 MG IV SOLR
INTRAVENOUS | Status: AC
  Filled 2013-10-20: qty 250

## 2013-10-20 MED ORDER — ASPIRIN EC 325 MG PO TBEC: 325.0000 mg | DELAYED_RELEASE_TABLET | Freq: Every day | ORAL | Status: DC

## 2013-10-20 MED ORDER — LIDOCAINE HCL (PF) 1 % IJ SOLN
INTRAMUSCULAR | Status: AC
  Filled 2013-10-20: qty 30

## 2013-10-20 MED ORDER — TRAZODONE HCL 150 MG PO TABS
150.0000 mg | ORAL_TABLET | Freq: Every evening | ORAL | Status: DC | PRN
  Administered 2013-10-20: 150 mg via ORAL
  Filled 2013-10-20: qty 1

## 2013-10-20 MED ORDER — MORPHINE SULFATE 2 MG/ML IJ SOLN
1.0000 mg | INTRAMUSCULAR | Status: DC | PRN
Start: 2013-10-20 — End: 2013-10-21

## 2013-10-20 MED ORDER — NOREPINEPHRINE BITARTRATE 1 MG/ML IV SOLN
INTRAVENOUS | Status: AC
  Filled 2013-10-20: qty 4

## 2013-10-20 MED ORDER — ASPIRIN 81 MG PO CHEW
CHEWABLE_TABLET | ORAL | Status: AC
  Filled 2013-10-20: qty 1

## 2013-10-20 MED ORDER — ASPIRIN 325 MG PO TABS
325.0000 mg | ORAL_TABLET | Freq: Every day | ORAL | Status: DC
  Administered 2013-10-21: 325 mg via ORAL
  Filled 2013-10-20: qty 1

## 2013-10-20 MED ORDER — POTASSIUM CHLORIDE CRYS ER 20 MEQ PO TBCR
20.0000 meq | EXTENDED_RELEASE_TABLET | Freq: Two times a day (BID) | ORAL | Status: DC
  Administered 2013-10-20 – 2013-10-21 (×3): 20 meq via ORAL
  Filled 2013-10-20 (×4): qty 1

## 2013-10-20 MED ORDER — FUROSEMIDE 20 MG PO TABS
20.0000 mg | ORAL_TABLET | Freq: Every day | ORAL | Status: DC
  Administered 2013-10-20 – 2013-10-21 (×2): 20 mg via ORAL
  Filled 2013-10-20 (×2): qty 1

## 2013-10-20 MED ORDER — SODIUM CHLORIDE 0.9 % IV SOLN: INTRAVENOUS | Status: AC

## 2013-10-20 MED ORDER — CARVEDILOL 6.25 MG PO TABS
6.2500 mg | ORAL_TABLET | Freq: Two times a day (BID) | ORAL | Status: DC
  Administered 2013-10-20: 6.25 mg via ORAL
  Filled 2013-10-20 (×4): qty 1

## 2013-10-20 MED ORDER — ACETAMINOPHEN 325 MG PO TABS: 650.0000 mg | ORAL_TABLET | ORAL | Status: DC | PRN

## 2013-10-20 MED ORDER — HYDRALAZINE HCL 50 MG PO TABS
50.0000 mg | ORAL_TABLET | Freq: Two times a day (BID) | ORAL | Status: DC
  Administered 2013-10-20 – 2013-10-21 (×3): 50 mg via ORAL
  Filled 2013-10-20 (×4): qty 1

## 2013-10-20 MED ORDER — CLOPIDOGREL BISULFATE 75 MG PO TABS
75.0000 mg | ORAL_TABLET | Freq: Every day | ORAL | Status: DC
  Filled 2013-10-20: qty 1

## 2013-10-20 MED ORDER — ATROPINE SULFATE 0.1 MG/ML IJ SOLN
INTRAMUSCULAR | Status: AC
  Filled 2013-10-20: qty 10

## 2013-10-20 MED ORDER — ONDANSETRON HCL 4 MG/2ML IJ SOLN
4.0000 mg | Freq: Four times a day (QID) | INTRAMUSCULAR | Status: DC | PRN
Start: 2013-10-20 — End: 2013-10-21

## 2013-10-20 MED ORDER — CLOPIDOGREL BISULFATE 75 MG PO TABS
75.0000 mg | ORAL_TABLET | Freq: Every day | ORAL | Status: DC
  Administered 2013-10-21: 75 mg via ORAL
  Filled 2013-10-20: qty 1

## 2013-10-20 MED ORDER — ATORVASTATIN CALCIUM 40 MG PO TABS
40.0000 mg | ORAL_TABLET | Freq: Every day | ORAL | Status: DC
  Administered 2013-10-20 – 2013-10-21 (×2): 40 mg via ORAL
  Filled 2013-10-20 (×2): qty 1

## 2013-10-20 NOTE — Progress Notes (Signed)
Neuro assessment stable w/o deficits. Alert and oriented.

## 2013-10-20 NOTE — Progress Notes (Addendum)
No neuro deficits.Requesting something to eat. Called for family. Continue to wait for TCU bed.

## 2013-10-20 NOTE — Progress Notes (Signed)
Neuro assessment unchanged. Drank a few small sips of diet coke.

## 2013-10-20 NOTE — Progress Notes (Signed)
Neuro assessment unchanged; no deficits. Resting quietly.

## 2013-10-20 NOTE — Progress Notes (Signed)
Utilization Review Completed.Donne Anon T8/01/2014

## 2013-10-20 NOTE — Progress Notes (Signed)
Site area: rt groin femoral arterial sheath Site Prior to Removal:  Level 0 Pressure Applied For: 20 Manual:  yes  Patient Status During Pull:  stable Post Pull Site:  Level 0 Post Pull Instructions Given:  yes Post Pull Pulses Present: yes Dressing Applied:  tegaderm Bedrest begins @ 1120 Comments: no complications. Patient understands instructions.

## 2013-10-20 NOTE — Progress Notes (Signed)
Family in to see. Patient eating [place] sandwich.

## 2013-10-20 NOTE — Interval H&P Note (Signed)
History and Physical Interval Note:  10/20/2013 7:49 AM  [name]  has presented today for surgery, with the diagnosis of pvd  The various methods of treatment have been discussed with the patient and family. After consideration of risks, benefits and other options for treatment, the patient has consented to  Procedure(s): CAROTID STENT INSERTION (Right) as a surgical intervention .  The patient's history has been reviewed, patient examined, no change in status, stable for surgery.  I have reviewed the patient's chart and labs.  Questions were answered to the patient's satisfaction.     Marg Macmaster IV, V. WELLS

## 2013-10-20 NOTE — Progress Notes (Signed)
Normal neuro check
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
## 2013-10-20 NOTE — Op Note (Signed)
    Patient name: [name] MRN: [id number] DOB: [birth year] Sex: male  10/20/2013 Pre-operative Diagnosis: Right carotid stenosis Post-operative diagnosis:  Same Surgeon:  Eldridge Abrahams, Quay Burow Procedure Performed:  1.  right carotid stenting with distal embolic protection   Indications:  The patient is a high-grade right carotid stenosis.  Is also being worked up for possible head and neck cancer.  He comes in today for further evaluation of his carotid artery and possible stenting  Procedure:  Please see procedural dictation by Dr. Quay Burow for full details of the angiographic/diagnostic portion of the procedure.  Intervention:  After Dr. Donnella Bi performed diagnostic imaging which revealed greater than 80% right carotid stenosis, the decision was made to proceed with intervention.  A long 6 French sheath was inserted.  Using a JB 1 guide catheter and a Amplatz superstiff wire, the long sheath was advanced into the distal right common carotid artery.  A Angiomax bolus and continuous infusion were administered.  The ACT was confirmed to be greater than 300.  Next a large NAV -6 filter was easily advanced across the lesion.  The lesion was then predilated using a 3 x 20 balloon.  We selected a XACT 9 x 7 x 30.  This was easily advanced across the lesion and then deployed.  It was dilated using a 5 x 2 balloon.  Completion angiography revealed resolution of the stenosis to less than 10%.  Intracranial imaging was then obtained.  This will be separately dictated by the neuroradiology.  The filter was then retrieved.  There was no debris within the filter after examination.  The long sheath was exchanged out for a short 6 [place] sheath.  The patient remained neurologically intact there were no immediate complications.  Impression:  #1  successful right carotid stenting with distal embolic protection using a 9 x 7 x 30 stent   V. Annamarie Major, M.D. [facility] Office: [phone] Pager:  [phone]

## 2013-10-20 NOTE — Interval H&P Note (Signed)
History and Physical Interval Note:  10/20/2013 7:47 AM  [name]  has presented today for surgery, with the diagnosis of pvd  The various methods of treatment have been discussed with the patient and family. After consideration of risks, benefits and other options for treatment, the patient has consented to  Procedure(s): CAROTID STENT INSERTION (Right) as a surgical intervention .  The patient's history has been reviewed, patient examined, no change in status, stable for surgery.  I have reviewed the patient's chart and labs.  Questions were answered to the patient's satisfaction.     Lorretta Harp

## 2013-10-20 NOTE — Progress Notes (Signed)
Neuro assessment: speech clear and appropriate, upper and lower extremity strength equal bilaterally,mouth slightly droops down to the right(in report, this was normal for him), smile is symmetrical, PERRL

## 2013-10-20 NOTE — Progress Notes (Signed)
Neuro assessment unchanged; no deficits.

## 2013-10-20 NOTE — CV Procedure (Signed)
[name] is a 68 y.o. male    [id number] LOCATION:  FACILITY: [facility]  PHYSICIAN: Quay Burow, M.D. 11-21-1945   DATE OF PROCEDURE:  10/20/2013  DATE OF DISCHARGE:     PV Angiogram/Intervention    History obtained from chart review.[name] is a 89 show thin appearing African American male history of CAD status post bypass grafting x3 in 2006 with a LIMA to his LAD, vein to circumflex and PDA. His other problems include COPD with tobacco abuse, diabetes, hypertension, left bundle branch block and chronic kidney disease. He had an IV ICD placed in February 2014 followup by Dr. Sallyanne Kuster in our office. He denies chest pain or shortness of breath.recent carotid Doppler suggested a high-grade right internal carotid artery stenosis. Myoview stress test performed 09/02/13 showed no ischemia. He apparently is scheduled for upcoming surgical procedure which is certainly at high risk for given his carotid disease and his LV dysfunction. I referred him to Dr. Trula Slade for vascular surgical evaluation. He agreed that [name] was a better stent candidate. He has been on aspirin and Plavix. He will need his tonsillar cancer evaluated by Dr. Erik Obey  Post carotid stenting. He presents now for cerebral angiography and potential carotid stenting.   PROCEDURE DESCRIPTION:   The patient was brought to the second floor [facility] cath lab in the postabsorptive state. He was not premedicated  His right groinwas prepped and shaved in usual sterile fashion. Xylocaine 1% was used  for local anesthesia. A 6 French sheath was inserted into the right common femoral artery using standard Seldinger technique.a 6 French pigtail catheter were used was used for arch angiography and a 6 [place] JB1 catheter was used for selective right carotid angiography obtaining both intra-and extracranial views. Omnipaque dye was used for the entirety of the case. Retrograde aortic pressure was monitored during the  case.   HEMODYNAMICS:    AO SYSTOLIC/AO DIASTOLIC: 0000000   Angiographic Data:   1: Arch aortogram-bovine arch  2: Right carotid angiography: 80% proximal hypodense right internal carotid artery stenosis. The intracranial cerebral anatomy will be read and interpreted by the neuro interventional radiology  IMPRESSION:[name] has a high-grade proximal right internal carotid artery stenosis which corresponds to his abnormal duplex ultrasound. Because of his potential need for neck radiation and surgery because of his tonsillar cancer it was decided by Dr. Trula Slade, vascular surgeon, and myself that carotid stenting with a better revascularization strategy. We will proceed with carotid artery stenting using Angiomax, distal protection and Nitinol self-expanding stent.    Lorretta Harp MD, [facility] 10/20/2013 9:05 AM

## 2013-10-21 ENCOUNTER — Other Ambulatory Visit: Payer: Self-pay | Admitting: *Deleted

## 2013-10-21 DIAGNOSIS — Z48812 Encounter for surgical aftercare following surgery on the circulatory system: Secondary | ICD-10-CM

## 2013-10-21 DIAGNOSIS — I6529 Occlusion and stenosis of unspecified carotid artery: Secondary | ICD-10-CM

## 2013-10-21 LAB — CBC
HCT: 27.8 % — ABNORMAL LOW (ref 39.0–52.0)
Hemoglobin: 9.4 g/dL — ABNORMAL LOW (ref 13.0–17.0)
MCH: 32 pg (ref 26.0–34.0)
MCHC: 33.8 g/dL (ref 30.0–36.0)
MCV: 94.6 fL (ref 78.0–100.0)
Platelets: 92 10*3/uL — ABNORMAL LOW (ref 150–400)
RBC: 2.94 MIL/uL — AB (ref 4.22–5.81)
RDW: 14.3 % (ref 11.5–15.5)
WBC: 2.9 10*3/uL — AB (ref 4.0–10.5)

## 2013-10-21 LAB — BASIC METABOLIC PANEL
Anion gap: 13 (ref 5–15)
BUN: 12 mg/dL (ref 6–23)
CHLORIDE: 108 meq/L (ref 96–112)
CO2: 26 meq/L (ref 19–32)
Calcium: 9 mg/dL (ref 8.4–10.5)
Creatinine, Ser: 1.06 mg/dL (ref 0.50–1.35)
GFR calc non Af Amer: 71 mL/min — ABNORMAL LOW (ref >90)
GFR, EST AFRICAN AMERICAN: 82 mL/min — AB (ref >90)
GLUCOSE: 120 mg/dL — AB (ref 70–99)
POTASSIUM: 4.3 meq/L (ref 3.7–5.3)
Sodium: 147 mEq/L (ref 137–147)

## 2013-10-21 LAB — POCT ACTIVATED CLOTTING TIME: ACTIVATED CLOTTING TIME: 349 s

## 2013-10-21 LAB — GLUCOSE, CAPILLARY
GLUCOSE-CAPILLARY: 96 mg/dL (ref 70–99)
GLUCOSE-CAPILLARY: 110 mg/dL — AB (ref 70–99)
GLUCOSE-CAPILLARY: 88 mg/dL (ref 70–99)

## 2013-10-21 MED FILL — Sodium Chloride IV Soln 0.9%: INTRAVENOUS | Qty: 50 | Status: AC

## 2013-10-21 NOTE — Progress Notes (Signed)
  [facility] Progress Note  10/21/2013 8:01 AM 1 Day Post-Op  Subjective:  Sitting on side of bed eating breakfast. No complaints. Ready to go home.    Filed Vitals:   10/21/13 0743  BP: 127/54  Pulse:   Temp: 98.4 F (36.9 C)  Resp: 23    Physical Exam: Neuro: no smile asymmetry or tongue deviation. No difficulties swallowing. 5/5 grip strength bilaterally Extremities:  No right groin hematoma.   CBC    Component Value Date/Time   WBC 2.9* 10/21/2013 0255   RBC 2.94* 10/21/2013 0255   HGB 9.4* 10/21/2013 0255   HCT 27.8* 10/21/2013 0255   PLT 92* 10/21/2013 0255   MCV 94.6 10/21/2013 0255   MCH 32.0 10/21/2013 0255   MCHC 33.8 10/21/2013 0255   RDW 14.3 10/21/2013 0255   LYMPHSABS 1.0 11/23/2009 1604   MONOABS 0.3 11/23/2009 1604   EOSABS 0.1 11/23/2009 1604   BASOSABS 0.0 11/23/2009 1604    BMET    Component Value Date/Time   NA 147 10/21/2013 0255   K 4.3 10/21/2013 0255   CL 108 10/21/2013 0255   CO2 26 10/21/2013 0255   GLUCOSE 120* 10/21/2013 0255   BUN 12 10/21/2013 0255   CREATININE 1.06 10/21/2013 0255   CALCIUM 9.0 10/21/2013 0255   CALCIUM 9.8 04/02/2012 1146   GFRNONAA 71* 10/21/2013 0255   GFRAA 82* 10/21/2013 0255    INR    Component Value Date/Time   INR 1.14 04/18/2012 0713     Intake/Output Summary (Last 24 hours) at 10/21/13 0801 Last data filed at 10/20/13 2100  Gross per 24 hour  Intake 584.17 ml  Output    450 ml  Net 134.17 ml     Assessment:  68 y.o. male is s/p:  Right carotid stenting with distal embolic protection  1 Day Post-Op  Plan: -Neurologically intact. No groin hematoma.  -Has been ambulating and voiding without difficulty. -On Plavix. -Discharge home today.    Virgina Jock, PA-C [facility] Office: [phone] Pager: [phone] 10/21/2013 8:01 AM

## 2013-10-21 NOTE — Progress Notes (Signed)
SUBJECTIVE:  No complaints  OBJECTIVE:   Vitals:   Filed Vitals:   10/21/13 0200 10/21/13 0300 10/21/13 0400 10/21/13 0743  BP: 144/63  145/59 127/54  Pulse:  60    Temp:  98.6 F (37 C)  98.4 F (36.9 C)  TempSrc:  Oral  Oral  Resp: 5 16 14 23   Height:      Weight:      SpO2:  92%     I&O's:   Intake/Output Summary (Last 24 hours) at 10/21/13 1028 Last data filed at 10/20/13 2100  Gross per 24 hour  Intake 584.17 ml  Output    450 ml  Net 134.17 ml   TELEMETRY: Reviewed telemetry pt in AV paced     PHYSICAL EXAM General: Well developed, well nourished, in no acute distress Head: Eyes PERRLA, No xanthomas.   Normal cephalic and atramatic  Lungs:   Clear bilaterally to auscultation and percussion. Heart:   HRRR S1 S2 Pulses are 2+ & equal. Abdomen: Bowel sounds are positive, abdomen soft and non-tender without masses Extremities:   No clubbing, cyanosis or edema.  DP +1 Neuro: Alert and oriented X 3. Psych:  Good affect, responds appropriately   LABS: Basic Metabolic Panel:  Recent Labs  10/20/13 0603 10/21/13 0255  NA 142 147  K 4.5 4.3  CL 111 108  CO2  --  26  GLUCOSE 94 120*  BUN 35* 12  CREATININE 2.00* 1.06  CALCIUM  --  9.0   Liver Function Tests: No results found for this basename: AST, ALT, ALKPHOS, BILITOT, PROT, ALBUMIN,  in the last 72 hours No results found for this basename: LIPASE, AMYLASE,  in the last 72 hours CBC:  Recent Labs  10/20/13 0603 10/21/13 0255  WBC  --  2.9*  HGB 11.2* 9.4*  HCT 33.0* 27.8*  MCV  --  94.6  PLT  --  92*   Cardiac Enzymes: No results found for this basename: CKTOTAL, CKMB, CKMBINDEX, TROPONINI,  in the last 72 hours BNP: No components found with this basename: POCBNP,  D-Dimer: No results found for this basename: DDIMER,  in the last 72 hours Hemoglobin A1C: No results found for this basename: HGBA1C,  in the last 72 hours Fasting Lipid Panel: No results found for this basename: CHOL, HDL,  LDLCALC, TRIG, CHOLHDL, LDLDIRECT,  in the last 72 hours Thyroid Function Tests: No results found for this basename: TSH, T4TOTAL, FREET3, T3FREE, THYROIDAB,  in the last 72 hours Anemia Panel: No results found for this basename: VITAMINB12, FOLATE, FERRITIN, TIBC, IRON, RETICCTPCT,  in the last 72 hours Coag Panel:   Lab Results  Component Value Date   INR 1.14 04/18/2012   INR 1.09 04/02/2012   INR 1.04 10/15/2009    RADIOLOGY: No results found.  ASSESSMENT: 1.  Asymptomatic high grade right carotid artery stenosis s/p carotid artery stenting - he is neurologically intact and ambulating without difficulty - continue DAPT with ASA/Plavix 2.  Tonsillar cancer 3.  ASCAD s/p CABG 4.  Dyslipidemia - continue atorvastatin 5.  HTN - well controlled - continue amlodipine/coreg/hydralazine 6.  Chronic combined systolic/diastolic CHF NYHA Class 2 s/p AICD - continue BB/Lasix/hydralazine 7.  CKD stage 3 - creatinine stable post angiography 8.  Leukopenia - WBC has ranged in the past from 2.8-4 - currently 2.9 - followup with PCP as outpt  Stable for d/c home today with followup with Dr. Gwenlyn Found in 2 weeks and PCP in next week for followup of  low WBC  John Margarita, MD  10/21/2013  10:28 AM

## 2013-10-21 NOTE — Discharge Summary (Signed)
Agree with discharge summary as outlined by Lyda Jester, PA-C

## 2013-10-21 NOTE — Clinical Documentation Improvement (Signed)
Presents with carotid stenosis; stent placed. Being worked up for head/neck cancer.  Patient with abnormal lab values: WBC = 2.9,  H&H = 9.4 / 27.8,  Plates = 92  Please provide a diagnosis associated with the above clinical data and render an opinion of in next progress note and continue into discharge summary if applicable.  Thank You, Zoila Shutter ,RN Clinical Documentation Specialist:  [place] Information Management

## 2013-10-21 NOTE — Discharge Summary (Signed)
Physician Discharge Summary  Patient ID: [name] MRN: [id number] DOB/AGE: 67-Apr-1947 67 y.o.  Admit date: 10/20/2013 Discharge date: 10/21/2013  Admission Diagnoses: High Grade Carotid Artery Disease  Discharge Diagnoses:  Active Problems:   Carotid artery disease   Discharged Condition: stable  Hospital Course: The patient is a 68 African American male with a history of CAD status post bypass grafting x3 in 2006 with a LIMA to his LAD, vein to circumflex and PDA.  Myoview stress test performed 09/02/13 showed no ischemia. His other problems include carotid artery disease, cardiomyopathy (EF 25-30%) with subsequent ICD insertion in 2014, COPD with tobacco abuse, diabetes, hypertension, left bundle branch block and chronic kidney disease. He is currently undergoing w/u for possible head/neck cancer, necessitating tonsillectomy by Dr. Erik Obey. He was referred to Dr. Gwenlyn Found for surgical clearance. However, Dr. Gwenlyn Found felt that he would be high risk, given his carotid artery disease and LV dysfunction. His most recent carotid doppler study revealed greater than 80% right carotid stenosis. Dr. Gwenlyn Found referred him to Dr. Trula Slade for evaluation. It was decided to pursue right carotid stenting with distal embolic protection. This was performed by Dr. Trula Slade on 10/20/13. The stenosis was successfully treated using a 9 x 7 x 30 stent. He tolerated the procedure well, w/o complications. Post procedural neuro exam was negative for any deficits. He was placed on DAPT with ASA + Plavix. On post procedural day 1, he was seen and examined by both Vascular Surgery and Cardiology. Both parties felt that he was stable. Dr. Radford Pax cleared him for discharge home. Post hospital f/u was arranged with Dr. Gwenlyn Found on 11/10/13. He will also f/u with Dr. Trula Slade.    Consults: vascular surgery  Significant Diagnostic Studies:   See Carotid Doppler Report from 09/02/13  Treatments:  See Hospital Course  Discharge  Exam: Blood pressure 132/56, pulse 60, temperature 98.4 F (36.9 C), temperature source Oral, resp. rate 14, height 5\' 8"  (1.727 m), weight 169 lb (76.658 kg), SpO2 98.00%.   Disposition: 01-Home or Self Care      Discharge Instructions   CAROTID Sugery: Call MD for difficulty swallowing or speaking; weakness in arms or legs that is a new symtom; severe headache.  If you have increased swelling in the neck and/or  are having difficulty breathing, CALL 911    Complete by:  As directed      Call MD for:  redness, tenderness, or signs of infection (pain, swelling, bleeding, redness, odor or green/yellow discharge around incision site)    Complete by:  As directed      Call MD for:  severe or increased pain, loss or decreased feeling  in affected limb(s)    Complete by:  As directed      Call MD for:  temperature >100.5    Complete by:  As directed      Discharge wound care:    Complete by:  As directed   Shower daily with soap and water starting 10/22/13     Driving Restrictions    Complete by:  As directed   No driving for 2 weeks     Lifting restrictions    Complete by:  As directed   No lifting for 3 weeks     Resume previous diet    Complete by:  As directed             Medication List         amLODipine 10 MG tablet  Commonly known as:  NORVASC  Take 10 mg by mouth daily.     aspirin 325 MG tablet  Take 325 mg by mouth daily.     atorvastatin 40 MG tablet  Commonly known as:  LIPITOR  Take 40 mg by mouth daily.     carvedilol 6.25 MG tablet  Commonly known as:  COREG  Take 6.25 mg by mouth 2 (two) times daily with a meal.     clopidogrel 75 MG tablet  Commonly known as:  PLAVIX  Take 75 mg by mouth daily.     furosemide 20 MG tablet  Commonly known as:  LASIX  Take 20 mg by mouth daily.     hydrALAZINE 50 MG tablet  Commonly known as:  APRESOLINE  Take 1 tablet (50 mg total) by mouth 2 (two) times daily.     oxyCODONE-acetaminophen 10-325 MG per tablet   Commonly known as:  PERCOCET  Take 1 tablet by mouth every 8 (eight) hours as needed for pain.     potassium chloride SA 20 MEQ tablet  Commonly known as:  K-DUR,KLOR-CON  Take 20 mEq by mouth 2 (two) times daily.     traZODone 150 MG tablet  Commonly known as:  DESYREL  Take 150 mg by mouth at bedtime.       Follow-up Information   Follow up with Lorretta Harp, MD. Schedule an appointment as soon as possible for a visit in 2 weeks.   Specialty:  Cardiology   Contact information:   [address] [place] [address] [phone]       Follow up with Ramond Craver, [place], MD In 4 weeks. (Office will call you to arrange your appt (sent))    Specialty:  Vascular Surgery   Contact information:   [place] [address] [phone]       Follow up with Wenda Low, MD. Schedule an appointment as soon as possible for a visit in 1 week. (for low white blood cell count and platelet count)    Specialty:  Internal Medicine   Contact information:   [address], [address] [place] [address] [phone]      TIME SPENT ON DISCHARGE, INCLUDING PHYSICIAN TIME: >30 MINUTES  Signed: Lyda Jester 10/21/2013, 2:27 PM

## 2013-10-22 ENCOUNTER — Telehealth: Payer: Self-pay | Admitting: Surgery

## 2013-10-22 NOTE — Telephone Encounter (Addendum)
Message copied by Doristine Section on Thu Oct 22, 2013  1:18 PM ------      Message from: [place], [place] K      Created: Wed Oct 21, 2013 12:10 PM      Regarding: Schedule                   ----- Message -----         From: Gabriel Earing, PA-C         Sent: 10/21/2013  11:34 AM           To: Vvs Charge Pool            S/p right carotid stent 10/20/13.  F/u with VB in 4 weeks with carotid duplex.      thx ------  notified patient of post op appt. on 11-23-13 at 8:45 with dr. Trula Slade

## 2013-10-23 ENCOUNTER — Telehealth (HOSPITAL_COMMUNITY): Payer: Self-pay | Admitting: *Deleted

## 2013-10-23 NOTE — Progress Notes (Signed)
No issues s/p R carotid stent. OK for d/c

## 2013-10-26 ENCOUNTER — Encounter: Payer: Medicare Other | Admitting: *Deleted

## 2013-10-26 ENCOUNTER — Telehealth: Payer: Self-pay | Admitting: Cardiology

## 2013-10-26 NOTE — Telephone Encounter (Signed)
Spoke with pt and reminded pt of remote transmission that is due today. Pt verbalized understanding.   

## 2013-10-27 ENCOUNTER — Encounter: Payer: Self-pay | Admitting: Cardiology

## 2013-10-28 NOTE — Consult Note (Signed)
[name], [name]               ACCOUNT NO.:  [id number]  MEDICAL RECORD NO.:  [id number]  LOCATION:  2H23C                        FACILITY:  [facility]  PHYSICIAN:  Ilo Beamon K. Herberto Ledwell, M.D.DATE OF BIRTH:  [birth year]  DATE OF CONSULTATION: DATE OF DISCHARGE:  10/21/2013                                CONSULTATION   CLINICAL HISTORY:  Right carotid artery stenosis.  EXAMINATION:  Right common carotid arteriogram  intracranial interpretation before and after placement of right internal carotid artery stent  at  bifurcation.  FINDINGS:    The pre-stent right common carotid arteriogram demonstrates patency of the right internal carotid artery, petrous cavernous, and supraclinoid segments.  The right middle and right anterior cerebral arteries   opacify normally into the capillary and the venous phases.    Post stent placement, right common carotid arteriogram intracranially demonstrates improved hemodynamic flow into the middle and anterior cerebral artery circulations.  No evidence of occlusions or of intraluminal filling defect is seen.  IMPRESSION:  Post-stent assisted angioplasty of the right internal carotid artery at the bulb demonstrating improved hemodynamic flow intracranially .  No intraluminal filling defects seen.          ______________________________ Fritz Pickerel Estanislado Pandy, M.D.     SKD/MEDQ  D:  10/27/2013  T:  10/27/2013  Job:  [id number]

## 2013-10-30 ENCOUNTER — Ambulatory Visit: Payer: Medicare Other | Admitting: Cardiovascular Disease

## 2013-11-02 ENCOUNTER — Telehealth (HOSPITAL_COMMUNITY): Payer: Self-pay | Admitting: *Deleted

## 2013-11-09 DIAGNOSIS — Z1331 Encounter for screening for depression: Secondary | ICD-10-CM | POA: Diagnosis not present

## 2013-11-09 DIAGNOSIS — N529 Male erectile dysfunction, unspecified: Secondary | ICD-10-CM | POA: Diagnosis not present

## 2013-11-09 DIAGNOSIS — I779 Disorder of arteries and arterioles, unspecified: Secondary | ICD-10-CM | POA: Diagnosis not present

## 2013-11-09 DIAGNOSIS — J351 Hypertrophy of tonsils: Secondary | ICD-10-CM | POA: Diagnosis not present

## 2013-11-10 ENCOUNTER — Encounter: Payer: Self-pay | Admitting: Cardiovascular Disease

## 2013-11-10 ENCOUNTER — Ambulatory Visit (INDEPENDENT_AMBULATORY_CARE_PROVIDER_SITE_OTHER): Payer: Medicare Other | Admitting: Cardiovascular Disease

## 2013-11-10 VITALS — BP 116/57 | HR 81 | Ht 68.0 in | Wt 164.0 lb

## 2013-11-10 DIAGNOSIS — I2589 Other forms of chronic ischemic heart disease: Secondary | ICD-10-CM | POA: Diagnosis not present

## 2013-11-10 DIAGNOSIS — Z79899 Other long term (current) drug therapy: Secondary | ICD-10-CM

## 2013-11-10 DIAGNOSIS — I255 Ischemic cardiomyopathy: Secondary | ICD-10-CM

## 2013-11-10 DIAGNOSIS — E785 Hyperlipidemia, unspecified: Secondary | ICD-10-CM

## 2013-11-10 DIAGNOSIS — I2510 Atherosclerotic heart disease of native coronary artery without angina pectoris: Secondary | ICD-10-CM | POA: Diagnosis not present

## 2013-11-10 NOTE — Assessment & Plan Note (Signed)
Controlled on current medications 

## 2013-11-10 NOTE — Patient Instructions (Addendum)
Your physician recommends that you schedule a follow-up appointment in: 6 Month with PA and 12 Months with Dr Gwenlyn Found.  Your physician has requested that you have a carotid duplex. This test is an ultrasound of the carotid arteries in your neck. It looks at blood flow through these arteries that supply the brain with blood. Allow one hour for this exam. There are no restrictions or special instructions.  Your physician recommends that you return for lab work in: Fasting lipid liver

## 2013-11-10 NOTE — Progress Notes (Signed)
11/10/2013 [name]   1945-08-25  [id number]  Primary Physician John Low, MD Primary Cardiologist: John Harp MD [name]   HPI:  [name] is a 68 year old thin-appearing African American male history of CAD status post bypass grafting x3 in 2006 with a LIMA to his LAD, vein to circumflex and PDA. His other problems include COPD with tobacco abuse, diabetes, hypertension, left bundle branch block and chronic kidney disease. He had an IV ICD placed in February 2014 followup by [name] in our office. He denies chest pain or shortness of breath.recent carotid Doppler suggested a high-grade right internal carotid artery stenosis. Myoview stress test performed 09/02/13 showed no ischemia. He apparently is scheduled for upcoming surgical procedure which is certainly at high risk for given his carotid disease and his LV dysfunction. I referred him to [name] for vascular surgical evaluation. He agreed that [name] was a better stent candidate. He has been on aspirin and Plavix. He will need his tonsillar cancer evaluated by [name] Post carotid stenting. I [name] him 10/20/13 revealing an 80% proximal right internal carotid artery stenosis which which I stented along with [name] from using a 9 x 7 x 30 XACT Nitinol self-expanding stent and distal protection. The patient has been well since. He is on dual antiplatelet therapy.    Current Outpatient Prescriptions  Medication Sig Dispense Refill  . amLODipine (NORVASC) 10 MG tablet Take 10 mg by mouth daily.      Marland Kitchen aspirin 325 MG tablet Take 325 mg by mouth daily.      Marland Kitchen atorvastatin (LIPITOR) 40 MG tablet Take 40 mg by mouth daily.       . carvedilol (COREG) 6.25 MG tablet Take 6.25 mg by mouth 2 (two) times daily with a meal.      . clopidogrel (PLAVIX) 75 MG tablet Take 75 mg by mouth daily.      . furosemide (LASIX) 20 MG tablet Take 20 mg by mouth daily.      . hydrALAZINE (APRESOLINE) 50 MG  tablet Take 1 tablet (50 mg total) by mouth 2 (two) times daily.  180 tablet  3  . oxyCODONE-acetaminophen (PERCOCET) 10-325 MG per tablet Take 1 tablet by mouth every 8 (eight) hours as needed for pain.       . potassium chloride SA (K-DUR,KLOR-CON) 20 MEQ tablet Take 20 mEq by mouth 2 (two) times daily.      . traZODone (DESYREL) 150 MG tablet Take 150 mg by mouth at bedtime.       No current facility-administered medications for this visit.    No Known Allergies  History   Social History  . Marital Status: Divorced    Spouse Name: N/A    Number of Children: N/A  . Years of Education: N/A   Occupational History  . Not on file.   Social History Main Topics  . Smoking status: Former Smoker    Quit date: 04/18/1982  . Smokeless tobacco: Never Used  . Alcohol Use: No  . Drug Use: 7.00 per week    Special: Marijuana  . Sexual Activity: Not on file   Other Topics Concern  . Not on file   Social History Narrative  . No narrative on file     Review of Systems: General: negative for chills, fever, night sweats or weight changes.  Cardiovascular: negative for chest pain, dyspnea on exertion, edema, orthopnea, palpitations, paroxysmal nocturnal dyspnea or shortness of  breath Dermatological: negative for rash Respiratory: negative for cough or wheezing Urologic: negative for hematuria Abdominal: negative for nausea, vomiting, diarrhea, bright red blood per rectum, melena, or hematemesis Neurologic: negative for visual changes, syncope, or dizziness All other systems reviewed and are otherwise negative except as noted above.    Blood pressure 116/57, pulse 81, height 5\' 8"  (1.727 m), weight 164 lb (74.39 kg).  General appearance: alert and no distress Neck: no adenopathy, no JVD, supple, symmetrical, trachea midline, thyroid not enlarged, symmetric, no tenderness/mass/nodules and soft right carotid bruit Lungs: clear to auscultation bilaterally Heart: regular rate and  rhythm, S1, S2 normal, no murmur, click, rub or gallop Extremities: extremities normal, atraumatic, no cyanosis or edema  EKG sinus rhythm at 80 with left bundle-branch block  ASSESSMENT AND PLAN:   CORONARY ARTERY DISEASE, CABG 2006, X 3 LIMA-LAD, SVG-LCX OM, VG-PDA History of coronary artery bypass grafting x3 in 2006 with a LIMA to his LAD, vein to the circumflex and PDA. His last Myoview performed 09/02/13 showed no ischemia. He denies chest pain or shortness of breath.  Cardiomyopathy, ischemic, EF 25-30% EF in the 25-30% range. He denies chest pain or shortness of breath. He does have an ICD in place followed by [name] last checked in May of this year. He is on appropriate medications.  Essential hypertension Controlled on current medications  Carotid artery disease Status post right internal carotid artery stenting by myself and [name] him 10/20/13 with a 9 x 7 x 30 XACT diagnosis of expanding stent using distal protection. The patient is on dual antiplatelet therapy. We'll check carotid Doppler studies.      John Harp MD FACP,FACC,FAHA, [facility] 11/10/2013 11:06 AM

## 2013-11-10 NOTE — Assessment & Plan Note (Signed)
Status post right internal carotid artery stenting by myself and Dr. Mikki Santee him 10/20/13 with a 9 x 7 x 30 XACT diagnosis of expanding stent using distal protection. The patient is on dual antiplatelet therapy. We'll check carotid Doppler studies.

## 2013-11-10 NOTE — Assessment & Plan Note (Signed)
History of coronary artery bypass grafting x3 in 2006 with a LIMA to his LAD, vein to the circumflex and PDA. His last Myoview performed 09/02/13 showed no ischemia. He denies chest pain or shortness of breath.

## 2013-11-10 NOTE — Assessment & Plan Note (Signed)
EF in the 25-30% range. He denies chest pain or shortness of breath. He does have an ICD in place followed by Dr. Sallyanne Kuster last checked in May of this year. He is on appropriate medications.

## 2013-11-17 ENCOUNTER — Ambulatory Visit (HOSPITAL_COMMUNITY)
Admission: RE | Admit: 2013-11-17 | Discharge: 2013-11-17 | Disposition: A | Discharge status: Home / Self Care | Payer: Medicare Other | Source: Ambulatory Visit | Attending: Cardiovascular Disease | Admitting: Cardiovascular Disease

## 2013-11-17 DIAGNOSIS — I2510 Atherosclerotic heart disease of native coronary artery without angina pectoris: Secondary | ICD-10-CM | POA: Insufficient documentation

## 2013-11-17 DIAGNOSIS — I6529 Occlusion and stenosis of unspecified carotid artery: Secondary | ICD-10-CM | POA: Insufficient documentation

## 2013-11-17 DIAGNOSIS — Z9861 Coronary angioplasty status: Secondary | ICD-10-CM | POA: Diagnosis not present

## 2013-11-17 DIAGNOSIS — R9389 Abnormal findings on diagnostic imaging of other specified body structures: Secondary | ICD-10-CM | POA: Diagnosis not present

## 2013-11-17 DIAGNOSIS — Z951 Presence of aortocoronary bypass graft: Secondary | ICD-10-CM | POA: Diagnosis not present

## 2013-11-17 NOTE — Progress Notes (Signed)
Carotid Duplex Completed. °Brianna L Mazza,RVT °

## 2013-11-20 ENCOUNTER — Encounter: Payer: Self-pay | Admitting: Surgery

## 2013-11-20 ENCOUNTER — Encounter: Payer: Self-pay | Admitting: *Deleted

## 2013-11-23 ENCOUNTER — Encounter: Payer: Medicare Other | Admitting: Surgery

## 2013-11-27 ENCOUNTER — Encounter: Payer: Self-pay | Admitting: Surgery

## 2013-11-30 ENCOUNTER — Encounter: Payer: Self-pay | Admitting: Surgery

## 2013-11-30 ENCOUNTER — Ambulatory Visit (INDEPENDENT_AMBULATORY_CARE_PROVIDER_SITE_OTHER): Payer: Medicare Other | Admitting: Surgery

## 2013-11-30 VITALS — BP 143/68 | HR 60 | Ht 68.0 in | Wt 172.8 lb

## 2013-11-30 DIAGNOSIS — I6529 Occlusion and stenosis of unspecified carotid artery: Secondary | ICD-10-CM

## 2013-11-30 NOTE — Progress Notes (Signed)
The patient is back today for followup.  He is status post right carotid stenting with distal embolic protection performed on 10/20/2013.  His procedure was uncomplicated.  He reports no neurologic symptoms today.  He has already had an ultrasound performed a Dr Kennon Holter office which revealed a widely patent carotid stent.  From my perspective the patient is doing very well.  I will let continued followup and surveillance be performed by Dr. Gwenlyn Found.  He is scheduled to see Dr. Erik Obey in a few days regarding his potential tonsillar malignancy.    Annamarie Major

## 2013-12-03 DIAGNOSIS — D3701 Neoplasm of uncertain behavior of lip: Secondary | ICD-10-CM | POA: Diagnosis not present

## 2013-12-03 DIAGNOSIS — D3709 Neoplasm of uncertain behavior of other specified sites of the oral cavity: Secondary | ICD-10-CM | POA: Diagnosis not present

## 2013-12-15 ENCOUNTER — Emergency Department (HOSPITAL_COMMUNITY): Payer: Medicare Other

## 2013-12-15 ENCOUNTER — Inpatient Hospital Stay (HOSPITAL_COMMUNITY)
Admission: EM | Admit: 2013-12-15 | Discharge: 2013-12-17 | DRG: 292 | Disposition: A | Discharge status: Home / Self Care | Payer: Medicare Other | Attending: Internal Medicine | Admitting: Internal Medicine

## 2013-12-15 ENCOUNTER — Encounter (HOSPITAL_COMMUNITY): Payer: Self-pay | Admitting: Emergency Medicine

## 2013-12-15 DIAGNOSIS — Z9889 Other specified postprocedural states: Secondary | ICD-10-CM | POA: Diagnosis not present

## 2013-12-15 DIAGNOSIS — I059 Rheumatic mitral valve disease, unspecified: Secondary | ICD-10-CM | POA: Diagnosis not present

## 2013-12-15 DIAGNOSIS — I5043 Acute on chronic combined systolic (congestive) and diastolic (congestive) heart failure: Secondary | ICD-10-CM

## 2013-12-15 DIAGNOSIS — F329 Major depressive disorder, single episode, unspecified: Secondary | ICD-10-CM | POA: Diagnosis not present

## 2013-12-15 DIAGNOSIS — F1290 Cannabis use, unspecified, uncomplicated: Secondary | ICD-10-CM | POA: Diagnosis present

## 2013-12-15 DIAGNOSIS — J9601 Acute respiratory failure with hypoxia: Secondary | ICD-10-CM | POA: Diagnosis not present

## 2013-12-15 DIAGNOSIS — F1910 Other psychoactive substance abuse, uncomplicated: Secondary | ICD-10-CM

## 2013-12-15 DIAGNOSIS — Z87891 Personal history of nicotine dependence: Secondary | ICD-10-CM

## 2013-12-15 DIAGNOSIS — J440 Chronic obstructive pulmonary disease with acute lower respiratory infection: Secondary | ICD-10-CM | POA: Diagnosis present

## 2013-12-15 DIAGNOSIS — N183 Chronic kidney disease, stage 3 unspecified: Secondary | ICD-10-CM | POA: Diagnosis present

## 2013-12-15 DIAGNOSIS — Z95810 Presence of automatic (implantable) cardiac defibrillator: Secondary | ICD-10-CM | POA: Diagnosis not present

## 2013-12-15 DIAGNOSIS — E1122 Type 2 diabetes mellitus with diabetic chronic kidney disease: Secondary | ICD-10-CM | POA: Diagnosis present

## 2013-12-15 DIAGNOSIS — I447 Left bundle-branch block, unspecified: Secondary | ICD-10-CM | POA: Diagnosis present

## 2013-12-15 DIAGNOSIS — I2510 Atherosclerotic heart disease of native coronary artery without angina pectoris: Secondary | ICD-10-CM | POA: Diagnosis present

## 2013-12-15 DIAGNOSIS — Z951 Presence of aortocoronary bypass graft: Secondary | ICD-10-CM | POA: Diagnosis not present

## 2013-12-15 DIAGNOSIS — I509 Heart failure, unspecified: Secondary | ICD-10-CM | POA: Diagnosis not present

## 2013-12-15 DIAGNOSIS — Z79899 Other long term (current) drug therapy: Secondary | ICD-10-CM | POA: Diagnosis not present

## 2013-12-15 DIAGNOSIS — I252 Old myocardial infarction: Secondary | ICD-10-CM

## 2013-12-15 DIAGNOSIS — I255 Ischemic cardiomyopathy: Secondary | ICD-10-CM | POA: Diagnosis not present

## 2013-12-15 DIAGNOSIS — I6521 Occlusion and stenosis of right carotid artery: Secondary | ICD-10-CM | POA: Diagnosis present

## 2013-12-15 DIAGNOSIS — Z8249 Family history of ischemic heart disease and other diseases of the circulatory system: Secondary | ICD-10-CM | POA: Diagnosis not present

## 2013-12-15 DIAGNOSIS — I129 Hypertensive chronic kidney disease with stage 1 through stage 4 chronic kidney disease, or unspecified chronic kidney disease: Secondary | ICD-10-CM | POA: Diagnosis present

## 2013-12-15 DIAGNOSIS — Z7902 Long term (current) use of antithrombotics/antiplatelets: Secondary | ICD-10-CM | POA: Diagnosis not present

## 2013-12-15 DIAGNOSIS — Z7982 Long term (current) use of aspirin: Secondary | ICD-10-CM | POA: Diagnosis not present

## 2013-12-15 DIAGNOSIS — D61818 Other pancytopenia: Secondary | ICD-10-CM | POA: Diagnosis present

## 2013-12-15 DIAGNOSIS — J209 Acute bronchitis, unspecified: Secondary | ICD-10-CM | POA: Diagnosis not present

## 2013-12-15 DIAGNOSIS — E785 Hyperlipidemia, unspecified: Secondary | ICD-10-CM | POA: Diagnosis present

## 2013-12-15 DIAGNOSIS — M1990 Unspecified osteoarthritis, unspecified site: Secondary | ICD-10-CM | POA: Diagnosis present

## 2013-12-15 DIAGNOSIS — I10 Essential (primary) hypertension: Secondary | ICD-10-CM

## 2013-12-15 DIAGNOSIS — G4700 Insomnia, unspecified: Secondary | ICD-10-CM | POA: Diagnosis not present

## 2013-12-15 DIAGNOSIS — R0602 Shortness of breath: Secondary | ICD-10-CM | POA: Diagnosis not present

## 2013-12-15 LAB — CBC
HCT: 31.2 % — ABNORMAL LOW (ref 39.0–52.0)
Hemoglobin: 10.4 g/dL — ABNORMAL LOW (ref 13.0–17.0)
MCH: 30.4 pg (ref 26.0–34.0)
MCHC: 33.3 g/dL (ref 30.0–36.0)
MCV: 91.2 fL (ref 78.0–100.0)
PLATELETS: 113 10*3/uL — AB (ref 150–400)
RBC: 3.42 MIL/uL — AB (ref 4.22–5.81)
RDW: 14 % (ref 11.5–15.5)
WBC: 2.9 10*3/uL — AB (ref 4.0–10.5)

## 2013-12-15 LAB — BASIC METABOLIC PANEL
ANION GAP: 13 (ref 5–15)
BUN: 33 mg/dL — AB (ref 6–23)
CO2: 17 mEq/L — ABNORMAL LOW (ref 19–32)
Calcium: 9.2 mg/dL (ref 8.4–10.5)
Chloride: 111 mEq/L (ref 96–112)
Creatinine, Ser: 2.13 mg/dL — ABNORMAL HIGH (ref 0.50–1.35)
GFR, EST AFRICAN AMERICAN: 35 mL/min — AB (ref >90)
GFR, EST NON AFRICAN AMERICAN: 30 mL/min — AB (ref >90)
Glucose, Bld: 151 mg/dL — ABNORMAL HIGH (ref 70–99)
POTASSIUM: 4.8 meq/L (ref 3.7–5.3)
SODIUM: 141 meq/L (ref 137–147)

## 2013-12-15 LAB — HEMOGLOBIN A1C
Hgb A1c MFr Bld: 5.8 % — ABNORMAL HIGH (ref <5.7)
Mean Plasma Glucose: 120 mg/dL — ABNORMAL HIGH (ref <117)

## 2013-12-15 LAB — PRO B NATRIURETIC PEPTIDE: PRO B NATRI PEPTIDE: 8210 pg/mL — AB (ref 0–125)

## 2013-12-15 LAB — I-STAT TROPONIN, ED: TROPONIN I, POC: 0.05 ng/mL (ref 0.00–0.08)

## 2013-12-15 LAB — TSH: TSH: 1.47 u[IU]/mL (ref 0.350–4.500)

## 2013-12-15 MED ORDER — MORPHINE SULFATE 2 MG/ML IJ SOLN: 1.0000 mg | INTRAMUSCULAR | Status: DC | PRN

## 2013-12-15 MED ORDER — ONDANSETRON HCL 4 MG/2ML IJ SOLN: 4.0000 mg | Freq: Four times a day (QID) | INTRAMUSCULAR | Status: DC | PRN

## 2013-12-15 MED ORDER — ASPIRIN EC 325 MG PO TBEC
325.0000 mg | DELAYED_RELEASE_TABLET | Freq: Every day | ORAL | Status: DC
  Administered 2013-12-16 – 2013-12-17 (×2): 325 mg via ORAL
  Filled 2013-12-15 (×2): qty 1

## 2013-12-15 MED ORDER — FUROSEMIDE 10 MG/ML IJ SOLN
40.0000 mg | Freq: Every day | INTRAMUSCULAR | Status: DC
  Administered 2013-12-16 – 2013-12-17 (×2): 40 mg via INTRAVENOUS
  Filled 2013-12-15 (×2): qty 4

## 2013-12-15 MED ORDER — IPRATROPIUM-ALBUTEROL 0.5-2.5 (3) MG/3ML IN SOLN
3.0000 mL | Freq: Once | RESPIRATORY_TRACT | Status: AC
  Administered 2013-12-15: 3 mL via RESPIRATORY_TRACT
  Filled 2013-12-15: qty 3

## 2013-12-15 MED ORDER — TRAZODONE HCL 150 MG PO TABS
150.0000 mg | ORAL_TABLET | Freq: Every day | ORAL | Status: DC
  Administered 2013-12-15 – 2013-12-16 (×2): 150 mg via ORAL
  Filled 2013-12-15 (×3): qty 1

## 2013-12-15 MED ORDER — CARVEDILOL 6.25 MG PO TABS
6.2500 mg | ORAL_TABLET | Freq: Two times a day (BID) | ORAL | Status: DC
  Filled 2013-12-15: qty 1

## 2013-12-15 MED ORDER — HEPARIN SODIUM (PORCINE) 5000 UNIT/ML IJ SOLN
5000.0000 [IU] | Freq: Three times a day (TID) | INTRAMUSCULAR | Status: DC
  Administered 2013-12-15 – 2013-12-17 (×5): 5000 [IU] via SUBCUTANEOUS
  Filled 2013-12-15 (×7): qty 1

## 2013-12-15 MED ORDER — FUROSEMIDE 10 MG/ML IJ SOLN
40.0000 mg | Freq: Once | INTRAMUSCULAR | Status: AC
  Administered 2013-12-15: 40 mg via INTRAVENOUS
  Filled 2013-12-15: qty 4

## 2013-12-15 MED ORDER — ALBUTEROL SULFATE (2.5 MG/3ML) 0.083% IN NEBU
2.5000 mg | INHALATION_SOLUTION | Freq: Four times a day (QID) | RESPIRATORY_TRACT | Status: DC
Start: 2013-12-15 — End: 2013-12-16
  Administered 2013-12-15 – 2013-12-16 (×4): 2.5 mg via RESPIRATORY_TRACT
  Filled 2013-12-15 (×5): qty 3

## 2013-12-15 MED ORDER — SODIUM CHLORIDE 0.9 % IJ SOLN
3.0000 mL | Freq: Two times a day (BID) | INTRAMUSCULAR | Status: DC
  Administered 2013-12-15 – 2013-12-17 (×4): 3 mL via INTRAVENOUS

## 2013-12-15 MED ORDER — ACETAMINOPHEN 650 MG RE SUPP
650.0000 mg | Freq: Four times a day (QID) | RECTAL | Status: DC | PRN
Start: 2013-12-15 — End: 2013-12-17

## 2013-12-15 MED ORDER — TRAZODONE 25 MG HALF TABLET: 25.0000 mg | ORAL_TABLET | Freq: Every evening | ORAL | Status: DC | PRN

## 2013-12-15 MED ORDER — OXYCODONE HCL 5 MG PO TABS: 5.0000 mg | ORAL_TABLET | Freq: Three times a day (TID) | ORAL | Status: DC | PRN

## 2013-12-15 MED ORDER — ONDANSETRON HCL 4 MG PO TABS: 4.0000 mg | ORAL_TABLET | Freq: Four times a day (QID) | ORAL | Status: DC | PRN

## 2013-12-15 MED ORDER — IPRATROPIUM BROMIDE 0.02 % IN SOLN
0.5000 mg | Freq: Four times a day (QID) | RESPIRATORY_TRACT | Status: DC
  Administered 2013-12-15 – 2013-12-16 (×4): 0.5 mg via RESPIRATORY_TRACT
  Filled 2013-12-15 (×5): qty 2.5

## 2013-12-15 MED ORDER — GUAIFENESIN ER 600 MG PO TB12
1200.0000 mg | ORAL_TABLET | Freq: Two times a day (BID) | ORAL | Status: DC
  Administered 2013-12-15 – 2013-12-17 (×4): 1200 mg via ORAL
  Filled 2013-12-15 (×5): qty 2

## 2013-12-15 MED ORDER — ATORVASTATIN CALCIUM 40 MG PO TABS
40.0000 mg | ORAL_TABLET | Freq: Every day | ORAL | Status: DC
  Administered 2013-12-15 – 2013-12-16 (×2): 40 mg via ORAL
  Filled 2013-12-15 (×3): qty 1

## 2013-12-15 MED ORDER — LEVOFLOXACIN 750 MG PO TABS
750.0000 mg | ORAL_TABLET | ORAL | Status: DC
  Administered 2013-12-15: 750 mg via ORAL
  Filled 2013-12-15 (×2): qty 1

## 2013-12-15 MED ORDER — OXYCODONE-ACETAMINOPHEN 10-325 MG PO TABS: 1.0000 | ORAL_TABLET | Freq: Three times a day (TID) | ORAL | Status: DC | PRN

## 2013-12-15 MED ORDER — ACETAMINOPHEN 325 MG PO TABS
650.0000 mg | ORAL_TABLET | Freq: Four times a day (QID) | ORAL | Status: DC | PRN
Start: 2013-12-15 — End: 2013-12-17
  Administered 2013-12-15: 650 mg via ORAL
  Filled 2013-12-15: qty 2

## 2013-12-15 MED ORDER — GUAIFENESIN-DM 100-10 MG/5ML PO SYRP
5.0000 mL | ORAL_SOLUTION | ORAL | Status: DC | PRN
Start: 2013-12-15 — End: 2013-12-17

## 2013-12-15 MED ORDER — CARVEDILOL 6.25 MG PO TABS
6.2500 mg | ORAL_TABLET | Freq: Two times a day (BID) | ORAL | Status: DC
  Administered 2013-12-16 – 2013-12-17 (×2): 6.25 mg via ORAL
  Filled 2013-12-15 (×4): qty 1

## 2013-12-15 MED ORDER — AMLODIPINE BESYLATE 10 MG PO TABS
10.0000 mg | ORAL_TABLET | Freq: Every day | ORAL | Status: DC
  Administered 2013-12-16 – 2013-12-17 (×2): 10 mg via ORAL
  Filled 2013-12-15 (×2): qty 1

## 2013-12-15 MED ORDER — CLOPIDOGREL BISULFATE 75 MG PO TABS
75.0000 mg | ORAL_TABLET | Freq: Every day | ORAL | Status: DC
  Administered 2013-12-16 – 2013-12-17 (×2): 75 mg via ORAL
  Filled 2013-12-15 (×2): qty 1

## 2013-12-15 MED ORDER — OXYCODONE-ACETAMINOPHEN 5-325 MG PO TABS: 1.0000 | ORAL_TABLET | Freq: Three times a day (TID) | ORAL | Status: DC | PRN

## 2013-12-15 MED ORDER — HYDRALAZINE HCL 25 MG PO TABS
25.0000 mg | ORAL_TABLET | Freq: Two times a day (BID) | ORAL | Status: DC
  Administered 2013-12-15 – 2013-12-17 (×4): 25 mg via ORAL
  Filled 2013-12-15 (×5): qty 1

## 2013-12-15 NOTE — ED Notes (Signed)
Attempted iv x 2 rt forearm with 22g and 20g each in the vein pt yelling and cursing as needle inserted and pt pulled arm away and iv out x 2 addl rn in to start iv

## 2013-12-15 NOTE — Progress Notes (Signed)
ANTIBIOTIC CONSULT NOTE - INITIAL  Pharmacy Consult for Levaquin Indication: acute broncitis  No Known Allergies  Patient Measurements: Height: 5' 8.5" (174 cm) Weight: 162 lb 4.1 oz (73.6 kg) IBW/kg (Calculated) : 69.55  Vital Signs: Temp: 99.3 F (37.4 C) (10/06 1649) Temp Source: Oral (10/06 1649) BP: 175/82 mmHg (10/06 1649) Pulse Rate: 91 (10/06 1649) Intake/Output from previous day:   Intake/Output from this shift: Total I/O In: -  Out: 350 [Urine:350]  Labs:  Recent Labs  12/15/13 1111  WBC 2.9*  HGB 10.4*  PLT 113*  CREATININE 2.13*   Estimated Creatinine Clearance: 33.1 ml/min (by C-G formula based on Cr of 2.13).    Microbiology: No results found for this or any previous visit (from the past 720 hour(s)).  Medical History: Past Medical History  Diagnosis Date  . Hypertension     2D ECHO, 04/01/2012 - EF 123XX123, systolic function severely reduced, moderate hypokinesis of the inferolateral, inferior, and inferoseptal myocardium  . Diabetes mellitus   . Chronic combined systolic and diastolic CHF, NYHA class 2     NUC, 11/25/2009 - No evidence for a reversible defect or ischemia, inferior wall infarct with hypokinesia along the inferior wall, EF-34%  . S/P CABG x 3   . Bronchitis   . Chest pain at rest 04/01/2012  . Acute respiratory failure, requiring BiPap, now with diuresing improved. 04/01/2012  . At risk for sudden cardiac death 22-Apr-2012  . Cardiomyopathy, ischemic 22-Apr-2012  . Myocardial infarction   . Coronary artery disease   . Dysrhythmia     LBBB  . Chronic kidney disease     STAGE 3   . Carotid artery disease     status post right internal carotid artery stenting 10/20/13  . Automatic implantable cardioverter-defibrillator in situ   . Arthritis   . Hyperlipidemia     Medications:  Prescriptions prior to admission  Medication Sig Dispense Refill  . amLODipine (NORVASC) 10 MG tablet Take 10 mg by mouth daily.      Marland Kitchen aspirin EC 325 MG  tablet Take 325 mg by mouth daily.      Marland Kitchen atorvastatin (LIPITOR) 40 MG tablet Take 40 mg by mouth daily.       . carvedilol (COREG) 6.25 MG tablet Take 6.25 mg by mouth 2 (two) times daily.       . clopidogrel (PLAVIX) 75 MG tablet Take 75 mg by mouth daily.      . furosemide (LASIX) 20 MG tablet Take 20 mg by mouth daily.      . hydrALAZINE (APRESOLINE) 25 MG tablet Take 25 mg by mouth 2 (two) times daily.      Marland Kitchen oxyCODONE-acetaminophen (PERCOCET) 10-325 MG per tablet Take 1 tablet by mouth every 8 (eight) hours as needed for pain.       . potassium chloride SA (K-DUR,KLOR-CON) 20 MEQ tablet Take 20 mEq by mouth 2 (two) times daily.      . traZODone (DESYREL) 150 MG tablet Take 150 mg by mouth at bedtime.       Assessment:  68 y.o. male who presented to ED with cough and shortness of breath. Reported he was in his usual state of health until yesterday afternoon when he developed some SOB, followed by cough and minimal sputum production. Patient had a tough night and couldn't sleep because of the cough. Patient also had fever.  CXR showed no evidence of PNA.  To start Levaquin for bronchitis.  Noted CKD with elevated SCr  2.1 and CrCl ~ 30 ml/min.  Goal of Therapy:  Renal dose adjustment of antibiotics  Plan:  Levaquin 750mg  PO q48h x 3 doses. Software is in place to alert pharmacy of dose adjustments needed based on changes in renal function.  Therefore continued monitoring not needed.  Rx will sign off.   [employer], Pharm.D., BCPS Clinical Pharmacist Pager [phone] 12/15/2013 6:01 PM
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
## 2013-12-15 NOTE — ED Provider Notes (Signed)
Medical screening examination/treatment/procedure(s) were conducted as a shared visit with non-physician practitioner(s) and myself.  I personally evaluated the patient during the encounter.   EKG Interpretation   Date/Time:  Tuesday December 15 2013 10:42:42 EDT Ventricular Rate:  79 PR Interval:  160 QRS Duration: 152 QT Interval:  418 QTC Calculation: 479 R Axis:   120 Text Interpretation:  Sinus rhythm with Premature atrial complexes Right  axis deviation Left bundle branch block Abnormal ECG consistent with old  except slight increase inferior ST depression. abnormal Confirmed by  Johnney Killian, MD, Jeannie Done [phone]) on 12/15/2013 3:02:44 PM       Charlesetta Shanks, MD 12/15/13 1510

## 2013-12-15 NOTE — ED Notes (Signed)
Returned from xray

## 2013-12-15 NOTE — ED Notes (Signed)
Iv therapist in room

## 2013-12-15 NOTE — ED Notes (Signed)
Iv therapist called

## 2013-12-15 NOTE — ED Notes (Signed)
Sob since yesterday. Productive cough (green). Non-reproducible cp.
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
## 2013-12-15 NOTE — ED Notes (Signed)
Clarified order for iv lasix with erin pa will hold until seen by ermd

## 2013-12-16 ENCOUNTER — Encounter (HOSPITAL_COMMUNITY): Payer: Self-pay | Admitting: General Practice

## 2013-12-16 DIAGNOSIS — E785 Hyperlipidemia, unspecified: Secondary | ICD-10-CM

## 2013-12-16 DIAGNOSIS — I059 Rheumatic mitral valve disease, unspecified: Secondary | ICD-10-CM

## 2013-12-16 DIAGNOSIS — I5043 Acute on chronic combined systolic (congestive) and diastolic (congestive) heart failure: Secondary | ICD-10-CM | POA: Diagnosis not present

## 2013-12-16 DIAGNOSIS — I10 Essential (primary) hypertension: Secondary | ICD-10-CM

## 2013-12-16 LAB — URINALYSIS, ROUTINE W REFLEX MICROSCOPIC
Bilirubin Urine: NEGATIVE
Glucose, UA: NEGATIVE mg/dL
Hgb urine dipstick: NEGATIVE
Ketones, ur: NEGATIVE mg/dL
LEUKOCYTES UA: NEGATIVE
Nitrite: NEGATIVE
PROTEIN: 30 mg/dL — AB
Specific Gravity, Urine: 1.008 (ref 1.005–1.030)
Urobilinogen, UA: 0.2 mg/dL (ref 0.0–1.0)
pH: 5 (ref 5.0–8.0)

## 2013-12-16 LAB — BASIC METABOLIC PANEL
ANION GAP: 11 (ref 5–15)
BUN: 32 mg/dL — ABNORMAL HIGH (ref 6–23)
CALCIUM: 9.2 mg/dL (ref 8.4–10.5)
CO2: 21 meq/L (ref 19–32)
Chloride: 108 mEq/L (ref 96–112)
Creatinine, Ser: 1.79 mg/dL — ABNORMAL HIGH (ref 0.50–1.35)
GFR calc Af Amer: 43 mL/min — ABNORMAL LOW (ref >90)
GFR calc non Af Amer: 38 mL/min — ABNORMAL LOW (ref >90)
Glucose, Bld: 88 mg/dL (ref 70–99)
Potassium: 4.2 mEq/L (ref 3.7–5.3)
SODIUM: 140 meq/L (ref 137–147)

## 2013-12-16 LAB — URINE MICROSCOPIC-ADD ON

## 2013-12-16 LAB — RAPID URINE DRUG SCREEN, HOSP PERFORMED
Amphetamines: NOT DETECTED
Barbiturates: NOT DETECTED
Benzodiazepines: NOT DETECTED
Cocaine: NOT DETECTED
Opiates: NOT DETECTED
Tetrahydrocannabinol: POSITIVE — AB

## 2013-12-16 LAB — CBC
HCT: 30.8 % — ABNORMAL LOW (ref 39.0–52.0)
Hemoglobin: 10.3 g/dL — ABNORMAL LOW (ref 13.0–17.0)
MCH: 30.7 pg (ref 26.0–34.0)
MCHC: 33.4 g/dL (ref 30.0–36.0)
MCV: 91.7 fL (ref 78.0–100.0)
Platelets: 100 10*3/uL — ABNORMAL LOW (ref 150–400)
RBC: 3.36 MIL/uL — ABNORMAL LOW (ref 4.22–5.81)
RDW: 14 % (ref 11.5–15.5)
WBC: 2.1 10*3/uL — ABNORMAL LOW (ref 4.0–10.5)

## 2013-12-16 MED ORDER — INFLUENZA VAC SPLIT QUAD 0.5 ML IM SUSY
0.5000 mL | PREFILLED_SYRINGE | INTRAMUSCULAR | Status: AC
  Administered 2013-12-16: 0.5 mL via INTRAMUSCULAR
  Filled 2013-12-16: qty 0.5

## 2013-12-16 MED ORDER — IPRATROPIUM-ALBUTEROL 0.5-2.5 (3) MG/3ML IN SOLN
3.0000 mL | Freq: Three times a day (TID) | RESPIRATORY_TRACT | Status: DC
  Administered 2013-12-17: 3 mL via RESPIRATORY_TRACT
  Filled 2013-12-16 (×2): qty 3

## 2013-12-16 NOTE — Progress Notes (Signed)
[facility] PROGRESS NOTE Interim History: 68 y.o. male with past medical history of combined CHF, status post CABG x3, ischemic cardiomyopathy and LBBB status post AICD . Patient given to the hospital because of cough and shortness of breath. Reported he was in his usual state of health until yesterday afternoon when he developed some SOB, followed by cough and minimal sputum production. Patient had a tough night and couldn't sleep because of the cough. Patient also had fever.   Filed Weights   12/15/13 1649 12/16/13 0528  Weight: 73.6 kg (162 lb 4.1 oz) 71.532 kg (157 lb 11.2 oz)        Intake/Output Summary (Last 24 hours) at 12/16/13 1925 Last data filed at 12/16/13 [id number]  Gross per 24 hour  Intake   1480 ml  Output   2065 ml  Net   -585 ml     Assessment/Plan: Acute on chronic CHF exacerbation: combined diastolic and systolic CHF -continue IV lasix -continue daily weights and strict intake and output -2-D echo showing improvement on EF (last one EF 20-30%) -continue b-blocker -no ACE/ARB's due to renal failure  Acute bronchitis: will continue tx with levaquin -wean oxygen as needed  CKD (chronic kidney disease) stage 3, GFR 30-59 ml/min -stable and at baseline -will monitor closely with ongoing diuresis  Pancytopenia: most likely due to alcohol intake. -no signs of bleeding currently -Hgb stable -will monitor  Polysubstance abuse: cessation counseling provided  HLD: continue statins  Depression/insomnia: continue trazodone      Code Status: full  Family Communication: no family at bedside Disposition Plan: home when medically stable; no further fever, volume stable and breathing improved.   Consultants:  None   Procedures: ECHO: 12/16/13 - Left ventricle: There is a false tendon in the LV apex of no clinical significance. The cavity size was normal. There was mild concentric hypertrophy. Systolic function was mildly to moderately reduced.  The estimated ejection fraction was in the range of 40% to 45%. There is akinesis of the basalinferoseptal myocardium. There is akinesis of the basal-midinferior myocardium. There is hypokinesis of the apical myocardium. - Ventricular septum: Septal motion showed paradox. - Aortic valve: Mild diffuse calcification, consistent with sclerosis. There was trivial regurgitation. - Mitral valve: There was mild regurgitation. - Left atrium: The atrium was mildly dilated. - Right ventricle: The cavity size was mildly dilated. Wall thickness was normal. - Tricuspid valve: There was mild regurgitation. - Pulmonary arteries: PA peak pressure: 59 mm Hg (S).  Impressions:  - The right ventricular systolic pressure was increased consistent with moderate pulmonary hypertension.  Antibiotics:  levaquin   HPI/Subjective: Low grade fever and cough appreciated during interview. Patient denies CP and reports symptoms are better.  Objective: Filed Vitals:   12/16/13 0528 12/16/13 0959 12/16/13 1013 12/16/13 1341  BP: 135/69 138/69  138/66  Pulse: 64 75  90  Temp: 98.4 F (36.9 C) 98.8 F (37.1 C)  98.7 F (37.1 C)  TempSrc: Oral Oral  Oral  Resp: 18 20  18   Height:      Weight: 71.532 kg (157 lb 11.2 oz)     SpO2: 100% 100% 98% 100%     Exam:  General: Alert, awake, oriented x3, in no acute distress. Reports he is feeling better. Low grade fever overnight and still coughing and requiring O2 supplementation  HEENT: No bruits, no goiter. No JVD Heart: Regular rate, no rubs or gallops. No LE edema  Lungs: fair air movement, no wheezing; decrease BS  at bases, fine crackles Abdomen: Soft, nontender, nondistended, positive bowel sounds.  Neuro: Grossly intact, nonfocal.   Data Reviewed: Basic Metabolic Panel:  Recent Labs Lab 12/15/13 1111 12/16/13 0458  NA 141 140  K 4.8 4.2  CL 111 108  CO2 17* 21  GLUCOSE 151* 88  BUN 33* 32*  CREATININE 2.13* 1.79*  CALCIUM 9.2 9.2    CBC:  Recent Labs Lab 12/15/13 1111 12/16/13 0458  WBC 2.9* 2.1*  HGB 10.4* 10.3*  HCT 31.2* 30.8*  MCV 91.2 91.7  PLT 113* 100*   BNP (last 3 results)  Recent Labs  12/15/13 1111  PROBNP 8210.0*   Studies: Dg Chest 2 View  12/15/2013   CLINICAL DATA:  Short of breath  EXAM: CHEST  2 VIEW  COMPARISON:  06/01/2012  FINDINGS: Stable left subclavian AICD. Upper normal heart size. Normal vascularity. Chronic interstitial and bronchitic changes. No evidence of pulmonary edema. No pleural effusion or pneumothorax.  IMPRESSION: No active cardiopulmonary disease.   Electronically Signed   By: Maryclare Bean M.D.   On: 12/15/2013 13:07    Scheduled Meds: . albuterol  2.5 mg Nebulization Q6H  . amLODipine  10 mg Oral Daily  . aspirin EC  325 mg Oral Daily  . atorvastatin  40 mg Oral QHS  . carvedilol  6.25 mg Oral BID WC  . clopidogrel  75 mg Oral Daily  . furosemide  40 mg Intravenous Daily  . guaiFENesin  1,200 mg Oral BID  . heparin  5,000 Units Subcutaneous 3 times per day  . hydrALAZINE  25 mg Oral BID  . ipratropium  0.5 mg Nebulization Q6H  . levofloxacin  750 mg Oral Q48H  . sodium chloride  3 mL Intravenous Q12H  . traZODone  150 mg Oral QHS   Continuous Infusions:   Time: > 30 minutes  Barton Dubois  [facility] Pager [phone]. If 8PM-8AM, please contact night-coverage at [email or web address], password [facility] 12/16/2013, 7:25 PM  LOS: 1 day

## 2013-12-16 NOTE — Progress Notes (Signed)
  Echocardiogram 2D Echocardiogram has been performed.  [name] FRANCES 12/16/2013, 3:11 PM

## 2013-12-16 NOTE — Evaluation (Signed)
Physical Therapy Evaluation Patient Details Name: [name] MRN: [id number] DOB: Jun 13, 1945 Today's Date: 12/16/2013   History of Present Illness  [name] is a 68 y.o. male with past medical history of combined CHF, status post CABG x3, ischemic cardiomyopathy and LBBB status post AICD . Patient given to the hospital because of cough and shortness of breath.  Patient chest x-ray showed no active cardiopulmonary disease. Dx of bronchitis in the setting of CHF exacerbation.  Also has polysubstance abuse, CKD stage III and semi compliance with medical instructions.  Clinical Impression  Pt was seen after admission with observation of compliance with PT but dietary service came in and pt argued with him.  Pt apparently has been semi compliant with instructions, but is also asking for a new cane in therapy.  Will follow for mobility in hospital but not recommending follow up at home.    Follow Up Recommendations No PT follow up    Equipment Recommendations  Cane    Recommendations for Other Services  (none)     Precautions / Restrictions Precautions Precautions: None Restrictions Weight Bearing Restrictions: No      Mobility  Bed Mobility Overal bed mobility: Modified Independent                Transfers Overall transfer level: Modified independent Equipment used:  (Used cart for O2 as a minor stabilizer as his cane is damage)                Ambulation/Gait Ambulation/Gait assistance: Supervision Ambulation Distance (Feet): 250 Feet Assistive device:  (cart with O2 as minor stabilizer) Gait Pattern/deviations: Decreased weight shift to right;Decreased stance time - right;Step-through pattern;Decreased step length - right;Decreased step length - left;Wide base of support Gait velocity: reduced Gait velocity interpretation: Below normal speed for age/gender General Gait Details: No signs of SOB during gait, used cart as a simple support with more a Mudlogger  than wbing support  Stairs            Wheelchair Mobility    Modified Rankin (Stroke Patients Only)       Balance Overall balance assessment: Modified Independent                                           Pertinent Vitals/Pain Pain Assessment: No/denies pain BP was 135/69, pulse 64 and O2 sat 100% on 4L O2 via nasal cannula.    Home Living Family/patient expects to be discharged to:: Private residence Living Arrangements: Alone   Type of Home: Apartment Home Access: Level entry     Home Layout: One [place]: [place] - single point;Other (comment) (Cane in [place]) Additional Comments: Pt asks for a new cane    Prior Function Level of Independence: Independent with assistive device(s)         Comments: Using public transportation for errands and appointments     Hand Dominance        Extremity/Trunk Assessment   Upper Extremity Assessment: Overall WFL for tasks assessed           Lower Extremity Assessment: Generalized weakness      Cervical / Trunk Assessment: Normal  Communication   Communication: No difficulties  Cognition Arousal/Alertness: Awake/alert Behavior During Therapy: WFL for tasks assessed/performed Overall Cognitive Status: Within Functional Limits for tasks assessed  General Comments General comments (skin integrity, edema, etc.): No signs of edema, no SOB with exertion    Exercises General Exercises - Lower Extremity Long Arc Quad: Strengthening;Both;10 reps Hip ABduction/ADduction: Strengthening;Both;10 reps      Assessment/Plan    PT Assessment Patient needs continued PT services  PT Diagnosis Generalized weakness   PT Problem List Decreased range of motion;Decreased activity tolerance;Decreased mobility;Cardiopulmonary status limiting activity  PT Treatment Interventions Gait training;Stair training;Therapeutic activities;Therapeutic exercise;Functional  mobility training;Patient/family education   PT Goals (Current goals can be found in the Care Plan section) Acute Rehab PT Goals Patient Stated Goal: To go home and get something to eat PT Goal Formulation: With patient Time For Goal Achievement: 12/23/13 Potential to Achieve Goals: Good    Frequency Min 2X/week   Barriers to discharge Decreased caregiver support Home alone and uses public transport    Co-evaluation               End of Session Equipment Utilized During Treatment: Oxygen Activity Tolerance: Patient tolerated treatment well Patient left: in chair;with call bell/phone within reach Nurse Communication: Mobility status         Time: [id number] PT Time Calculation (min): 28 min   Charges:   PT Evaluation $Initial PT Evaluation Tier I: 1 Procedure PT Treatments $Gait Training: 8-22 mins   PT G CodesRamond Dial Jan 11, 2014, 9:55 AM Mee Hives, PT MS Acute Rehab Dept. Number: [id number]

## 2013-12-16 NOTE — Care Management Note (Signed)
    Page 1 of 1   12/16/2013     3:28:52 PM CARE MANAGEMENT NOTE 12/16/2013  Patient:  [name], [name]   Account Number:  [id number]  Date Initiated:  12/16/2013  Documentation initiated by:  Fuller Mandril  Subjective/Objective Assessment:   68 y.o. male with past medical history of combined CHF, status post CABG x3, ischemic cardiomyopathy and LBBB status post AICD . Patient given to the hospital because of cough and SOB.//Home alone.     Action/Plan:   IV abx, lasix...//Access for disposition needs.   Anticipated DC Date:  12/19/2013   Anticipated DC Plan:           Choice offered to / List presented to:             Status of service:  In process, will continue to follow Medicare Important Message given?   (If response is "NO", the following Medicare IM given date fields will be blank) Date Medicare IM given:   Medicare IM given by:   Date Additional Medicare IM given:   Additional Medicare IM given by:    Discharge Disposition:    Per UR Regulation:  Reviewed for med. necessity/level of care/duration of stay  If discussed at [place] of Stay Meetings, dates discussed:    Comments:

## 2013-12-17 DIAGNOSIS — J9601 Acute respiratory failure with hypoxia: Secondary | ICD-10-CM

## 2013-12-17 LAB — URINE CULTURE
CULTURE: NO GROWTH
Colony Count: NO GROWTH

## 2013-12-17 MED ORDER — FUROSEMIDE 20 MG PO TABS: 40.0000 mg | ORAL_TABLET | Freq: Every day | ORAL | Status: DC

## 2013-12-17 MED ORDER — IPRATROPIUM-ALBUTEROL 20-100 MCG/ACT IN AERS: 1.0000 | INHALATION_SPRAY | Freq: Four times a day (QID) | RESPIRATORY_TRACT | Status: DC | PRN

## 2013-12-17 MED ORDER — LEVOFLOXACIN 750 MG PO TABS
750.0000 mg | ORAL_TABLET | ORAL | Status: DC
Start: 2013-12-17 — End: 2014-06-10

## 2013-12-17 MED ORDER — GUAIFENESIN ER 600 MG PO TB12: 600.0000 mg | ORAL_TABLET | Freq: Two times a day (BID) | ORAL | Status: DC

## 2013-12-17 NOTE — Discharge Summary (Signed)
Physician Discharge Summary  [name] [id number] DOB: [birth year] DOA: 12/15/2013  PCP: Wenda Low, MD  Admit date: 12/15/2013 Discharge date: 12/17/2013  Time spent: >30 minutes  Recommendations for Outpatient Follow-up:  1. Check BMET to follow electrolytes and renal function 2. Please reassess BP and if kidney function stable start low dose ACE/ARB 3. Arrange follow up visit with pulmonary service for PFT's  BNP    Component Value Date/Time   PROBNP 8210.0* 12/15/2013 1111   Filed Weights   12/15/13 1649 12/16/13 0528 12/17/13 0524  Weight: 73.6 kg (162 lb 4.1 oz) 71.532 kg (157 lb 11.2 oz) 70.6 kg (155 lb 10.3 oz)     Discharge Diagnoses:  Acute bronchitis Polysubstance abuse CKD (chronic kidney disease) stage 3, GFR 30-59 ml/min Acute on chronic combined systolic and diastolic CHF, NYHA class 3 HTN HLD Diabetes type 2 Pancytopenia   Discharge Condition: stable and improved. Will discharge home with follow with PCP in 1 wek  Diet recommendation: low sodium and low carbohydrates diet   History of present illness:  68 y.o. male with past medical history of combined CHF, status post CABG x3, ischemic cardiomyopathy and LBBB status post AICD . Patient given to the hospital because of cough and shortness of breath. Reported he was in his usual state of health until yesterday afternoon when he developed some SOB, followed by cough and minimal sputum production. Patient had a tough night and couldn't sleep because of the cough. Admitted by [facility] for further evaluation and treatment on his SOB.   Hospital Course:  Acute on chronic CHF exacerbation: combined diastolic and systolic CHF  -stable and compensated at discharge -patient w/o significant SOB and just intermittent cough from COPD/bronchiectasis -will discharge on lasix 40mg  daily, low sodium diet, daily weights and fluid restrictions  -2-D echo showing improvement on EF (last one EF 20-30%) and during this  admission 40-45%  -continue b-blocker and nitrates -no ACE/ARB's due to renal failure; if Cr stabilizes will benefit of low dose ACE down the road. -continue mucinex and PRN combivent   Acute bronchiectasis/bronchitis: will continue tx with levaquin  -wheezing, no fever and good air movement -will benefit of PFT's as an outpatient  CKD (chronic kidney disease) stage 3, GFR 30-59 ml/min  -stable and at baseline  -will require close monitoring as an outpatient, especially with increase dose of lasix   Pancytopenia: most likely due to alcohol intake.  -no signs of bleeding currently  -Hgb stable  -will monitor CBC for cells trend  Polysubstance abuse: cessation counseling provided   HLD: continue statins   Depression/insomnia: continue trazodone  Type 2 diabetes; with renal failure: controlled -A1C 5.8 -continue low carbohydrates diet   Procedures: 2-D echo: 12/15/2013 - Left ventricle: There is a false tendon in the LV apex of no clinical significance. The cavity size was normal. There was mild concentric hypertrophy. Systolic function was mildly to moderately reduced. The estimated ejection fraction was in the range of 40% to 45%. There is akinesis of the basalinferoseptal myocardium. There is akinesis of the basal-midinferior myocardium. There is hypokinesis of the apical myocardium. - Ventricular septum: Septal motion showed paradox. - Aortic valve: Mild diffuse calcification, consistent with sclerosis. There was trivial regurgitation. - Mitral valve: There was mild regurgitation. - Left atrium: The atrium was mildly dilated. - Right ventricle: The cavity size was mildly dilated. Wall thickness was normal. - Tricuspid valve: There was mild regurgitation. - Pulmonary arteries: PA peak pressure: 59 mm Hg (S).  Impressions:  -  The right ventricular systolic pressure was increased consistent with moderate pulmonary hypertension.  Consultations:  None   Discharge  Exam: Filed Vitals:   12/17/13 0524  BP: 144/69  Pulse: 65  Temp: 98.4 F (36.9 C)  Resp: 20    General: Alert, awake, oriented x3, in no acute distress. Reports he is feeling better and breathing a lot better. Still with some intermittent cough, but with good O2 sat on RA. Afebrile.  HEENT: No bruits, no goiter. No JVD  Heart: Regular rate, no rubs or gallops. No LE edema  Lungs: improved air movement, no wheezing; No crackles  Abdomen: Soft, nontender, nondistended, positive bowel sounds.  Neuro: Grossly intact, nonfocal.   Discharge Instructions  Discharge Instructions   Diet - low sodium heart healthy    Complete by:  As directed      Discharge instructions    Complete by:  As directed   Follow low sodium diet (less than 2 grams) Take medications as prescribed Stop smoking Arrange follow up with PCP in 1 week Check weight on daily basis            Medication List         amLODipine 10 MG tablet  Commonly known as:  NORVASC  Take 10 mg by mouth daily.     aspirin EC 325 MG tablet  Take 325 mg by mouth daily.     atorvastatin 40 MG tablet  Commonly known as:  LIPITOR  Take 40 mg by mouth daily.     carvedilol 6.25 MG tablet  Commonly known as:  COREG  Take 6.25 mg by mouth 2 (two) times daily.     clopidogrel 75 MG tablet  Commonly known as:  PLAVIX  Take 75 mg by mouth daily.     furosemide 20 MG tablet  Commonly known as:  LASIX  Take 2 tablets (40 mg total) by mouth daily.     guaiFENesin 600 MG 12 hr tablet  Commonly known as:  MUCINEX  Take 1 tablet (600 mg total) by mouth 2 (two) times daily.     hydrALAZINE 25 MG tablet  Commonly known as:  APRESOLINE  Take 25 mg by mouth 2 (two) times daily.     Ipratropium-Albuterol 20-100 MCG/ACT Aers respimat  Commonly known as:  COMBIVENT  Inhale 1 puff into the lungs every 6 (six) hours as needed for wheezing or shortness of breath.     levofloxacin 750 MG tablet  Commonly known as:  LEVAQUIN    Take 1 tablet (750 mg total) by mouth every other day.     oxyCODONE-acetaminophen 10-325 MG per tablet  Commonly known as:  PERCOCET  Take 1 tablet by mouth every 8 (eight) hours as needed for pain.     potassium chloride SA 20 MEQ tablet  Commonly known as:  K-DUR,KLOR-CON  Take 20 mEq by mouth 2 (two) times daily.     traZODone 150 MG tablet  Commonly known as:  DESYREL  Take 150 mg by mouth at bedtime.       No Known Allergies     Follow-up Information   Follow up with HUSAIN,KARRAR, MD. Schedule an appointment as soon as possible for a visit in 1 week.   Specialty:  Internal Medicine   Contact information:   [address] [place] [address] [phone]       The results of significant diagnostics from this hospitalization (including imaging, microbiology, ancillary and laboratory) are listed  below for reference.    Significant Diagnostic Studies: Dg Chest 2 View  12/15/2013   CLINICAL DATA:  Short of breath  EXAM: CHEST  2 VIEW  COMPARISON:  06/01/2012  FINDINGS: Stable left subclavian AICD. Upper normal heart size. Normal vascularity. Chronic interstitial and bronchitic changes. No evidence of pulmonary edema. No pleural effusion or pneumothorax.  IMPRESSION: No active cardiopulmonary disease.   Electronically Signed   By: Maryclare Bean M.D.   On: 12/15/2013 13:07    Microbiology: Recent Results (from the past 240 hour(s))  URINE CULTURE     Status: None   Collection Time    12/16/13 12:48 PM      Result Value Ref Range Status   Specimen Description URINE, CLEAN CATCH   Final   Special Requests NONE   Final   Culture  Setup Time     Final   Value: 12/16/2013 17:24     Performed at [facility]     Final   Value: NO GROWTH     Performed at [facility]   Culture     Final   Value: NO GROWTH     Performed at [facility]   Report Status 12/17/2013 FINAL   Final     Labs: Basic Metabolic  Panel:  Recent Labs Lab 12/15/13 1111 12/16/13 0458  NA 141 140  K 4.8 4.2  CL 111 108  CO2 17* 21  GLUCOSE 151* 88  BUN 33* 32*  CREATININE 2.13* 1.79*  CALCIUM 9.2 9.2   CBC:  Recent Labs Lab 12/15/13 1111 12/16/13 0458  WBC 2.9* 2.1*  HGB 10.4* 10.3*  HCT 31.2* 30.8*  MCV 91.2 91.7  PLT 113* 100*   BNP (last 3 results)  Recent Labs  12/15/13 1111  PROBNP 8210.0*   Signed:  Barton Dubois  [facility] 12/17/2013, 12:50 PM

## 2013-12-22 NOTE — ED Provider Notes (Addendum)
Medical screening examination/treatment/procedure(s) were conducted as a shared visit with non-physician practitioner(s) and myself.  I personally evaluated the patient during the encounter.   EKG Interpretation   Date/Time:  Tuesday December 15 2013 10:42:42 EDT Ventricular Rate:  79 PR Interval:  160 QRS Duration: 152 QT Interval:  418 QTC Calculation: 479 R Axis:   120 Text Interpretation:  Sinus rhythm with Premature atrial complexes Right  axis deviation Left bundle branch block Abnormal ECG consistent with old  except slight increase inferior ST depression. abnormal Confirmed by  Johnney Killian, MD, Jeannie Done [phone]) on 12/15/2013 3:02:44 PM       Charlesetta Shanks, MD 12/22/13 0132 The patient presents with increasing shortness of breath. On examination he has coarse Rales. The BNP has elevated. The patient was admitted for CHF exacerbation and treated with Lasix in the emergency department. I agree with the plan.  Charlesetta Shanks, MD 01/30/14 [phone]

## 2013-12-23 ENCOUNTER — Encounter: Payer: Self-pay | Admitting: Cardiology

## 2013-12-24 DIAGNOSIS — Z23 Encounter for immunization: Secondary | ICD-10-CM | POA: Diagnosis not present

## 2013-12-24 DIAGNOSIS — M542 Cervicalgia: Secondary | ICD-10-CM | POA: Diagnosis not present

## 2013-12-24 DIAGNOSIS — J209 Acute bronchitis, unspecified: Secondary | ICD-10-CM | POA: Diagnosis not present

## 2013-12-24 DIAGNOSIS — I252 Old myocardial infarction: Secondary | ICD-10-CM | POA: Diagnosis not present

## 2013-12-24 DIAGNOSIS — D696 Thrombocytopenia, unspecified: Secondary | ICD-10-CM | POA: Diagnosis not present

## 2013-12-24 DIAGNOSIS — I5032 Chronic diastolic (congestive) heart failure: Secondary | ICD-10-CM | POA: Diagnosis not present

## 2013-12-24 DIAGNOSIS — I10 Essential (primary) hypertension: Secondary | ICD-10-CM | POA: Diagnosis not present

## 2013-12-24 DIAGNOSIS — N183 Chronic kidney disease, stage 3 (moderate): Secondary | ICD-10-CM | POA: Diagnosis not present

## 2013-12-24 DIAGNOSIS — D61818 Other pancytopenia: Secondary | ICD-10-CM | POA: Diagnosis not present

## 2013-12-28 ENCOUNTER — Telehealth: Payer: Self-pay | Admitting: Cardiology

## 2013-12-28 ENCOUNTER — Ambulatory Visit (INDEPENDENT_AMBULATORY_CARE_PROVIDER_SITE_OTHER): Payer: Medicare Other | Admitting: *Deleted

## 2013-12-28 ENCOUNTER — Encounter: Payer: Self-pay | Admitting: Cardiovascular Disease

## 2013-12-28 DIAGNOSIS — I255 Ischemic cardiomyopathy: Secondary | ICD-10-CM | POA: Diagnosis not present

## 2013-12-28 DIAGNOSIS — I509 Heart failure, unspecified: Secondary | ICD-10-CM | POA: Diagnosis not present

## 2013-12-28 NOTE — Progress Notes (Signed)
Remote ICD transmission.   

## 2013-12-28 NOTE — Telephone Encounter (Signed)
Pt needed help send transmission. I walked pt through transmission.

## 2013-12-30 LAB — MDC_IDC_ENUM_SESS_TYPE_REMOTE
Battery Remaining Longevity: 112 mo
Brady Statistic AP VP Percent: 0.71 %
Brady Statistic AS VS Percent: 72.32 %
Brady Statistic RV Percent Paced: 0.73 %
Date Time Interrogation Session: 20151019184502
HighPow Impedance: 58 Ohm
Lead Channel Impedance Value: 399 Ohm
Lead Channel Impedance Value: 418 Ohm
Lead Channel Pacing Threshold Amplitude: 0.625 V
Lead Channel Pacing Threshold Pulse Width: 0.4 ms
Lead Channel Pacing Threshold Pulse Width: 0.4 ms
Lead Channel Sensing Intrinsic Amplitude: 1.875 mV
Lead Channel Sensing Intrinsic Amplitude: 1.875 mV
Lead Channel Sensing Intrinsic Amplitude: 20.375 mV
Lead Channel Setting Pacing Amplitude: 2 V
Lead Channel Setting Sensing Sensitivity: 0.3 mV
MDC IDC MSMT BATTERY VOLTAGE: 3.01 V
MDC IDC MSMT LEADCHNL RA PACING THRESHOLD AMPLITUDE: 0.625 V
MDC IDC MSMT LEADCHNL RV IMPEDANCE VALUE: 361 Ohm
MDC IDC MSMT LEADCHNL RV SENSING INTR AMPL: 20.375 mV
MDC IDC SET LEADCHNL RA PACING AMPLITUDE: 1.5 V
MDC IDC SET LEADCHNL RV PACING PULSEWIDTH: 0.4 ms
MDC IDC STAT BRADY AP VS PERCENT: 26.95 %
MDC IDC STAT BRADY AS VP PERCENT: 0.02 %
MDC IDC STAT BRADY RA PERCENT PACED: 27.66 %
Zone Setting Detection Interval: 300 ms
Zone Setting Detection Interval: 350 ms
Zone Setting Detection Interval: 360 ms
Zone Setting Detection Interval: 370 ms

## 2014-01-04 ENCOUNTER — Encounter: Payer: Self-pay | Admitting: Cardiology

## 2014-01-04 ENCOUNTER — Ambulatory Visit (INDEPENDENT_AMBULATORY_CARE_PROVIDER_SITE_OTHER): Payer: Medicare Other | Admitting: Cardiology

## 2014-01-04 VITALS — BP 132/68 | HR 72 | Ht 68.0 in | Wt 163.7 lb

## 2014-01-04 DIAGNOSIS — I2510 Atherosclerotic heart disease of native coronary artery without angina pectoris: Secondary | ICD-10-CM

## 2014-01-04 DIAGNOSIS — I255 Ischemic cardiomyopathy: Secondary | ICD-10-CM | POA: Diagnosis not present

## 2014-01-04 DIAGNOSIS — N183 Chronic kidney disease, stage 3 unspecified: Secondary | ICD-10-CM

## 2014-01-04 DIAGNOSIS — I5043 Acute on chronic combined systolic (congestive) and diastolic (congestive) heart failure: Secondary | ICD-10-CM | POA: Diagnosis not present

## 2014-01-04 DIAGNOSIS — Z8679 Personal history of other diseases of the circulatory system: Secondary | ICD-10-CM

## 2014-01-04 DIAGNOSIS — Z95810 Presence of automatic (implantable) cardiac defibrillator: Secondary | ICD-10-CM

## 2014-01-04 NOTE — Assessment & Plan Note (Signed)
S/P Carotid stent by Dr Gwenlyn Found Aug 2015

## 2014-01-04 NOTE — Assessment & Plan Note (Signed)
Discharged 12/17/13 after CHF exacerbation

## 2014-01-04 NOTE — Assessment & Plan Note (Signed)
CABG '06, low risk Myoview June 2015

## 2014-01-04 NOTE — Assessment & Plan Note (Signed)
EF improved to 40-45% by echo 12/16/13

## 2014-01-04 NOTE — Assessment & Plan Note (Signed)
SCr 1.7

## 2014-01-04 NOTE — Progress Notes (Signed)
01/04/2014 [name]   11-25-1945  [id number]  Primary Physicia John Low, MD Primary Cardiologist: Dr [name]  HPI:  68 year old thin-appearing African American male history of CAD status post bypass grafting x3 in 2006 with a LIMA to his LAD, vein to circumflex and PDA. His other problems include COPD with tobacco abuse, diabetes, hypertension, left bundle branch block and chronic kidney disease. He had an BiV ICD placed in February 2014 and is followed by Dr. Sallyanne Richardson in our office. Carotid Doppler in June 2015  suggested a high-grade right internal carotid artery stenosis. Myoview stress test performed 09/02/13 showed no ischemia.  Dr [name] placed a carotid stent  10/20/13. The patient has been well since. He is on dual antiplatelet therapy.          He is in the office today after a recent admission for acute CHF. His EF has improved- 40-45% by echo  12/16/13. The etiology of his CHF exacerbation is not clear. He denies any excess sodium intake. Since discharge he has done well. He denies any chest pain or significant dyspnea.    Current Outpatient Prescriptions  Medication Sig Dispense Refill  . amLODipine (NORVASC) 10 MG tablet Take 10 mg by mouth daily.      Marland Kitchen aspirin EC 325 MG tablet Take 325 mg by mouth daily.      Marland Kitchen atorvastatin (LIPITOR) 40 MG tablet Take 40 mg by mouth daily.       . carisoprodol (SOMA) 350 MG tablet Take 350 mg by mouth 3 (three) times daily.      . carvedilol (COREG) 6.25 MG tablet Take 6.25 mg by mouth 2 (two) times daily.       . clopidogrel (PLAVIX) 75 MG tablet Take 75 mg by mouth daily.      . furosemide (LASIX) 20 MG tablet Take 2 tablets (40 mg total) by mouth daily.  30 tablet  1  . guaiFENesin (MUCINEX) 600 MG 12 hr tablet Take 1 tablet (600 mg total) by mouth 2 (two) times daily.  40 tablet  0  . hydrALAZINE (APRESOLINE) 25 MG tablet Take 25 mg by mouth 2 (two) times daily.      . Ipratropium-Albuterol (COMBIVENT) 20-100 MCG/ACT AERS respimat  Inhale 1 puff into the lungs every 6 (six) hours as needed for wheezing or shortness of breath.  1 Inhaler  3  . levofloxacin (LEVAQUIN) 750 MG tablet Take 1 tablet (750 mg total) by mouth every other day.  7 tablet  0  . oxyCODONE-acetaminophen (PERCOCET) 10-325 MG per tablet Take 1 tablet by mouth every 8 (eight) hours as needed for pain.       . potassium chloride SA (K-DUR,KLOR-CON) 20 MEQ tablet Take 20 mEq by mouth 2 (two) times daily.      . traMADol (ULTRAM) 50 MG tablet Take 1 tablet by mouth 3 (three) times daily.      . traZODone (DESYREL) 150 MG tablet Take 150 mg by mouth at bedtime.       No current facility-administered medications for this visit.    No Known Allergies  History   Social History  . Marital Status: Divorced    Spouse Name: N/A    Number of Children: N/A  . Years of Education: N/A   Occupational History  . Not on file.   Social History Main Topics  . Smoking status: Former Smoker    Quit date: 04/18/1982  . Smokeless tobacco: Never Used  . Alcohol Use:  No  . Drug Use: 7.00 per week    Special: Marijuana  . Sexual Activity: Not on file   Other Topics Concern  . Not on file   Social History Narrative  . No narrative on file     Review of Systems: General: negative for chills, fever, night sweats or weight changes.  Cardiovascular: negative for chest pain, dyspnea on exertion, edema, orthopnea, palpitations, paroxysmal nocturnal dyspnea or shortness of breath Dermatological: negative for rash Respiratory: negative for cough or wheezing Urologic: negative for hematuria Abdominal: negative for nausea, vomiting, diarrhea, bright red blood per rectum, melena, or hematemesis Neurologic: negative for visual changes, syncope, or dizziness Hx of tonsillar cancer- Dr [name] He has had headaches that sound like M/S neck pain All other systems reviewed and are otherwise negative except as noted above.    Blood pressure 132/68, pulse 72, height 5\' 8"   (1.727 m), weight 163 lb 11.2 oz (74.254 kg).  General appearance: alert, cooperative and no distress Neck: no adenopathy, no JVD and carotid bruit noted bilat Lungs: clear to auscultation bilaterally Heart: regular rate and rhythm   ASSESSMENT AND PLAN:   Acute on chronic combined systolic and diastolic CHF, NYHA class 3 Discharged 12/17/13 after CHF exacerbation  Cardiomyopathy, ischemic EF improved to 40-45% by echo 12/16/13  CKD (chronic kidney disease) stage 3, GFR 30-59 ml/min SCr 1.7  CAD (coronary artery disease) CABG '06, Richardson risk Myoview June 2015  S/P ICD (internal cardiac defibrillator) procedure, 04/18/12, [employer] device ro ICM ICD placed February 2014  History of carotid artery stenosis S/P Carotid stent by Dr [name] Aug 2015   PLAN  I did not change [name]'s medications. He will follow up with Dr [name] in 6 months.   [name] KPA-C 01/04/2014 5:10 PM

## 2014-01-04 NOTE — Assessment & Plan Note (Signed)
ICD placed February 2014

## 2014-01-04 NOTE — Patient Instructions (Signed)
Your physician recommends that you schedule a follow-up appointment in: 6 Months with Dr Berry    

## 2014-01-05 DIAGNOSIS — I10 Essential (primary) hypertension: Secondary | ICD-10-CM | POA: Diagnosis not present

## 2014-01-05 DIAGNOSIS — M542 Cervicalgia: Secondary | ICD-10-CM | POA: Diagnosis not present

## 2014-01-05 DIAGNOSIS — I252 Old myocardial infarction: Secondary | ICD-10-CM | POA: Diagnosis not present

## 2014-01-05 DIAGNOSIS — J209 Acute bronchitis, unspecified: Secondary | ICD-10-CM | POA: Diagnosis not present

## 2014-01-05 DIAGNOSIS — D696 Thrombocytopenia, unspecified: Secondary | ICD-10-CM | POA: Diagnosis not present

## 2014-01-05 DIAGNOSIS — D61818 Other pancytopenia: Secondary | ICD-10-CM | POA: Diagnosis not present

## 2014-01-05 DIAGNOSIS — N183 Chronic kidney disease, stage 3 (moderate): Secondary | ICD-10-CM | POA: Diagnosis not present

## 2014-01-05 DIAGNOSIS — I5032 Chronic diastolic (congestive) heart failure: Secondary | ICD-10-CM | POA: Diagnosis not present

## 2014-01-06 ENCOUNTER — Emergency Department (HOSPITAL_COMMUNITY)
Admission: EM | Admit: 2014-01-06 | Discharge: 2014-01-06 | Disposition: A | Discharge status: Home / Self Care | Payer: Medicare Other | Attending: Emergency Medicine | Admitting: Emergency Medicine

## 2014-01-06 ENCOUNTER — Encounter (HOSPITAL_COMMUNITY): Payer: Self-pay | Admitting: Emergency Medicine

## 2014-01-06 DIAGNOSIS — I129 Hypertensive chronic kidney disease with stage 1 through stage 4 chronic kidney disease, or unspecified chronic kidney disease: Secondary | ICD-10-CM | POA: Insufficient documentation

## 2014-01-06 DIAGNOSIS — I252 Old myocardial infarction: Secondary | ICD-10-CM | POA: Diagnosis not present

## 2014-01-06 DIAGNOSIS — I2510 Atherosclerotic heart disease of native coronary artery without angina pectoris: Secondary | ICD-10-CM | POA: Insufficient documentation

## 2014-01-06 DIAGNOSIS — E119 Type 2 diabetes mellitus without complications: Secondary | ICD-10-CM | POA: Diagnosis not present

## 2014-01-06 DIAGNOSIS — Z8709 Personal history of other diseases of the respiratory system: Secondary | ICD-10-CM | POA: Insufficient documentation

## 2014-01-06 DIAGNOSIS — Z79899 Other long term (current) drug therapy: Secondary | ICD-10-CM | POA: Diagnosis not present

## 2014-01-06 DIAGNOSIS — E785 Hyperlipidemia, unspecified: Secondary | ICD-10-CM | POA: Insufficient documentation

## 2014-01-06 DIAGNOSIS — M436 Torticollis: Secondary | ICD-10-CM | POA: Insufficient documentation

## 2014-01-06 DIAGNOSIS — M1990 Unspecified osteoarthritis, unspecified site: Secondary | ICD-10-CM | POA: Diagnosis not present

## 2014-01-06 DIAGNOSIS — Z7902 Long term (current) use of antithrombotics/antiplatelets: Secondary | ICD-10-CM | POA: Insufficient documentation

## 2014-01-06 DIAGNOSIS — I5042 Chronic combined systolic (congestive) and diastolic (congestive) heart failure: Secondary | ICD-10-CM | POA: Insufficient documentation

## 2014-01-06 DIAGNOSIS — Z87891 Personal history of nicotine dependence: Secondary | ICD-10-CM | POA: Diagnosis not present

## 2014-01-06 DIAGNOSIS — Z7982 Long term (current) use of aspirin: Secondary | ICD-10-CM | POA: Insufficient documentation

## 2014-01-06 DIAGNOSIS — M542 Cervicalgia: Secondary | ICD-10-CM | POA: Diagnosis present

## 2014-01-06 DIAGNOSIS — I10 Essential (primary) hypertension: Secondary | ICD-10-CM | POA: Diagnosis not present

## 2014-01-06 DIAGNOSIS — N183 Chronic kidney disease, stage 3 (moderate): Secondary | ICD-10-CM | POA: Diagnosis not present

## 2014-01-06 DIAGNOSIS — Z951 Presence of aortocoronary bypass graft: Secondary | ICD-10-CM | POA: Diagnosis not present

## 2014-01-06 MED ORDER — DIAZEPAM 5 MG PO TABS
5.0000 mg | ORAL_TABLET | Freq: Once | ORAL | Status: AC
  Administered 2014-01-06: 5 mg via ORAL
  Filled 2014-01-06: qty 1

## 2014-01-06 MED ORDER — DIAZEPAM 5 MG PO TABS: 5.0000 mg | ORAL_TABLET | Freq: Two times a day (BID) | ORAL | Status: DC | PRN

## 2014-01-06 NOTE — Discharge Instructions (Signed)
Call Physical Therapy at the number listed on your referral/prescription. Recheck with your Primary Care Physician.  Torticollis, Acute You have suddenly (acutely) developed a twisted neck (torticollis). This is usually a self-limited condition. CAUSES  Acute torticollis may be caused by malposition, trauma or infection. Most commonly, acute torticollis is caused by sleeping in an awkward position. Torticollis may also be caused by the flexion, extension or twisting of the neck muscles beyond their normal position. Sometimes, the exact cause may not be known. SYMPTOMS  Usually, there is pain and limited movement of the neck. Your neck may twist to one side. DIAGNOSIS  The diagnosis is often made by physical examination. X-rays, CT scans or MRIs may be done if there is a history of trauma or concern of infection. TREATMENT  For a common, stiff neck that develops during sleep, treatment is focused on relaxing the contracted neck muscle. Medications (including shots) may be used to treat the problem. Most cases resolve in several days. Torticollis usually responds to conservative physical therapy. If left untreated, the shortened and spastic neck muscle can cause deformities in the face and neck. Rarely, surgery is required. HOME CARE INSTRUCTIONS   Use over-the-counter and prescription medications as directed by your caregiver.  Do stretching exercises and massage the neck as directed by your caregiver.  Follow up with physical therapy if needed and as directed by your caregiver. SEEK IMMEDIATE MEDICAL CARE IF:   You develop difficulty breathing or noisy breathing (stridor).  You drool, develop trouble swallowing or have pain with swallowing.  You develop numbness or weakness in the hands or feet.  You have changes in speech or vision.  You have problems with urination or bowel movements.  You have difficulty walking.  You have a fever.  You have increased pain. MAKE SURE YOU:    Understand these instructions.  Will watch your condition.  Will get help right away if you are not doing well or get worse. Document Released: 02/24/2000 Document Revised: 05/21/2011 Document Reviewed: 04/06/2009 [facility] Patient Information 2015 [place], [place]. This information is not intended to replace advice given to you by your health care provider. Make sure you discuss any questions you have with your health care provider.

## 2014-01-06 NOTE — ED Notes (Signed)
Per pt sts right sided neck pain and head pain. sts 2 weeks. sts has been taking 2 different pain meds without relief.

## 2014-01-06 NOTE — ED Notes (Signed)
MD James at the bedside  
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
## 2014-01-07 ENCOUNTER — Encounter: Payer: Self-pay | Admitting: Cardiology

## 2014-02-03 ENCOUNTER — Emergency Department (HOSPITAL_COMMUNITY)
Admission: EM | Admit: 2014-02-03 | Discharge: 2014-02-03 | Disposition: A | Discharge status: Home / Self Care | Payer: Medicare Other | Attending: Emergency Medicine | Admitting: Emergency Medicine

## 2014-02-03 ENCOUNTER — Encounter (HOSPITAL_COMMUNITY): Payer: Self-pay | Admitting: Emergency Medicine

## 2014-02-03 DIAGNOSIS — I252 Old myocardial infarction: Secondary | ICD-10-CM | POA: Diagnosis not present

## 2014-02-03 DIAGNOSIS — Z9189 Other specified personal risk factors, not elsewhere classified: Secondary | ICD-10-CM | POA: Diagnosis not present

## 2014-02-03 DIAGNOSIS — Z7902 Long term (current) use of antithrombotics/antiplatelets: Secondary | ICD-10-CM | POA: Diagnosis not present

## 2014-02-03 DIAGNOSIS — I5042 Chronic combined systolic (congestive) and diastolic (congestive) heart failure: Secondary | ICD-10-CM | POA: Insufficient documentation

## 2014-02-03 DIAGNOSIS — M1990 Unspecified osteoarthritis, unspecified site: Secondary | ICD-10-CM | POA: Diagnosis not present

## 2014-02-03 DIAGNOSIS — I2510 Atherosclerotic heart disease of native coronary artery without angina pectoris: Secondary | ICD-10-CM | POA: Diagnosis not present

## 2014-02-03 DIAGNOSIS — Z7982 Long term (current) use of aspirin: Secondary | ICD-10-CM | POA: Insufficient documentation

## 2014-02-03 DIAGNOSIS — E119 Type 2 diabetes mellitus without complications: Secondary | ICD-10-CM | POA: Insufficient documentation

## 2014-02-03 DIAGNOSIS — Z9889 Other specified postprocedural states: Secondary | ICD-10-CM | POA: Insufficient documentation

## 2014-02-03 DIAGNOSIS — Z87891 Personal history of nicotine dependence: Secondary | ICD-10-CM | POA: Diagnosis not present

## 2014-02-03 DIAGNOSIS — Z951 Presence of aortocoronary bypass graft: Secondary | ICD-10-CM | POA: Diagnosis not present

## 2014-02-03 DIAGNOSIS — N183 Chronic kidney disease, stage 3 (moderate): Secondary | ICD-10-CM | POA: Diagnosis not present

## 2014-02-03 DIAGNOSIS — Z79899 Other long term (current) drug therapy: Secondary | ICD-10-CM | POA: Insufficient documentation

## 2014-02-03 DIAGNOSIS — M542 Cervicalgia: Secondary | ICD-10-CM | POA: Diagnosis not present

## 2014-02-03 DIAGNOSIS — Z8709 Personal history of other diseases of the respiratory system: Secondary | ICD-10-CM | POA: Diagnosis not present

## 2014-02-03 DIAGNOSIS — Z95810 Presence of automatic (implantable) cardiac defibrillator: Secondary | ICD-10-CM | POA: Insufficient documentation

## 2014-02-03 DIAGNOSIS — E785 Hyperlipidemia, unspecified: Secondary | ICD-10-CM | POA: Diagnosis not present

## 2014-02-03 DIAGNOSIS — I129 Hypertensive chronic kidney disease with stage 1 through stage 4 chronic kidney disease, or unspecified chronic kidney disease: Secondary | ICD-10-CM | POA: Insufficient documentation

## 2014-02-03 DIAGNOSIS — R51 Headache: Secondary | ICD-10-CM | POA: Diagnosis present

## 2014-02-03 MED ORDER — PREDNISONE 20 MG PO TABS: ORAL_TABLET | ORAL | Status: DC

## 2014-02-03 MED ORDER — HYDROMORPHONE HCL 1 MG/ML IJ SOLN
1.0000 mg | Freq: Once | INTRAMUSCULAR | Status: AC
  Administered 2014-02-03: 1 mg via INTRAMUSCULAR
  Filled 2014-02-03: qty 1

## 2014-02-03 MED ORDER — KETOROLAC TROMETHAMINE 30 MG/ML IJ SOLN
30.0000 mg | Freq: Once | INTRAMUSCULAR | Status: AC
  Administered 2014-02-03: 30 mg via INTRAMUSCULAR
  Filled 2014-02-03: qty 1

## 2014-02-03 NOTE — ED Notes (Signed)
Pt states that he has had a migraine x 3 wks.  States that he has been seeing several doctors for it and taking medicine but nothing is helping.  States he needs to know why he has a headache.  Denies NVD.  Denies sensitivity to light and sound.

## 2014-02-03 NOTE — Discharge Instructions (Signed)
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
## 2014-02-03 NOTE — ED Notes (Signed)
Bed: WA22 Expected date:  Expected time:  Means of arrival:  Comments: 

## 2014-02-18 ENCOUNTER — Encounter (HOSPITAL_COMMUNITY): Payer: Self-pay | Admitting: Cardiovascular Disease

## 2014-04-01 ENCOUNTER — Encounter: Payer: Medicare Other | Admitting: *Deleted

## 2014-04-01 ENCOUNTER — Telehealth: Payer: Self-pay | Admitting: Cardiology

## 2014-04-01 NOTE — Telephone Encounter (Signed)
LMOVM reminding pt to send remote transmission.   

## 2014-04-05 ENCOUNTER — Encounter: Payer: Self-pay | Admitting: Cardiology

## 2014-04-07 ENCOUNTER — Telehealth: Payer: Self-pay | Admitting: Cardiovascular Disease

## 2014-04-07 NOTE — Telephone Encounter (Signed)
Informed pt to send manual transmission.

## 2014-04-07 NOTE — Telephone Encounter (Signed)
New message     Talk to [facility]

## 2014-04-13 ENCOUNTER — Telehealth: Payer: Self-pay | Admitting: Cardiovascular Disease

## 2014-04-21 NOTE — Telephone Encounter (Signed)
Closed encounter °

## 2014-05-11 ENCOUNTER — Other Ambulatory Visit (HOSPITAL_COMMUNITY): Payer: Self-pay | Admitting: Cardiovascular Disease

## 2014-05-11 DIAGNOSIS — I6529 Occlusion and stenosis of unspecified carotid artery: Secondary | ICD-10-CM

## 2014-05-22 IMAGING — CR DG CHEST 1V PORT
1 series · 1 of 1 positions shown · non-contrast
Comparison: Chest radiograph performed 07/26/2011

CLINICAL DATA: Shortness of breath and chest pain.

PORTABLE CHEST - 1 VIEW

[AP]
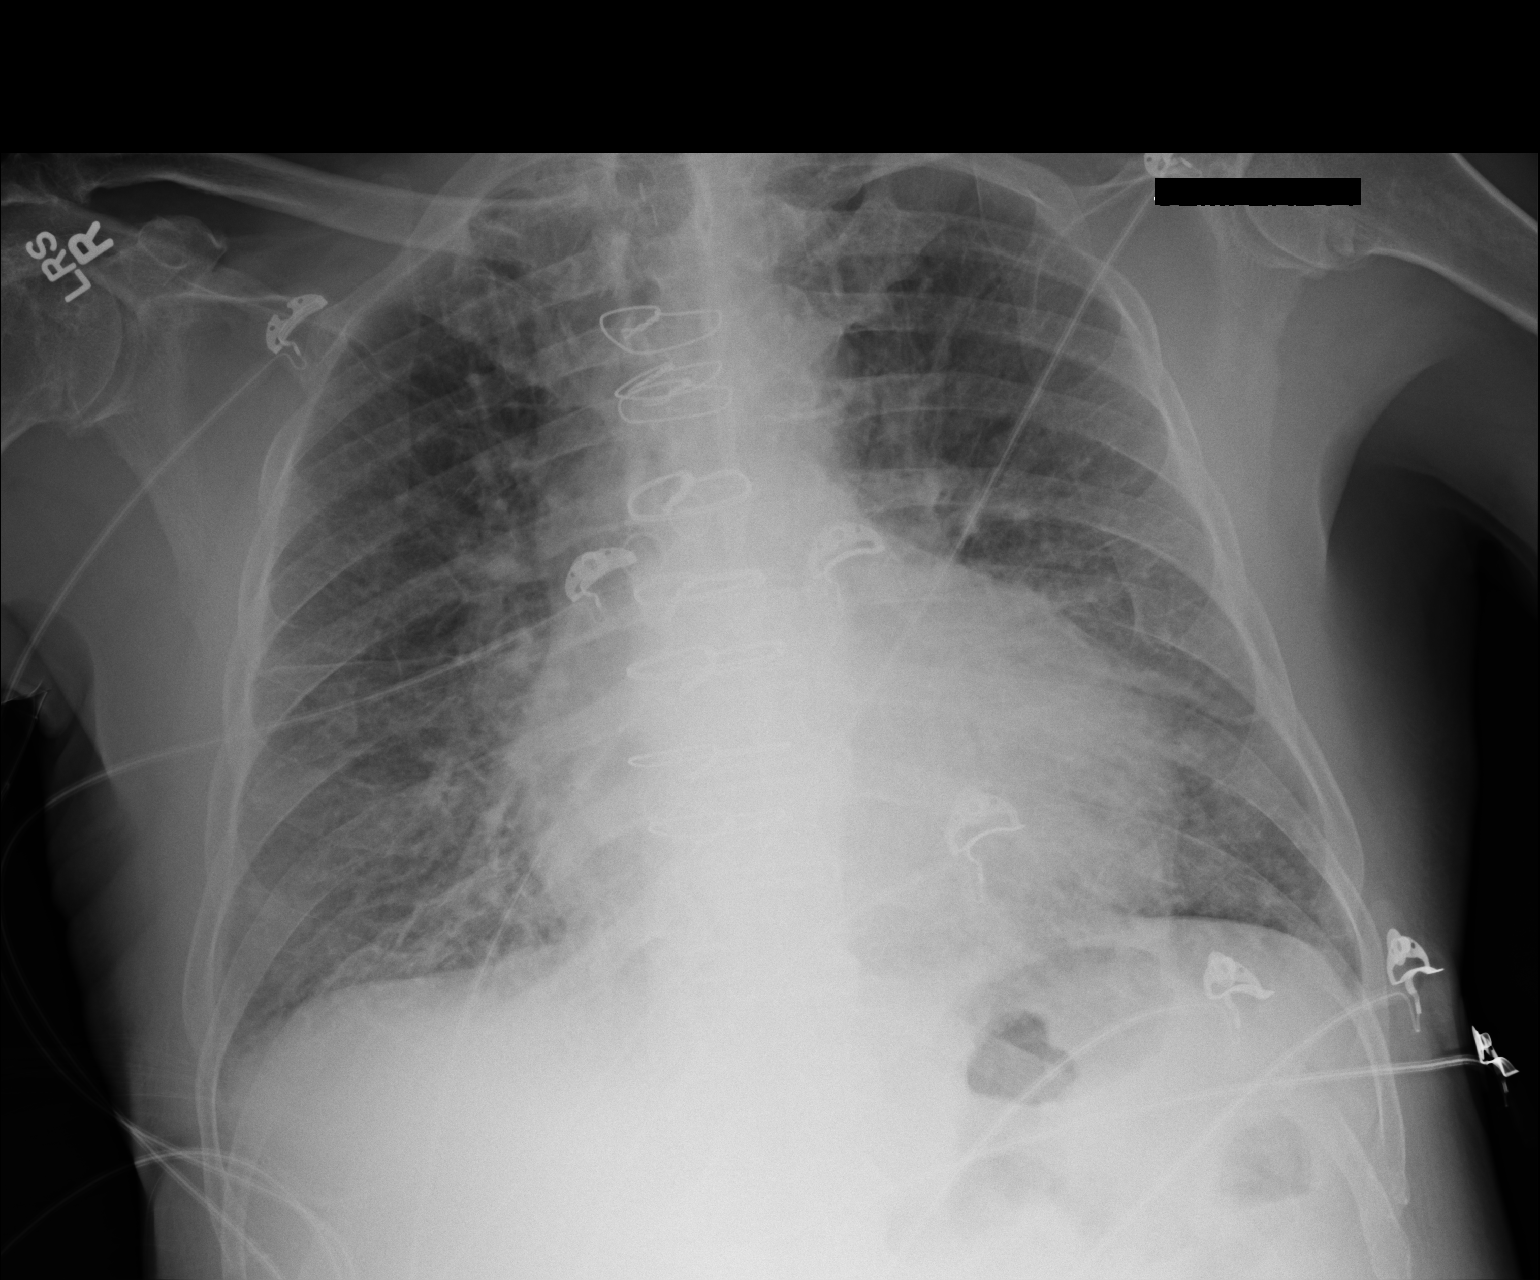

[1 of 1 positions shown; findings below may reference images not displayed]

FINDINGS: The lungs are well-aerated.  Vascular congestion is
noted, with mildly increased interstitial markings, suggestive of
mild interstitial edema.  There is no evidence of pleural effusion
or pneumothorax.

The cardiomediastinal silhouette is mildly enlarged; the patient is
status post median sternotomy, with evidence of prior CABG.  No
acute osseous abnormalities are seen.
IMPRESSION: Vascular congestion and mild cardiomegaly, with mildly increased
interstitial markings, suggestive of mild interstitial edema.

## 2014-06-03 ENCOUNTER — Telehealth: Payer: Self-pay | Admitting: Cardiovascular Disease

## 2014-06-03 NOTE — Telephone Encounter (Signed)
Closed encounter °

## 2014-06-08 ENCOUNTER — Inpatient Hospital Stay (HOSPITAL_COMMUNITY)
Admission: EM | Admit: 2014-06-08 | Discharge: 2014-06-10 | DRG: 292 | Disposition: A | Discharge status: Home / Self Care | Payer: Medicare Other | Attending: Internal Medicine | Admitting: Internal Medicine

## 2014-06-08 ENCOUNTER — Encounter (HOSPITAL_COMMUNITY): Payer: Self-pay | Admitting: Emergency Medicine

## 2014-06-08 ENCOUNTER — Other Ambulatory Visit: Payer: Self-pay

## 2014-06-08 ENCOUNTER — Ambulatory Visit (INDEPENDENT_AMBULATORY_CARE_PROVIDER_SITE_OTHER): Payer: Medicare Other | Admitting: *Deleted

## 2014-06-08 ENCOUNTER — Emergency Department (HOSPITAL_COMMUNITY): Payer: Medicare Other

## 2014-06-08 ENCOUNTER — Ambulatory Visit (HOSPITAL_COMMUNITY)
Admission: RE | Admit: 2014-06-08 | Discharge: 2014-06-08 | Disposition: A | Discharge status: Home / Self Care | Payer: Medicare Other | Source: Ambulatory Visit | Attending: Cardiology | Admitting: Cardiology

## 2014-06-08 VITALS — BP 140/90 | HR 60

## 2014-06-08 DIAGNOSIS — E089 Diabetes mellitus due to underlying condition without complications: Secondary | ICD-10-CM | POA: Diagnosis present

## 2014-06-08 DIAGNOSIS — E119 Type 2 diabetes mellitus without complications: Secondary | ICD-10-CM | POA: Diagnosis present

## 2014-06-08 DIAGNOSIS — I129 Hypertensive chronic kidney disease with stage 1 through stage 4 chronic kidney disease, or unspecified chronic kidney disease: Secondary | ICD-10-CM | POA: Diagnosis present

## 2014-06-08 DIAGNOSIS — F1290 Cannabis use, unspecified, uncomplicated: Secondary | ICD-10-CM | POA: Diagnosis present

## 2014-06-08 DIAGNOSIS — I672 Cerebral atherosclerosis: Secondary | ICD-10-CM | POA: Insufficient documentation

## 2014-06-08 DIAGNOSIS — R0602 Shortness of breath: Secondary | ICD-10-CM | POA: Diagnosis not present

## 2014-06-08 DIAGNOSIS — Z7982 Long term (current) use of aspirin: Secondary | ICD-10-CM

## 2014-06-08 DIAGNOSIS — I739 Peripheral vascular disease, unspecified: Secondary | ICD-10-CM | POA: Diagnosis present

## 2014-06-08 DIAGNOSIS — E785 Hyperlipidemia, unspecified: Secondary | ICD-10-CM | POA: Diagnosis present

## 2014-06-08 DIAGNOSIS — N183 Chronic kidney disease, stage 3 unspecified: Secondary | ICD-10-CM | POA: Diagnosis present

## 2014-06-08 DIAGNOSIS — Z87891 Personal history of nicotine dependence: Secondary | ICD-10-CM | POA: Diagnosis not present

## 2014-06-08 DIAGNOSIS — I5043 Acute on chronic combined systolic (congestive) and diastolic (congestive) heart failure: Secondary | ICD-10-CM | POA: Diagnosis not present

## 2014-06-08 DIAGNOSIS — J449 Chronic obstructive pulmonary disease, unspecified: Secondary | ICD-10-CM | POA: Diagnosis present

## 2014-06-08 DIAGNOSIS — N184 Chronic kidney disease, stage 4 (severe): Secondary | ICD-10-CM | POA: Diagnosis present

## 2014-06-08 DIAGNOSIS — Z95810 Presence of automatic (implantable) cardiac defibrillator: Secondary | ICD-10-CM

## 2014-06-08 DIAGNOSIS — I5021 Acute systolic (congestive) heart failure: Secondary | ICD-10-CM | POA: Diagnosis not present

## 2014-06-08 DIAGNOSIS — R0789 Other chest pain: Secondary | ICD-10-CM | POA: Diagnosis not present

## 2014-06-08 DIAGNOSIS — I10 Essential (primary) hypertension: Secondary | ICD-10-CM | POA: Diagnosis not present

## 2014-06-08 DIAGNOSIS — I252 Old myocardial infarction: Secondary | ICD-10-CM

## 2014-06-08 DIAGNOSIS — D631 Anemia in chronic kidney disease: Secondary | ICD-10-CM | POA: Diagnosis present

## 2014-06-08 DIAGNOSIS — M1990 Unspecified osteoarthritis, unspecified site: Secondary | ICD-10-CM | POA: Diagnosis present

## 2014-06-08 DIAGNOSIS — I6529 Occlusion and stenosis of unspecified carotid artery: Secondary | ICD-10-CM

## 2014-06-08 DIAGNOSIS — I2510 Atherosclerotic heart disease of native coronary artery without angina pectoris: Secondary | ICD-10-CM | POA: Diagnosis present

## 2014-06-08 DIAGNOSIS — I779 Disorder of arteries and arterioles, unspecified: Secondary | ICD-10-CM

## 2014-06-08 DIAGNOSIS — R0902 Hypoxemia: Secondary | ICD-10-CM | POA: Diagnosis present

## 2014-06-08 DIAGNOSIS — Z951 Presence of aortocoronary bypass graft: Secondary | ICD-10-CM

## 2014-06-08 DIAGNOSIS — I509 Heart failure, unspecified: Secondary | ICD-10-CM

## 2014-06-08 DIAGNOSIS — I255 Ischemic cardiomyopathy: Secondary | ICD-10-CM | POA: Diagnosis present

## 2014-06-08 DIAGNOSIS — R079 Chest pain, unspecified: Secondary | ICD-10-CM | POA: Diagnosis present

## 2014-06-08 DIAGNOSIS — I25119 Atherosclerotic heart disease of native coronary artery with unspecified angina pectoris: Secondary | ICD-10-CM | POA: Diagnosis not present

## 2014-06-08 LAB — CBC
HEMATOCRIT: 30.7 % — AB (ref 39.0–52.0)
Hemoglobin: 9.9 g/dL — ABNORMAL LOW (ref 13.0–17.0)
MCH: 30.6 pg (ref 26.0–34.0)
MCHC: 32.2 g/dL (ref 30.0–36.0)
MCV: 94.8 fL (ref 78.0–100.0)
Platelets: 136 10*3/uL — ABNORMAL LOW (ref 150–400)
RBC: 3.24 MIL/uL — AB (ref 4.22–5.81)
RDW: 13.9 % (ref 11.5–15.5)
WBC: 3.3 10*3/uL — AB (ref 4.0–10.5)

## 2014-06-08 LAB — I-STAT TROPONIN, ED: Troponin i, poc: 0.03 ng/mL (ref 0.00–0.08)

## 2014-06-08 LAB — BASIC METABOLIC PANEL
Anion gap: 5 (ref 5–15)
BUN: 30 mg/dL — AB (ref 6–23)
CO2: 20 mmol/L (ref 19–32)
CREATININE: 2.15 mg/dL — AB (ref 0.50–1.35)
Calcium: 9.5 mg/dL (ref 8.4–10.5)
Chloride: 111 mmol/L (ref 96–112)
GFR calc Af Amer: 35 mL/min — ABNORMAL LOW (ref >90)
GFR calc non Af Amer: 30 mL/min — ABNORMAL LOW (ref >90)
GLUCOSE: 104 mg/dL — AB (ref 70–99)
Potassium: 5 mmol/L (ref 3.5–5.1)
Sodium: 136 mmol/L (ref 135–145)

## 2014-06-08 LAB — TROPONIN I: TROPONIN I: 0.03 ng/mL (ref <0.031)

## 2014-06-08 LAB — RETICULOCYTES
RBC.: 3.29 MIL/uL — ABNORMAL LOW (ref 4.22–5.81)
Retic Count, Absolute: 42.8 10*3/uL (ref 19.0–186.0)
Retic Ct Pct: 1.3 % (ref 0.4–3.1)

## 2014-06-08 LAB — GLUCOSE, CAPILLARY: Glucose-Capillary: 83 mg/dL (ref 70–99)

## 2014-06-08 LAB — BRAIN NATRIURETIC PEPTIDE: B Natriuretic Peptide: 558.3 pg/mL — ABNORMAL HIGH (ref 0.0–100.0)

## 2014-06-08 MED ORDER — DIAZEPAM 5 MG PO TABS: 5.0000 mg | ORAL_TABLET | Freq: Two times a day (BID) | ORAL | Status: DC | PRN

## 2014-06-08 MED ORDER — AMLODIPINE BESYLATE 10 MG PO TABS
10.0000 mg | ORAL_TABLET | Freq: Every day | ORAL | Status: DC
  Administered 2014-06-09 – 2014-06-10 (×2): 10 mg via ORAL
  Filled 2014-06-08 (×3): qty 1

## 2014-06-08 MED ORDER — TRAMADOL HCL 50 MG PO TABS
50.0000 mg | ORAL_TABLET | Freq: Three times a day (TID) | ORAL | Status: DC
  Administered 2014-06-08 – 2014-06-09 (×4): 50 mg via ORAL
  Filled 2014-06-08 (×5): qty 1

## 2014-06-08 MED ORDER — ACETAMINOPHEN 325 MG PO TABS: 650.0000 mg | ORAL_TABLET | Freq: Four times a day (QID) | ORAL | Status: DC | PRN

## 2014-06-08 MED ORDER — TRAZODONE HCL 150 MG PO TABS
150.0000 mg | ORAL_TABLET | Freq: Every day | ORAL | Status: DC
  Administered 2014-06-08 – 2014-06-09 (×2): 150 mg via ORAL
  Filled 2014-06-08 (×3): qty 1

## 2014-06-08 MED ORDER — CARVEDILOL 6.25 MG PO TABS
6.2500 mg | ORAL_TABLET | Freq: Two times a day (BID) | ORAL | Status: DC
  Administered 2014-06-08 – 2014-06-10 (×4): 6.25 mg via ORAL
  Filled 2014-06-08 (×5): qty 1

## 2014-06-08 MED ORDER — POTASSIUM CHLORIDE CRYS ER 20 MEQ PO TBCR
40.0000 meq | EXTENDED_RELEASE_TABLET | Freq: Two times a day (BID) | ORAL | Status: DC
  Administered 2014-06-08 – 2014-06-09 (×3): 40 meq via ORAL
  Filled 2014-06-08 (×5): qty 2

## 2014-06-08 MED ORDER — OXYCODONE-ACETAMINOPHEN 10-325 MG PO TABS: 1.0000 | ORAL_TABLET | Freq: Three times a day (TID) | ORAL | Status: DC | PRN

## 2014-06-08 MED ORDER — ASPIRIN EC 325 MG PO TBEC
325.0000 mg | DELAYED_RELEASE_TABLET | Freq: Every day | ORAL | Status: DC
  Administered 2014-06-09 – 2014-06-10 (×2): 325 mg via ORAL
  Filled 2014-06-08 (×2): qty 1

## 2014-06-08 MED ORDER — FUROSEMIDE 10 MG/ML IJ SOLN
40.0000 mg | Freq: Three times a day (TID) | INTRAMUSCULAR | Status: DC
  Administered 2014-06-08 – 2014-06-10 (×5): 40 mg via INTRAVENOUS
  Filled 2014-06-08 (×8): qty 4

## 2014-06-08 MED ORDER — CLOPIDOGREL BISULFATE 75 MG PO TABS
75.0000 mg | ORAL_TABLET | Freq: Every day | ORAL | Status: DC
  Administered 2014-06-09 – 2014-06-10 (×2): 75 mg via ORAL
  Filled 2014-06-08 (×2): qty 1

## 2014-06-08 MED ORDER — ENOXAPARIN SODIUM 40 MG/0.4ML ~~LOC~~ SOLN
40.0000 mg | SUBCUTANEOUS | Status: DC
  Administered 2014-06-08 – 2014-06-09 (×2): 40 mg via SUBCUTANEOUS
  Filled 2014-06-08 (×3): qty 0.4

## 2014-06-08 MED ORDER — ACETAMINOPHEN 650 MG RE SUPP: 650.0000 mg | Freq: Four times a day (QID) | RECTAL | Status: DC | PRN

## 2014-06-08 MED ORDER — OXYCODONE-ACETAMINOPHEN 5-325 MG PO TABS: 1.0000 | ORAL_TABLET | Freq: Three times a day (TID) | ORAL | Status: DC | PRN

## 2014-06-08 MED ORDER — ONDANSETRON HCL 4 MG/2ML IJ SOLN: 4.0000 mg | Freq: Four times a day (QID) | INTRAMUSCULAR | Status: DC | PRN

## 2014-06-08 MED ORDER — CARISOPRODOL 350 MG PO TABS
350.0000 mg | ORAL_TABLET | Freq: Three times a day (TID) | ORAL | Status: DC
  Administered 2014-06-08 – 2014-06-10 (×5): 350 mg via ORAL
  Filled 2014-06-08 (×5): qty 1

## 2014-06-08 MED ORDER — HYDRALAZINE HCL 25 MG PO TABS
25.0000 mg | ORAL_TABLET | Freq: Two times a day (BID) | ORAL | Status: DC
  Administered 2014-06-08 – 2014-06-10 (×4): 25 mg via ORAL
  Filled 2014-06-08 (×5): qty 1

## 2014-06-08 MED ORDER — ENOXAPARIN SODIUM 30 MG/0.3ML ~~LOC~~ SOLN: 30.0000 mg | SUBCUTANEOUS | Status: DC

## 2014-06-08 MED ORDER — OXYCODONE HCL 5 MG PO TABS: 5.0000 mg | ORAL_TABLET | Freq: Three times a day (TID) | ORAL | Status: DC | PRN

## 2014-06-08 MED ORDER — ONDANSETRON HCL 4 MG PO TABS: 4.0000 mg | ORAL_TABLET | Freq: Four times a day (QID) | ORAL | Status: DC | PRN

## 2014-06-08 MED ORDER — FUROSEMIDE 10 MG/ML IJ SOLN
40.0000 mg | Freq: Once | INTRAMUSCULAR | Status: AC
Start: 2014-06-08 — End: 2014-06-08
  Administered 2014-06-08: 40 mg via INTRAVENOUS
  Filled 2014-06-08: qty 4

## 2014-06-08 MED ORDER — ATORVASTATIN CALCIUM 40 MG PO TABS
40.0000 mg | ORAL_TABLET | Freq: Every day | ORAL | Status: DC
  Administered 2014-06-09 – 2014-06-10 (×2): 40 mg via ORAL
  Filled 2014-06-08 (×2): qty 1

## 2014-06-08 MED ORDER — IPRATROPIUM-ALBUTEROL 0.5-2.5 (3) MG/3ML IN SOLN: 3.0000 mL | Freq: Four times a day (QID) | RESPIRATORY_TRACT | Status: DC | PRN

## 2014-06-08 NOTE — ED Notes (Signed)
Pt from MD office- had a carotid dopler today. Pt started having CP this morning 6/10. Pt has hx of MI. Complaining of SOB for past 2 days. Pt has bilat lower extremity pitting edema. Pt was A paced on monitor. BP 137/82, HR 60
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
## 2014-06-08 NOTE — Patient Instructions (Signed)
[place]

## 2014-06-08 NOTE — Progress Notes (Signed)
1730 Report received fr  ED 1815 Transported in Ed fully awake alert and oriented .Kept comfortable in bed cervical motion tenderness aware

## 2014-06-08 NOTE — Progress Notes (Signed)
Patient here in office for carotid doppler. He states having chest pain.   pain started this morning off/ on.  Patient states he has been short of breath and lower legs swollen. Patient rate chest pain at 8/10 blood pressure 140/90 pulse 60 ekg obtain Reviewed with Dr Sallyanne Kuster - per order send patient  to ER CHEST PAIN 6 /10.  patient awaitng for EMS- CAROTD DOPPLER COMPLETED  PATIENT IN DOPPLER ROOM  PATIENT STATES HIS WEIGHT WAS 165 ABOUT 3 DAYS AGO --TODAY IT IS 190 LBS  Patient transported to [facility]

## 2014-06-08 NOTE — Progress Notes (Signed)
Carotid Duplex Completed. [name], BS, RDMS, RVT  

## 2014-06-08 NOTE — ED Notes (Signed)
CBG 141 per EMS. EMS gave 324mg  ASA and 1 of nitro. Pain decreased to 0/10

## 2014-06-09 ENCOUNTER — Other Ambulatory Visit: Payer: Self-pay

## 2014-06-09 ENCOUNTER — Encounter (HOSPITAL_COMMUNITY): Payer: Self-pay | Admitting: General Practice

## 2014-06-09 DIAGNOSIS — R0902 Hypoxemia: Secondary | ICD-10-CM

## 2014-06-09 DIAGNOSIS — I509 Heart failure, unspecified: Secondary | ICD-10-CM

## 2014-06-09 DIAGNOSIS — Z95810 Presence of automatic (implantable) cardiac defibrillator: Secondary | ICD-10-CM

## 2014-06-09 DIAGNOSIS — I25119 Atherosclerotic heart disease of native coronary artery with unspecified angina pectoris: Secondary | ICD-10-CM

## 2014-06-09 LAB — CBC
HCT: 31.9 % — ABNORMAL LOW (ref 39.0–52.0)
Hemoglobin: 10.4 g/dL — ABNORMAL LOW (ref 13.0–17.0)
MCH: 30.7 pg (ref 26.0–34.0)
MCHC: 32.6 g/dL (ref 30.0–36.0)
MCV: 94.1 fL (ref 78.0–100.0)
Platelets: 160 10*3/uL (ref 150–400)
RBC: 3.39 MIL/uL — ABNORMAL LOW (ref 4.22–5.81)
RDW: 13.7 % (ref 11.5–15.5)
WBC: 2.9 10*3/uL — ABNORMAL LOW (ref 4.0–10.5)

## 2014-06-09 LAB — GLUCOSE, CAPILLARY
Glucose-Capillary: 152 mg/dL — ABNORMAL HIGH (ref 70–99)
Glucose-Capillary: 131 mg/dL — ABNORMAL HIGH (ref 70–99)

## 2014-06-09 LAB — TROPONIN I
Troponin I: 0.03 ng/mL (ref <0.031)
Troponin I: 0.03 ng/mL (ref <0.031)

## 2014-06-09 LAB — IRON AND TIBC
Iron: 73 ug/dL (ref 42–165)
Saturation Ratios: 23 % (ref 20–55)
TIBC: 320 ug/dL (ref 215–435)
UIBC: 247 ug/dL (ref 125–400)

## 2014-06-09 LAB — BASIC METABOLIC PANEL
Anion gap: 9 (ref 5–15)
BUN: 30 mg/dL — ABNORMAL HIGH (ref 6–23)
CALCIUM: 10.2 mg/dL (ref 8.4–10.5)
CHLORIDE: 109 mmol/L (ref 96–112)
CO2: 23 mmol/L (ref 19–32)
Creatinine, Ser: 2.22 mg/dL — ABNORMAL HIGH (ref 0.50–1.35)
GFR calc Af Amer: 34 mL/min — ABNORMAL LOW (ref >90)
GFR calc non Af Amer: 29 mL/min — ABNORMAL LOW (ref >90)
Glucose, Bld: 85 mg/dL (ref 70–99)
Potassium: 4.9 mmol/L (ref 3.5–5.1)
Sodium: 141 mmol/L (ref 135–145)

## 2014-06-09 LAB — VITAMIN B12: VITAMIN B 12: 613 pg/mL (ref 211–911)

## 2014-06-09 LAB — FERRITIN: FERRITIN: 94 ng/mL (ref 22–322)

## 2014-06-09 LAB — FOLATE: Folate: 12.5 ng/mL

## 2014-06-09 NOTE — Care Management Note (Unsigned)
    Page 1 of 1   06/10/2014     1:46:17 PM CARE MANAGEMENT NOTE 06/10/2014  Patient:  [name], [name]   Account Number:  [id number]  Date Initiated:  06/09/2014  Documentation initiated by:  Loney Peto  Subjective/Objective Assessment:   Pt adm on 06/08/14 with CHF exacerbation.  PTA, pt independent, lives alone.     Action/Plan:   Will follow for dc needs as pt progresses.   Anticipated DC Date:  06/12/2014   Anticipated DC Plan:  [place]  CM consult      Choice offered to / List presented to:             Status of service:  In process, will continue to follow Medicare Important Message given?   (If response is "NO", the following Medicare IM given date fields will be blank) Date Medicare IM given:   Medicare IM given by:   Date Additional Medicare IM given:   Additional Medicare IM given by:    Discharge Disposition:    Per UR Regulation:  Reviewed for med. necessity/level of care/duration of stay  If discussed at [place] of Stay Meetings, dates discussed:    Comments:  06/10/14 Ellan Lambert, RN, BSN [phone] Would recommend [facility] for CHF follow up/disease management. MD, if you agree, please leave order.  Thanks!

## 2014-06-09 NOTE — Progress Notes (Signed)
Patient Profile: 69 year old male with multiple medical issues including peripheral vascular disease, coronary artery disease status post bypass, ischemic cardiomyopathy , COPD, smoker admitted with chest pain and acute systolic heart failure.  Subjective: Feels significantly better. Breathing improved. CP resolved.   Objective: Vital signs in last 24 hours: Temp:  [96.5 F (35.8 C)-97.9 F (36.6 C)] 97.9 F (36.6 C) (03/30 0523) Pulse Rate:  [59-77] 68 (03/30 1010) Resp:  [14-26] 20 (03/30 0523) BP: (123-159)/(52-94) 126/54 mmHg (03/30 1010) SpO2:  [91 %-100 %] 93 % (03/30 0523) Weight:  [178 lb 6.4 oz (80.922 kg)-196 lb (88.905 kg)] 178 lb 6.4 oz (80.922 kg) (03/30 0523) Last BM Date: 06/07/14  Intake/Output from previous day: 03/29 0701 - 03/30 0700 In: 480 [P.O.:480] Out: 3580 [Urine:3580] Intake/Output this shift: Total I/O In: 480 [P.O.:480] Out: 575 [Urine:575]  Medications Current Facility-Administered Medications  Medication Dose Route Frequency Provider Last Rate Last Dose  . acetaminophen (TYLENOL) tablet 650 mg  650 mg Oral Q6H PRN Domenic Polite, MD       Or  . acetaminophen (TYLENOL) suppository 650 mg  650 mg Rectal Q6H PRN Domenic Polite, MD      . amLODipine (NORVASC) tablet 10 mg  10 mg Oral Daily Domenic Polite, MD   10 mg at 06/09/14 1012  . aspirin EC tablet 325 mg  325 mg Oral Daily Domenic Polite, MD   325 mg at 06/09/14 1012  . atorvastatin (LIPITOR) tablet 40 mg  40 mg Oral Daily Domenic Polite, MD   40 mg at 06/09/14 1012  . carisoprodol (SOMA) tablet 350 mg  350 mg Oral TID Domenic Polite, MD   350 mg at 06/09/14 1014  . carvedilol (COREG) tablet 6.25 mg  6.25 mg Oral BID Domenic Polite, MD   6.25 mg at 06/09/14 1012  . clopidogrel (PLAVIX) tablet 75 mg  75 mg Oral Daily Domenic Polite, MD   75 mg at 06/09/14 1012  . diazepam (VALIUM) tablet 5 mg  5 mg Oral Q12H PRN Domenic Polite, MD      . enoxaparin (LOVENOX) injection 40 mg  40 mg  Subcutaneous Q24H Donalynn Furlong [place], RPH   40 mg at 06/08/14 2033  . furosemide (LASIX) injection 40 mg  40 mg Intravenous Q8H Domenic Polite, MD   40 mg at 06/09/14 0550  . hydrALAZINE (APRESOLINE) tablet 25 mg  25 mg Oral BID Domenic Polite, MD   25 mg at 06/09/14 1012  . ipratropium-albuterol (DUONEB) 0.5-2.5 (3) MG/3ML nebulizer solution 3 mL  3 mL Inhalation Q6H PRN Domenic Polite, MD      . ondansetron (ZOFRAN) tablet 4 mg  4 mg Oral Q6H PRN Domenic Polite, MD       Or  . ondansetron (ZOFRAN) injection 4 mg  4 mg Intravenous Q6H PRN Domenic Polite, MD      . oxyCODONE-acetaminophen (PERCOCET/ROXICET) 5-325 MG per tablet 1 tablet  1 tablet Oral Q8H PRN Domenic Polite, MD       And  . oxyCODONE (Oxy IR/ROXICODONE) immediate release tablet 5 mg  5 mg Oral Q8H PRN Domenic Polite, MD      . potassium chloride SA (K-DUR,KLOR-CON) CR tablet 40 mEq  40 mEq Oral BID Domenic Polite, MD   40 mEq at 06/09/14 1013  . traMADol (ULTRAM) tablet 50 mg  50 mg Oral TID Domenic Polite, MD   50 mg at 06/09/14 1012  . traZODone (DESYREL) tablet 150 mg  150 mg Oral QHS Domenic Polite, MD  150 mg at 06/08/14 2203    PE: General appearance: alert, cooperative and no distress Neck: no carotid bruit and + JVD Lungs: clear to auscultation bilaterally Heart: regular rate and rhythm, S1, S2 normal, no murmur, click, rub or gallop Extremities: trace -1+ LEE on the right. No edema on the left Pulses: 2+ and symmetric Skin: warm and dry Neurologic: Grossly normal  Filed Weights   06/08/14 1425 06/08/14 1839 06/09/14 0523  Weight: 196 lb (88.905 kg) 187 lb 2.7 oz (84.9 kg) 178 lb 6.4 oz (80.922 kg)   Lab Results:   Recent Labs  06/08/14 1444 06/09/14 0722  WBC 3.3* 2.9*  HGB 9.9* 10.4*  HCT 30.7* 31.9*  PLT 136* 160   BMET  Recent Labs  06/08/14 1444 06/09/14 0722  NA 136 141  K 5.0 4.9  CL 111 109  CO2 20 23  GLUCOSE 104* 85  BUN 30* 30*  CREATININE 2.15* 2.22*  CALCIUM 9.5 10.2    PT/INR No results for input(s): LABPROT, INR in the last 72 hours. Cholesterol No results for input(s): CHOL in the last 72 hours. Cardiac Panel (last 3 results)  Recent Labs  06/08/14 1928 06/09/14 0037 06/09/14 0722  TROPONINI 0.03 0.03 0.03   Assessment/Plan  Active Problems:   Diabetes mellitus due to underlying condition   CAD (coronary artery disease)   Chest pain at rest   CKD (chronic kidney disease) stage 3, GFR 30-59 ml/min   COPD (chronic obstructive pulmonary disease)   S/P ICD (internal cardiac defibrillator) procedure, 04/18/12, Medtronic device ro ICM   CHF (congestive heart failure)   Acute on chronic combined systolic and diastolic CHF, NYHA class 3   Essential hypertension   Carotid artery disease   CHF exacerbation   1. Acute on Chronic Systolic CHF:  EF is as high as 40-45% on most recent echocardiogram however has been in the 25-35% range in the past. He has an ICD. He has had good UOP on IV Lasix. -3.5L in past 24 hrs. I/Os net -3.2L since admit. Breathing and LEE both improved. Weight is down 18 lbs. Now at 178 lb. Baseline weight is 165-170 lb. Still with elevated JVD and mild LEE. Continue IV Lasix. May be able to transition to PO tomorrow. BP and electrolytes stable. Continue to monitor renal function closely. Continue strict I/Os, daily weights and low sodium diet.  Continue Coreg. No ACE/ARB given CKD. Continue hydralazine for afterload reduction. BP is controlled.   2. Chest Pain: CP resolved with diuresis. Recent reassuring nuclear stress test less than one year ago. EKG does not show any new changes although interventricular conduction delay makes it challenging to decipher ischemic changes. However, cardiac enzymes are negative x 3. No further work-up indicated. Continue ASA, statin and BB.  3.  Chronic kidney disease: stage 3/4. Creatinine is slightly increased from October from 1.79 up to 2.15. Further increase in past 24 hrs to 2.22. He is on IV  Lasix for diuresis for HF, but also on scheduled tramadol TID. Pt does not take this at home. He is not in any pain. Unsure why this is scheduled. Would use caution with tramadol use given renal disease.   4. HTN: BP controlled. Continue amlodipine, carvedilol and hydralazine.   5. Carotid artery disease: carotid artery stenting 10/2013, continue dual antiplatelet therapy. No strokelike symptoms.    LOS: 1 day    Brittainy M. Ladoris Gene 06/09/2014 10:48 AM  Patient seen and examined and history reviewed. Agree with  above findings and plan. Excellent response to diuretics. Weight down. Dyspnea and chest pain resolved. Cardiac enzymes all negative. I think his chest pain was all related to CHF. Dietary sodium intake may be part of the problem. Discussed avoidance of processed meats, potato chips and canned foods. If he continues with his good response could switch to po lasix in am.   Loriel Diehl [place], [place] 06/09/2014 1:34 PM

## 2014-06-09 NOTE — Progress Notes (Signed)
[facility] PROGRESS NOTE  [name] [id number] DOB: April 10, 1945 DOA: 06/08/2014 PCP: Wenda Low, MD  Assessment/Plan:  Acute on chronic combined systolic CHF/ischemic cardiomyopathy -Pt is currently continued on IV lasix with net neg 2.8L -Cardiology following, recs for possible transition to PO lasix tomorrow -ECHo 10/15 with EF of 40-45% -continue Coreg, no ACE due to CKD  Chest pain -likely due to above, serial trop neg -continue ASA, plavix, coreg, statin -low risk myoview 6/15  CAD/s/p CABG'06 , s/p ICD -per above  DM -diet controlled, check hbaic  CKD (chronic kidney disease) stage 3/4 -stable, monitor with diuresis  COPD (chronic obstructive pulmonary disease) -stable, nebs PRN  Essential hypertension -continue on hydralazine, coreg, amlodipine  Carotid artery disease -S/P Carotid stent by Dr Gwenlyn Found Aug 2015 -continue plavix   chronic anemia -likely due to CKD -check anemia panel  Code Status: Full Family Communication: Pt in room (indicate person spoken with, relationship, and if by phone, the number) Disposition Plan: Pending   Consultants:  Cardiology  Procedures:    Antibiotics:  none (indicate start date, and stop date if known)  HPI/Subjective: No complaints  Objective: Filed Vitals:   06/09/14 0157 06/09/14 0523 06/09/14 1010 06/09/14 1400  BP: 123/52 123/53 126/54 120/57  Pulse: 60 61 68 59  Temp: 97.3 F (36.3 C) 97.9 F (36.6 C)  97.4 F (36.3 C)  TempSrc: Oral Oral  Oral  Resp: 20 20  20   Height:      Weight:  80.922 kg (178 lb 6.4 oz)    SpO2: 95% 93%  98%    Intake/Output Summary (Last 24 hours) at 06/09/14 1612 Last data filed at 06/09/14 1456  Gross per 24 hour  Intake   1500 ml  Output   4355 ml  Net  -2855 ml   Filed Weights   06/08/14 1425 06/08/14 1839 06/09/14 0523  Weight: 88.905 kg (196 lb) 84.9 kg (187 lb 2.7 oz) 80.922 kg (178 lb 6.4 oz)    Exam:   General:  Awake, in  nad  Cardiovascular: regular, s1, s2  Respiratory: normal resp effort, no wheezing  Abdomen: soft,nondistended  Musculoskeletal: perfused, no clubbing   Data Reviewed: Basic Metabolic Panel:  Recent Labs Lab 06/08/14 1444 06/09/14 0722  NA 136 141  K 5.0 4.9  CL 111 109  CO2 20 23  GLUCOSE 104* 85  BUN 30* 30*  CREATININE 2.15* 2.22*  CALCIUM 9.5 10.2   Liver Function Tests: No results for input(s): AST, ALT, ALKPHOS, BILITOT, PROT, ALBUMIN in the last 168 hours. No results for input(s): LIPASE, AMYLASE in the last 168 hours. No results for input(s): AMMONIA in the last 168 hours. CBC:  Recent Labs Lab 06/08/14 1444 06/09/14 0722  WBC 3.3* 2.9*  HGB 9.9* 10.4*  HCT 30.7* 31.9*  MCV 94.8 94.1  PLT 136* 160   Cardiac Enzymes:  Recent Labs Lab 06/08/14 1928 06/09/14 0037 06/09/14 0722  TROPONINI 0.03 0.03 0.03   BNP (last 3 results)  Recent Labs  06/08/14 1444  BNP 558.3*    ProBNP (last 3 results)  Recent Labs  12/15/13 1111  PROBNP 8210.0*    CBG:  Recent Labs Lab 06/08/14 1836 06/09/14 1127  GLUCAP 83 152*    No results found for this or any previous visit (from the past 240 hour(s)).   Studies: Dg Chest 2 View  06/08/2014   CLINICAL DATA:  69 year old male with shortness of breath for 3 days and extremity swelling. Initial encounter.  EXAM:  CHEST  2 VIEW  COMPARISON:  12/15/2013 and earlier.  FINDINGS: Increased interstitial opacity in both lungs. Mildly lower lung volumes. Stable cardiomegaly and mediastinal contours. Sequelae of CABG. Left chest cardiac AICD. No pneumothorax. Small bilateral pleural effusions are new. No acute osseous abnormality identified.  IMPRESSION: Pulmonary interstitial edema with small bilateral pleural effusions.   Electronically Signed   By: Genevie Ann M.D.   On: 06/08/2014 15:20    Scheduled Meds: . amLODipine  10 mg Oral Daily  . aspirin EC  325 mg Oral Daily  . atorvastatin  40 mg Oral Daily  .  carisoprodol  350 mg Oral TID  . carvedilol  6.25 mg Oral BID  . clopidogrel  75 mg Oral Daily  . enoxaparin (LOVENOX) injection  40 mg Subcutaneous Q24H  . furosemide  40 mg Intravenous Q8H  . hydrALAZINE  25 mg Oral BID  . potassium chloride SA  40 mEq Oral BID  . traMADol  50 mg Oral TID  . traZODone  150 mg Oral QHS   Continuous Infusions:   Active Problems:   Diabetes mellitus due to underlying condition   CAD (coronary artery disease)   Chest pain at rest   CKD (chronic kidney disease) stage 3, GFR 30-59 ml/min   COPD (chronic obstructive pulmonary disease)   S/P ICD (internal cardiac defibrillator) procedure, 04/18/12, [employer] device ro ICM   CHF (congestive heart failure)   Acute on chronic combined systolic and diastolic CHF, NYHA class 3   Essential hypertension   Carotid artery disease   CHF exacerbation    CHIU, STEPHEN K  [facility] Pager [phone]. If 7PM-7AM, please contact night-coverage at [email or web address], password [facility] 06/09/2014, 4:12 PM  LOS: 1 day

## 2014-06-09 NOTE — Progress Notes (Signed)
Heart Failure Navigator Consult Note  Presentation: [name] is a 69 y.o. male with PMH of CAD, CABG, ICD, ischemic cardiomyopathy, DM, CKD 3, HTN, COPD presents to the ER with the above complaints. He went to [facility] heart care today for a carotid duplex when he started experiencing chest pain which he described as sharp, intermittent, and was sent to the ER for further evaluation. He also reports noticing dyspnea with activity for 2 days, increasing lower extremity swelling and >20lb weight gain over the past week. He reports compliance with meds, and salt restriction.  Past Medical History  Diagnosis Date  . Hypertension     2D ECHO, 04/01/2012 - EF [id number], systolic function severely reduced, moderate hypokinesis of the inferolateral, inferior, and inferoseptal myocardium  . Diabetes mellitus   . Chronic combined systolic and diastolic CHF, NYHA class 2     NUC, 11/25/2009 - No evidence for a reversible defect or ischemia, inferior wall infarct with hypokinesia along the inferior wall, EF-34%  . S/P CABG x 3   . Bronchitis   . Chest pain at rest 04/01/2012  . Acute respiratory failure, requiring BiPap, now with diuresing improved. 04/01/2012  . At risk for sudden cardiac death 04-21-12  . Cardiomyopathy, ischemic 04-21-12  . Myocardial infarction   . Coronary artery disease   . Dysrhythmia     LBBB  . Chronic kidney disease     STAGE 3   . Carotid artery disease     status post right internal carotid artery stenting 10/20/13  . Automatic implantable cardioverter-defibrillator in situ   . Arthritis   . Hyperlipidemia   . Shortness of breath     History   Social History  . Marital Status: Divorced    Spouse Name: N/A  . Number of Children: N/A  . Years of Education: N/A   Social History Main Topics  . Smoking status: Former Smoker    Quit date: Apr 21, 1982  . Smokeless tobacco: Never Used  . Alcohol Use: No  . Drug Use: 7.00 per week    Special: Marijuana  . Sexual  Activity: Not on file   Other Topics Concern  . None   Social History Narrative    ECHO:Study Conclusions-12/16/13  - Left ventricle: There is a false tendon in the LV apex of no clinical significance. The cavity size was normal. There was mild concentric hypertrophy. Systolic function was mildly to moderately reduced. The estimated ejection fraction was in the range of 40% to 45%. There is akinesis of the basalinferoseptal myocardium. There is akinesis of the basal-midinferior myocardium. There is hypokinesis of the apical myocardium. - Ventricular septum: Septal motion showed paradox. - Aortic valve: Mild diffuse calcification, consistent with sclerosis. There was trivial regurgitation. - Mitral valve: There was mild regurgitation. - Left atrium: The atrium was mildly dilated. - Right ventricle: The cavity size was mildly dilated. Wall thickness was normal. - Tricuspid valve: There was mild regurgitation. - Pulmonary arteries: PA peak pressure: 59 mm Hg (S).  Impressions:  - The right ventricular systolic pressure was increased consistent with moderate pulmonary hypertension.  BNP    Component Value Date/Time   BNP 558.3* 06/08/2014 1444    ProBNP    Component Value Date/Time   PROBNP 8210.0* 12/15/2013 1111     Education Assessment and Provision:  Detailed education and instructions provided on heart failure disease management including the following:  Signs and symptoms of Heart Failure When to call the physician Importance of daily weights Low  sodium diet Fluid restriction Medication management Anticipated future follow-up appointments  Patient education given on each of the above topics.  Patient acknowledges understanding and acceptance of all instructions.  I spoke at length with patient and his sister regarding HF.  He lives alone in [place].  He is able to teach back most topics listed above.  He has a scale, weighs himself daily  and can relate why it is important.  I reviewed when to call the physician related to signs and symptoms of HF.  He admits that he may have recently been eating high sodium foods.  We discussed a low sodium diet and high sodium foods to avoid.  He does admit to being out of Lasix currently at home.  I reinforced the importance of taking all medications as prescribed and to inform the doctor when medications run out.  He does not drive and walks to the grocery store.  He says he can take the bus or his sister will help him get to appts.  I encouraged him to call with questions related to his HF after his discharge to home.    Education Materials:  "Living Better With Heart Failure" Booklet, Daily Weight Tracker Tool and Heart Failure Educational Video.   High Risk Criteria for Readmission and/or Poor Patient Outcomes:   EF <30%-No 40-45%  2 or more admissions in 6 months- Yes  Difficult social situation- No--however lives alone  Demonstrates medication noncompliance- No-however does say that he is out of Lasix currently at home   Barriers of Care:  Knowledge of HF, compliance  Discharge Planning:   Plans to discharge to home alone-he would benefit from [facility] for ongoing HF education as well as compliance reinforcement.

## 2014-06-10 ENCOUNTER — Telehealth: Payer: Self-pay | Admitting: Cardiology

## 2014-06-10 LAB — BASIC METABOLIC PANEL
ANION GAP: 9 (ref 5–15)
BUN: 40 mg/dL — ABNORMAL HIGH (ref 6–23)
CHLORIDE: 105 mmol/L (ref 96–112)
CO2: 23 mmol/L (ref 19–32)
CREATININE: 2.52 mg/dL — AB (ref 0.50–1.35)
Calcium: 9.5 mg/dL (ref 8.4–10.5)
GFR calc Af Amer: 29 mL/min — ABNORMAL LOW (ref >90)
GFR, EST NON AFRICAN AMERICAN: 25 mL/min — AB (ref >90)
Glucose, Bld: 87 mg/dL (ref 70–99)
Potassium: 5.2 mmol/L — ABNORMAL HIGH (ref 3.5–5.1)
Sodium: 137 mmol/L (ref 135–145)

## 2014-06-10 LAB — GLUCOSE, CAPILLARY
Glucose-Capillary: 91 mg/dL (ref 70–99)
Glucose-Capillary: 120 mg/dL — ABNORMAL HIGH (ref 70–99)

## 2014-06-10 LAB — HEMOGLOBIN A1C
HEMOGLOBIN A1C: 6.3 % — AB (ref 4.8–5.6)
MEAN PLASMA GLUCOSE: 134 mg/dL

## 2014-06-10 MED ORDER — SORBITOL 70 % SOLN: 30.0000 mL | Status: DC

## 2014-06-10 MED ORDER — ENOXAPARIN SODIUM 30 MG/0.3ML ~~LOC~~ SOLN
30.0000 mg | SUBCUTANEOUS | Status: DC
  Filled 2014-06-10: qty 0.3

## 2014-06-10 MED ORDER — FUROSEMIDE 40 MG PO TABS: 60.0000 mg | ORAL_TABLET | Freq: Every day | ORAL | Status: DC

## 2014-06-10 MED ORDER — BISACODYL 10 MG RE SUPP: 10.0000 mg | Freq: Once | RECTAL | Status: DC

## 2014-06-10 MED ORDER — FUROSEMIDE 20 MG PO TABS: 60.0000 mg | ORAL_TABLET | Freq: Every day | ORAL | Status: DC

## 2014-06-10 NOTE — Discharge Summary (Signed)
Physician Discharge Summary  [name] [id number] DOB: 01/25/1946 DOA: 06/08/2014  PCP: Wenda Low, MD  Admit date: 06/08/2014 Discharge date: 06/10/2014  Time spent: 20 minutes  Recommendations for Outpatient Follow-up:  1. Follow up with PCP in 1-2 weeks 2. Follow up with Cardiology as scheduled  Discharge Diagnoses:  Active Problems:   Diabetes mellitus due to underlying condition   CAD (coronary artery disease)   Chest pain at rest   CKD (chronic kidney disease) stage 3, GFR 30-59 ml/min   COPD (chronic obstructive pulmonary disease)   S/P ICD (internal cardiac defibrillator) procedure, 04/18/12, [employer] device ro ICM   CHF (congestive heart failure)   Acute on chronic combined systolic and diastolic CHF, NYHA class 3   Essential hypertension   Carotid artery disease   CHF exacerbation   Acute exacerbation of CHF (congestive heart failure)   Hypoxia   Discharge Condition: Improved  Diet recommendation: Heart healthy  Filed Weights   06/08/14 1839 06/09/14 0523 06/10/14 0454  Weight: 84.9 kg (187 lb 2.7 oz) 80.922 kg (178 lb 6.4 oz) 78.155 kg (172 lb 4.8 oz)    History of present illness:  Please see admit h and p from 3/29 for details. Briefly, pt presented with chest pains with LE swelling and sob. The patient was admitted for further work up.  Hospital Course:   Acute on chronic combined systolic CHF/ischemic cardiomyopathy -Pt was continued on IV lasix with net neg 2.6L by the day of discharge -Cardiology had been following -ECHo 10/15 with EF of 40-45% -continue Coreg, no ACE due to CKD - Cardiology recs for transition to 60mg  PO lasix daily on discharge  Chest pain -likely due to above, serial trop neg -Pt seen by Cardiology -continued ASA, plavix, coreg, statin -low risk myoview 6/15  CAD/s/p CABG'06 , s/p ICD -per above  DM -diet controlled, check hbaic  CKD (chronic kidney disease) stage 3/4 -stable, monitor with diuresis  COPD  (chronic obstructive pulmonary disease) -stable, nebs PRN  Essential hypertension -continued on hydralazine, coreg, amlodipine  Carotid artery disease -S/P Carotid stent by Dr Gwenlyn Found Aug 2015 -continued plavix   chronic anemia -likely due to CKD -check anemia panel   Consultations:  Cardiology  Discharge Exam: Filed Vitals:   06/10/14 0454 06/10/14 0800 06/10/14 1100 06/10/14 1330  BP: 94/50 117/52 126/61 113/61  Pulse: 63 66 59 72  Temp: 97.5 F (36.4 C) 97.7 F (36.5 C)  97.3 F (36.3 C)  TempSrc: Oral Oral  Oral  Resp: 20 19  18   Height:      Weight: 78.155 kg (172 lb 4.8 oz)     SpO2: 95% 100%      General: Awake, in nad Cardiovascular: regular, s1, s2 Respiratory: normal resp effort, no wheezing  Discharge Instructions     Medication List    STOP taking these medications        levofloxacin 750 MG tablet  Commonly known as:  LEVAQUIN     potassium chloride SA 20 MEQ tablet  Commonly known as:  K-DUR,KLOR-CON     traMADol 50 MG tablet  Commonly known as:  ULTRAM      TAKE these medications        amLODipine 10 MG tablet  Commonly known as:  NORVASC  Take 10 mg by mouth daily.     aspirin EC 325 MG tablet  Take 325 mg by mouth daily.     atorvastatin 40 MG tablet  Commonly known as:  LIPITOR  Take  40 mg by mouth daily.     carisoprodol 350 MG tablet  Commonly known as:  SOMA  Take 350 mg by mouth 3 (three) times daily.     carvedilol 6.25 MG tablet  Commonly known as:  COREG  Take 6.25 mg by mouth 2 (two) times daily.     clopidogrel 75 MG tablet  Commonly known as:  PLAVIX  Take 75 mg by mouth daily.     diazepam 5 MG tablet  Commonly known as:  VALIUM  Take 1 tablet (5 mg total) by mouth every 12 (twelve) hours as needed for muscle spasms (neck pain).     furosemide 20 MG tablet  Commonly known as:  LASIX  Take 3 tablets (60 mg total) by mouth daily.  Start taking on:  06/11/2014     guaiFENesin 600 MG 12 hr tablet   Commonly known as:  MUCINEX  Take 1 tablet (600 mg total) by mouth 2 (two) times daily.     hydrALAZINE 25 MG tablet  Commonly known as:  APRESOLINE  Take 25 mg by mouth 2 (two) times daily.     Ipratropium-Albuterol 20-100 MCG/ACT Aers respimat  Commonly known as:  COMBIVENT  Inhale 1 puff into the lungs every 6 (six) hours as needed for wheezing or shortness of breath.     oxyCODONE-acetaminophen 10-325 MG per tablet  Commonly known as:  PERCOCET  Take 1 tablet by mouth every 8 (eight) hours as needed for pain.     traZODone 150 MG tablet  Commonly known as:  DESYREL  Take 150 mg by mouth at bedtime.       No Known Allergies Follow-up Information    Follow up with HUSAIN,KARRAR, MD. Schedule an appointment as soon as possible for a visit in 1 week.   Specialty:  Internal Medicine   Why:  Hospital follow up   Contact information:   [address] [place] [address] [phone]       Follow up with You will be called to schedule an appointment with Cardiology.   Why:  Hospital follow up       The results of significant diagnostics from this hospitalization (including imaging, microbiology, ancillary and laboratory) are listed below for reference.    Significant Diagnostic Studies: Dg Chest 2 View  06/08/2014   CLINICAL DATA:  69 year old male with shortness of breath for 3 days and extremity swelling. Initial encounter.  EXAM: CHEST  2 VIEW  COMPARISON:  12/15/2013 and earlier.  FINDINGS: Increased interstitial opacity in both lungs. Mildly lower lung volumes. Stable cardiomegaly and mediastinal contours. Sequelae of CABG. Left chest cardiac AICD. No pneumothorax. Small bilateral pleural effusions are new. No acute osseous abnormality identified.  IMPRESSION: Pulmonary interstitial edema with small bilateral pleural effusions.   Electronically Signed   By: Genevie Ann M.D.   On: 06/08/2014 15:20    Microbiology: No results found for this or any previous  visit (from the past 240 hour(s)).   Labs: Basic Metabolic Panel:  Recent Labs Lab 06/08/14 1444 06/09/14 0722 06/10/14 0605  NA 136 141 137  K 5.0 4.9 5.2*  CL 111 109 105  CO2 20 23 23   GLUCOSE 104* 85 87  BUN 30* 30* 40*  CREATININE 2.15* 2.22* 2.52*  CALCIUM 9.5 10.2 9.5   Liver Function Tests: No results for input(s): AST, ALT, ALKPHOS, BILITOT, PROT, ALBUMIN in the last 168 hours. No results for input(s): LIPASE, AMYLASE in the last 168  hours. No results for input(s): AMMONIA in the last 168 hours. CBC:  Recent Labs Lab 06/08/14 1444 06/09/14 0722  WBC 3.3* 2.9*  HGB 9.9* 10.4*  HCT 30.7* 31.9*  MCV 94.8 94.1  PLT 136* 160   Cardiac Enzymes:  Recent Labs Lab 06/08/14 1928 06/09/14 0037 06/09/14 0722  TROPONINI 0.03 0.03 0.03   BNP: BNP (last 3 results)  Recent Labs  06/08/14 1444  BNP 558.3*    ProBNP (last 3 results)  Recent Labs  12/15/13 1111  PROBNP 8210.0*    CBG:  Recent Labs Lab 06/08/14 1836 06/09/14 1127 06/09/14 1601 06/10/14 0739 06/10/14 1137  GLUCAP 83 152* 131* 91 120*    Signed:  CHIU, STEPHEN K  [facility] 06/10/2014, 2:10 PM

## 2014-06-10 NOTE — Telephone Encounter (Signed)
New message      TCM appt on 06-17-14 with Cecilie Kicks per Lorretta Harp

## 2014-06-10 NOTE — Progress Notes (Signed)
Telemetry monitor has been removed.  Iv has been removed.  Explained discharge instructions and medications to pt, and he had no further questions at this time.  Pt is in no acute distress, and has been discharged.

## 2014-06-10 NOTE — Discharge Instructions (Signed)
Follow up: April 7th at 9:30am with Cecilie Kicks at [facility] office

## 2014-06-10 NOTE — Progress Notes (Signed)
Patient Profile: 69 year old male with multiple medical issues including peripheral vascular disease, coronary artery disease status post bypass, ischemic cardiomyopathy , COPD, smoker admitted with chest pain and acute systolic heart failure.  Subjective: Feels significantly better. Breathing improved. CP resolved.  Feeling well and anxious to leave.   Objective: Vital signs in last 24 hours: Temp:  [97.4 F (36.3 C)-97.9 F (36.6 C)] 97.7 F (36.5 C) (03/31 0800) Pulse Rate:  [59-66] 59 (03/31 1100) Resp:  [19-20] 19 (03/31 0800) BP: (94-143)/(50-64) 126/61 mmHg (03/31 1100) SpO2:  [95 %-100 %] 100 % (03/31 0800) Weight:  [172 lb 4.8 oz (78.155 kg)] 172 lb 4.8 oz (78.155 kg) (03/31 0454) Last BM Date: 06/10/14  Intake/Output from previous day: 03/30 0701 - 03/31 0700 In: 1920 [P.O.:1920] Out: 2026 [Urine:2025; Stool:1] Intake/Output this shift: Total I/O In: 600 [P.O.:600] Out: 400 [Urine:400]  Medications Current Facility-Administered Medications  Medication Dose Route Frequency Provider Last Rate Last Dose  . acetaminophen (TYLENOL) tablet 650 mg  650 mg Oral Q6H PRN Domenic Polite, MD       Or  . acetaminophen (TYLENOL) suppository 650 mg  650 mg Rectal Q6H PRN Domenic Polite, MD      . amLODipine (NORVASC) tablet 10 mg  10 mg Oral Daily Domenic Polite, MD   10 mg at 06/09/14 1012  . aspirin EC tablet 325 mg  325 mg Oral Daily Domenic Polite, MD   325 mg at 06/10/14 1107  . atorvastatin (LIPITOR) tablet 40 mg  40 mg Oral Daily Domenic Polite, MD   40 mg at 06/10/14 1106  . carisoprodol (SOMA) tablet 350 mg  350 mg Oral TID Domenic Polite, MD   350 mg at 06/10/14 1107  . carvedilol (COREG) tablet 6.25 mg  6.25 mg Oral BID Domenic Polite, MD   6.25 mg at 06/10/14 1112  . clopidogrel (PLAVIX) tablet 75 mg  75 mg Oral Daily Domenic Polite, MD   75 mg at 06/10/14 1106  . diazepam (VALIUM) tablet 5 mg  5 mg Oral Q12H PRN Domenic Polite, MD      . enoxaparin (LOVENOX)  injection 40 mg  40 mg Subcutaneous Q24H Donalynn Furlong [place], RPH   40 mg at 06/09/14 2053  . furosemide (LASIX) injection 40 mg  40 mg Intravenous Q8H Domenic Polite, MD   40 mg at 06/10/14 0700  . hydrALAZINE (APRESOLINE) tablet 25 mg  25 mg Oral BID Domenic Polite, MD   25 mg at 06/10/14 1112  . ipratropium-albuterol (DUONEB) 0.5-2.5 (3) MG/3ML nebulizer solution 3 mL  3 mL Inhalation Q6H PRN Domenic Polite, MD      . ondansetron (ZOFRAN) tablet 4 mg  4 mg Oral Q6H PRN Domenic Polite, MD       Or  . ondansetron (ZOFRAN) injection 4 mg  4 mg Intravenous Q6H PRN Domenic Polite, MD      . oxyCODONE-acetaminophen (PERCOCET/ROXICET) 5-325 MG per tablet 1 tablet  1 tablet Oral Q8H PRN Domenic Polite, MD       And  . oxyCODONE (Oxy IR/ROXICODONE) immediate release tablet 5 mg  5 mg Oral Q8H PRN Domenic Polite, MD      . traZODone (DESYREL) tablet 150 mg  150 mg Oral QHS Domenic Polite, MD   150 mg at 06/09/14 2211    PE: General appearance: alert, cooperative and no distress Neck: no carotid bruit and no JVD Lungs: clear to auscultation bilaterally Heart: regular rate and rhythm, S1, S2 normal, no murmur, click, rub or  gallop Extremities No LE Edema Pulses: 2+ and symmetric Skin: warm and dry Neurologic: Grossly normal  Filed Weights   06/08/14 1839 06/09/14 0523 06/10/14 0454  Weight: 187 lb 2.7 oz (84.9 kg) 178 lb 6.4 oz (80.922 kg) 172 lb 4.8 oz (78.155 kg)   Lab Results:   Recent Labs  06/08/14 1444 06/09/14 0722  WBC 3.3* 2.9*  HGB 9.9* 10.4*  HCT 30.7* 31.9*  PLT 136* 160   BMET  Recent Labs  06/08/14 1444 06/09/14 0722 06/10/14 0605  NA 136 141 137  K 5.0 4.9 5.2*  CL 111 109 105  CO2 20 23 23   GLUCOSE 104* 85 87  BUN 30* 30* 40*  CREATININE 2.15* 2.22* 2.52*  CALCIUM 9.5 10.2 9.5   PT/INR No results for input(s): LABPROT, INR in the last 72 hours. Cholesterol No results for input(s): CHOL in the last 72 hours. Cardiac Panel (last 3 results)  Recent Labs   06/08/14 1928 06/09/14 0037 06/09/14 0722  TROPONINI 0.03 0.03 0.03   Assessment/Plan  Active Problems:   Diabetes mellitus due to underlying condition   CAD (coronary artery disease)   Chest pain at rest   CKD (chronic kidney disease) stage 3, GFR 30-59 ml/min   COPD (chronic obstructive pulmonary disease)   S/P ICD (internal cardiac defibrillator) procedure, 04/18/12, Medtronic device ro ICM   CHF (congestive heart failure)   Acute on chronic combined systolic and diastolic CHF, NYHA class 3   Essential hypertension   Carotid artery disease   CHF exacerbation   Acute exacerbation of CHF (congestive heart failure)   Hypoxia   69 year old male with multiple medical issues including peripheral vascular disease, coronary artery disease status post bypass, ischemic cardiomyopathy , COPD, smoker admitted with chest pain and acute systolic heart failure.  1. Acute on Chronic Systolic CHF:  EF is as high as 40-45% on most recent echocardiogram however has been in the 25-35% range in the past. He has an ICD. He has had good UOP on IV Lasix.  I/Os net -3.L since admit. Breathing and LEE both improved. Weight is down 24lbs. Now at 172 lb. He appears euvolemic -- Will stop IV lasix today and resume PO dosing ( he take 40mg  qd at home). We usually like to watch patients for a day on oral therapy but patient is very adamant about leaving today. Will defer dispo to Dr. Burt Knack. He may be able to leave today with close OP in the clinic. Dietary sodium intake may be part of the problem. Discussed avoidance of processed meats, potato chips and canned foods.  -- BP and electrolytes stable. Continue to monitor renal function closely. -- Continue strict I/Os, daily weights and low sodium diet.   -- Continue Coreg. No ACE/ARB given CKD. Continue hydralazine for afterload reduction. BP is controlled.  2. Chest Pain: CP resolved with diuresis. Recent reassuring nuclear stress test less than one year ago. EKG  does not show any new changes although interventricular conduction delay makes it challenging to decipher ischemic changes. However, cardiac enzymes are negative x 3. No further work-up indicated.  --Continue ASA, statin and BB.  3.  Chronic kidney disease: stage 3/4. Creatinine is slightly increased from October from 1.79 up to 2.15. Further increase in past 24 hrs to 2.52. He is on IV Lasix for diuresis for HF, but also on scheduled tramadol TID. Pt does not take this at home. He is not in any pain. Unsure why this is scheduled. Would use  caution with tramadol use given renal disease. ( this has now been discontinued)  4. HTN: BP controlled. Continue amlodipine, carvedilol and hydralazine.   5. Carotid artery disease: carotid artery stenting 10/2013, continue dual antiplatelet therapy. No strokelike symptoms.    LOS: 2 days    Angelena Form, PA-C 06/10/2014 11:28 AM   Patient seen, examined. Available data reviewed. Agree with findings, assessment, and plan as outlined by Nell Range, PA-C. Pt feels well and eager to go home. Lungs clear and no peripheral edema. OK for d/c today with close outpatient follow-up. Will arrange a PA/NP visit for Dr Gwenlyn Found next week. Would send out on furosemide 60 mg daily.  Sherren Mocha, M.D. 06/10/2014 1:56 PM

## 2014-06-10 NOTE — Progress Notes (Signed)
Tech offered Pt a bath, Pt stated he will take care of bath later.

## 2014-06-11 NOTE — Telephone Encounter (Signed)
Left message for pt to call.

## 2014-06-11 NOTE — Telephone Encounter (Signed)
Patient contacted regarding discharge from [facility] on 06/10/14  Patient understands to follow up with provider laura ingold np on 06/17/14 at 9:30 am [address]. Patient understands discharge instructions? yes Patient understands medications and regiment? yes Patient understands to bring all medications to this visit? yes

## 2014-06-17 ENCOUNTER — Encounter: Payer: Medicare Other | Admitting: Cardiology

## 2014-07-05 ENCOUNTER — Ambulatory Visit (INDEPENDENT_AMBULATORY_CARE_PROVIDER_SITE_OTHER): Payer: Medicare Other | Admitting: Physician Assistant

## 2014-07-05 ENCOUNTER — Encounter: Payer: Self-pay | Admitting: Physician Assistant

## 2014-07-05 ENCOUNTER — Telehealth: Payer: Self-pay | Admitting: *Deleted

## 2014-07-05 VITALS — BP 130/62 | HR 62 | Ht 68.0 in | Wt 186.0 lb

## 2014-07-05 DIAGNOSIS — I2510 Atherosclerotic heart disease of native coronary artery without angina pectoris: Secondary | ICD-10-CM | POA: Diagnosis not present

## 2014-07-05 DIAGNOSIS — I6529 Occlusion and stenosis of unspecified carotid artery: Secondary | ICD-10-CM

## 2014-07-05 DIAGNOSIS — I255 Ischemic cardiomyopathy: Secondary | ICD-10-CM

## 2014-07-05 DIAGNOSIS — I5043 Acute on chronic combined systolic (congestive) and diastolic (congestive) heart failure: Secondary | ICD-10-CM | POA: Diagnosis not present

## 2014-07-05 DIAGNOSIS — I10 Essential (primary) hypertension: Secondary | ICD-10-CM

## 2014-07-05 DIAGNOSIS — I509 Heart failure, unspecified: Secondary | ICD-10-CM

## 2014-07-05 DIAGNOSIS — N183 Chronic kidney disease, stage 3 unspecified: Secondary | ICD-10-CM

## 2014-07-05 DIAGNOSIS — N179 Acute kidney failure, unspecified: Secondary | ICD-10-CM | POA: Diagnosis not present

## 2014-07-05 DIAGNOSIS — Z95810 Presence of automatic (implantable) cardiac defibrillator: Secondary | ICD-10-CM

## 2014-07-05 LAB — BASIC METABOLIC PANEL
BUN: 38 mg/dL — ABNORMAL HIGH (ref 6–23)
CO2: 20 mEq/L (ref 19–32)
Calcium: 9.8 mg/dL (ref 8.4–10.5)
Chloride: 113 mEq/L — ABNORMAL HIGH (ref 96–112)
Creatinine, Ser: 2.32 mg/dL — ABNORMAL HIGH (ref 0.40–1.50)
GFR: 36.21 mL/min — ABNORMAL LOW (ref >60.00)
GLUCOSE: 72 mg/dL (ref 70–99)
POTASSIUM: 6.2 meq/L — AB (ref 3.5–5.1)
Sodium: 137 mEq/L (ref 135–145)

## 2014-07-05 MED ORDER — FUROSEMIDE 40 MG PO TABS: 40.0000 mg | ORAL_TABLET | Freq: Two times a day (BID) | ORAL | Status: DC

## 2014-07-05 NOTE — Assessment & Plan Note (Signed)
Shunt has 14 pound weight gain and fluid overload. Will check renal function today. He may end up in the hospital requiring IV diuresis.
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
## 2014-07-05 NOTE — Assessment & Plan Note (Signed)
Stable without chest pain 

## 2014-07-05 NOTE — Progress Notes (Signed)
If Dr. Jerilynn Mages. Croitoru ordered wed that is fine.

## 2014-07-05 NOTE — Telephone Encounter (Signed)
Rec'd Critical Value call from lab for K+ = 6.2. Reviewed by Truitt Merle, NP. Follow orders obtained: 1) STOP Potassium, 2) Stay on Lasix, as told, 3) Check BMET on Thursday, April 28th.  Called patient and informed him of his lab results, especially the Critically Elevated Potassium. Discussed orders advised by Truitt Merle, NP above.  He stated he did refill his Lasix today and he will continue to take it as ordered. He will STOP taking the Potassium and he will come by this Thursday for lab work. Patient instructed on side effects of elevated potassium levels. Patient verbalized understanding and agreement with treatment care plan.

## 2014-07-05 NOTE — Assessment & Plan Note (Addendum)
Patient has acute on chronic heart failure with 14 pound weight gain since discharge. He is confused over his medications and I'm not sure he is taking them properly. He lives alone and is eating a lot of salt. Recommend [facility]  or home health. Weigh himself daily. 2 g sodium diet. Increase Lasix to 80 mg in the morning 40 in the evening for 3 days then 40 mg twice a day. Check BNP and BMP today and next week. Follow-up with Dr.Berry or  Kerin Ransom, PA next week. If patient does not lose weight or renal function worsens he may need to come to the hospital for IV diuresis.

## 2014-07-05 NOTE — Patient Instructions (Addendum)
Medication Instructions:   START TAKING LASIX 40 MG TWICE A DAY  STARTING 07/09/14   Special  Instructions:   TAKE 2 TABLETS 40 MG WHICH  80 MG  TONIGHT  07/05/14  STARTING TOMORROW  07/06/14 TO 07/08/14    TAKE LASIX 40 MG 2 PILLS 80 MG  IN AM AND I PILL 40 MG IN PM FOR 3 DAYS    Labwork: BMET TODAY   Testing/Procedures: NONE TODAY   Follow-Up:  WITH DR BERRY 07/07/14   Any Other Special Instructions Will Be Listed Below (If Applicable). WEIGH YOUR SELF DAILY AND KEEP RECORD      Low-Sodium Eating Plan Sodium raises blood pressure and causes water to be held in the body. Getting less sodium from food will help lower your blood pressure, reduce any swelling, and protect your heart, liver, and kidneys. We get sodium by adding salt (sodium chloride) to food. Most of our sodium comes from canned, boxed, and frozen foods. Restaurant foods, fast foods, and pizza are also very high in sodium. Even if you take medicine to lower your blood pressure or to reduce fluid in your body, getting less sodium from your food is important. WHAT IS MY PLAN? Most people should limit their sodium intake to 2,300 mg a day. Your health care provider recommends that you limit your sodium intake to __________ a day.  WHAT DO I NEED TO KNOW ABOUT THIS EATING PLAN? For the low-sodium eating plan, you will follow these general guidelines:  Choose foods with a % Daily Value for sodium of less than 5% (as listed on the food label).   Use salt-free seasonings or herbs instead of table salt or sea salt.   Check with your health care provider or pharmacist before using salt substitutes.   Eat fresh foods.  Eat more vegetables and fruits.  Limit canned vegetables. If you do use them, rinse them well to decrease the sodium.   Limit cheese to 1 oz (28 g) per day.   Eat lower-sodium products, often labeled as "lower sodium" or "no salt added."  Avoid foods that contain monosodium glutamate (MSG). MSG  is sometimes added to [place] food and some canned foods.  Check food labels (Nutrition Facts labels) on foods to learn how much sodium is in one serving.  Eat more home-cooked food and less restaurant, buffet, and fast food.  When eating at a restaurant, ask that your food be prepared with less salt or none, if possible.  HOW DO I READ FOOD LABELS FOR SODIUM INFORMATION? The Nutrition Facts label lists the amount of sodium in one serving of the food. If you eat more than one serving, you must multiply the listed amount of sodium by the number of servings. Food labels may also identify foods as:  Sodium free--Less than 5 mg in a serving.  Very low sodium--35 mg or less in a serving.  Low sodium--140 mg or less in a serving.  Light in sodium--50% less sodium in a serving. For example, if a food that usually has 300 mg of sodium is changed to become light in sodium, it will have 150 mg of sodium.  Reduced sodium--25% less sodium in a serving. For example, if a food that usually has 400 mg of sodium is changed to reduced sodium, it will have 300 mg of sodium. WHAT FOODS CAN I EAT? Grains Low-sodium cereals, including oats, puffed wheat and rice, and shredded wheat cereals. Low-sodium crackers. Unsalted rice and pasta. Lower-sodium bread.  Vegetables Frozen or fresh vegetables. Low-sodium or reduced-sodium canned vegetables. Low-sodium or reduced-sodium tomato sauce and paste. Low-sodium or reduced-sodium tomato and vegetable juices.  Fruits Fresh, frozen, and canned fruit. Fruit juice.  Meat and Other Protein Products Low-sodium canned tuna and salmon. Fresh or frozen meat, poultry, seafood, and fish. Lamb. Unsalted nuts. Dried beans, peas, and lentils without added salt. Unsalted canned beans. Homemade soups without salt. Eggs.  Dairy Milk. Soy milk. Ricotta cheese. Low-sodium or reduced-sodium cheeses. Yogurt.  Condiments Fresh and dried herbs and spices. Salt-free  seasonings. Onion and garlic powders. Low-sodium varieties of mustard and ketchup. Lemon juice.  Fats and Oils Reduced-sodium salad dressings. Unsalted butter.  Other Unsalted popcorn and pretzels.  The items listed above may not be a complete list of recommended foods or beverages. Contact your dietitian for more options. WHAT FOODS ARE NOT RECOMMENDED? Grains Instant hot cereals. Bread stuffing, pancake, and biscuit mixes. Croutons. Seasoned rice or pasta mixes. Noodle soup cups. Boxed or frozen macaroni and cheese. Self-rising flour. Regular salted crackers. Vegetables Regular canned vegetables. Regular canned tomato sauce and paste. Regular tomato and vegetable juices. Frozen vegetables in sauces. Salted french fries. Olives. Angie Fava. Relishes. Sauerkraut. Salsa. Meat and Other Protein Products Salted, canned, smoked, spiced, or pickled meats, seafood, or fish. Bacon, ham, sausage, hot dogs, corned beef, chipped beef, and packaged luncheon meats. Salt pork. Jerky. Pickled herring. Anchovies, regular canned tuna, and sardines. Salted nuts. Dairy Processed cheese and cheese spreads. Cheese curds. Blue cheese and cottage cheese. Buttermilk.  Condiments Onion and garlic salt, seasoned salt, table salt, and sea salt. Canned and packaged gravies. Worcestershire sauce. Tartar sauce. Barbecue sauce. Teriyaki sauce. Soy sauce, including reduced sodium. Steak sauce. Fish sauce. Oyster sauce. Cocktail sauce. Horseradish. Regular ketchup and mustard. Meat flavorings and tenderizers. Bouillon cubes. Hot sauce. Tabasco sauce. Marinades. Taco seasonings. Relishes. Fats and Oils Regular salad dressings. Salted butter. Margarine. Ghee. Bacon fat.  Other Potato and tortilla chips. Corn chips and puffs. Salted popcorn and pretzels. Canned or dried soups. Pizza. Frozen entrees and pot pies.  The items listed above may not be a complete list of foods and beverages to avoid. Contact your dietitian  for more information. Document Released: 08/18/2001 Document Revised: 03/03/2013 Document Reviewed: 12/31/2012 [facility] Patient Information 2015 [place], [place]. This information is not intended to replace advice given to you by your health care provider. Make sure you discuss any questions you have with your health care provider.

## 2014-07-05 NOTE — Assessment & Plan Note (Signed)
Controlled.  

## 2014-07-07 ENCOUNTER — Encounter: Payer: Self-pay | Admitting: Cardiovascular Disease

## 2014-07-07 ENCOUNTER — Ambulatory Visit (INDEPENDENT_AMBULATORY_CARE_PROVIDER_SITE_OTHER): Payer: Medicare Other | Admitting: Cardiovascular Disease

## 2014-07-07 VITALS — BP 136/60 | HR 63 | Ht 68.5 in | Wt 177.0 lb

## 2014-07-07 DIAGNOSIS — Z79899 Other long term (current) drug therapy: Secondary | ICD-10-CM | POA: Diagnosis not present

## 2014-07-07 DIAGNOSIS — I255 Ischemic cardiomyopathy: Secondary | ICD-10-CM | POA: Diagnosis not present

## 2014-07-07 DIAGNOSIS — I509 Heart failure, unspecified: Secondary | ICD-10-CM | POA: Diagnosis not present

## 2014-07-07 DIAGNOSIS — I10 Essential (primary) hypertension: Secondary | ICD-10-CM | POA: Diagnosis not present

## 2014-07-07 DIAGNOSIS — I2510 Atherosclerotic heart disease of native coronary artery without angina pectoris: Secondary | ICD-10-CM | POA: Diagnosis not present

## 2014-07-07 DIAGNOSIS — E785 Hyperlipidemia, unspecified: Secondary | ICD-10-CM

## 2014-07-07 DIAGNOSIS — I252 Old myocardial infarction: Secondary | ICD-10-CM

## 2014-07-07 DIAGNOSIS — I2583 Coronary atherosclerosis due to lipid rich plaque: Secondary | ICD-10-CM

## 2014-07-07 LAB — BASIC METABOLIC PANEL
BUN: 60 mg/dL — ABNORMAL HIGH (ref 6–23)
CALCIUM: 9.8 mg/dL (ref 8.4–10.5)
CO2: 16 mEq/L — ABNORMAL LOW (ref 19–32)
CREATININE: 3.58 mg/dL — AB (ref 0.50–1.35)
Chloride: 107 mEq/L (ref 96–112)
Glucose, Bld: 127 mg/dL — ABNORMAL HIGH (ref 70–99)
Potassium: 5 mEq/L (ref 3.5–5.3)
SODIUM: 140 meq/L (ref 135–145)

## 2014-07-07 NOTE — Telephone Encounter (Signed)
Thanks for your note. Can you have patient come in today for repeat BMET? Also, was [facility] or home health arranged? Patient is high risk for readmission an didn't know what meds he was taking.  Thanks, Ermalinda Barrios PA-C

## 2014-07-07 NOTE — Patient Instructions (Signed)
Have blood work done today at the lab downstairs instead of at [facility].   Dr Gwenlyn Found has referred you to [facility] for a nutrientional consult.   We request that you follow-up in: 4-6 weeks with an extender and in 3 months with Dr Andria Rhein will receive a reminder letter in the mail two months in advance. If you don't receive a letter, please call our office to schedule the follow-up appointment.

## 2014-07-07 NOTE — Assessment & Plan Note (Signed)
History of carotid artery artery disease status post right artery stenting by myself 10/20/13 for high-grade right internal carotid artery stenosis. His follow-up carotid Dopplers performed 06/08/14 revealed his right carotid widely patent with mild left ICA stenosis. He is on dual antiplatelet therapy including aspirin and Plavix

## 2014-07-07 NOTE — Assessment & Plan Note (Signed)
History of hyperlipidemia on atorvastatin 40 mg a day followed by his PCP 

## 2014-07-07 NOTE — Assessment & Plan Note (Signed)
History of hypertension blood pressure measured at 136/60. He is on amlodipine, carvedilol and hydralazine. Continue current meds at current dosing

## 2014-07-07 NOTE — Telephone Encounter (Signed)
Spoke to patient. He is currently at Dr. Kennon Holter office. Informed him that Ermalinda Barrios, PA, is requesting him to get his BMET drawn today instead of tomorrow. Spoke to [place] at Dr. Kennon Holter office. She will put in the new order for BMET with [facility] so that patient can get it drawn while he is at [facility] office. I will also submit Referral Request to [facility], per Ermalinda Barrios, PA.

## 2014-07-07 NOTE — Assessment & Plan Note (Signed)
History of coronary artery disease status post  Coronary artery bypass grafting in 2006 with a LIMA to his LAD, vein to the circumflex and PDA. He has an EF in the 40-45% range with a recent Myoview in June of last year that was nonischemic. He has been admitted with volume overload requiring IV diuresis.

## 2014-07-07 NOTE — Assessment & Plan Note (Signed)
[name] has had several minute admissions for volume overload. He does have an EF in the 40% range. He admits to dietary indiscretion and probably poor indication compliance. He was recently admitted and diuresed last month and supposedly discharged home on Lasix 60 mg a day however he is not short what his medication dosage was. He saw Margarite Gouge, PA at [facility] yesterday. He had gained over 10 pounds and was placed on Lasix 40 mg by mouth twice a day. I'm going to refer him to a dietitian for counseling regarding reduced salt intake.

## 2014-07-07 NOTE — Telephone Encounter (Signed)
Can you make sure this patient was referred to [facility]. Also have bmet today for high potassium. Can you call me with results today or tomorrow when they come back?  Thanks, Selinda Eon

## 2014-07-07 NOTE — Progress Notes (Signed)
07/07/2014 [name]   Jul 30, 1945  [id number]  Primary Physician Wenda Low, MD Primary Cardiologist: Lorretta Harp MD Renae Gloss   HPI:  Mr. Zeltser is a 69 year old thin-appearing African American male history of CAD status post bypass grafting x3 in 2006 with a LIMA to his LAD, vein to circumflex and PDA. His other problems include COPD with tobacco abuse, diabetes, hypertension, left bundle branch block and chronic kidney disease. He had an IV ICD placed in February 2014 followup by Dr. Sallyanne Kuster in our office. He denies chest pain or shortness of breath.recent carotid Doppler suggested a high-grade right internal carotid artery stenosis. Myoview stress test performed 09/02/13 showed no ischemia. He apparently is scheduled for upcoming surgical procedure which is certainly at high risk for given his carotid disease and his LV dysfunction. I referred him to Dr. Trula Slade for vascular surgical evaluation. He agreed that Mr. Vedros was a better stent candidate. He has been on aspirin and Plavix. He will need his tonsillar cancer evaluated by Dr. Erik Obey Post carotid stenting. I H. Fatima Sanger him 10/20/13 revealing an 80% proximal right internal carotid artery stenosis which which I stented along with Dr. Trula Slade using a 9 x 7 x 30 XACT Nitinol self-expanding stent and distal protection.  He is on dual antiplatelet therapy.follow-up carotid Dopplers performed 3/29 sex 83 revealed his carotids have to be widely patent. He was recently admitted with volume overload/congestive heart failure requiring IV diuresis from 06/08/14 through the 31st. He saw Margarite Gouge PA-C in the [facility] office yesterday with recurring volume overload and his diuretics were adjusted.   Current Outpatient Prescriptions  Medication Sig Dispense Refill  . amLODipine (NORVASC) 10 MG tablet Take 10 mg by mouth daily.    Marland Kitchen aspirin EC 325 MG tablet Take 325 mg by mouth daily.    Marland Kitchen atorvastatin (LIPITOR)  40 MG tablet Take 40 mg by mouth daily.     . carisoprodol (SOMA) 350 MG tablet Take 350 mg by mouth 3 (three) times daily.    . carvedilol (COREG) 6.25 MG tablet Take 6.25 mg by mouth 2 (two) times daily.     . clopidogrel (PLAVIX) 75 MG tablet Take 75 mg by mouth daily.    . diazepam (VALIUM) 5 MG tablet Take 1 tablet (5 mg total) by mouth every 12 (twelve) hours as needed for muscle spasms (neck pain). 20 tablet 0  . furosemide (LASIX) 40 MG tablet Take 1 tablet (40 mg total) by mouth 2 (two) times daily. 60 tablet 6  . guaiFENesin (MUCINEX) 600 MG 12 hr tablet Take 1 tablet (600 mg total) by mouth 2 (two) times daily. 40 tablet 0  . hydrALAZINE (APRESOLINE) 25 MG tablet Take 25 mg by mouth 2 (two) times daily.    . Ipratropium-Albuterol (COMBIVENT) 20-100 MCG/ACT AERS respimat Inhale 1 puff into the lungs every 6 (six) hours as needed for wheezing or shortness of breath. 1 Inhaler 3  . oxyCODONE-acetaminophen (PERCOCET) 10-325 MG per tablet Take 1 tablet by mouth every 8 (eight) hours as needed for pain.     . traZODone (DESYREL) 150 MG tablet Take 150 mg by mouth at bedtime.     No current facility-administered medications for this visit.    No Known Allergies  History   Social History  . Marital Status: Divorced    Spouse Name: N/A  . Number of Children: N/A  . Years of Education: N/A   Occupational History  . Not on  file.   Social History Main Topics  . Smoking status: Former Smoker    Quit date: 04/18/1982  . Smokeless tobacco: Never Used  . Alcohol Use: No  . Drug Use: 7.00 per week    Special: Marijuana  . Sexual Activity: Not on file   Other Topics Concern  . Not on file   Social History Narrative     Review of Systems: General: negative for chills, fever, night sweats or weight changes.  Cardiovascular: negative for chest pain, dyspnea on exertion, edema, orthopnea, palpitations, paroxysmal nocturnal dyspnea or shortness of breath Dermatological: negative for  rash Respiratory: negative for cough or wheezing Urologic: negative for hematuria Abdominal: negative for nausea, vomiting, diarrhea, bright red blood per rectum, melena, or hematemesis Neurologic: negative for visual changes, syncope, or dizziness All other systems reviewed and are otherwise negative except as noted above.    Blood pressure 136/60, pulse 63, height 5' 8.5" (1.74 m), weight 177 lb (80.287 kg).  General appearance: alert and no distress Neck: no adenopathy, no JVD, supple, symmetrical, trachea midline, thyroid not enlarged, symmetric, no tenderness/mass/nodules and soft right carotid bruit Lungs: clear to auscultation bilaterally Heart: regular rate and rhythm, S1, S2 normal, no murmur, click, rub or gallop Extremities: extremities normal, atraumatic, no cyanosis or edema  EKG normal sinus rhythm at 63 with left bundle branch block. I preserved reviewed this EKG  ASSESSMENT AND PLAN:   MYOCARDIAL INFARCTION, HX OF History of coronary artery disease status post  Coronary artery bypass grafting in 2006 with a LIMA to his LAD, vein to the circumflex and PDA. He has an EF in the 40-45% range with a recent Myoview in June of last year that was nonischemic. He has been admitted with volume overload requiring IV diuresis.   Hyperlipidemia History of hyperlipidemia on atorvastatin 40 mg a day followed by his PCP   History of carotid artery stenosis History of carotid artery artery disease status post right artery stenting by myself 10/20/13 for high-grade right internal carotid artery stenosis. His follow-up carotid Dopplers performed 06/08/14 revealed his right carotid widely patent with mild left ICA stenosis. He is on dual antiplatelet therapy including aspirin and Plavix   Essential hypertension History of hypertension blood pressure measured at 136/60. He is on amlodipine, carvedilol and hydralazine. Continue current meds at current dosing   CHF exacerbation Mr.  Leilani Merl has had several minute admissions for volume overload. He does have an EF in the 40% range. He admits to dietary indiscretion and probably poor indication compliance. He was recently admitted and diuresed last month and supposedly discharged home on Lasix 60 mg a day however he is not short what his medication dosage was. He saw Margarite Gouge, PA at [facility] yesterday. He had gained over 10 pounds and was placed on Lasix 40 mg by mouth twice a day. I'm going to refer him to a dietitian for counseling regarding reduced salt intake.       Lorretta Harp MD FACP,FACC,FAHA, [facility] 07/07/2014 12:47 PM

## 2014-07-07 NOTE — Telephone Encounter (Signed)
Referral to [facility] faxed to [phone]. Demographic sheet and History/Last Office note submitted with referral.

## 2014-07-12 ENCOUNTER — Telehealth: Payer: Self-pay | Admitting: *Deleted

## 2014-07-12 DIAGNOSIS — I10 Essential (primary) hypertension: Secondary | ICD-10-CM

## 2014-07-12 NOTE — Telephone Encounter (Signed)
Pt's daughter returned call and gave new number for pt [phone].  I placed call to this number and left message on pt's identified voicemail to call office. Phone number updated in chart.

## 2014-07-12 NOTE — Telephone Encounter (Signed)
Left another message for pt to call office.

## 2014-07-12 NOTE — Telephone Encounter (Signed)
Spoke with Ermalinda Barrios, PA and pt needs office visit with Truitt Merle, NP today. Also needs BMET at this appt.  Need to know what diuretic pt is currently on.  I spoke with Cecille Rubin and she can see pt today at 17 or 2.  There are 2 numbers listed in outgoing contact section for pt.  I was able to leave message on [phone] number to call office but no answer on other number listed for pt.

## 2014-07-12 NOTE — Telephone Encounter (Signed)
Tried again to reach pt but call will not go through.  I left message on contacts for pt (daughter and brother) to have pt call office.

## 2014-07-13 ENCOUNTER — Other Ambulatory Visit: Payer: Self-pay

## 2014-07-13 DIAGNOSIS — I5042 Chronic combined systolic (congestive) and diastolic (congestive) heart failure: Secondary | ICD-10-CM

## 2014-07-13 NOTE — Patient Outreach (Signed)
[facility]) Care Management  07/13/2014  [name] [birth year] [id number]   RN CM spoke with patient to discuss the services of [facility].  RN CM explained to patient that a referral was made by his doctor.  Patient states the doctor told him someone would be calling him.  Patient agrees to accept the services of [facility].  Patient agrees to schedule an appointment for community nurse to make a home assessment visit.  Patient has knowledge deficits related to understanding and managing his medications.  Patient would benefit from nutrition education as he frequently eats out at fast food restaurants.  Patient would benefit from education on self-management of his chronic diseases: heart failure, COPD, diabetes and hypertension.  Patient is high risk for readmission.  RN CM will make referral for  [facility] community nurse.   Maury Dus, RN, Ishmael Holter, [place] Telephonic Care Coordinator [phone]

## 2014-07-16 ENCOUNTER — Other Ambulatory Visit: Payer: Self-pay

## 2014-07-16 NOTE — Telephone Encounter (Signed)
Finally was able to get in touch with [name].  Scheduled him an appointment to be seen Mon 5/9 at 10:30 with BMET prior to [place].  Advised to bring all his medicines but he states he has throw all of his bottles out but has current list of medications and will bring that. He gave another phone # also besides [phone]; another # [phone].

## 2014-07-16 NOTE — Patient Outreach (Signed)
[facility]) Care Management  07/16/2014  [name] 1945/08/24 [id number]  Call to schedule home visit. No answer. HIPPA compliant message left. Care Manager will attempt to call next week.  [name], RN, MSN, [place] Coordinator Cell: [phone]

## 2014-07-19 ENCOUNTER — Ambulatory Visit (INDEPENDENT_AMBULATORY_CARE_PROVIDER_SITE_OTHER): Payer: Medicare Other | Admitting: Physician Assistant

## 2014-07-19 ENCOUNTER — Encounter: Payer: Self-pay | Admitting: Physician Assistant

## 2014-07-19 VITALS — BP 118/62 | HR 67 | Ht 68.0 in | Wt 178.4 lb

## 2014-07-19 DIAGNOSIS — I5042 Chronic combined systolic (congestive) and diastolic (congestive) heart failure: Secondary | ICD-10-CM | POA: Diagnosis not present

## 2014-07-19 DIAGNOSIS — I255 Ischemic cardiomyopathy: Secondary | ICD-10-CM | POA: Diagnosis not present

## 2014-07-19 DIAGNOSIS — N183 Chronic kidney disease, stage 3 unspecified: Secondary | ICD-10-CM

## 2014-07-19 DIAGNOSIS — I10 Essential (primary) hypertension: Secondary | ICD-10-CM | POA: Diagnosis not present

## 2014-07-19 DIAGNOSIS — N289 Disorder of kidney and ureter, unspecified: Secondary | ICD-10-CM

## 2014-07-19 LAB — BASIC METABOLIC PANEL
BUN: 41 mg/dL — ABNORMAL HIGH (ref 6–23)
CO2: 27 mEq/L (ref 19–32)
Calcium: 10.2 mg/dL (ref 8.4–10.5)
Chloride: 106 mEq/L (ref 96–112)
Creatinine, Ser: 2.31 mg/dL — ABNORMAL HIGH (ref 0.40–1.50)
GFR: 36.39 mL/min — ABNORMAL LOW (ref >60.00)
Glucose, Bld: 98 mg/dL (ref 70–99)
Potassium: 4.6 mEq/L (ref 3.5–5.1)
Sodium: 138 mEq/L (ref 135–145)

## 2014-07-19 MED ORDER — FUROSEMIDE 40 MG PO TABS: 40.0000 mg | ORAL_TABLET | Freq: Two times a day (BID) | ORAL | Status: DC

## 2014-07-19 NOTE — Assessment & Plan Note (Signed)
Patient's creatinine went from 2.32 up to 3.58. We've been trying to get a hold of the patient for 2 weeks to have this rechecked. Today's the first day he came back in. We will adjust his Lasix as needed.

## 2014-07-19 NOTE — Patient Instructions (Signed)
Medication Instructions:  Your physician recommends that you continue on your current medications as directed. Please refer to the Current Medication list given to you today.   Labwork: BMET TO CHECK YOUR KIDNEYS  Testing/Procedures:   Follow-Up:   IN 2 TO 3 MONTHS WITH DR BERRY  Any Other Special Instructions Will Be Listed Below (If Applicable).
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
## 2014-07-19 NOTE — Assessment & Plan Note (Signed)
BP stable.

## 2014-07-19 NOTE — Assessment & Plan Note (Signed)
Patient's heart failure is currently compensated. His weight is stable. He has a lot of trouble with compliance issues. He thinks he is taking Lasix 40 mg twice a day but throughout all his medicine bottles. We are working with [facility] to help him as an outpatient. He also has renal failure. We will be checking his kidney function today and adjust his Lasix as needed.

## 2014-07-26 ENCOUNTER — Other Ambulatory Visit: Payer: Self-pay

## 2014-07-26 NOTE — Patient Outreach (Addendum)
[facility]) Care Management  07/26/2014  [name] 15-Jan-1946 [id number]   Assessment-Call to assess/schedule home Visit. No answer. HIPPA compliant message left - (2nd attempt).  Plan: call  within 1-2 days.  [name], RN, MSN, [place] Coordinator Cell: [phone]

## 2014-07-27 ENCOUNTER — Other Ambulatory Visit: Payer: Self-pay

## 2014-07-27 NOTE — Patient Outreach (Signed)
[facility]) Care Management  07/27/2014  Cheston Rosenquist [birth year] [id number]  Assessment: Telephonic outreach to schedule home visit. No answer. HIPPA compliant message left. 3rd attempt-  Plan: Send outreach letter and Close case if no response within 10 business days.  [name], RN, MSN, [place] Coordinator Cell: [phone]

## 2014-07-29 ENCOUNTER — Emergency Department (INDEPENDENT_AMBULATORY_CARE_PROVIDER_SITE_OTHER)
Admission: EM | Admit: 2014-07-29 | Discharge: 2014-07-29 | Disposition: A | Discharge status: Home / Self Care | Payer: Medicare Other | Source: Home / Self Care | Attending: Family Medicine | Admitting: Family Medicine

## 2014-07-29 ENCOUNTER — Encounter (HOSPITAL_COMMUNITY): Payer: Self-pay | Admitting: Emergency Medicine

## 2014-07-29 DIAGNOSIS — J040 Acute laryngitis: Secondary | ICD-10-CM

## 2014-07-29 LAB — POCT RAPID STREP A: Streptococcus, Group A Screen (Direct): NEGATIVE

## 2014-07-29 MED ORDER — DEXAMETHASONE 4 MG PO TABS
ORAL_TABLET | ORAL | Status: AC
  Filled 2014-07-29: qty 2

## 2014-07-29 MED ORDER — DEXAMETHASONE 2 MG PO TABS
10.0000 mg | ORAL_TABLET | Freq: Once | ORAL | Status: AC
  Administered 2014-07-29: 10 mg via ORAL

## 2014-07-29 MED ORDER — DEXAMETHASONE 2 MG PO TABS
ORAL_TABLET | ORAL | Status: AC
  Filled 2014-07-29: qty 1

## 2014-07-29 NOTE — ED Notes (Addendum)
C/o sore throat and loss of voice.  Pain with swallowing.  Symptoms present x 3 days.  No relief with otc meds.  Denies fever, n/v/d
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
## 2014-07-29 NOTE — Discharge Instructions (Signed)
The cause of your throat symptoms is not immediately clear but may be due to a viral infection, bacterial infection or from spasm from marijuana use. Your given a dose of steroids to help with your symptoms. Please also start using a daily allergy medicine such as Zyrtec or Allegra. Please avoid marijuana use. Please use the Cepacol lozenges or other throat pain medicine for additional relief. We will call you if you're strep test is positive.

## 2014-07-31 LAB — CULTURE, GROUP A STREP: STREP A CULTURE: NEGATIVE

## 2014-08-05 ENCOUNTER — Telehealth: Payer: Self-pay | Admitting: *Deleted

## 2014-08-05 ENCOUNTER — Emergency Department (HOSPITAL_COMMUNITY)
Admission: EM | Admit: 2014-08-05 | Discharge: 2014-08-05 | Disposition: A | Discharge status: Home / Self Care | Payer: Medicare Other | Attending: Emergency Medicine | Admitting: Emergency Medicine

## 2014-08-05 ENCOUNTER — Emergency Department (HOSPITAL_COMMUNITY): Payer: Medicare Other

## 2014-08-05 ENCOUNTER — Encounter (HOSPITAL_COMMUNITY): Payer: Self-pay | Admitting: Emergency Medicine

## 2014-08-05 DIAGNOSIS — M19072 Primary osteoarthritis, left ankle and foot: Secondary | ICD-10-CM | POA: Insufficient documentation

## 2014-08-05 DIAGNOSIS — I2510 Atherosclerotic heart disease of native coronary artery without angina pectoris: Secondary | ICD-10-CM | POA: Insufficient documentation

## 2014-08-05 DIAGNOSIS — I129 Hypertensive chronic kidney disease with stage 1 through stage 4 chronic kidney disease, or unspecified chronic kidney disease: Secondary | ICD-10-CM | POA: Diagnosis not present

## 2014-08-05 DIAGNOSIS — M79672 Pain in left foot: Secondary | ICD-10-CM | POA: Diagnosis present

## 2014-08-05 DIAGNOSIS — Z951 Presence of aortocoronary bypass graft: Secondary | ICD-10-CM | POA: Diagnosis not present

## 2014-08-05 DIAGNOSIS — Z95810 Presence of automatic (implantable) cardiac defibrillator: Secondary | ICD-10-CM | POA: Insufficient documentation

## 2014-08-05 DIAGNOSIS — Z7902 Long term (current) use of antithrombotics/antiplatelets: Secondary | ICD-10-CM | POA: Diagnosis not present

## 2014-08-05 DIAGNOSIS — I5042 Chronic combined systolic (congestive) and diastolic (congestive) heart failure: Secondary | ICD-10-CM | POA: Diagnosis not present

## 2014-08-05 DIAGNOSIS — Z7982 Long term (current) use of aspirin: Secondary | ICD-10-CM | POA: Insufficient documentation

## 2014-08-05 DIAGNOSIS — E119 Type 2 diabetes mellitus without complications: Secondary | ICD-10-CM | POA: Insufficient documentation

## 2014-08-05 DIAGNOSIS — I252 Old myocardial infarction: Secondary | ICD-10-CM | POA: Diagnosis not present

## 2014-08-05 DIAGNOSIS — N183 Chronic kidney disease, stage 3 (moderate): Secondary | ICD-10-CM | POA: Diagnosis not present

## 2014-08-05 DIAGNOSIS — E785 Hyperlipidemia, unspecified: Secondary | ICD-10-CM | POA: Diagnosis not present

## 2014-08-05 DIAGNOSIS — Z79899 Other long term (current) drug therapy: Secondary | ICD-10-CM | POA: Insufficient documentation

## 2014-08-05 DIAGNOSIS — Z87891 Personal history of nicotine dependence: Secondary | ICD-10-CM | POA: Diagnosis not present

## 2014-08-05 DIAGNOSIS — Z9889 Other specified postprocedural states: Secondary | ICD-10-CM | POA: Diagnosis not present

## 2014-08-05 LAB — CBG MONITORING, ED: Glucose-Capillary: 90 mg/dL (ref 65–99)

## 2014-08-05 MED ORDER — OXYCODONE-ACETAMINOPHEN 5-325 MG PO TABS
1.0000 | ORAL_TABLET | Freq: Once | ORAL | Status: AC
  Administered 2014-08-05: 1 via ORAL
  Filled 2014-08-05: qty 1

## 2014-08-05 MED ORDER — OXYCODONE-ACETAMINOPHEN 5-325 MG PO TABS: 1.0000 | ORAL_TABLET | Freq: Four times a day (QID) | ORAL | Status: DC | PRN

## 2014-08-05 MED ORDER — OXYCODONE-ACETAMINOPHEN 5-325 MG PO TABS: 1.0000 | ORAL_TABLET | Freq: Once | ORAL | Status: DC

## 2014-08-05 NOTE — Discharge Instructions (Signed)
Return to the ED with any concerns including increased pain, redness or swelling of foot or toes, fever/chills, or any other alarming symptoms
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
## 2014-08-05 NOTE — ED Notes (Signed)
Dr. Linker at bedside  

## 2014-08-05 NOTE — Telephone Encounter (Signed)
Pharmacy called to clarify directions for Rx.

## 2014-08-05 NOTE — ED Notes (Addendum)
Pt reports swelling and pain to L foot x 3 days. Denies injury, fevers, chills. PMS intact. Pt is a diabetic.

## 2014-08-10 DIAGNOSIS — N184 Chronic kidney disease, stage 4 (severe): Secondary | ICD-10-CM | POA: Diagnosis not present

## 2014-08-10 DIAGNOSIS — D696 Thrombocytopenia, unspecified: Secondary | ICD-10-CM | POA: Diagnosis not present

## 2014-08-10 DIAGNOSIS — G8929 Other chronic pain: Secondary | ICD-10-CM | POA: Diagnosis not present

## 2014-08-10 DIAGNOSIS — E1122 Type 2 diabetes mellitus with diabetic chronic kidney disease: Secondary | ICD-10-CM | POA: Diagnosis not present

## 2014-08-10 DIAGNOSIS — M79672 Pain in left foot: Secondary | ICD-10-CM | POA: Diagnosis not present

## 2014-08-10 DIAGNOSIS — Z95810 Presence of automatic (implantable) cardiac defibrillator: Secondary | ICD-10-CM | POA: Diagnosis not present

## 2014-08-10 DIAGNOSIS — I252 Old myocardial infarction: Secondary | ICD-10-CM | POA: Diagnosis not present

## 2014-08-10 DIAGNOSIS — I10 Essential (primary) hypertension: Secondary | ICD-10-CM | POA: Diagnosis not present

## 2014-08-10 DIAGNOSIS — I5032 Chronic diastolic (congestive) heart failure: Secondary | ICD-10-CM | POA: Diagnosis not present

## 2014-08-10 DIAGNOSIS — E782 Mixed hyperlipidemia: Secondary | ICD-10-CM | POA: Diagnosis not present

## 2014-08-13 NOTE — Patient Outreach (Signed)
[facility]) Care Management  08/13/2014  Volney Buter 07-04-1945 [id number]  Assessment: Attempted to reach member telephonically three times unsuccessful. Outreach letter sent and no response in 10 days.   Plan: Close case. Send letter to primary care and send letter to member.  [name], RN, MSN, [place] Coordinator Cell: [phone]

## 2014-08-17 NOTE — Patient Outreach (Signed)
[facility]) Care Management  08/17/2014  Finnbar Varda 09-18-1945 [id number]   Notification from [name], RN to close case due to unable to contact patient.  Ronnell Freshwater. [place], [place] Management [place] Assistant Phone: [phone] Fax: [phone]

## 2014-08-23 ENCOUNTER — Encounter: Payer: Self-pay | Admitting: Cardiology

## 2014-08-23 ENCOUNTER — Ambulatory Visit (INDEPENDENT_AMBULATORY_CARE_PROVIDER_SITE_OTHER): Payer: Medicare Other | Admitting: Cardiology

## 2014-08-23 VITALS — BP 140/64 | HR 56 | Ht 68.0 in | Wt 188.0 lb

## 2014-08-23 DIAGNOSIS — I2510 Atherosclerotic heart disease of native coronary artery without angina pectoris: Secondary | ICD-10-CM | POA: Diagnosis not present

## 2014-08-23 DIAGNOSIS — I10 Essential (primary) hypertension: Secondary | ICD-10-CM

## 2014-08-23 DIAGNOSIS — I5022 Chronic systolic (congestive) heart failure: Secondary | ICD-10-CM | POA: Diagnosis not present

## 2014-08-23 DIAGNOSIS — N183 Chronic kidney disease, stage 3 unspecified: Secondary | ICD-10-CM

## 2014-08-23 DIAGNOSIS — I255 Ischemic cardiomyopathy: Secondary | ICD-10-CM

## 2014-08-23 DIAGNOSIS — I2583 Coronary atherosclerosis due to lipid rich plaque: Secondary | ICD-10-CM

## 2014-08-23 NOTE — Patient Instructions (Addendum)
Weigh daily Call [phone] if weight climbs more than 3 pounds in a day or 5 pounds in a week. No salt to very little salt in your diet.  No more than 2000 mg in a day. Call if increased shortness of breath or increased swelling.   Resume Lasix 40 mg 2 tablets daily   Your physician recommends that you schedule a follow-up appointment in: Dr.Berry 10/06/14 at 10:30 am
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
## 2014-09-06 DIAGNOSIS — M109 Gout, unspecified: Secondary | ICD-10-CM | POA: Diagnosis not present

## 2014-09-06 DIAGNOSIS — R490 Dysphonia: Secondary | ICD-10-CM | POA: Diagnosis not present

## 2014-10-06 ENCOUNTER — Encounter: Payer: Self-pay | Admitting: Cardiovascular Disease

## 2014-10-06 ENCOUNTER — Ambulatory Visit (INDEPENDENT_AMBULATORY_CARE_PROVIDER_SITE_OTHER): Payer: Medicare Other | Admitting: Cardiovascular Disease

## 2014-10-06 VITALS — BP 122/64 | HR 64 | Ht 68.0 in | Wt 192.3 lb

## 2014-10-06 DIAGNOSIS — I6529 Occlusion and stenosis of unspecified carotid artery: Secondary | ICD-10-CM | POA: Diagnosis not present

## 2014-10-06 DIAGNOSIS — I255 Ischemic cardiomyopathy: Secondary | ICD-10-CM | POA: Diagnosis not present

## 2014-10-06 DIAGNOSIS — I10 Essential (primary) hypertension: Secondary | ICD-10-CM | POA: Diagnosis not present

## 2014-10-06 DIAGNOSIS — Z8679 Personal history of other diseases of the circulatory system: Secondary | ICD-10-CM

## 2014-10-06 DIAGNOSIS — I5042 Chronic combined systolic (congestive) and diastolic (congestive) heart failure: Secondary | ICD-10-CM | POA: Diagnosis not present

## 2014-10-06 NOTE — Assessment & Plan Note (Signed)
Status post ICD placed February 2014 for primary prevention. This is followed by Dr. Sallyanne Kuster.

## 2014-10-06 NOTE — Patient Instructions (Addendum)
Medication Instructions:   n/a  Labwork:  n/a  Testing/Procedures:  Carotid Duplex (to be done in March 2017)- This test is an ultrasound of the carotid arteries in your neck. It looks at blood flow through these arteries that supply the brain with blood. Allow one hour for this exam. There are no restrictions or special instructions.   Follow-Up:  Dr Sallyanne Kuster on his next device day clinic 6 months with an extender  1 year with Dr Gwenlyn Found  Any Other Special Instructions Will Be Listed Below (If Applicable).

## 2014-10-06 NOTE — Progress Notes (Signed)
10/06/2014 [name]   August 31, 1945  [id number]  Primary Physician Wenda Low, MD Primary Cardiologist: Lorretta Harp MD Renae Gloss   HPI:  [name] is a 69year old thin-appearing African American male history of CAD status post bypass grafting x3 in 2006 with a LIMA to his LAD, vein to circumflex and PDA. His other problems include COPD with tobacco abuse, diabetes, hypertension, left bundle branch block and chronic kidney disease. He had an IV ICD placed in February 2014 followup by Dr. Sallyanne Kuster in our office. He denies chest pain or shortness of breath.recent carotid Doppler suggested a high-grade right internal carotid artery stenosis. Myoview stress test performed 09/02/13 showed no ischemia. He apparently is scheduled for upcoming surgical procedure which is certainly at high risk for given his carotid disease and his LV dysfunction. I referred him to Dr. Trula Slade for vascular surgical evaluation. He agreed that Mr. Bensman was a better stent candidate. He has been on aspirin and Plavix. He will need his tonsillar cancer evaluated by Dr. Erik Obey Post carotid stenting. I H. Fatima Sanger him 10/20/13 revealing an 80% proximal right internal carotid artery stenosis which which I stented along with Dr. Trula Slade using a 9 x 7 x 30 XACT Nitinol self-expanding stent and distal protection. He is on dual antiplatelet therapy.follow-up carotid Dopplers performed 06/08/14 revealed his carotids have to be widely patent. He was recently admitted with volume overload/congestive heart failure requiring IV diuresis from 06/08/14 through the 31st. He saw Margarite Gouge PA-C in the [facility] office yesterday with recurring volume overload and his diuretics were adjusted.a left him in the office 07/07/14. He's remained stable since that time denying chest pain or shortness of breath.   Current Outpatient Prescriptions  Medication Sig Dispense Refill  . amLODipine (NORVASC) 10 MG tablet Take 10  mg by mouth daily.    Marland Kitchen aspirin EC 325 MG tablet Take 325 mg by mouth daily.    Marland Kitchen atorvastatin (LIPITOR) 40 MG tablet Take 40 mg by mouth daily.     . carvedilol (COREG) 6.25 MG tablet Take 6.25 mg by mouth 2 (two) times daily.     . clopidogrel (PLAVIX) 75 MG tablet Take 75 mg by mouth daily.    . diazepam (VALIUM) 5 MG tablet Take 1 tablet (5 mg total) by mouth every 12 (twelve) hours as needed for muscle spasms (neck pain). 20 tablet 0  . furosemide (LASIX) 40 MG tablet Take 1 tablet (40 mg total) by mouth 2 (two) times daily. 60 tablet 6  . glimepiride (AMARYL) 1 MG tablet Take 1 tablet by mouth daily. Take 1 tab daily    . guaiFENesin (MUCINEX) 600 MG 12 hr tablet Take 1 tablet (600 mg total) by mouth 2 (two) times daily. 40 tablet 0  . hydrALAZINE (APRESOLINE) 25 MG tablet Take 25 mg by mouth 2 (two) times daily.    . Ipratropium-Albuterol (COMBIVENT) 20-100 MCG/ACT AERS respimat Inhale 1 puff into the lungs every 6 (six) hours as needed for wheezing or shortness of breath. 1 Inhaler 3  . oxyCODONE-acetaminophen (PERCOCET) 10-325 MG per tablet Take 1 tablet by mouth 3 (three) times daily. Take 1 tab three times a day  0  . traZODone (DESYREL) 150 MG tablet Take 150 mg by mouth at bedtime.     No current facility-administered medications for this visit.    No Known Allergies  History   Social History  . Marital Status: Divorced    Spouse Name: N/A  .  Number of Children: N/A  . Years of Education: N/A   Occupational History  . Not on file.   Social History Main Topics  . Smoking status: Former Smoker    Quit date: 04/18/1982  . Smokeless tobacco: Never Used  . Alcohol Use: No  . Drug Use: 7.00 per week    Special: Marijuana  . Sexual Activity: Not on file   Other Topics Concern  . Not on file   Social History Narrative     Review of Systems: General: negative for chills, fever, night sweats or weight changes.  Cardiovascular: negative for chest pain, dyspnea on  exertion, edema, orthopnea, palpitations, paroxysmal nocturnal dyspnea or shortness of breath Dermatological: negative for rash Respiratory: negative for cough or wheezing Urologic: negative for hematuria Abdominal: negative for nausea, vomiting, diarrhea, bright red blood per rectum, melena, or hematemesis Neurologic: negative for visual changes, syncope, or dizziness All other systems reviewed and are otherwise negative except as noted above.    Blood pressure 122/64, pulse 64, height 5\' 8"  (1.727 m), weight 192 lb 4.8 oz (87.227 kg).  General appearance: alert and no distress Neck: no adenopathy, no carotid bruit, no JVD, supple, symmetrical, trachea midline and thyroid not enlarged, symmetric, no tenderness/mass/nodules Lungs: clear to auscultation bilaterally Heart: regular rate and rhythm, S1, S2 normal, no murmur, click, rub or gallop Extremities: extremities normal, atraumatic, no cyanosis or edema  EKG not performed today  ASSESSMENT AND PLAN:   S/P ICD (internal cardiac defibrillator) procedure, 04/18/12, Medtronic device ro ICM Status post ICD placed February 2014 for primary prevention. This is followed by Dr. Sallyanne Kuster.  MYOCARDIAL INFARCTION, HX OF History of CAD status post coronary artery bypass grafting in 2006 with a LIMA to his LAD, vein to the circumflex and PDA. He does have moderate LV dysfunction. A Myoview stress test performed 09/02/13 showed no ischemia.  Hyperlipidemia History of hyperlipidemia on atorvastatin 40 mg a day followed by his PCP  History of carotid artery stenosis History of high-grade right internal carotid artery stenosis status post carotid stenting 10/20/13 with a 9/7 mm x 30 mm long XACT diagnosis of expanding stent using distal protection. Follow-up Dopplers performed in March of this year revealed a widely patent stent.  Essential hypertension History of hypertension blood pressure measured at 122/64. He is on amlodipine, carvedilol and  hydralazine. Continue current meds at current dosing  Chronic combined systolic and diastolic CHF (congestive heart failure) The patient was hospitalized in March of this year with congestive heart failure and was diuresed. He has remained stable on 40 mg of Lasix twice a day and watch his salt intake. His weight has remained stable.      Lorretta Harp MD FACP,FACC,FAHA, [facility] 10/06/2014 11:05 AM

## 2014-10-06 NOTE — Assessment & Plan Note (Signed)
History of high-grade right internal carotid artery stenosis status post carotid stenting 10/20/13 with a 9/7 mm x 30 mm long XACT diagnosis of expanding stent using distal protection. Follow-up Dopplers performed in March of this year revealed a widely patent stent.

## 2014-10-06 NOTE — Assessment & Plan Note (Signed)
History of CAD status post coronary artery bypass grafting in 2006 with a LIMA to his LAD, vein to the circumflex and PDA. He does have moderate LV dysfunction. A Myoview stress test performed 09/02/13 showed no ischemia.

## 2014-10-06 NOTE — Assessment & Plan Note (Signed)
History of hyperlipidemia on atorvastatin 40 mg a day followed by his PCP 

## 2014-10-06 NOTE — Assessment & Plan Note (Signed)
History of hypertension blood pressure measured at 122/64. He is on amlodipine, carvedilol and hydralazine. Continue current meds at current dosing

## 2014-10-06 NOTE — Assessment & Plan Note (Signed)
The patient was hospitalized in March of this year with congestive heart failure and was diuresed. He has remained stable on 40 mg of Lasix twice a day and watch his salt intake. His weight has remained stable.

## 2014-10-13 ENCOUNTER — Ambulatory Visit: Payer: Medicare Other | Admitting: Cardiovascular Disease

## 2014-10-18 ENCOUNTER — Ambulatory Visit (HOSPITAL_COMMUNITY)
Admission: RE | Admit: 2014-10-18 | Discharge: 2014-10-18 | Disposition: A | Discharge status: Home / Self Care | Payer: Medicare Other | Source: Ambulatory Visit | Attending: Cardiovascular Disease | Admitting: Cardiovascular Disease

## 2014-10-18 DIAGNOSIS — I6523 Occlusion and stenosis of bilateral carotid arteries: Secondary | ICD-10-CM | POA: Insufficient documentation

## 2014-10-18 DIAGNOSIS — I6529 Occlusion and stenosis of unspecified carotid artery: Secondary | ICD-10-CM | POA: Diagnosis not present

## 2014-10-18 DIAGNOSIS — I659 Occlusion and stenosis of unspecified precerebral artery: Secondary | ICD-10-CM | POA: Diagnosis present

## 2014-10-19 ENCOUNTER — Encounter: Payer: Self-pay | Admitting: *Deleted

## 2014-11-17 DIAGNOSIS — M545 Low back pain: Secondary | ICD-10-CM | POA: Diagnosis not present

## 2014-11-22 ENCOUNTER — Encounter (HOSPITAL_COMMUNITY): Payer: Self-pay | Admitting: Family Medicine

## 2014-11-22 ENCOUNTER — Emergency Department (HOSPITAL_COMMUNITY): Payer: Medicare Other

## 2014-11-22 ENCOUNTER — Inpatient Hospital Stay (HOSPITAL_COMMUNITY)
Admission: EM | Admit: 2014-11-22 | Discharge: 2014-11-26 | DRG: 291 | Disposition: A | Discharge status: Home / Self Care | Payer: Medicare Other | Attending: Internal Medicine | Admitting: Internal Medicine

## 2014-11-22 DIAGNOSIS — I509 Heart failure, unspecified: Secondary | ICD-10-CM | POA: Diagnosis not present

## 2014-11-22 DIAGNOSIS — I5043 Acute on chronic combined systolic (congestive) and diastolic (congestive) heart failure: Principal | ICD-10-CM

## 2014-11-22 DIAGNOSIS — R0602 Shortness of breath: Secondary | ICD-10-CM

## 2014-11-22 DIAGNOSIS — F1290 Cannabis use, unspecified, uncomplicated: Secondary | ICD-10-CM | POA: Diagnosis present

## 2014-11-22 DIAGNOSIS — Z7902 Long term (current) use of antithrombotics/antiplatelets: Secondary | ICD-10-CM | POA: Diagnosis not present

## 2014-11-22 DIAGNOSIS — Z95810 Presence of automatic (implantable) cardiac defibrillator: Secondary | ICD-10-CM | POA: Diagnosis not present

## 2014-11-22 DIAGNOSIS — I5042 Chronic combined systolic (congestive) and diastolic (congestive) heart failure: Secondary | ICD-10-CM | POA: Diagnosis present

## 2014-11-22 DIAGNOSIS — I6521 Occlusion and stenosis of right carotid artery: Secondary | ICD-10-CM | POA: Diagnosis present

## 2014-11-22 DIAGNOSIS — J449 Chronic obstructive pulmonary disease, unspecified: Secondary | ICD-10-CM | POA: Diagnosis present

## 2014-11-22 DIAGNOSIS — N183 Chronic kidney disease, stage 3 unspecified: Secondary | ICD-10-CM | POA: Diagnosis present

## 2014-11-22 DIAGNOSIS — Z794 Long term (current) use of insulin: Secondary | ICD-10-CM

## 2014-11-22 DIAGNOSIS — E785 Hyperlipidemia, unspecified: Secondary | ICD-10-CM | POA: Diagnosis present

## 2014-11-22 DIAGNOSIS — Z79899 Other long term (current) drug therapy: Secondary | ICD-10-CM | POA: Diagnosis not present

## 2014-11-22 DIAGNOSIS — Z992 Dependence on renal dialysis: Secondary | ICD-10-CM

## 2014-11-22 DIAGNOSIS — I129 Hypertensive chronic kidney disease with stage 1 through stage 4 chronic kidney disease, or unspecified chronic kidney disease: Secondary | ICD-10-CM | POA: Diagnosis present

## 2014-11-22 DIAGNOSIS — I5023 Acute on chronic systolic (congestive) heart failure: Secondary | ICD-10-CM | POA: Diagnosis not present

## 2014-11-22 DIAGNOSIS — Z87891 Personal history of nicotine dependence: Secondary | ICD-10-CM | POA: Diagnosis not present

## 2014-11-22 DIAGNOSIS — H10029 Other mucopurulent conjunctivitis, unspecified eye: Secondary | ICD-10-CM | POA: Diagnosis not present

## 2014-11-22 DIAGNOSIS — E1129 Type 2 diabetes mellitus with other diabetic kidney complication: Secondary | ICD-10-CM

## 2014-11-22 DIAGNOSIS — M7989 Other specified soft tissue disorders: Secondary | ICD-10-CM | POA: Diagnosis present

## 2014-11-22 DIAGNOSIS — Z951 Presence of aortocoronary bypass graft: Secondary | ICD-10-CM

## 2014-11-22 DIAGNOSIS — I252 Old myocardial infarction: Secondary | ICD-10-CM

## 2014-11-22 DIAGNOSIS — R188 Other ascites: Secondary | ICD-10-CM

## 2014-11-22 DIAGNOSIS — Z7982 Long term (current) use of aspirin: Secondary | ICD-10-CM | POA: Diagnosis not present

## 2014-11-22 DIAGNOSIS — N186 End stage renal disease: Secondary | ICD-10-CM

## 2014-11-22 DIAGNOSIS — I5032 Chronic diastolic (congestive) heart failure: Secondary | ICD-10-CM | POA: Diagnosis not present

## 2014-11-22 DIAGNOSIS — E089 Diabetes mellitus due to underlying condition without complications: Secondary | ICD-10-CM | POA: Diagnosis present

## 2014-11-22 DIAGNOSIS — N184 Chronic kidney disease, stage 4 (severe): Secondary | ICD-10-CM

## 2014-11-22 DIAGNOSIS — R0609 Other forms of dyspnea: Secondary | ICD-10-CM | POA: Diagnosis not present

## 2014-11-22 DIAGNOSIS — I2510 Atherosclerotic heart disease of native coronary artery without angina pectoris: Secondary | ICD-10-CM | POA: Diagnosis present

## 2014-11-22 DIAGNOSIS — I255 Ischemic cardiomyopathy: Secondary | ICD-10-CM | POA: Diagnosis present

## 2014-11-22 DIAGNOSIS — R140 Abdominal distension (gaseous): Secondary | ICD-10-CM | POA: Diagnosis not present

## 2014-11-22 DIAGNOSIS — F1410 Cocaine abuse, uncomplicated: Secondary | ICD-10-CM | POA: Diagnosis present

## 2014-11-22 DIAGNOSIS — J9601 Acute respiratory failure with hypoxia: Secondary | ICD-10-CM | POA: Diagnosis not present

## 2014-11-22 DIAGNOSIS — E0822 Diabetes mellitus due to underlying condition with diabetic chronic kidney disease: Secondary | ICD-10-CM | POA: Insufficient documentation

## 2014-11-22 DIAGNOSIS — E1122 Type 2 diabetes mellitus with diabetic chronic kidney disease: Secondary | ICD-10-CM | POA: Diagnosis not present

## 2014-11-22 LAB — RAPID URINE DRUG SCREEN, HOSP PERFORMED
AMPHETAMINES: NOT DETECTED
BARBITURATES: NOT DETECTED
BENZODIAZEPINES: NOT DETECTED
Cocaine: NOT DETECTED
Opiates: NOT DETECTED
TETRAHYDROCANNABINOL: NOT DETECTED

## 2014-11-22 LAB — I-STAT TROPONIN, ED: TROPONIN I, POC: 0.03 ng/mL (ref 0.00–0.08)

## 2014-11-22 LAB — CBC
HCT: 31.8 % — ABNORMAL LOW (ref 39.0–52.0)
Hemoglobin: 10.4 g/dL — ABNORMAL LOW (ref 13.0–17.0)
MCH: 30.8 pg (ref 26.0–34.0)
MCHC: 32.7 g/dL (ref 30.0–36.0)
MCV: 94.1 fL (ref 78.0–100.0)
PLATELETS: 137 10*3/uL — AB (ref 150–400)
RBC: 3.38 MIL/uL — AB (ref 4.22–5.81)
RDW: 14.1 % (ref 11.5–15.5)
WBC: 3 10*3/uL — AB (ref 4.0–10.5)

## 2014-11-22 LAB — BASIC METABOLIC PANEL
Anion gap: 10 (ref 5–15)
BUN: 33 mg/dL — AB (ref 6–20)
CHLORIDE: 110 mmol/L (ref 101–111)
CO2: 22 mmol/L (ref 22–32)
Calcium: 9.6 mg/dL (ref 8.9–10.3)
Creatinine, Ser: 2.45 mg/dL — ABNORMAL HIGH (ref 0.61–1.24)
GFR calc non Af Amer: 25 mL/min — ABNORMAL LOW (ref >60)
GFR, EST AFRICAN AMERICAN: 30 mL/min — AB (ref >60)
Glucose, Bld: 69 mg/dL (ref 65–99)
Potassium: 4.2 mmol/L (ref 3.5–5.1)
SODIUM: 142 mmol/L (ref 135–145)

## 2014-11-22 LAB — BRAIN NATRIURETIC PEPTIDE: B Natriuretic Peptide: 583.5 pg/mL — ABNORMAL HIGH (ref 0.0–100.0)

## 2014-11-22 MED ORDER — SODIUM CHLORIDE 0.9 % IJ SOLN
3.0000 mL | INTRAMUSCULAR | Status: DC | PRN
Start: 2014-11-22 — End: 2014-11-26

## 2014-11-22 MED ORDER — ATORVASTATIN CALCIUM 40 MG PO TABS
40.0000 mg | ORAL_TABLET | Freq: Every day | ORAL | Status: DC
  Administered 2014-11-23 – 2014-11-26 (×4): 40 mg via ORAL
  Filled 2014-11-22 (×4): qty 1

## 2014-11-22 MED ORDER — ACETAMINOPHEN 325 MG PO TABS: 650.0000 mg | ORAL_TABLET | ORAL | Status: DC | PRN

## 2014-11-22 MED ORDER — SODIUM CHLORIDE 0.9 % IV SOLN: 250.0000 mL | INTRAVENOUS | Status: DC | PRN

## 2014-11-22 MED ORDER — GLIMEPIRIDE 1 MG PO TABS
1.0000 mg | ORAL_TABLET | Freq: Every day | ORAL | Status: DC
  Filled 2014-11-22: qty 1

## 2014-11-22 MED ORDER — IPRATROPIUM-ALBUTEROL 0.5-2.5 (3) MG/3ML IN SOLN: 3.0000 mL | Freq: Four times a day (QID) | RESPIRATORY_TRACT | Status: DC | PRN

## 2014-11-22 MED ORDER — HYDRALAZINE HCL 25 MG PO TABS
25.0000 mg | ORAL_TABLET | Freq: Two times a day (BID) | ORAL | Status: DC
  Administered 2014-11-23 (×2): 25 mg via ORAL
  Filled 2014-11-22 (×2): qty 1

## 2014-11-22 MED ORDER — FUROSEMIDE 10 MG/ML IJ SOLN
40.0000 mg | Freq: Once | INTRAMUSCULAR | Status: AC
  Administered 2014-11-22: 40 mg via INTRAVENOUS
  Filled 2014-11-22: qty 4

## 2014-11-22 MED ORDER — CLOPIDOGREL BISULFATE 75 MG PO TABS
75.0000 mg | ORAL_TABLET | Freq: Every day | ORAL | Status: DC
  Administered 2014-11-23 – 2014-11-26 (×4): 75 mg via ORAL
  Filled 2014-11-22 (×4): qty 1

## 2014-11-22 MED ORDER — ASPIRIN EC 325 MG PO TBEC
325.0000 mg | DELAYED_RELEASE_TABLET | Freq: Every day | ORAL | Status: DC
  Administered 2014-11-23: 325 mg via ORAL
  Filled 2014-11-22: qty 1

## 2014-11-22 MED ORDER — ONDANSETRON HCL 4 MG/2ML IJ SOLN: 4.0000 mg | Freq: Four times a day (QID) | INTRAMUSCULAR | Status: DC | PRN

## 2014-11-22 MED ORDER — HEPARIN SODIUM (PORCINE) 5000 UNIT/ML IJ SOLN
5000.0000 [IU] | Freq: Three times a day (TID) | INTRAMUSCULAR | Status: DC
  Administered 2014-11-23 – 2014-11-26 (×11): 5000 [IU] via SUBCUTANEOUS
  Filled 2014-11-22 (×11): qty 1

## 2014-11-22 MED ORDER — SODIUM CHLORIDE 0.9 % IJ SOLN
3.0000 mL | Freq: Two times a day (BID) | INTRAMUSCULAR | Status: DC
  Administered 2014-11-23 – 2014-11-26 (×6): 3 mL via INTRAVENOUS

## 2014-11-22 MED ORDER — INSULIN ASPART 100 UNIT/ML ~~LOC~~ SOLN: 0.0000 [IU] | Freq: Every day | SUBCUTANEOUS | Status: DC

## 2014-11-22 MED ORDER — INSULIN ASPART 100 UNIT/ML ~~LOC~~ SOLN
0.0000 [IU] | Freq: Three times a day (TID) | SUBCUTANEOUS | Status: DC
  Administered 2014-11-25: 2 [IU] via SUBCUTANEOUS

## 2014-11-22 MED ORDER — FUROSEMIDE 10 MG/ML IJ SOLN
40.0000 mg | Freq: Two times a day (BID) | INTRAMUSCULAR | Status: DC
  Administered 2014-11-23: 40 mg via INTRAVENOUS
  Filled 2014-11-22 (×2): qty 4

## 2014-11-22 MED ORDER — AMLODIPINE BESYLATE 10 MG PO TABS
10.0000 mg | ORAL_TABLET | Freq: Every day | ORAL | Status: DC
  Administered 2014-11-23: 10 mg via ORAL
  Filled 2014-11-22: qty 1

## 2014-11-22 MED ORDER — TRAZODONE HCL 150 MG PO TABS
150.0000 mg | ORAL_TABLET | Freq: Every evening | ORAL | Status: DC | PRN
  Administered 2014-11-23 – 2014-11-25 (×3): 150 mg via ORAL
  Filled 2014-11-22 (×4): qty 1

## 2014-11-22 MED ORDER — CARVEDILOL 6.25 MG PO TABS
6.2500 mg | ORAL_TABLET | Freq: Two times a day (BID) | ORAL | Status: DC
  Administered 2014-11-23 (×2): 6.25 mg via ORAL
  Filled 2014-11-22 (×2): qty 1

## 2014-11-22 NOTE — ED Notes (Signed)
MD at bedside. 

## 2014-11-22 NOTE — ED Notes (Signed)
Pt updated on wait time. Pt very upset regarding wait time. States that he may leave. Encouraged pt to wait to be seen.
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
## 2014-11-22 NOTE — ED Notes (Signed)
Pt here for SOB and sent here by doctor for CHF. sts he has gained 47 pounds over the past few months. Leg swelling and tightness in his abdomen.

## 2014-11-22 NOTE — ED Notes (Signed)
O2 noted to be 87 upon entering room,placed on 3L [place]. Sats up to 94%

## 2014-11-22 NOTE — Progress Notes (Signed)
Received pt report from [place].

## 2014-11-22 NOTE — ED Notes (Signed)
Dr. Robina Ade at the bedside.
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
## 2014-11-23 ENCOUNTER — Inpatient Hospital Stay (HOSPITAL_COMMUNITY): Payer: Medicare Other

## 2014-11-23 DIAGNOSIS — I5023 Acute on chronic systolic (congestive) heart failure: Secondary | ICD-10-CM

## 2014-11-23 DIAGNOSIS — I5042 Chronic combined systolic (congestive) and diastolic (congestive) heart failure: Secondary | ICD-10-CM

## 2014-11-23 DIAGNOSIS — I509 Heart failure, unspecified: Secondary | ICD-10-CM

## 2014-11-23 DIAGNOSIS — N183 Chronic kidney disease, stage 3 (moderate): Secondary | ICD-10-CM

## 2014-11-23 LAB — BASIC METABOLIC PANEL
ANION GAP: 6 (ref 5–15)
BUN: 30 mg/dL — ABNORMAL HIGH (ref 6–20)
CALCIUM: 9.6 mg/dL (ref 8.9–10.3)
CO2: 24 mmol/L (ref 22–32)
Chloride: 112 mmol/L — ABNORMAL HIGH (ref 101–111)
Creatinine, Ser: 2.16 mg/dL — ABNORMAL HIGH (ref 0.61–1.24)
GFR, EST AFRICAN AMERICAN: 34 mL/min — AB (ref >60)
GFR, EST NON AFRICAN AMERICAN: 30 mL/min — AB (ref >60)
Glucose, Bld: 110 mg/dL — ABNORMAL HIGH (ref 65–99)
POTASSIUM: 4 mmol/L (ref 3.5–5.1)
Sodium: 142 mmol/L (ref 135–145)

## 2014-11-23 LAB — GLUCOSE, CAPILLARY
GLUCOSE-CAPILLARY: 68 mg/dL (ref 65–99)
GLUCOSE-CAPILLARY: 126 mg/dL — AB (ref 65–99)
GLUCOSE-CAPILLARY: 95 mg/dL (ref 65–99)
Glucose-Capillary: 104 mg/dL — ABNORMAL HIGH (ref 65–99)
Glucose-Capillary: 76 mg/dL (ref 65–99)
Glucose-Capillary: 80 mg/dL (ref 65–99)

## 2014-11-23 LAB — TROPONIN I
TROPONIN I: 0.03 ng/mL (ref <0.031)
TROPONIN I: 0.03 ng/mL (ref <0.031)
Troponin I: 0.03 ng/mL (ref <0.031)

## 2014-11-23 LAB — TSH: TSH: 1.246 u[IU]/mL (ref 0.350–4.500)

## 2014-11-23 MED ORDER — GLIMEPIRIDE 1 MG PO TABS
1.0000 mg | ORAL_TABLET | Freq: Every day | ORAL | Status: DC
  Administered 2014-11-23 – 2014-11-24 (×2): 1 mg via ORAL
  Filled 2014-11-23 (×3): qty 1

## 2014-11-23 MED ORDER — ISOSORBIDE MONONITRATE ER 30 MG PO TB24
30.0000 mg | ORAL_TABLET | Freq: Every day | ORAL | Status: DC
  Administered 2014-11-23: 30 mg via ORAL
  Filled 2014-11-23: qty 1

## 2014-11-23 MED ORDER — ISOSORB DINITRATE-HYDRALAZINE 20-37.5 MG PO TABS
1.0000 | ORAL_TABLET | Freq: Three times a day (TID) | ORAL | Status: DC
  Administered 2014-11-23 – 2014-11-26 (×9): 1 via ORAL
  Filled 2014-11-23 (×9): qty 1

## 2014-11-23 MED ORDER — MUPIROCIN CALCIUM 2 % EX CREA
TOPICAL_CREAM | Freq: Three times a day (TID) | CUTANEOUS | Status: DC
Start: 2014-11-23 — End: 2014-11-26
  Administered 2014-11-23 – 2014-11-26 (×10): via TOPICAL
  Filled 2014-11-23: qty 15

## 2014-11-23 MED ORDER — FUROSEMIDE 10 MG/ML IJ SOLN
80.0000 mg | Freq: Two times a day (BID) | INTRAMUSCULAR | Status: DC
  Administered 2014-11-23 – 2014-11-24 (×2): 80 mg via INTRAVENOUS
  Filled 2014-11-23 (×2): qty 8
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
## 2014-11-23 NOTE — Progress Notes (Signed)
  Echocardiogram 2D Echocardiogram has been performed.  [name] 11/23/2014, 5:06 PM

## 2014-11-23 NOTE — Progress Notes (Signed)
Patient Demographics:    [name], is a 69 y.o. male, DOB - 12-Jul-1945, [id number]  Admit date - 11/22/2014   Admitting Physician Geradine Girt, DO  Outpatient Primary MD for the patient is Wenda Low, MD  LOS - 1   Chief Complaint  Patient presents with  . Shortness of Breath        Subjective:    [name] today has, No headache, No chest pain, No abdominal pain - No Nausea, No new weakness tingling or numbness, No Cough - Improved SOB.     Assessment  & Plan :     1. Acute Hypoxic Resp Failure due to combined acute on chronic systolic and diastolic CHF with EF of [id number] on recent echo. Noncompliant in the past, cardiology consulted, currently on Coreg, Lasix, hydralazine combination will add low-dose Imdur. No ACE/ARB due to renal insufficiency. Continue fluid salt restriction, daily weights, monitor intake and output and BMP. Has AICD.   2. CK D4. Baseline creatinine of 2.3. Currently around baseline monitor with ongoing diuresis.   3. DM type II. Pending A1c, continue sliding scale.  Lab Results  Component Value Date   HGBA1C 6.3* 06/08/2014    CBG (last 3)   Recent Labs  11/23/14 0004 11/23/14 0559  GLUCAP 80 104*    4. History of drug abuse. Cocaine in the past, currently daily is negative.    Code Status : Full  Family Communication  : None present  Disposition Plan  : home 2-3 days  Consults  :  Cards  Procedures  :    TTE 12-2013  Left ventricle: There is a false tendon in the LV apex of noclinical significance. The cavity size was normal. There was mild concentric hypertrophy. Systolic function was mildly tomoderately reduced. The estimated ejection fraction was in therange of 40% to 45%. There is akinesis of the basalinferoseptal  myocardium.  There is akinesis of the basal-midinferiormyocardium. There is hypokinesis of the apical myocardium. - Ventricular septum: Septal motion showed paradox. - Aortic valve: Mild diffuse calcification, consistent withsclerosis. There was trivial regurgitation. - Mitral valve: There was mild regurgitation. - Left atrium: The atrium was mildly dilated. - Right ventricle: The cavity size was mildly dilated. Wallthickness was normal. - Tricuspid valve: There was mild regurgitation. - Pulmonary arteries: PA peak pressure: 59 mm Hg (S).   DVT Prophylaxis  :  Heparin   Lab Results  Component Value Date   PLT 137* 11/22/2014    Inpatient Medications  Scheduled Meds: . amLODipine  10 mg Oral Daily  . aspirin EC  325 mg Oral Daily  . atorvastatin  40 mg Oral Daily  . carvedilol  6.25 mg Oral BID  . clopidogrel  75 mg Oral Daily  . furosemide  40 mg Intravenous Q12H  . glimepiride  1 mg Oral Q breakfast  . heparin  5,000 Units Subcutaneous 3 times per day  . hydrALAZINE  25 mg Oral BID  . insulin aspart  0-15 Units Subcutaneous TID WC  . insulin aspart  0-5 Units Subcutaneous QHS  . mupirocin cream   Topical TID  . sodium chloride  3 mL Intravenous Q12H   Continuous Infusions:  PRN Meds:.sodium chloride, acetaminophen, ipratropium-albuterol, ondansetron (ZOFRAN) IV, sodium  chloride, traZODone  Antibiotics  :     Anti-infectives    None        Objective:   Filed Vitals:   11/22/14 2322 11/22/14 2357 11/23/14 0541 11/23/14 1000  BP:  153/97 126/59 136/75  Pulse:  66 65 65  Temp:  97.7 F (36.5 C) 97.9 F (36.6 C)   TempSrc:  Oral Oral   Resp:  18 21   Height: 5\' 8"  (1.727 m)     Weight: 92.715 kg (204 lb 6.4 oz)  92.7 kg (204 lb 5.9 oz)   SpO2:  100% 97% 99%    Wt Readings from Last 3 Encounters:  11/23/14 92.7 kg (204 lb 5.9 oz)  10/06/14 87.227 kg (192 lb 4.8 oz)  08/23/14 85.276 kg (188 lb)     Intake/Output Summary (Last 24 hours) at 11/23/14 1135 Last data  filed at 11/23/14 1000  Gross per 24 hour  Intake    540 ml  Output   1525 ml  Net   -985 ml     Physical Exam  Awake Alert, Oriented X 3, No new F.N deficits, Normal affect [place].AT,PERRAL Supple Neck,No JVD, No cervical lymphadenopathy appriciated.  Symmetrical Chest wall movement, Good air movement bilaterally, basilar rales RRR,No Gallops,Rubs or new Murmurs, No Parasternal Heave +ve B.Sounds, Abd Soft, No tenderness, No organomegaly appriciated, No rebound - guarding or rigidity. No Cyanosis, Clubbing ,. 1+ edema, No new Rash or bruise       Data Review:   Micro Results No results found for this or any previous visit (from the past 240 hour(s)).  Radiology Reports Dg Chest 2 View  11/22/2014   CLINICAL DATA:  Worsening bilateral lower extremity swelling with shortness of breath for 3 days.  EXAM: CHEST  2 VIEW  COMPARISON:  Chest x-rays dated 06/08/2014 and 12/15/2013.  FINDINGS: Mild cardiomegaly is unchanged. Left chest wall pacemaker/AICD is stable in position. Median sternotomy wires remain intact and normally aligned.  Again noted is mild central pulmonary vascular congestion and bilateral interstitial edema suggesting recurrent volume overload/congestive heart failure. No pleural effusions seen. No pneumothorax. Mild degenerative change again noted within the thoracic spine.  IMPRESSION: Cardiomegaly with central pulmonary vascular congestion and mild interstitial edema suggesting a recurrent/chronic CHF. Appearance is similar to multiple prior studies.   Electronically Signed   By: Franki Cabot M.D.   On: 11/22/2014 16:34     CBC  Recent Labs Lab 11/22/14 1530  WBC 3.0*  HGB 10.4*  HCT 31.8*  PLT 137*  MCV 94.1  MCH 30.8  MCHC 32.7  RDW 14.1    Chemistries   Recent Labs Lab 11/22/14 1530 11/23/14 0530  NA 142 142  K 4.2 4.0  CL 110 112*  CO2 22 24  GLUCOSE 69 110*  BUN 33* 30*  CREATININE 2.45* 2.16*  CALCIUM 9.6 9.6    ------------------------------------------------------------------------------------------------------------------ estimated creatinine clearance is 36.2 mL/min (by C-G formula based on Cr of 2.16). ------------------------------------------------------------------------------------------------------------------ No results for input(s): HGBA1C in the last 72 hours. ------------------------------------------------------------------------------------------------------------------ No results for input(s): CHOL, HDL, LDLCALC, TRIG, CHOLHDL, LDLDIRECT in the last 72 hours. ------------------------------------------------------------------------------------------------------------------  Recent Labs  11/23/14 0015  TSH 1.246   ------------------------------------------------------------------------------------------------------------------ No results for input(s): VITAMINB12, FOLATE, FERRITIN, TIBC, IRON, RETICCTPCT in the last 72 hours.  Coagulation profile No results for input(s): INR, PROTIME in the last 168 hours.  No results for input(s): DDIMER in the last 72 hours.  Cardiac Enzymes  Recent Labs Lab 11/23/14 0015 11/23/14 0530  TROPONINI 0.03 0.03   ------------------------------------------------------------------------------------------------------------------ Invalid input(s): POCBNP   Time Spent in minutes   35   Lala Lund K M.D on 11/23/2014 at 11:35 AM  Between 7am to 7pm - Pager - [phone]  After 7pm go to [email or web address] - password [email or web address]  [facility] -  Office  [phone]
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
## 2014-11-24 ENCOUNTER — Inpatient Hospital Stay (HOSPITAL_COMMUNITY): Payer: Medicare Other

## 2014-11-24 DIAGNOSIS — J9601 Acute respiratory failure with hypoxia: Secondary | ICD-10-CM

## 2014-11-24 DIAGNOSIS — Z992 Dependence on renal dialysis: Secondary | ICD-10-CM

## 2014-11-24 DIAGNOSIS — N189 Chronic kidney disease, unspecified: Secondary | ICD-10-CM

## 2014-11-24 DIAGNOSIS — N186 End stage renal disease: Secondary | ICD-10-CM

## 2014-11-24 DIAGNOSIS — N184 Chronic kidney disease, stage 4 (severe): Secondary | ICD-10-CM

## 2014-11-24 DIAGNOSIS — E1129 Type 2 diabetes mellitus with other diabetic kidney complication: Secondary | ICD-10-CM

## 2014-11-24 DIAGNOSIS — I5043 Acute on chronic combined systolic (congestive) and diastolic (congestive) heart failure: Secondary | ICD-10-CM

## 2014-11-24 DIAGNOSIS — E1122 Type 2 diabetes mellitus with diabetic chronic kidney disease: Secondary | ICD-10-CM

## 2014-11-24 LAB — GLUCOSE, CAPILLARY
GLUCOSE-CAPILLARY: 105 mg/dL — AB (ref 65–99)
GLUCOSE-CAPILLARY: 84 mg/dL (ref 65–99)
GLUCOSE-CAPILLARY: 89 mg/dL (ref 65–99)
Glucose-Capillary: 89 mg/dL (ref 65–99)

## 2014-11-24 LAB — BASIC METABOLIC PANEL
ANION GAP: 7 (ref 5–15)
BUN: 36 mg/dL — ABNORMAL HIGH (ref 6–20)
CHLORIDE: 107 mmol/L (ref 101–111)
CO2: 25 mmol/L (ref 22–32)
Calcium: 9.4 mg/dL (ref 8.9–10.3)
Creatinine, Ser: 2.46 mg/dL — ABNORMAL HIGH (ref 0.61–1.24)
GFR calc non Af Amer: 25 mL/min — ABNORMAL LOW (ref >60)
GFR, EST AFRICAN AMERICAN: 29 mL/min — AB (ref >60)
Glucose, Bld: 98 mg/dL (ref 65–99)
Potassium: 3.8 mmol/L (ref 3.5–5.1)
Sodium: 139 mmol/L (ref 135–145)

## 2014-11-24 LAB — MAGNESIUM: Magnesium: 2.3 mg/dL (ref 1.7–2.4)

## 2014-11-24 MED ORDER — ASPIRIN EC 81 MG PO TBEC
81.0000 mg | DELAYED_RELEASE_TABLET | Freq: Every day | ORAL | Status: DC
  Administered 2014-11-24 – 2014-11-26 (×3): 81 mg via ORAL
  Filled 2014-11-24 (×3): qty 1

## 2014-11-24 MED ORDER — CARVEDILOL 3.125 MG PO TABS
3.1250 mg | ORAL_TABLET | Freq: Two times a day (BID) | ORAL | Status: DC
  Administered 2014-11-24 – 2014-11-26 (×5): 3.125 mg via ORAL
  Filled 2014-11-24 (×5): qty 1

## 2014-11-24 MED ORDER — FUROSEMIDE 10 MG/ML IJ SOLN
10.0000 mg/h | INTRAVENOUS | Status: DC
  Administered 2014-11-24 – 2014-11-25 (×2): 10 mg/h via INTRAVENOUS
  Filled 2014-11-24 (×2): qty 25

## 2014-11-24 MED ORDER — POTASSIUM CHLORIDE CRYS ER 20 MEQ PO TBCR
20.0000 meq | EXTENDED_RELEASE_TABLET | Freq: Two times a day (BID) | ORAL | Status: DC
  Administered 2014-11-24 – 2014-11-26 (×5): 20 meq via ORAL
  Filled 2014-11-24 (×5): qty 1

## 2014-11-24 NOTE — Consult Note (Signed)
   [facility] CM Inpatient Consult   11/24/2014  France Kapner 06-21-1945 [id number] Referral received for post hospital outreach and follow up.  Patient evaluated for community based chronic disease management services with [facility] as a benefit of patient's [employer]. Spoke with patient at bedside to explain [facility] services.  Patient will receive post discharge transition of care call and will be evaluated for monthly home visits for assessments and disease process education.  Left contact information and [facility] literature at bedside and patient signed consent form for services.. Made Inpatient Case Manager aware that [facility] following. Of note, [facility] Care Management services does not replace or interfere with any services that are arranged by inpatient case management or social work.  For additional questions or referrals please contact:   Natividad Brood, RN BSN [facility] Liaison  [phone] business mobile phone

## 2014-11-24 NOTE — Progress Notes (Signed)
PROGRESS NOTE  [name] [id number] DOB: [birth year] DOA: 11/22/2014 PCP: Wenda Low, MD  Brief History 69 year old male with a history of systolic and diastolic CHF, LBBB, CAD status post bypass grafting x3 in 2006 with a LIMA to his LAD, vein to circumflex and PDA, CKD stage III-IV, diabetes mellitus, hypertension, COPD, BiV ICD placed in February 2014 right carotid stenosis status post stent 10/20/2013 presented with shortness of breath 3 days and pulmonary edema on chest x-ray. The patient was admitted in April 2016 with CHF exacerbation with a discharge weight of 172 pounds. Echocardiogram in October 2015 revealed EF 40-45% with akinesis of the basal-inferoseptal myocardium; akinesis of the basal-mid-inferior myocardium. The patient was started on intravenous furosemide with good clinical response. Assessment/Plan: Acute respiratory failure with hypoxia -Secondary to CHF exacerbation -Stable on room air Acute on chronic systolic and diastolic CHF -0000000 echocardiogram EF 99991111, grade 2 diastolic dysfunction, PAP 53 -S/p medtronic ICD 2/14 -remains clinically volume overloaded -continue lasix gtt -06/10/2014 discharge weight 172 pounds -11/22/2014 admission weight 204 pounds -Neg 4 pounds for the admission; Neg 2.8L -appreciate CHF team followup -abdominal US results pending -Continue hydralazine, Imdur, carvedilol -Check HIV, hep B surface antigen, hep C antibody CKD stage IV -Baseline creatinine 2.1-2.5 -Monitor renal function with diuresis CAD s/p CABG 2006  -no CP presently -09/02/2013 Myoview stress test negative for ischemia Diabetes mellitus type 2 with renal complications -d/c amaryl in setting of CKD 4 -novolog sliding scale -Check hemoglobin A1c Right carotid stenosis  -status post stent 10/20/2013 -Continue aspirin and Plavix  Hypertension -Well-controlled -Continue hydralazine, Imdur, carvedilol Hyperlipidemia -Continue  statin History of polysubstance abuse -No longer using cocaine -Urine drug screen negative -40-pack-year history of tobacco--quit 20 years ago -History of daily alcohol use--quit 20 years ago  Family Communication:   Pt at beside Disposition Plan:   Home when medically stable          Procedures/Studies: Dg Chest 2 View  11/22/2014   CLINICAL DATA:  Worsening bilateral lower extremity swelling with shortness of breath for 3 days.  EXAM: CHEST  2 VIEW  COMPARISON:  Chest x-rays dated 06/08/2014 and 12/15/2013.  FINDINGS: Mild cardiomegaly is unchanged. Left chest wall pacemaker/AICD is stable in position. Median sternotomy wires remain intact and normally aligned.  Again noted is mild central pulmonary vascular congestion and bilateral interstitial edema suggesting recurrent volume overload/congestive heart failure. No pleural effusions seen. No pneumothorax. Mild degenerative change again noted within the thoracic spine.  IMPRESSION: Cardiomegaly with central pulmonary vascular congestion and mild interstitial edema suggesting a recurrent/chronic CHF. Appearance is similar to multiple prior studies.   Electronically Signed   By: Franki Cabot M.D.   On: 11/22/2014 16:34         Subjective: Patient denies fevers, chills, headache, chest pain, dyspnea, nausea, vomiting, diarrhea, abdominal pain, dysuria, hematuria   Objective: Filed Vitals:   11/23/14 2035 11/24/14 0453 11/24/14 0934 11/24/14 1205  BP: 117/76 111/81 137/64 118/73  Pulse: 78 75 74 75  Temp: 97.5 F (36.4 C) 98.3 F (36.8 C)  98.4 F (36.9 C)  TempSrc: Oral Oral  Oral  Resp: 18 18 17 20   Height:      Weight:  90.855 kg (200 lb 4.8 oz)    SpO2: 99% 97% 99% 99%    Intake/Output Summary (Last 24 hours) at 11/24/14 1541 Last data filed at 11/24/14 1339  Gross per 24 hour  Intake   1262 ml  Output   3100 ml  Net  -1838 ml   Weight change: -5.761 kg (-12 lb 11.2 oz) Exam:   General:  Pt is alert,  follows commands appropriately, not in acute distress  HEENT: No icterus, No thrush, No neck mass, [place]/AT  Cardiovascular: RRR, S1/S2, no rubs, no gallops  Respiratory: Fine bibasilar crackles. No wheezing.  Abdomen: Soft/+BS, non tender, non distended, no guarding; no hepatosplenomegaly  Extremities: 2+LE edema, No lymphangitis, No petechiae, No rashes, no synovitis; no cyanosis or clubbing  Data Reviewed: Basic Metabolic Panel:  Recent Labs Lab 11/22/14 1530 11/23/14 0530 11/24/14 0314  NA 142 142 139  K 4.2 4.0 3.8  CL 110 112* 107  CO2 22 24 25   GLUCOSE 69 110* 98  BUN 33* 30* 36*  CREATININE 2.45* 2.16* 2.46*  CALCIUM 9.6 9.6 9.4  MG  --   --  2.3   Liver Function Tests: No results for input(s): AST, ALT, ALKPHOS, BILITOT, PROT, ALBUMIN in the last 168 hours. No results for input(s): LIPASE, AMYLASE in the last 168 hours. No results for input(s): AMMONIA in the last 168 hours. CBC:  Recent Labs Lab 11/22/14 1530  WBC 3.0*  HGB 10.4*  HCT 31.8*  MCV 94.1  PLT 137*   Cardiac Enzymes:  Recent Labs Lab 11/23/14 0015 11/23/14 0530 11/23/14 1112  TROPONINI 0.03 0.03 0.03   BNP: Invalid input(s): POCBNP CBG:  Recent Labs Lab 11/23/14 1714 11/23/14 1756 11/23/14 2101 11/24/14 0554 11/24/14 1114  GLUCAP 68 126* 95 89 105*    No results found for this or any previous visit (from the past 240 hour(s)).   Scheduled Meds: . aspirin EC  81 mg Oral Daily  . atorvastatin  40 mg Oral Daily  . carvedilol  3.125 mg Oral BID WC  . clopidogrel  75 mg Oral Daily  . glimepiride  1 mg Oral Q breakfast  . heparin  5,000 Units Subcutaneous 3 times per day  . insulin aspart  0-15 Units Subcutaneous TID WC  . insulin aspart  0-5 Units Subcutaneous QHS  . isosorbide-hydrALAZINE  1 tablet Oral TID  . mupirocin cream   Topical TID  . potassium chloride  20 mEq Oral BID  . sodium chloride  3 mL Intravenous Q12H   Continuous Infusions: . furosemide (LASIX)  infusion 10 mg/hr (11/24/14 0843)     Deaire Mcwhirter, DO  [facility] Pager [phone]  If 7PM-7AM, please contact night-coverage [email or web address] Password [email or web address] 11/24/2014, 3:41 PM   LOS: 2 days

## 2014-11-24 NOTE — Progress Notes (Signed)
Advanced Heart Failure Rounding Note  Primary Physician: Dr. Wenda Low Primary Cardiologist: Dr. Quay Burow HF: [name]  Subjective:    Feels good this morning.  Says leg swelling has gone down a lot with propping up his legs. No SOB this morning, but has not been up and about yet.  Only out 0.5 L but down 4 lbs on 80 mg IV lasix BID.  Will have to watch creatinine very closely. Creatine bumped 2.16 -> 2.46, but within pts normal range.  Objective:   Weight Range: 200 lb 4.8 oz (90.855 kg) Body mass index is 30.46 kg/(m^2).   Vital Signs:   Temp:  [97.5 F (36.4 C)-98.3 F (36.8 C)] 98.3 F (36.8 C) (09/14 0453) Pulse Rate:  [65-78] 75 (09/14 0453) Resp:  [18-20] 18 (09/14 0453) BP: (111-136)/(68-81) 111/81 mmHg (09/14 0453) SpO2:  [97 %-99 %] 97 % (09/14 0453) Weight:  [200 lb 4.8 oz (90.855 kg)] 200 lb 4.8 oz (90.855 kg) (09/14 0453) Last BM Date: 11/23/14  Weight change: Filed Weights   11/22/14 2322 11/23/14 0541 11/24/14 0453  Weight: 204 lb 6.4 oz (92.715 kg) 204 lb 5.9 oz (92.7 kg) 200 lb 4.8 oz (90.855 kg)    Intake/Output:   Intake/Output Summary (Last 24 hours) at 11/24/14 0713 Last data filed at 11/24/14 0600  Gross per 24 hour  Intake   1022 ml  Output   1550 ml  Net   -528 ml     Physical Exam: General: Well appearing. NAD HEENT: normal Neck: supple. JVP 12cm+ . Carotids 2+ bilat; no bruits. No lymphadenopathy or thryomegaly . Cor: PMI nondisplaced. RRR. No rubs, gallops or murmurs appreciated. Lungs: CTA, slightly diminished bases Abdomen: Obese, NT, tight, + distended. No HSM noted. No bruits or masses. +BS Extremities: no cyanosis, clubbing, rash. Trace ankle edema. Neuro: alert & orientedx3, cranial nerves grossly intact. moves all 4 extremities w/o difficulty. Affect very pleasant   Telemetry: paced 70s, Occasional PVC  Labs: CBC  Recent Labs  11/22/14 1530  WBC 3.0*  HGB 10.4*  HCT 31.8*  MCV 94.1  PLT 137*   Basic  Metabolic Panel  Recent Labs  11/23/14 0530 11/24/14 0314  NA 142 139  K 4.0 3.8  CL 112* 107  CO2 24 25  GLUCOSE 110* 98  BUN 30* 36*  CALCIUM 9.6 9.4  MG  --  2.3   Liver Function Tests No results for input(s): AST, ALT, ALKPHOS, BILITOT, PROT, ALBUMIN in the last 72 hours. No results for input(s): LIPASE, AMYLASE in the last 72 hours. Cardiac Enzymes  Recent Labs  11/23/14 0015 11/23/14 0530 11/23/14 1112  TROPONINI 0.03 0.03 0.03    BNP: BNP (last 3 results)  Recent Labs  06/08/14 1444 11/22/14 1530  BNP 558.3* 583.5*    ProBNP (last 3 results)  Recent Labs  12/15/13 1111  PROBNP 8210.0*     D-Dimer No results for input(s): DDIMER in the last 72 hours. Hemoglobin A1C No results for input(s): HGBA1C in the last 72 hours. Fasting Lipid Panel No results for input(s): CHOL, HDL, LDLCALC, TRIG, CHOLHDL, LDLDIRECT in the last 72 hours. Thyroid Function Tests  Recent Labs  11/23/14 0015  TSH 1.246    Other results:     Imaging/Studies:  Dg Chest 2 View  11/22/2014   CLINICAL DATA:  Worsening bilateral lower extremity swelling with shortness of breath for 3 days.  EXAM: CHEST  2 VIEW  COMPARISON:  Chest x-rays dated 06/08/2014 and 12/15/2013.  FINDINGS: Mild cardiomegaly is unchanged. Left chest wall pacemaker/AICD is stable in position. Median sternotomy wires remain intact and normally aligned.  Again noted is mild central pulmonary vascular congestion and bilateral interstitial edema suggesting recurrent volume overload/congestive heart failure. No pleural effusions seen. No pneumothorax. Mild degenerative change again noted within the thoracic spine.  IMPRESSION: Cardiomegaly with central pulmonary vascular congestion and mild interstitial edema suggesting a recurrent/chronic CHF. Appearance is similar to multiple prior studies.   Electronically Signed   By: Franki Cabot M.D.   On: 11/22/2014 16:34     Latest Echo  Latest  Cath   Medications:     Scheduled Medications: . amLODipine  10 mg Oral Daily  . aspirin EC  325 mg Oral Daily  . atorvastatin  40 mg Oral Daily  . clopidogrel  75 mg Oral Daily  . furosemide  80 mg Intravenous Q12H  . glimepiride  1 mg Oral Q breakfast  . heparin  5,000 Units Subcutaneous 3 times per day  . insulin aspart  0-15 Units Subcutaneous TID WC  . insulin aspart  0-5 Units Subcutaneous QHS  . isosorbide-hydrALAZINE  1 tablet Oral TID  . mupirocin cream   Topical TID  . sodium chloride  3 mL Intravenous Q12H     Infusions:     PRN Medications:  sodium chloride, acetaminophen, ipratropium-albuterol, ondansetron (ZOFRAN) IV, sodium chloride, traZODone   Assessment/Plan   [name] is a 69 y.o. with Chronic combined CHF 30-35% s/p Medtronic ICD placement 2/14, CAD s/p CABG x 3, COPD, DM, HTN, LBB, Carotid artery disease s/p stent 8/15 on DAPT, and CKD stage 3-4 who presented with worsening DOE and edema.  1. Acute on chronic combined CHF Echo 9/13 LVEF 30-35%, Grade 2 DD, PA peak pressure 53, RV mildly reduced. S/p medtronic ICD 2/14 - Remains volume overloaded.  Change lasix to gtt at 10 mg/hr. Hope it will be more gentle for his kidneys.  Baseline weight unclear, but pt thinks 170-180 lbs. - Ischemic CMP with hx of MI - EF decreased from Echo 10/15 LVEF 40-45%, mildly dilated RV - ischemic - Myoview 6/15 with no ischemia. May benefit from repeat vs LHC with worsening EF. - Continue BB for now. - Continue BIDIL - No ACE/ARB with CKD - Continue fluid/salt restriction, Strict I/Os, daily weights (standing). - Abdomen pretty tight, may benefit from ABD [place] +/- paracentesis. 2. CAD s/p CABG 2006 with a LIMA to LAD, vein to the circumflex and PDA. - Myoview stress test performed 09/02/13 showed no ischemia. - Not having CP. 3. CKD stage 4 - Cr 2.16 -> 2.46.  - Will monitor closely with diuresis, may be a limiting factor 4. Hyperlipidemia - Continue statin 5.  Hypertension - On amlodapine and coreg - Bidil started 9/13 at 1 tab TID. Will up titrate as able. 6. Carotid artery disease - Stent placed 8/15. On ASA and plavix - [place] 3/16 showed widely patent 7. DM - Per primary  Length of Stay: 2  Shirley Friar PA-C 11/24/2014, 7:13 AM  Advanced Heart Failure Team Pager [phone] (M-F; 7a - 4p)  Please contact [facility] for night-coverage after hours (4p -7a ) and weekends on [email or web address]  Patient seen with PA, agree with the above note.    Patient seen with PA, agree with the above note.  1. Acute on chronic systolic CHF: Ischemic cardiomyopathy.Echo this admission with EF 30-35%, septal-lateral dyssynchrony. He is volume overloaded on exam still but weight down.  I/Os  not particularly negative yesterday, however.  I think he still has volume to come off but may be limited by renal function.  Creatinine 2.4 today but had been around this level prior to yesterday (when it dropped to 2.1).  - I think it would be reasonable to use Lasix gtt today 10 mg/hr.    - Continue Bidil 1 tab tid and Coreg 3.125 mg bid.  - No ACEI for now with elevated creatinine.  - ECG with IVCD 168 msec, will have EP comment on whether or not he may benefit from CRT upgrade.  2. CAD: s/p CABG. No chest pain. Cardiolite last year was nonischemic. Continue ASA, Plavix, statin.  3. CKD stage IV: Creatinine 2.4, was at this level prior to yesterday. Will have to follow closely, would like to try to get 1 more day at least of good IV diuresis but may be limited by kidneys.  4. HTN: BP lower.  With rise in creatinine will stop amlodipine.    Loralie Champagne 11/24/2014 8:38 AM

## 2014-11-24 NOTE — Progress Notes (Signed)
Heart Failure Navigator Consult Note  Presentation: Evangelos Du is a 69 y.o. male with PMHX of HTN, DM, CHF. He has remote h/o drug abuse that he currently denies. He was hospitalized in APril with CHF exacerbation and was d/c'd with a weight of 170. He presents to the ER today with SOB. It has been worsening for 3 days, worse with activity. He is + for orthopnea. He has been taking his meds but has not been weighing himself daily Last echo was 12/2013 and showed: estimated ejection fraction was in the range of 40% to 45%. There is akinesis of the basal-inferoseptal myocardium. There is akinesis of the basal-mid-inferior myocardium. There is hypokinesis of the apical myocardium.  No CP, no fever, no chills  In the ER, he was found to have and elevated BNP, pulm edema on x ray and swelling in LE b/l. He was 93% on RA. He was given 40 mg IV Lasix x 1 and hospitalist were asked to admit for further management.   Past Medical History  Diagnosis Date  . Hypertension     2D ECHO, 04/01/2012 - EF 123XX123, systolic function severely reduced, moderate hypokinesis of the inferolateral, inferior, and inferoseptal myocardium  . Diabetes mellitus   . Chronic combined systolic and diastolic CHF, NYHA class 2     NUC, 11/25/2009 - No evidence for a reversible defect or ischemia, inferior wall infarct with hypokinesia along the inferior wall, EF-34%  . S/P CABG x 3   . Bronchitis   . Chest pain at rest 04/01/2012  . Acute respiratory failure, requiring BiPap, now with diuresing improved. 04/01/2012  . At risk for sudden cardiac death 05/16/12  . Cardiomyopathy, ischemic 2012/05/16  . Myocardial infarction   . Coronary artery disease   . Dysrhythmia     LBBB  . Chronic kidney disease     STAGE 3   . Carotid artery disease     status post right internal carotid artery stenting 10/20/13  . Automatic implantable cardioverter-defibrillator in situ   . Arthritis   . Hyperlipidemia   . Shortness of  breath     Social History   Social History  . Marital Status: Divorced    Spouse Name: N/A  . Number of Children: N/A  . Years of Education: N/A   Social History Main Topics  . Smoking status: Former Smoker    Quit date: 1982/05/16  . Smokeless tobacco: Never Used  . Alcohol Use: No  . Drug Use: 7.00 per week    Special: Marijuana  . Sexual Activity: Not Asked   Other Topics Concern  . None   Social History Narrative    ECHO:Study Conclusions--11/23/14  - Left ventricle: The cavity size was normal. Wall thickness was normal. Systolic function was moderately to severely reduced. The estimated ejection fraction was in the range of 30% to 35%. Basal to mid inferior and inferolateral severe hypokinesis. Basal anterolateral severe hypokinesis. The remainder of the LV was mild to moderately hypokinetic. Septal-lateral dyssynchrony. Features are consistent with a pseudonormal left ventricular filling pattern, with concomitant abnormal relaxation and increased filling pressure (grade 2 diastolic dysfunction). E/medial e&' > 15, suggesting LV end diastolic pressure at least 20 mmHg. - Aortic valve: There was no stenosis. There was mild regurgitation. - Mitral valve: Mildly to moderately calcified annulus. There was mild regurgitation. - Left atrium: The atrium was mildly dilated. - Right ventricle: Pacer wire or catheter noted in right ventricle. Systolic function was mildly reduced. - Right atrium:  The atrium was mildly dilated. - Tricuspid valve: Peak RV-RA gradient (S): 45 mm Hg. - Pulmonary arteries: PA peak pressure: 53 mm Hg (S). - Systemic veins: IVC measured 2.3 cm with > 50% respirophasic variation, suggesting RA pressure 8 mmHg.  Impressions:  - Normal LV size with EF 30-35%. Wall motion abnormalities as noted above. Septal-lateral dyssynchrony. Moderate diastolic dysfunction with evidence for elevated LV filling  pressure. Normal RV size with mildly decreased systolic function. Mild MR. Moderate pulmonary hypertension.  BNP    Component Value Date/Time   BNP 583.5* 11/22/2014 1530    ProBNP    Component Value Date/Time   PROBNP 8210.0* 12/15/2013 1111     Education Assessment and Provision:  Detailed education and instructions provided on heart failure disease management including the following:  Signs and symptoms of Heart Failure When to call the physician Importance of daily weights Low sodium diet Fluid restriction Medication management Anticipated future follow-up appointments  Patient education given on each of the above topics.  Patient acknowledges understanding and acceptance of all instructions.  I spoke briefly with Mr. Raska regarding his HF.  He says that he has a scale and admits that he has not been weighing daily.  I reviewed the importance of daily weights and when to call the physician.  He also admits that he has allowed himself to "run out of meds" in the past--he did not give an exact reason why.  I reviewed the importance of taking all medications as prescribed.  We discussed briefly a low sodium diet and high sodium foods to avoid.  He tells me that he does not "use salt".  He lives in [place] in an apt.  He does not drive and reports that he would take the bus to appts as needed.  He will follow with the AHF clinic after discharge.  Education Materials:  "Living Better With Heart Failure" Booklet, Daily Weight Tracker Tool and Heart Failure Educational Video.   High Risk Criteria for Readmission and/or Poor Patient Outcomes:  (Recommend Follow-up with Advanced Heart Failure Clinic)--Yes   EF <30%- 30-35% with grade 2 dias dys  2 or more admissions in 6 months- yes  Difficult social situation- Yes--lives alone with limited support  Demonstrates medication noncompliance- yes has been without medications in the past    Barriers of Care:   Knowledge and compliance  Discharge Planning:   Plans to discharge to home alone.  He could benefit from [facility] for ongoing education, symptom recognition and compliance reinforcement.

## 2014-11-24 NOTE — Patient Outreach (Signed)
[facility]) Care Management  11/24/2014  Kareen Gair [birth year] [id number]   Referral from Natividad Brood, RN to assign [employer], assigned [name], RN.  Thanks, Ronnell Freshwater. [place], [place] Assistant Phone: [phone] Fax: [phone]

## 2014-11-25 DIAGNOSIS — I5043 Acute on chronic combined systolic (congestive) and diastolic (congestive) heart failure: Principal | ICD-10-CM

## 2014-11-25 DIAGNOSIS — E0822 Diabetes mellitus due to underlying condition with diabetic chronic kidney disease: Secondary | ICD-10-CM | POA: Insufficient documentation

## 2014-11-25 LAB — BASIC METABOLIC PANEL
Anion gap: 9 (ref 5–15)
BUN: 39 mg/dL — AB (ref 6–20)
CHLORIDE: 106 mmol/L (ref 101–111)
CO2: 25 mmol/L (ref 22–32)
CREATININE: 2.53 mg/dL — AB (ref 0.61–1.24)
Calcium: 9.9 mg/dL (ref 8.9–10.3)
GFR calc Af Amer: 28 mL/min — ABNORMAL LOW (ref >60)
GFR calc non Af Amer: 25 mL/min — ABNORMAL LOW (ref >60)
Glucose, Bld: 96 mg/dL (ref 65–99)
Potassium: 3.7 mmol/L (ref 3.5–5.1)
Sodium: 140 mmol/L (ref 135–145)

## 2014-11-25 LAB — GLUCOSE, CAPILLARY
GLUCOSE-CAPILLARY: 94 mg/dL (ref 65–99)
GLUCOSE-CAPILLARY: 115 mg/dL — AB (ref 65–99)
GLUCOSE-CAPILLARY: 139 mg/dL — AB (ref 65–99)
GLUCOSE-CAPILLARY: 143 mg/dL — AB (ref 65–99)
Glucose-Capillary: 71 mg/dL (ref 65–99)

## 2014-11-25 LAB — HIV ANTIBODY (ROUTINE TESTING W REFLEX): HIV Screen 4th Generation wRfx: NONREACTIVE

## 2014-11-25 MED ORDER — TORSEMIDE 20 MG PO TABS
60.0000 mg | ORAL_TABLET | Freq: Every day | ORAL | Status: DC
  Administered 2014-11-25: 60 mg via ORAL
  Filled 2014-11-25: qty 3

## 2014-11-25 NOTE — Care Management Important Message (Signed)
Important Message  Patient Details  Name: [name] MRN: [id number] Date of Birth: [birth year]   Medicare Important Message Given:  Yes-second notification given    [name] 11/25/2014, 8:40 AM

## 2014-11-25 NOTE — Progress Notes (Signed)
Pt had CBG 71, no s/s gave OJ and graham crackers and PB, rechecked CBG 143, will continue to monitor, thanks Arvella Nigh RN

## 2014-11-25 NOTE — Progress Notes (Signed)
PROGRESS NOTE  [name] [id number] DOB: 08-Mar-1946 DOA: 11/22/2014 PCP: Wenda Low, MD  Brief History 69 year old male with a history of systolic and diastolic CHF, LBBB, CAD status post bypass grafting x3 in 2006 with a LIMA to his LAD, vein to circumflex and PDA, CKD stage III-IV, diabetes mellitus, hypertension, COPD, BiV ICD placed in February 2014 right carotid stenosis status post stent 10/20/2013 presented with shortness of breath 3 days and pulmonary edema on chest x-ray. The patient was admitted in April 2016 with CHF exacerbation with a discharge weight of 172 pounds. Echocardiogram in October 2015 revealed EF 40-45% with akinesis of the basal-inferoseptal myocardium; akinesis of the basal-mid-inferior myocardium. The patient was started on intravenous furosemide with good clinical response. The patient was on a furosemide drip for 24 hours. He was subsequently transitioned to oral torsemide. Assessment/Plan: Acute respiratory failure with hypoxia -Secondary to CHF exacerbation -Stable on room air Acute on chronic systolic and diastolic CHF -0000000 echocardiogram EF [id number], grade 2 diastolic dysfunction, PAP 53 -S/p medtronic ICD 2/14 -lasix gtt-->torsemide on 9/15 -06/10/2014 discharge weight 172 pounds -11/22/2014 admission weight 204 pounds -Neg 9 pounds for the admission; Neg 4.3L -appreciate CHF team followup -abdominal US--no ascites -Continue BiDil,  carvedilol -Check HIV--neg -hep B surface antigen, hep C antibody--pending CKD stage IV -Baseline creatinine 2.1-2.5 -Monitor renal function with diuresis CAD s/p CABG 2006  -no CP presently -09/02/2013 Myoview stress test negative for ischemia Diabetes mellitus type 2 with renal complications -d/c amaryl in setting of CKD 4 -novolog sliding scale -Check hemoglobin A1c--pending Right carotid stenosis  -status post stent 10/20/2013 -Continue aspirin and Plavix   Hypertension -Well-controlled -Continue hydralazine, Imdur, carvedilol Hyperlipidemia -Continue statin History of polysubstance abuse -No longer using cocaine -Urine drug screen negative -40-pack-year history of tobacco--quit 20 years ago -History of daily alcohol use--quit 20 years ago  Family Communication: Pt at beside Disposition Plan: Home when medically stable      Procedures/Studies: Dg Chest 2 View  11/22/2014   CLINICAL DATA:  Worsening bilateral lower extremity swelling with shortness of breath for 3 days.  EXAM: CHEST  2 VIEW  COMPARISON:  Chest x-rays dated 06/08/2014 and 12/15/2013.  FINDINGS: Mild cardiomegaly is unchanged. Left chest wall pacemaker/AICD is stable in position. Median sternotomy wires remain intact and normally aligned.  Again noted is mild central pulmonary vascular congestion and bilateral interstitial edema suggesting recurrent volume overload/congestive heart failure. No pleural effusions seen. No pneumothorax. Mild degenerative change again noted within the thoracic spine.  IMPRESSION: Cardiomegaly with central pulmonary vascular congestion and mild interstitial edema suggesting a recurrent/chronic CHF. Appearance is similar to multiple prior studies.   Electronically Signed   By: Franki Cabot M.D.   On: 11/22/2014 16:34   US Abdomen Limited  11/24/2014   CLINICAL DATA:  Distended abdomen.  Assess for ascites.  EXAM: LIMITED ABDOMEN ULTRASOUND FOR ASCITES  TECHNIQUE: Limited ultrasound survey for ascites was performed in all four abdominal quadrants.  COMPARISON:  None.  FINDINGS: No ascites is identified.  IMPRESSION: No significant ascites identified in the abdomen.   Electronically Signed   By: Abelardo Diesel M.D.   On: 11/24/2014 17:45        Subjective: Patient denies fevers, chills, headache, chest pain, dyspnea, nausea, vomiting, diarrhea, abdominal pain, dysuria, hematuria   Objective: Filed Vitals:   11/24/14 1938 11/25/14 0028  11/25/14 0539 11/25/14 1207  BP: 127/70 104/64 119/53 108/58  Pulse: 76 78 74 83  Temp: 98.2 F (36.8 C) 98.6 F (37 C) 98.5 F (36.9 C) 98 F (36.7 C)  TempSrc: Oral Oral Oral Oral  Resp: 18 18 16 18   Height:      Weight:   88.633 kg (195 lb 6.4 oz)   SpO2: 100% 96% 97% 99%    Intake/Output Summary (Last 24 hours) at 11/25/14 1713 Last data filed at 11/25/14 1300  Gross per 24 hour  Intake   1200 ml  Output   1725 ml  Net   -525 ml   Weight change: -2.223 kg (-4 lb 14.4 oz) Exam:   General:  Pt is alert, follows commands appropriately, not in acute distress  HEENT: No icterus, No thrush, No neck mass, [place]/AT  Cardiovascular: RRR, S1/S2, no rubs, no gallops  Respiratory: CTA bilaterally, no wheezing, no crackles, no rhonchi  Abdomen: Soft/+BS, non tender, non distended, no guarding  Extremities: trace LE edema, No lymphangitis, No petechiae, No rashes, no synovitis  Data Reviewed: Basic Metabolic Panel:  Recent Labs Lab 11/22/14 1530 11/23/14 0530 11/24/14 0314 11/25/14 0355  NA 142 142 139 140  K 4.2 4.0 3.8 3.7  CL 110 112* 107 106  CO2 22 24 25 25   GLUCOSE 69 110* 98 96  BUN 33* 30* 36* 39*  CREATININE 2.45* 2.16* 2.46* 2.53*  CALCIUM 9.6 9.6 9.4 9.9  MG  --   --  2.3  --    Liver Function Tests: No results for input(s): AST, ALT, ALKPHOS, BILITOT, PROT, ALBUMIN in the last 168 hours. No results for input(s): LIPASE, AMYLASE in the last 168 hours. No results for input(s): AMMONIA in the last 168 hours. CBC:  Recent Labs Lab 11/22/14 1530  WBC 3.0*  HGB 10.4*  HCT 31.8*  MCV 94.1  PLT 137*   Cardiac Enzymes:  Recent Labs Lab 11/23/14 0015 11/23/14 0530 11/23/14 1112  TROPONINI 0.03 0.03 0.03   BNP: Invalid input(s): POCBNP CBG:  Recent Labs Lab 11/24/14 1635 11/24/14 2103 11/25/14 0544 11/25/14 1034 11/25/14 1553  GLUCAP 84 89 94 115* 139*    No results found for this or any previous visit (from the past 240 hour(s)).    Scheduled Meds: . aspirin EC  81 mg Oral Daily  . atorvastatin  40 mg Oral Daily  . carvedilol  3.125 mg Oral BID WC  . clopidogrel  75 mg Oral Daily  . heparin  5,000 Units Subcutaneous 3 times per day  . insulin aspart  0-15 Units Subcutaneous TID WC  . insulin aspart  0-5 Units Subcutaneous QHS  . isosorbide-hydrALAZINE  1 tablet Oral TID  . mupirocin cream   Topical TID  . potassium chloride  20 mEq Oral BID  . sodium chloride  3 mL Intravenous Q12H  . torsemide  60 mg Oral Daily   Continuous Infusions:    Vaniah Chambers, DO  [facility] Pager [phone]  If 7PM-7AM, please contact night-coverage [email or web address] Password [email or web address] 11/25/2014, 5:13 PM   LOS: 3 days

## 2014-11-25 NOTE — Evaluation (Signed)
Physical Therapy Evaluation and D/C Patient Details Name: [name] MRN: [id number] DOB: [birth year] Today's Date: 11/25/2014   History of Present Illness  Alec Karow is a 69 y.o. with Chronic combined CHF 30-35% s/p Medtronic ICD placement 2/14, CAD s/p CABG x 3, COPD, DM, HTN, LBB, Carotid artery disease s/p stent 8/15 on DAPT, and CKD stage 3-4 who presented with worsening DOE and edema.  Clinical Impression  Pt admitted with above diagnosis. Pt currently without significant  functional limitations and is ambulating in halls with cane with Modif I.  No LOb with min challenges.  Pt appears at baseline.  Will sign off as pt has no PT needs.    Follow Up Recommendations No PT follow up    Equipment Recommendations  None recommended by PT    Recommendations for Other Services       Precautions / Restrictions Precautions Precautions: None Restrictions Weight Bearing Restrictions: No      Mobility  Bed Mobility Overal bed mobility: Modified Independent                Transfers Overall transfer level: Modified independent Equipment used: Straight cane                Ambulation/Gait Ambulation/Gait assistance: Modified independent (Device/Increase time) Ambulation Distance (Feet): 200 Feet Assistive device: Straight cane Gait Pattern/deviations: Decreased stride length;Step-through pattern;Decreased step length - right;Decreased step length - left;Wide base of support;Trunk flexed Gait velocity: reduced Gait velocity interpretation: Below normal speed for age/gender General Gait Details: No signs of SOB during gait, used cart as a simple support with more a Mudlogger than wbing support  Stairs            Wheelchair Mobility    Modified Rankin (Stroke Patients Only)       Balance Overall balance assessment: Modified Independent                                           Pertinent Vitals/Pain Pain Assessment: No/denies pain   VSS    Home Living Family/patient expects to be discharged to:: Private residence Living Arrangements: Alone   Type of Home: Apartment Home Access: Level entry     Home Layout: One level Home Equipment: Cane - single point      Prior Function Level of Independence: Independent with assistive device(s)         Comments: Using public transportation for errands and appointments     Hand Dominance        Extremity/Trunk Assessment   Upper Extremity Assessment: Defer to OT evaluation           Lower Extremity Assessment: Generalized weakness      Cervical / Trunk Assessment: Kyphotic  Communication   Communication: No difficulties  Cognition Arousal/Alertness: Awake/alert Behavior During Therapy: WFL for tasks assessed/performed Overall Cognitive Status: Within Functional Limits for tasks assessed                      General Comments      Exercises General Exercises - Lower Extremity Long Arc Quad: Strengthening;Both;10 reps Hip ABduction/ADduction: Strengthening;Both;10 reps      Assessment/Plan    PT Assessment Patent does not need any further PT services  PT Diagnosis Generalized weakness   PT Problem List    PT Treatment Interventions     PT Goals (Current goals can be  found in the Care Plan section)      Frequency     Barriers to discharge        Co-evaluation               End of Session Equipment Utilized During Treatment: Gait belt Activity Tolerance: Patient tolerated treatment well Patient left: in chair;with call bell/phone within reach Nurse Communication: Mobility status         Time: 1208-1219 PT Time Calculation (min) (ACUTE ONLY): 11 min   Charges:   PT Evaluation $Initial PT Evaluation Tier I: 1 Procedure     PT G CodesIrwin Brakeman F 2014/12/05, 2:09 PM Mackenzie Groom [facility] Acute Rehabilitation [phone] (pager)

## 2014-11-25 NOTE — Progress Notes (Signed)
Advanced Heart Failure Rounding Note  Primary Physician: Dr. Wenda Low Primary Cardiologist: Dr. Quay Burow HF: Delila Kuklinski  Subjective:    Feels ok today.  Has not been walking around so isn't sure how is DOE is.  Says he will try some today.  Still peeing a lot and feels swelling is coming down. No CP, dizziness, lightheadedness.  Out 2.5 L and down 5 lbs on Lasix gtt at 10 mg/hr.  Creatine up slightly from yesterday 2.46 -> 2.53  Objective:   Weight Range: 195 lb 6.4 oz (88.633 kg) Body mass index is 29.72 kg/(m^2).   Vital Signs:   Temp:  [98.2 F (36.8 C)-98.6 F (37 C)] 98.5 F (36.9 C) (09/15 0539) Pulse Rate:  [74-78] 74 (09/15 0539) Resp:  [16-20] 16 (09/15 0539) BP: (104-137)/(53-73) 119/53 mmHg (09/15 0539) SpO2:  [96 %-100 %] 97 % (09/15 0539) Weight:  [195 lb 6.4 oz (88.633 kg)] 195 lb 6.4 oz (88.633 kg) (09/15 0539) Last BM Date: 11/24/14  Weight change: Filed Weights   11/23/14 0541 11/24/14 0453 11/25/14 0539  Weight: 204 lb 5.9 oz (92.7 kg) 200 lb 4.8 oz (90.855 kg) 195 lb 6.4 oz (88.633 kg)    Intake/Output:   Intake/Output Summary (Last 24 hours) at 11/25/14 0704 Last data filed at 11/25/14 0616  Gross per 24 hour  Intake 1172.83 ml  Output   3725 ml  Net -2552.17 ml     Physical Exam: General: Well appearing. NAD HEENT: normal Neck: supple. JVP 7-8. Carotids 2+ bilat; no bruits. No lymphadenopathy or thryomegaly . Cor: PMI nondisplaced. RRR. No rubs, gallops or murmurs. Lungs: CTA Abdomen: Obese, NT, tight, + distended. No HSM noted. No bruits or masses. Good bowel sounds. Extremities: no cyanosis, clubbing, rash. Trace ankle edema. Neuro: alert & orientedx3, cranial nerves grossly intact. moves all 4 extremities w/o difficulty. Affect very pleasant   Telemetry: paced 70s, Scant PVCs  Labs: CBC  Recent Labs  11/22/14 1530  WBC 3.0*  HGB 10.4*  HCT 31.8*  MCV 94.1  PLT 0000000*   Basic Metabolic Panel  Recent Labs   11/24/14 0314 11/25/14 0355  NA 139 140  K 3.8 3.7  CL 107 106  CO2 25 25  GLUCOSE 98 96  BUN 36* 39*  CALCIUM 9.4 9.9  MG 2.3  --    Liver Function Tests No results for input(s): AST, ALT, ALKPHOS, BILITOT, PROT, ALBUMIN in the last 72 hours. No results for input(s): LIPASE, AMYLASE in the last 72 hours. Cardiac Enzymes  Recent Labs  11/23/14 0015 11/23/14 0530 11/23/14 1112  TROPONINI 0.03 0.03 0.03    BNP: BNP (last 3 results)  Recent Labs  06/08/14 1444 11/22/14 1530  BNP 558.3* 583.5*    ProBNP (last 3 results)  Recent Labs  12/15/13 1111  PROBNP 8210.0*     D-Dimer No results for input(s): DDIMER in the last 72 hours. Hemoglobin A1C No results for input(s): HGBA1C in the last 72 hours. Fasting Lipid Panel No results for input(s): CHOL, HDL, LDLCALC, TRIG, CHOLHDL, LDLDIRECT in the last 72 hours. Thyroid Function Tests  Recent Labs  11/23/14 0015  TSH 1.246    Other results:     Imaging/Studies:  US Abdomen Limited  11/24/2014   CLINICAL DATA:  Distended abdomen.  Assess for ascites.  EXAM: LIMITED ABDOMEN ULTRASOUND FOR ASCITES  TECHNIQUE: Limited ultrasound survey for ascites was performed in all four abdominal quadrants.  COMPARISON:  None.  FINDINGS: No ascites is identified.  IMPRESSION: No significant ascites identified in the abdomen.   Electronically Signed   By: Abelardo Diesel M.D.   On: 11/24/2014 17:45    Latest Echo  Latest Cath   Medications:     Scheduled Medications: . aspirin EC  81 mg Oral Daily  . atorvastatin  40 mg Oral Daily  . carvedilol  3.125 mg Oral BID WC  . clopidogrel  75 mg Oral Daily  . heparin  5,000 Units Subcutaneous 3 times per day  . insulin aspart  0-15 Units Subcutaneous TID WC  . insulin aspart  0-5 Units Subcutaneous QHS  . isosorbide-hydrALAZINE  1 tablet Oral TID  . mupirocin cream   Topical TID  . potassium chloride  20 mEq Oral BID  . sodium chloride  3 mL Intravenous Q12H     Infusions: . furosemide (LASIX) infusion 10 mg/hr (11/25/14 0615)    PRN Medications: sodium chloride, acetaminophen, ipratropium-albuterol, ondansetron (ZOFRAN) IV, sodium chloride, traZODone   Assessment/Plan   [name] is a 69 y.o. with Chronic combined CHF 30-35% s/p Medtronic ICD placement 2/14, CAD s/p CABG x 3, COPD, DM, HTN, LBB, Carotid artery disease s/p stent 8/15 on DAPT, and CKD stage 3-4 who presented with worsening DOE and edema.  1. Acute on chronic combined CHF Echo 9/13 LVEF 30-35%, Grade 2 DD, PA peak pressure 53, RV mildly reduced, Ischemic Cardiomyopathy. S/p medtronic ICD 2/14 (EF decreased from previous 40-45%) NYHA II-III - Volume looks better.  Will discuss with MD optimal timing of transition to po for best preservation of kidney function.  - Baseline weight unclear. Think some of his weight is new adipose from decreased activity - Will watch K close, will hold off on increasing supp this am with worsening Cr. - Continue BB and BIDIL. No ACE/ARB with CKD - Continue fluid/salt restriction, Strict I/Os, daily weights (standing). - [place] Abd without significant ascites - PT to eval and treat to assess mobility and functional status. 2. CAD s/p CABG 2006 with a LIMA to LAD, vein to the circumflex and PDA. - Myoview stress test performed 09/02/13 showed no ischemia. - No CP. Continue ASA, Plavix, Statin 3. CKD stage 4 - Cr 2.16 -> 2.46 -> 2.53.  4. Hyperlipidemia - Continue statin 5. Hypertension - On coreg 3.125 BID and BiDil 1 tab TID - Holding amlodipine soft BP and elevated creatinine. 6. Carotid artery disease - Stent placed 8/15. On ASA and plavix - [place] 3/16 showed widely patent 7. DM - Per primary  Length of Stay: 3  Shirley Friar PA-C 11/25/2014, 7:04 AM  Advanced Heart Failure Team Pager [phone] (M-F; 7a - 4p)  Please contact [facility] for night-coverage after hours (4p -7a ) and weekends on [email or web address]  Patient seen with  PA, agree with the above note.  1. Acute on chronic systolic CHF: Ischemic cardiomyopathy.Echo this admission with EF 30-35%, septal-lateral dyssynchrony. He diuresed well yesterday and is down around 10 lbs total.  Creatinine mildly higher.  On exam, I do not think that he looks markedly volume overloaded at this point.  - Stop Lasix gtt, start torsemide 60 mg daily for home use.  - Continue Bidil 1 tab tid and Coreg 3.125 mg bid. Will have care coordinator help with getting him Bidil.  - No ACEI for now with elevated creatinine.  - ECG with IVCD 168 msec, will send him to EP after discharge to comment on whether or not he may benefit from CRT upgrade.  2. CAD:  s/p CABG. No chest pain. Cardiolite last year was nonischemic. Continue ASA, Plavix, statin.  3. CKD stage IV: Creatinine 2.53, this is within the range in which he usually fluctuates.  Transition to po diuretics today. 4. HTN: BP lower, now off amlodipine.   Loralie Champagne 11/25/2014 8:30 AM

## 2014-11-25 NOTE — Progress Notes (Signed)
Patient could benefit from a Disease Management Program for CHF; [place] choice offered, patient chose [facility]. Mary with [facility] called for arrangements. Attending MD at discharge please order HHRN/ PT Disease Management Program for CHF. Mindi Slicker [facility] [phone]

## 2014-11-25 NOTE — Care Management Note (Signed)
Case Management Note  Patient Details  Name: [name] MRN: [id number] Date of Birth: February 01, 1946  Subjective/Objective:     Admitted with CHF               Action/Plan: Patient lives alone, use the Bus for transportation. Continues to active as much as possible, goes grocery shopping, cooks and cleans his home. He has scales at home but does not weigh himself. CM informed the patient the importance of weighing himself daily. Patient stated that he eats a lot of canned foods - he pours off the liquid and adds water to the food. CM counseled patient on the importance of reading the labels of canned food to monitor the sodium contents in the food. He has insurance with Medicare and a supplement for part D. Coupon for Bidil  given to patient with explanation of usage / Bidil saving card given also. Pharmacy of choice is [facility]. He use a cane at home and has family members that live close by but does not call them for assistance. Stated "I don't want to bother anybody, they have their own life.'   Expected Discharge Date:    possibly 11/26/2014 Expected Discharge Plan:  Home/Self Care  Discharge planning Services  CM Consult  Status of Service:  In process, will continue to follow  Medicare Important Message Given:  [facility] notification given  Sherrilyn Rist [phone] 11/25/2014, 11:54 AM

## 2014-11-25 NOTE — Progress Notes (Signed)
Pharmacist Heart Failure Core Measure Documentation  Assessment: [name] has an EF documented as 30-35% on 11/23/2014   Rationale: Heart failure patients with left ventricular systolic dysfunction (LVSD) and an EF < 40% should be prescribed an angiotensin converting enzyme inhibitor (ACEI) or angiotensin receptor blocker (ARB) at discharge unless a contraindication is documented in the medical record.  This patient is not currently on an ACEI or ARB for HF.  This note is being placed in the record in order to provide documentation that a contraindication to the use of these agents is present for this encounter.  ACE Inhibitor or Angiotensin Receptor Blocker is contraindicated (specify all that apply)  []   ACEI allergy AND ARB allergy []   Angioedema []   Moderate or severe aortic stenosis []   Hyperkalemia []   Hypotension []   Renal artery stenosis [x]   Worsening renal function, preexisting renal disease or dysfunction   [name] J 11/25/2014 11:03 AM

## 2014-11-26 LAB — HEMOGLOBIN A1C
HEMOGLOBIN A1C: 6.4 % — AB (ref 4.8–5.6)
MEAN PLASMA GLUCOSE: 137 mg/dL

## 2014-11-26 LAB — BASIC METABOLIC PANEL
Anion gap: 9 (ref 5–15)
BUN: 50 mg/dL — AB (ref 6–20)
CHLORIDE: 102 mmol/L (ref 101–111)
CO2: 24 mmol/L (ref 22–32)
CREATININE: 2.76 mg/dL — AB (ref 0.61–1.24)
Calcium: 9.5 mg/dL (ref 8.9–10.3)
GFR calc Af Amer: 26 mL/min — ABNORMAL LOW (ref >60)
GFR calc non Af Amer: 22 mL/min — ABNORMAL LOW (ref >60)
GLUCOSE: 134 mg/dL — AB (ref 65–99)
POTASSIUM: 4.2 mmol/L (ref 3.5–5.1)
SODIUM: 135 mmol/L (ref 135–145)

## 2014-11-26 LAB — GLUCOSE, CAPILLARY
GLUCOSE-CAPILLARY: 112 mg/dL — AB (ref 65–99)
Glucose-Capillary: 129 mg/dL — ABNORMAL HIGH (ref 65–99)

## 2014-11-26 LAB — HEPATITIS B SURFACE ANTIGEN: Hepatitis B Surface Ag: NEGATIVE

## 2014-11-26 LAB — HEPATITIS C ANTIBODY

## 2014-11-26 MED ORDER — POTASSIUM CHLORIDE CRYS ER 20 MEQ PO TBCR: 20.0000 meq | EXTENDED_RELEASE_TABLET | Freq: Every day | ORAL | Status: AC

## 2014-11-26 MED ORDER — TORSEMIDE 20 MG PO TABS: 60.0000 mg | ORAL_TABLET | Freq: Every day | ORAL | Status: DC

## 2014-11-26 MED ORDER — ISOSORB DINITRATE-HYDRALAZINE 20-37.5 MG PO TABS: 1.0000 | ORAL_TABLET | Freq: Three times a day (TID) | ORAL | Status: DC

## 2014-11-26 MED ORDER — ASPIRIN 81 MG PO TBEC: 81.0000 mg | DELAYED_RELEASE_TABLET | Freq: Every day | ORAL | Status: DC

## 2014-11-26 NOTE — Discharge Summary (Signed)
Physician Discharge Summary  [name] [id number] DOB: 28-Jul-1945 DOA: 11/22/2014  PCP: Wenda Low, MD  Admit date: 11/22/2014 Discharge date: 11/26/2014  Recommendations for Outpatient Follow-up:  1. Pt will need to follow up with PCP in 1 weeks post discharge 2. Please obtain BMP in one week 3. Follow up with Dr. Loralie Champagne on 12/01/14  Discharge Diagnoses:  Acute respiratory failure with hypoxia -Secondary to CHF exacerbation -Stable on room air Acute on chronic systolic and diastolic CHF -0000000 echocardiogram EF 99991111, grade 2 diastolic dysfunction, PAP 53 -S/p medtronic ICD 2/14 -lasix gtt-->torsemide on 9/15 -06/10/2014 discharge weight 172 pounds -11/22/2014 admission weight 204 pounds -11/26/14 discharge weight 195 pounds -Neg 9 pounds for the admission; Neg 4.3L -appreciate CHF team followup -abdominal US--no ascites -Continue BiDil, carvedilol -Check HIV--neg -hep B surface antigen, hep C antibody--negative CKD stage IV -Baseline creatinine 2.1-2.5 -Monitor renal function with diuresis -serum creatinine 2.7 on day of d/c CAD s/p CABG 2006  -no CP presently -09/02/2013 Myoview stress test negative for ischemia Diabetes mellitus type 2 with renal complications -novolog sliding scale during the hospitalization -Check hemoglobin A1c--6.4 -pt will resume amaryl after d/c Right carotid stenosis  -status post stent 10/20/2013 -Continue aspirin and Plavix  Hypertension -Well-controlled -Continue hydralazine, Imdur, carvedilol Hyperlipidemia -Continue statin History of polysubstance abuse -No longer using cocaine -Urine drug screen negative -40-pack-year history of tobacco--quit 20 years ago -History of daily alcohol use--quit 20 years ago  Discharge Condition: stable  Disposition: home Follow-up Information    Follow up with Loralie Champagne, MD. Go on 12/01/2014.   Specialty:  Cardiology   Why:  at 0920 am in [facility]  cl[facility]--gate code 0090--please bring all medications to appt   Contact information:   [address] [place] [address] [phone]       Follow up with [facility].   Specialty:  [place]   Why:  They will do your home health care at your home   Contact information:   [place] [address] [phone]       Follow up with [facility] On 12/13/2014.   Why:  at 1145 am for EP follow up/consider CRT upgrade.   Contact information:   [address] [place] [phone]       Diet:heart healthy Wt Readings from Last 3 Encounters:  11/26/14 88.497 kg (195 lb 1.6 oz)  10/06/14 87.227 kg (192 lb 4.8 oz)  08/23/14 85.276 kg (188 lb)    History of present illness:  69 year old male with a history of systolic and diastolic CHF, LBBB, CAD status post bypass grafting x3 in 2006 with a LIMA to his LAD, vein to circumflex and PDA, CKD stage III-IV, diabetes mellitus, hypertension, COPD, BiV ICD placed in February 2014 right carotid stenosis status post stent 10/20/2013 presented with shortness of breath 3 days and pulmonary edema on chest x-ray. The patient was admitted in April 2016 with CHF exacerbation with a discharge weight of 172 pounds. Echocardiogram in October 2015 revealed EF 40-45% with akinesis of the basal-inferoseptal myocardium; akinesis of the basal-mid-inferior myocardium. The patient was started on intravenous furosemide with good clinical response. The patient was on a furosemide drip for 24 hours. He was subsequently transitioned to oral torsemide. The patient remained clinically stable. The patient will be discharged home with torsemide 60 mg daily.    Consultants: Heart Failure team  Discharge Exam:  Filed Vitals:   11/26/14 0800  BP: 122/78  Pulse: 75  Temp: 98.1 F (36.7 C)  Resp: 19   Filed Vitals:   11/25/14 1207 11/25/14 2042 11/26/14 0530  11/26/14 0800  BP: 108/58 112/70 117/63 122/78  Pulse: 83 76 80 75  Temp: 98 F (36.7 C) 98.3 F (36.8 C) 97.9 F (36.6 C) 98.1 F (36.7 C)  TempSrc: Oral Oral Oral Oral  Resp: 18 18  19   Height:      Weight:   88.497 kg (195 lb 1.6 oz)   SpO2: 99% 100% 96% 98%   General: A&O x 3, NAD, pleasant, cooperative Cardiovascular: RRR, no rub, no gallop, no S3 Respiratory: CTAB, no wheeze, no rhonchi Abdomen:soft, nontender, nondistended, positive bowel sounds Extremities: No edema, No lymphangitis, no petechiae  Discharge Instructions      Discharge Instructions    AMB Referral to [facility] Management    Complete by:  As directed   Reason for consult:  HF exacerbation - post hospital follow up for education and monitoring  Diagnoses of:   Heart Failure Diabetes    Expected date of contact:  1-3 days (reserved for hospital discharges)  Please assign to community nurse for transition of care calls for weight monitoring, symptoms, and assess for home visits.  Best contact phone is [phone] (cell).  Questions please call:     Diet - low sodium heart healthy    Complete by:  As directed      Increase activity slowly    Complete by:  As directed             Medication List    STOP taking these medications        furosemide 40 MG tablet  Commonly known as:  LASIX     hydrALAZINE 25 MG tablet  Commonly known as:  APRESOLINE      TAKE these medications        aspirin 81 MG EC tablet  Take 1 tablet (81 mg total) by mouth daily.     atorvastatin 40 MG tablet  Commonly known as:  LIPITOR  Take 40 mg by mouth daily.     carvedilol 6.25 MG tablet  Commonly known as:  COREG  Take 6.25 mg by mouth 2 (two) times daily.     clopidogrel 75 MG tablet  Commonly known as:  PLAVIX  Take 75 mg by mouth daily.     glimepiride 1 MG tablet  Commonly known as:  AMARYL  Take 1 tablet by mouth daily. Take 1 tab daily     guaiFENesin 600 MG 12 hr tablet  Commonly known as:   MUCINEX  Take 1 tablet (600 mg total) by mouth 2 (two) times daily.     isosorbide-hydrALAZINE 20-37.5 MG per tablet  Commonly known as:  BIDIL  Take 1 tablet by mouth 3 (three) times daily.     oxyCODONE-acetaminophen 10-325 MG per tablet  Commonly known as:  PERCOCET  Take 1 tablet by mouth 3 (three) times daily. Take 1 tab three times a day every day per patient     potassium chloride SA 20 MEQ tablet  Commonly known as:  K-DUR,KLOR-CON  Take 1 tablet (20 mEq total) by mouth daily.     torsemide 20 MG tablet  Commonly known as:  DEMADEX  Take 3 tablets (60 mg total) by mouth daily.  Start taking on:  11/27/2014     traZODone 150 MG tablet  Commonly known as:  DESYREL  Take 150 mg by mouth at bedtime.         The results of significant diagnostics from this hospitalization (including imaging, microbiology, ancillary and laboratory) are listed below for reference.    Significant Diagnostic Studies: Dg Chest 2 View  11/22/2014   CLINICAL DATA:  Worsening bilateral lower extremity swelling with shortness of breath for 3 days.  EXAM: CHEST  2 VIEW  COMPARISON:  Chest x-rays dated 06/08/2014 and 12/15/2013.  FINDINGS: Mild cardiomegaly is unchanged. Left chest wall pacemaker/AICD is stable in position. Median sternotomy wires remain intact and normally aligned.  Again noted is mild central pulmonary vascular congestion and bilateral interstitial edema suggesting recurrent volume overload/congestive heart failure. No pleural effusions seen. No pneumothorax. Mild degenerative change again noted within the thoracic spine.  IMPRESSION: Cardiomegaly with central pulmonary vascular congestion and mild interstitial edema suggesting a recurrent/chronic CHF. Appearance is similar to multiple prior studies.   Electronically Signed   By: Franki Cabot M.D.   On: 11/22/2014 16:34   US Abdomen Limited  11/24/2014   CLINICAL DATA:  Distended abdomen.  Assess for ascites.  EXAM: LIMITED ABDOMEN  ULTRASOUND FOR ASCITES  TECHNIQUE: Limited ultrasound survey for ascites was performed in all four abdominal quadrants.  COMPARISON:  None.  FINDINGS: No ascites is identified.  IMPRESSION: No significant ascites identified in the abdomen.   Electronically Signed   By: Abelardo Diesel M.D.   On: 11/24/2014 17:45     Microbiology: No results found for this or any previous visit (from the past 240 hour(s)).   Labs: Basic Metabolic Panel:  Recent Labs Lab 11/22/14 1530 11/23/14 0530 11/24/14 0314 11/25/14 0355 11/26/14 0409  NA 142 142 139 140 135  K 4.2 4.0 3.8 3.7 4.2  CL 110 112* 107 106 102  CO2 22 24 25 25 24   GLUCOSE 69 110* 98 96 134*  BUN 33* 30* 36* 39* 50*  CREATININE 2.45* 2.16* 2.46* 2.53* 2.76*  CALCIUM 9.6 9.6 9.4 9.9 9.5  MG  --   --  2.3  --   --    Liver Function Tests: No results for input(s): AST, ALT, ALKPHOS, BILITOT, PROT, ALBUMIN in the last 168 hours. No results for input(s): LIPASE, AMYLASE in the last 168 hours. No results for input(s): AMMONIA in the last 168 hours. CBC:  Recent Labs Lab 11/22/14 1530  WBC 3.0*  HGB 10.4*  HCT 31.8*  MCV 94.1  PLT 137*   Cardiac Enzymes:  Recent Labs Lab 11/23/14 0015 11/23/14 0530 11/23/14 1112  TROPONINI 0.03 0.03 0.03   BNP: Invalid input(s): POCBNP CBG:  Recent Labs Lab 11/25/14 1034 11/25/14 1553 11/25/14 2034 11/25/14 2152 11/26/14 0529  GLUCAP 115* 139* 71 143* 112*    Time coordinating discharge:  Greater than 30 minutes  Signed:  TAT, DAVID, DO [facility] Pager: [phone] 11/26/2014, 10:48 AM

## 2014-11-26 NOTE — Progress Notes (Signed)
Advanced Heart Failure Rounding Note  Primary Physician: Dr. Wenda Low Primary Cardiologist: Dr. Quay Burow HF: [name]  Subjective:    Feels much better than on admit.  The torsemide still made him pee.  No problems with breathing.  Denies CP, palpitations, lightheadedness, or dizziness.  Weight stable. Out 0.5 Liters.  Cr continues to trend up despite transition to 60 mg Torsemide from Lasix gtt 10.  To date out 4.8L and down 9 lbs.  Objective:   Weight Range: 195 lb 1.6 oz (88.497 kg) Body mass index is 29.67 kg/(m^2).   Vital Signs:   Temp:  [97.9 F (36.6 C)-98.3 F (36.8 C)] 97.9 F (36.6 C) (09/16 0530) Pulse Rate:  [76-83] 80 (09/16 0530) Resp:  [18] 18 (09/15 2042) BP: (108-117)/(58-70) 117/63 mmHg (09/16 0530) SpO2:  [96 %-100 %] 96 % (09/16 0530) Weight:  [195 lb 1.6 oz (88.497 kg)] 195 lb 1.6 oz (88.497 kg) (09/16 0530) Last BM Date: 11/24/14  Weight change: Filed Weights   11/24/14 0453 11/25/14 0539 11/26/14 0530  Weight: 200 lb 4.8 oz (90.855 kg) 195 lb 6.4 oz (88.633 kg) 195 lb 1.6 oz (88.497 kg)    Intake/Output:   Intake/Output Summary (Last 24 hours) at 11/26/14 0710 Last data filed at 11/26/14 0600  Gross per 24 hour  Intake   1000 ml  Output   1500 ml  Net   -500 ml     Physical Exam: General: Well appearing. No resp distress, lying in bed HEENT: normal Neck: supple. JVP 7-8 with HJR. Carotids 2+ bilat; no bruits. No lymphadenopathy or thyromegaly noted. Cor: PMI nondisplaced. RRR. No rubs, gallops or murmurs appreciated Lungs: CTA bilaterally Abdomen: Obese, NT, tight, + distended. No hepatosplenomegaly noted. No bruits or masses. +BS Extremities: no cyanosis, clubbing, rash. No edema Neuro: alert & orientedx3, cranial nerves grossly intact. moves all 4 extremities w/o difficulty. Affect very pleasant   Telemetry: NSR, seldom PVCs  Labs: CBC No results for input(s): WBC, NEUTROABS, HGB, HCT, MCV, PLT in the last 72  hours. Basic Metabolic Panel  Recent Labs  11/24/14 0314 11/25/14 0355 11/26/14 0409  NA 139 140 135  K 3.8 3.7 4.2  CL 107 106 102  CO2 25 25 24   GLUCOSE 98 96 134*  BUN 36* 39* 50*  CALCIUM 9.4 9.9 9.5  MG 2.3  --   --    Liver Function Tests No results for input(s): AST, ALT, ALKPHOS, BILITOT, PROT, ALBUMIN in the last 72 hours. No results for input(s): LIPASE, AMYLASE in the last 72 hours. Cardiac Enzymes  Recent Labs  11/23/14 1112  TROPONINI 0.03    BNP: BNP (last 3 results)  Recent Labs  06/08/14 1444 11/22/14 1530  BNP 558.3* 583.5*    ProBNP (last 3 results)  Recent Labs  12/15/13 1111  PROBNP 8210.0*     D-Dimer No results for input(s): DDIMER in the last 72 hours. Hemoglobin A1C  Recent Labs  11/25/14 0355  HGBA1C 6.4*   Fasting Lipid Panel No results for input(s): CHOL, HDL, LDLCALC, TRIG, CHOLHDL, LDLDIRECT in the last 72 hours. Thyroid Function Tests No results for input(s): TSH, T4TOTAL, T3FREE, THYROIDAB in the last 72 hours.  Invalid input(s): FREET3  Other results:     Imaging/Studies:  US Abdomen Limited  11/24/2014   CLINICAL DATA:  Distended abdomen.  Assess for ascites.  EXAM: LIMITED ABDOMEN ULTRASOUND FOR ASCITES  TECHNIQUE: Limited ultrasound survey for ascites was performed in all four abdominal quadrants.  COMPARISON:  None.  FINDINGS: No ascites is identified.  IMPRESSION: No significant ascites identified in the abdomen.   Electronically Signed   By: Abelardo Diesel M.D.   On: 11/24/2014 17:45    Latest Echo  Latest Cath   Medications:     Scheduled Medications: . aspirin EC  81 mg Oral Daily  . atorvastatin  40 mg Oral Daily  . carvedilol  3.125 mg Oral BID WC  . clopidogrel  75 mg Oral Daily  . heparin  5,000 Units Subcutaneous 3 times per day  . insulin aspart  0-15 Units Subcutaneous TID WC  . insulin aspart  0-5 Units Subcutaneous QHS  . isosorbide-hydrALAZINE  1 tablet Oral TID  . mupirocin  cream   Topical TID  . potassium chloride  20 mEq Oral BID  . sodium chloride  3 mL Intravenous Q12H  . torsemide  60 mg Oral Daily    Infusions:    PRN Medications: sodium chloride, acetaminophen, ipratropium-albuterol, ondansetron (ZOFRAN) IV, sodium chloride, traZODone   Assessment/Plan   [name] is a 69 y.o. with Chronic combined CHF 30-35% s/p Medtronic ICD placement 2/14, CAD s/p CABG x 3, COPD, DM, HTN, LBB, Carotid artery disease s/p stent 8/15 on DAPT, and CKD stage 3-4 who presented with worsening DOE and edema.  1. Acute on chronic combined CHF Echo 9/13 LVEF 30-35%, Grade 2 DD, PA peak pressure 53, RV mildly reduced, Ischemic Cardiomyopathy. S/p medtronic ICD 2/14 (EF decreased from previous 40-45%).  He is not significantly volume overloaded by exam.  - Hold torsemide today and resume tomorrow at 60 mg daily given rise in creatinine. - Continue BB and BIDIL. No ACE/ARB with CKD - Continue fluid/salt restriction, Strict I/Os, daily weights (standing). - [place] Abd without significant ascites - ECG with IVCD 168 msec, will send him to EP after discharge to comment on whether high might benefit from CRT upgrade.  2. CAD s/p CABG 2006 with a LIMA to LAD, vein to the circumflex and PDA.  Myoview stress test performed 09/02/13 showed no ischemia.  Denies CP.  - Continue ASA, Plavix, Statin 3. CKD stage 4: Cr 2.16 -> 2.46 -> 2.53 -> 2.76. - As above, hold diuretic today and restart tomorrow.  4. Hyperlipidemia - Continue statin 5. Hypertension - On coreg 3.125 BID and BiDil 1 tab TID - Leave off amlodipine.  6. Carotid artery disease - Stent placed 8/15. On ASA and plavix - [place] 3/16 showed widely patent 7. DM - Per primary  Dispo: Home hopefully today with [facility] for CHF. CM arranged [place] with [facility]. Has HF appt 9/21.  Length of Stay: 4  Shirley Friar PA-C 11/26/2014, 7:10 AM  Advanced Heart Failure Team Pager [phone] (M-F; 7a - 4p)  Please contact [place] [facility] for night-coverage after hours (4p -7a ) and weekends on [email or web address]  Patient seen with PA, agree with the above note.  Creatinine up a bit to 2.7.  He probably has mild residual volume overload.   - Hold torsemide today, restart at 60 mg daily tomorrow.  - Continue Bidil, beta blocker, ASA/Plavix, statin.  - Think he can go home today.  We will see back in office in  About 1 week.   Loralie Champagne 11/26/2014 8:46 AM

## 2014-11-29 ENCOUNTER — Telehealth (HOSPITAL_COMMUNITY): Payer: Self-pay | Admitting: Surgery

## 2014-11-29 ENCOUNTER — Other Ambulatory Visit: Payer: Self-pay

## 2014-11-29 DIAGNOSIS — Z7982 Long term (current) use of aspirin: Secondary | ICD-10-CM | POA: Diagnosis not present

## 2014-11-29 DIAGNOSIS — E785 Hyperlipidemia, unspecified: Secondary | ICD-10-CM | POA: Diagnosis not present

## 2014-11-29 DIAGNOSIS — I129 Hypertensive chronic kidney disease with stage 1 through stage 4 chronic kidney disease, or unspecified chronic kidney disease: Secondary | ICD-10-CM | POA: Diagnosis not present

## 2014-11-29 DIAGNOSIS — I2510 Atherosclerotic heart disease of native coronary artery without angina pectoris: Secondary | ICD-10-CM | POA: Diagnosis not present

## 2014-11-29 DIAGNOSIS — I5042 Chronic combined systolic (congestive) and diastolic (congestive) heart failure: Secondary | ICD-10-CM | POA: Diagnosis not present

## 2014-11-29 DIAGNOSIS — Z9181 History of falling: Secondary | ICD-10-CM | POA: Diagnosis not present

## 2014-11-29 DIAGNOSIS — E1122 Type 2 diabetes mellitus with diabetic chronic kidney disease: Secondary | ICD-10-CM | POA: Diagnosis not present

## 2014-11-29 DIAGNOSIS — Z951 Presence of aortocoronary bypass graft: Secondary | ICD-10-CM | POA: Diagnosis not present

## 2014-11-29 DIAGNOSIS — I252 Old myocardial infarction: Secondary | ICD-10-CM | POA: Diagnosis not present

## 2014-11-29 DIAGNOSIS — J449 Chronic obstructive pulmonary disease, unspecified: Secondary | ICD-10-CM | POA: Diagnosis not present

## 2014-11-29 DIAGNOSIS — N184 Chronic kidney disease, stage 4 (severe): Secondary | ICD-10-CM | POA: Diagnosis not present

## 2014-11-29 DIAGNOSIS — M1990 Unspecified osteoarthritis, unspecified site: Secondary | ICD-10-CM | POA: Diagnosis not present

## 2014-11-29 NOTE — Patient Outreach (Signed)
[facility]) Care Management  11/29/2014  Kimo Wallo 03-18-1945 [id number]  Subjective: Member denies any shortness of breath, states he is weighing self daily and reports his weight was 196 pounds today. Member states he has all the medications on his discharge sheet. Member acknowledged he has an appointment with the heart failure clinic on tomorrow and stated he is going to ride the bus. Member states signs of heart failure exacerbation "shortness of breath, feet swelling, weight".     Assessment: 69 year old with admitted to the hospital 9/12-16 with heart failure. History of chronic kidney disease, COPD, HTN, diabetes. Home health agency has contacted member and will be making a visit today.  Medications reviewed. RNCM reviewed medications. RNCM asked member to get his fluid pill bottle and member came back to the phone and read off "torsemide 20 mg". RNCM reinforced he is supposed to take three tablets daily. member verbalized understanding.   Reviewed signs/symptoms of exacerbation of heart failure.  RNCM reinforced importance of attending appointment tomorrow. RNCM informed member that if he has any problems with his transportation to call RNCM. RNCM states usually need a day notification, but call if there are any problems.  RNCM encouraged member to contact [facility] [phone] for any questions or concerns. RNCM advised member to notify MD of any changes in condition prior to scheduled appointments. RNCM provided 24 hour nurse advise line number [phone].  RNCM confirmed member is aware of 911 services for urgent emergency needs.   Plan: Telephonic transition of care call next week. Care coordination with home health   [name], RN, MSN, [place] Coordinator Cell: [phone]

## 2014-11-29 NOTE — Telephone Encounter (Signed)
Heart Failure Nurse Navigator Post Discharge Telephone Call  I called to check on Mr. Blackley after his recent hospitalization.  He says that he has been weighing daily and weight today was 206 lbs versus 195.1 lbs on 9/16 (date of discharge)?  He denies any increased swelling or SOB.  He is supposed to be taking Torsemide 60 mg daily and at first told me he was taking Furosemide.  However after clarification he says it is Torsemide.  I have asked him to bring all medication bottles with him to his appt.  He says that he is being careful with salt and sodium--however recalls ribs, greens and macaroni and cheese last night for supper.  I reviewed a low sodium diet and high sodium foods to avoid.  I reminded him of his appt on Wednesday with Dr. Aundra Dubin.  He says that he will take the bus to get here.  I encouraged him to call me with concerns or questions related to his HF.

## 2014-11-29 NOTE — Telephone Encounter (Signed)
After discussion with Dr. Aundra Dubin.  Mr Gfeller's appt was moved up to tomorrow--11/30/14.  Mr Katzenstein aware and agreeable.

## 2014-11-30 ENCOUNTER — Ambulatory Visit (HOSPITAL_COMMUNITY)
Admission: RE | Admit: 2014-11-30 | Discharge: 2014-11-30 | Disposition: A | Discharge status: Home / Self Care | Payer: Medicare Other | Source: Ambulatory Visit | Attending: Cardiology | Admitting: Cardiology

## 2014-11-30 ENCOUNTER — Encounter (HOSPITAL_COMMUNITY): Payer: Self-pay

## 2014-11-30 VITALS — BP 130/64 | HR 70 | Wt 199.8 lb

## 2014-11-30 DIAGNOSIS — Z95810 Presence of automatic (implantable) cardiac defibrillator: Secondary | ICD-10-CM | POA: Insufficient documentation

## 2014-11-30 DIAGNOSIS — Z833 Family history of diabetes mellitus: Secondary | ICD-10-CM | POA: Insufficient documentation

## 2014-11-30 DIAGNOSIS — I255 Ischemic cardiomyopathy: Secondary | ICD-10-CM | POA: Diagnosis not present

## 2014-11-30 DIAGNOSIS — Z87891 Personal history of nicotine dependence: Secondary | ICD-10-CM | POA: Diagnosis not present

## 2014-11-30 DIAGNOSIS — Z951 Presence of aortocoronary bypass graft: Secondary | ICD-10-CM | POA: Diagnosis not present

## 2014-11-30 DIAGNOSIS — E785 Hyperlipidemia, unspecified: Secondary | ICD-10-CM | POA: Diagnosis not present

## 2014-11-30 DIAGNOSIS — E1122 Type 2 diabetes mellitus with diabetic chronic kidney disease: Secondary | ICD-10-CM | POA: Diagnosis not present

## 2014-11-30 DIAGNOSIS — I2510 Atherosclerotic heart disease of native coronary artery without angina pectoris: Secondary | ICD-10-CM | POA: Diagnosis not present

## 2014-11-30 DIAGNOSIS — Z79899 Other long term (current) drug therapy: Secondary | ICD-10-CM | POA: Insufficient documentation

## 2014-11-30 DIAGNOSIS — I5042 Chronic combined systolic (congestive) and diastolic (congestive) heart failure: Secondary | ICD-10-CM

## 2014-11-30 DIAGNOSIS — I252 Old myocardial infarction: Secondary | ICD-10-CM | POA: Insufficient documentation

## 2014-11-30 DIAGNOSIS — Z8249 Family history of ischemic heart disease and other diseases of the circulatory system: Secondary | ICD-10-CM | POA: Insufficient documentation

## 2014-11-30 DIAGNOSIS — I129 Hypertensive chronic kidney disease with stage 1 through stage 4 chronic kidney disease, or unspecified chronic kidney disease: Secondary | ICD-10-CM | POA: Insufficient documentation

## 2014-11-30 DIAGNOSIS — N184 Chronic kidney disease, stage 4 (severe): Secondary | ICD-10-CM | POA: Diagnosis not present

## 2014-11-30 DIAGNOSIS — Z7901 Long term (current) use of anticoagulants: Secondary | ICD-10-CM | POA: Insufficient documentation

## 2014-11-30 DIAGNOSIS — I10 Essential (primary) hypertension: Secondary | ICD-10-CM

## 2014-11-30 DIAGNOSIS — Z7982 Long term (current) use of aspirin: Secondary | ICD-10-CM | POA: Diagnosis not present

## 2014-11-30 DIAGNOSIS — J449 Chronic obstructive pulmonary disease, unspecified: Secondary | ICD-10-CM | POA: Diagnosis not present

## 2014-11-30 LAB — BASIC METABOLIC PANEL
ANION GAP: 10 (ref 5–15)
BUN: 55 mg/dL — ABNORMAL HIGH (ref 6–20)
CALCIUM: 10 mg/dL (ref 8.9–10.3)
CO2: 24 mmol/L (ref 22–32)
Chloride: 103 mmol/L (ref 101–111)
Creatinine, Ser: 3.31 mg/dL — ABNORMAL HIGH (ref 0.61–1.24)
GFR, EST AFRICAN AMERICAN: 21 mL/min — AB (ref >60)
GFR, EST NON AFRICAN AMERICAN: 18 mL/min — AB (ref >60)
Glucose, Bld: 94 mg/dL (ref 65–99)
Potassium: 5.1 mmol/L (ref 3.5–5.1)
Sodium: 137 mmol/L (ref 135–145)

## 2014-11-30 LAB — BRAIN NATRIURETIC PEPTIDE: B NATRIURETIC PEPTIDE 5: 171.7 pg/mL — AB (ref 0.0–100.0)

## 2014-11-30 NOTE — Patient Instructions (Addendum)
STOP Lasix.  TAKE Bidil 2 tablets in the morning and 2 tablets in the evening.  TAKE Torsemide 60mg  (3 tablets) daily.  FOLLOW UP: 2-3 weeks with NP clinic   You have been referred to paramedicine.

## 2014-11-30 NOTE — Progress Notes (Signed)
Patient ID: [name], male   DOB: 19-Aug-1945, 69 y.o.   MRN: [id number] PCP:Dr Husain Primary HF Cardiologist: Dr Aundra Dubin  HPI: [name] is a 69 y.o. with Chronic combined CHF 30-35% s/p Medtronic ICD placement 2/14, CAD s/p CABG x 3, COPD, DM, HTN, LBB, Carotid artery disease s/p stent 8/15 on DAPT, and CKD stage 3-4.   Admitted to [facility] 9/12 through 11/26/14. Diuresed with lasix drip and later transitioned to torsemide 60 mg daily. No ace, spiro, or dig with CKD. Discharge weight was 195 pounds.   Today he returns for post hospital follow up. Overall feeling pretty good. Denies SOB/PND/Orthopnea/CP. Denies dizziness. Not weighing regularly. Taking lasix and torsemide. He is able to pay for medications. Lives alone. Rides the bus to appointments. Able to walk around the grocery store. He is able to read pill bottles.    ROS: All systems negative except as listed in HPI, PMH and Problem List.  SH:  Social History   Social History  . Marital Status: Divorced    Spouse Name: N/A  . Number of Children: N/A  . Years of Education: N/A   Occupational History  . Not on file.   Social History Main Topics  . Smoking status: Former Smoker    Quit date: 04/27/82  . Smokeless tobacco: Never Used  . Alcohol Use: No  . Drug Use: 7.00 per week    Special: Marijuana  . Sexual Activity: Not on file   Other Topics Concern  . Not on file   Social History Narrative    FH:  Family History  Problem Relation Age of Onset  . Cancer Mother   . Hypertension Mother   . Cancer Sister   . Diabetes Brother     Past Medical History  Diagnosis Date  . Hypertension     2D ECHO, 04/01/2012 - EF [id number], systolic function severely reduced, moderate hypokinesis of the inferolateral, inferior, and inferoseptal myocardium  . Diabetes mellitus   . Chronic combined systolic and diastolic CHF, NYHA class 2     NUC, 11/25/2009 - No evidence for a reversible defect or ischemia, inferior wall infarct  with hypokinesia along the inferior wall, EF-34%  . S/P CABG x 3   . Bronchitis   . Chest pain at rest 04/01/2012  . Acute respiratory failure, requiring BiPap, now with diuresing improved. 04/01/2012  . At risk for sudden cardiac death Apr 27, 2012  . Cardiomyopathy, ischemic 2012-04-27  . Myocardial infarction   . Coronary artery disease   . Dysrhythmia     LBBB  . Chronic kidney disease     STAGE 3   . Carotid artery disease     status post right internal carotid artery stenting 10/20/13  . Automatic implantable cardioverter-defibrillator in situ   . Arthritis   . Hyperlipidemia   . Shortness of breath     Current Outpatient Prescriptions  Medication Sig Dispense Refill  . aspirin EC 81 MG EC tablet Take 1 tablet (81 mg total) by mouth daily. 30 tablet 0  . atorvastatin (LIPITOR) 40 MG tablet Take 40 mg by mouth daily.     . clopidogrel (PLAVIX) 75 MG tablet Take 75 mg by mouth daily.    Marland Kitchen oxyCODONE-acetaminophen (PERCOCET) 10-325 MG per tablet Take 1 tablet by mouth 3 (three) times daily. Take 1 tab three times a day every day per patient  0  . potassium chloride SA (K-DUR,KLOR-CON) 20 MEQ tablet Take 1 tablet (20 mEq total) by mouth daily.  15 tablet 0  . torsemide (DEMADEX) 20 MG tablet Take 3 tablets (60 mg total) by mouth daily. 90 tablet 6  . traZODone (DESYREL) 150 MG tablet Take 150 mg by mouth at bedtime.    . carvedilol (COREG) 6.25 MG tablet Take 6.25 mg by mouth 2 (two) times daily.     Marland Kitchen glimepiride (AMARYL) 1 MG tablet Take 1 tablet by mouth daily. Take 1 tab daily    . guaiFENesin (MUCINEX) 600 MG 12 hr tablet Take 1 tablet (600 mg total) by mouth 2 (two) times daily. (Patient not taking: Reported on 11/30/2014) 40 tablet 0  . isosorbide-hydrALAZINE (BIDIL) 20-37.5 MG per tablet Take 1 tablet by mouth 3 (three) times daily. (Patient not taking: Reported on 11/30/2014) 90 tablet 0   No current facility-administered medications for this encounter.    Filed Vitals:    11/30/14 1544  BP: 130/64  Pulse: 70  Weight: 199 lb 12.8 oz (90.629 kg)  SpO2: 96%    PHYSICAL EXAM:  General:  Well appearing. No resp difficulty. Ambulated in the clinic without difficulty.  HEENT: normal Neck: supple. JVP flat. Carotids 2+ bilaterally; no bruits. No lymphadenopathy or thryomegaly appreciated. Cor: PMI normal. Regular rate & rhythm. No rubs, gallops or murmurs. Lungs: clear Abdomen: soft, nontender, nondistended. No hepatosplenomegaly. No bruits or masses. Good bowel sounds. Extremities: no cyanosis, clubbing, rash, edema Neuro: alert & orientedx3, cranial nerves grossly intact. Moves all 4 extremities w/o difficulty. Affect pleasant.      ASSESSMENT & PLAN: 1. Chronic combined CHF Echo 11/23/2014  LVEF 30-35%, Grade 2 DD, PA peak pressure 53, RV mildly reduced, Ischemic Cardiomyopathy. S/p medtronic ICD 2/14 (EF decreased from previous 40-45%).  Today after careful review of meds he has been taking lasix and torsemide and taking bidil twice a day.  NYHA II. Volume status today is stable. Stop lasix and he is instructed to only take torsemide 60 mg daily. - Continue carvedilol 6.25 mg twice a day. No dig with CKD.  - Increase bidil to 2 tablets twice daily. (wont take 3 times a day)  - No ace or spiro with CKD - ECG with IVCD 168 msec, will send him to EP after discharge to comment on whether high might benefit from CRT upgrade.  Today he was provided with a pill box and will be referred to paramedicine.  2. CAD s/p CABG 2006 with a LIMA to LAD, vein to the circumflex and PDA. Myoview stress test performed 09/02/13 showed no ischemia. Denies CP.  - Continue ASA, Plavix, Statin 3. CKD stage 4: Check BMET today. Stop lasix today and continue torsemide as above.  - As above, hold diuretic today and restart tomorrow.  4. Hyperlipidemia - Continue statin 5. Hypertension - On coreg 3.125 BID and BiDil increased to 2 tab twice a day. Continue amlodipine for now  until we get a better handle on his medications.    6. Carotid artery disease - Stent placed 8/15. On ASA and plavix - [place] 3/16 showed widely patent 7. DM  BMET today Follow up in 2-3 weeks.   CLEGG,AMY NP-C  10:33 PM

## 2014-12-01 ENCOUNTER — Encounter (HOSPITAL_COMMUNITY): Payer: Medicare Other

## 2014-12-06 ENCOUNTER — Other Ambulatory Visit: Payer: Self-pay

## 2014-12-06 NOTE — Patient Outreach (Signed)
[facility]) Care Management  12/06/2014  [name] 10/11/1945 [id number]   Subjective: "I'm doing fine"."They told me to stop the lasix and take torsemide".  Assessment: Transition of care call-spoke with member who reports that he went to his scheduled appointment with the heart failure clinic. Member reports a medication change. Medications reviewed and member initially stated that he was taking torsemide three tablets then later said he only took two tablets today. RNCM discussed weights and member states he is weighing himself daily. Member states his weight today is 198 pounds. Member states his weight yesterday was 197 pounds and the day before he was 195 pounds. RNCM again discussed member's torsemide dosing and member stated he had been taken two tablets and not three. RNCM reinforced prescribed dose is three tablets. RNCM discussed signs and symptoms of exacerbation, increased weight gain, increased shortness of breath, increased swelling of extremities. When [facility] asked about his weight gain member's response was he would not eat much today. RNCM reinforced with member that the quick weight gain is not an indication of a weight gain from fat/eating, but more an indication that his body may be holding onto fluid instead of him peeing it out. Member stated he would take his third ta torsemide tablet today.  Plan: care coordination with home health-[facility]. Follow up with member on Wednesday to discuss home visit. Update heart failure clinic.  [name], RN, MSN, [place] Coordinator Cell: [phone]

## 2014-12-06 NOTE — Patient Outreach (Signed)
[place] [facility]) Care Management  12/06/2014  Mican Coby [birth year] [id number]  Care Coordination-spoke with Peggy-request to speak with the home health nurse assigned to member. Was informed that his nurse is out sick today. RNCM informed of need for home health nurse to follow up with member at next home visit regarding his medications. Peggy was not able to give the date when next home visit is scheduled, but will have another nurse call RNCM back.  Plan: await return call. Continue to follow.  [name], RN, MSN, [place] Coordinator Cell: [phone]

## 2014-12-06 NOTE — Patient Outreach (Signed)
[place]) Care Management  12/06/2014  Tayshun Blitzer 1945/11/05 [id number]  Care Coordination-Received call from Glenetta Hew with [facility]. RNCM informed of torsemide discrepancy related to what member stated he was taking and what was prescribed. RNCM request follow up regarding medication adherence/management. Ms. Felton Clinton states she will send information to [facility] nurse.  Plan: continue to follow.  [name], RN, MSN, [place] Coordinator Cell: [phone]

## 2014-12-06 NOTE — Patient Outreach (Signed)
[place] [facility]) Care Management  12/06/2014  [name] Sep 11, 1945 [id number]   Care Coordination: spoke with the Heather at [place] Failure clinic-Updated on member's reported weights and report from member that he had only been taken two torsemide tablets, but would take prescribed dose of three tablets. Nira Conn acknowledged, prescribed dose is three tablets. Member's next appointment is October 4th.    Plan: follow up with member this week to schedule home visit.  [name], RN, MSN, [place] Coordinator Cell: [phone]

## 2014-12-13 ENCOUNTER — Encounter: Payer: Medicare Other | Admitting: Physician Assistant

## 2014-12-14 ENCOUNTER — Encounter (HOSPITAL_COMMUNITY): Payer: Medicare Other

## 2014-12-14 DIAGNOSIS — I2510 Atherosclerotic heart disease of native coronary artery without angina pectoris: Secondary | ICD-10-CM | POA: Diagnosis not present

## 2014-12-14 DIAGNOSIS — I129 Hypertensive chronic kidney disease with stage 1 through stage 4 chronic kidney disease, or unspecified chronic kidney disease: Secondary | ICD-10-CM | POA: Diagnosis not present

## 2014-12-14 DIAGNOSIS — J449 Chronic obstructive pulmonary disease, unspecified: Secondary | ICD-10-CM | POA: Diagnosis not present

## 2014-12-14 DIAGNOSIS — N184 Chronic kidney disease, stage 4 (severe): Secondary | ICD-10-CM | POA: Diagnosis not present

## 2014-12-14 DIAGNOSIS — I5042 Chronic combined systolic (congestive) and diastolic (congestive) heart failure: Secondary | ICD-10-CM | POA: Diagnosis not present

## 2014-12-14 DIAGNOSIS — E1122 Type 2 diabetes mellitus with diabetic chronic kidney disease: Secondary | ICD-10-CM | POA: Diagnosis not present

## 2014-12-15 DIAGNOSIS — I509 Heart failure, unspecified: Secondary | ICD-10-CM | POA: Diagnosis not present

## 2014-12-15 DIAGNOSIS — N184 Chronic kidney disease, stage 4 (severe): Secondary | ICD-10-CM | POA: Diagnosis not present

## 2014-12-15 DIAGNOSIS — Z23 Encounter for immunization: Secondary | ICD-10-CM | POA: Diagnosis not present

## 2014-12-17 ENCOUNTER — Other Ambulatory Visit: Payer: Self-pay

## 2014-12-17 NOTE — Patient Outreach (Signed)
[facility]) Care Management  12/17/2014  [name] 1945-11-04 [id number]  Subjective:"I am feeling fine". Member reports he is still weighing himself daily and recording weights.  Assessment: 69 year old with recent admission for heart failure. RNCM spoke with member who reports he is doing well. Home health still involved. Member reports he missed his follow up appointment with heart failure clinic because of death in the family. He currently does not have an appointment rescheduled.  Weight today 198. Member to call and reschedule appointment with heart failure clinic. RNCM reviewed upcoming appointment date with cardiologist. Signs/symptoms of heart failure and when to call the doctor reviewed.  Plan: Home visit next week.  [name], RN, MSN, [place] Coordinator Cell: [phone]

## 2014-12-20 ENCOUNTER — Telehealth (HOSPITAL_COMMUNITY): Payer: Self-pay | Admitting: Vascular Surgery

## 2014-12-20 NOTE — Telephone Encounter (Signed)
Left [pt a message to resch missed appt 12/14/14

## 2014-12-21 ENCOUNTER — Other Ambulatory Visit: Payer: Self-pay

## 2014-12-21 VITALS — BP 138/60 | HR 79 | Resp 16 | Ht 68.0 in | Wt 197.0 lb

## 2014-12-21 DIAGNOSIS — I5040 Unspecified combined systolic (congestive) and diastolic (congestive) heart failure: Secondary | ICD-10-CM

## 2014-12-22 NOTE — Patient Outreach (Addendum)
[facility]) Care Management  [place]  12/21/2014   [name] September 11, 1945 [id number]  Subjective:   Objective: BP 138/60 mmHg  Pulse 79  Resp 16  Ht 1.727 m ($Remove'5\' 8"'DcezSCB$ )  Wt 197 lb (89.359 kg)  BMI 29.96 kg/m2  SpO2 98% Skin warm, dry, lungs clear, respirations even unlabored, heart rate regular.  Current Medications:  Current Outpatient Prescriptions  Medication Sig Dispense Refill  . amLODipine (NORVASC) 10 MG tablet Take 10 mg by mouth daily.    Marland Kitchen aspirin EC 81 MG EC tablet Take 1 tablet (81 mg total) by mouth daily. 30 tablet 0  . atorvastatin (LIPITOR) 40 MG tablet Take 40 mg by mouth daily.     . carvedilol (COREG) 6.25 MG tablet Take 6.25 mg by mouth 2 (two) times daily.     . clopidogrel (PLAVIX) 75 MG tablet Take 75 mg by mouth daily.    Marland Kitchen glimepiride (AMARYL) 1 MG tablet Take 1 tablet by mouth daily. Take 1 tab daily    . isosorbide-hydrALAZINE (BIDIL) 20-37.5 MG per tablet Take 1 tablet by mouth 3 (three) times daily. 90 tablet 0  . oxyCODONE-acetaminophen (PERCOCET) 10-325 MG per tablet Take 1 tablet by mouth 3 (three) times daily. Take 1 tab three times a day every day per patient  0  . potassium chloride SA (K-DUR,KLOR-CON) 20 MEQ tablet Take 1 tablet (20 mEq total) by mouth daily. 15 tablet 0  . torsemide (DEMADEX) 20 MG tablet Take 3 tablets (60 mg total) by mouth daily. 90 tablet 6  . traZODone (DESYREL) 150 MG tablet Take 150 mg by mouth at bedtime.    Marland Kitchen guaiFENesin (MUCINEX) 600 MG 12 hr tablet Take 1 tablet (600 mg total) by mouth 2 (two) times daily. (Patient not taking: Reported on 11/30/2014) 40 tablet 0   No current facility-administered medications for this visit.    Functional Status:  In your present state of health, do you have any difficulty performing the following activities: 12/21/2014 11/22/2014  Hearing? N N  Vision? N N  Difficulty concentrating or making decisions? N N  Walking or climbing stairs? N N  Dressing or  bathing? N N  Doing errands, shopping? N N  Preparing Food and eating ? N -  Using the Toilet? N -  In the past six months, have you accidently leaked urine? N -  Do you have problems with loss of bowel control? N -  Managing your Medications? N -  Managing your Finances? N -  Housekeeping or managing your Housekeeping? N -    Fall/Depression Screening: PHQ 2/9 Scores 12/21/2014  PHQ - 2 Score 0   Fall Risk  12/21/2014  Falls in the past year? No    Assessment: 69 year old with recent admission to hospital 9/12-9/16-Transition of Care Program. Member still active with home health. Weight today 167.   Medications reviewed-member reports cost of a refill of Bidil is $150 and states he cannot afford this medication. RNCM reinforced correct dosing for coreg. Instead of taking twice a day, member states he just took both the pills at the same time. Member also had a bottle of Furosemide and stated he also took Furosemide. RNCM reinforced that diuretic he is prescribed to take is Torsemide. X marked on the furosemide to indicate not to take this medication. RNCM reinforced correct way to take each medication.   Appointment adherence-member missed his last appointment. RNCM emphasized the importance of following up with provider visit.  Member called during the visit and left a message requesting for office to call back to reschedule appointment. RNCM reinforced dates of upcoming appointment with cardiologist.  Advanced directives-member reports he would like to complete advanced directive.  Plan: Follow up transition of care next week. Consult [facility] pharmacy and [facility] social work.  THN CM Care Plan Problem One        Most Recent Value   Care Plan Problem One  at risk for readmission   Role Documenting the Problem One  Care Management [place] for Problem One  Active   THN Long Term Goal (31-90 days)  Member will not be readmitted within the next 31 days.   THN Long Term Goal  Start Date  11/29/14   Interventions for Problem One Long Term Goal  home visit, reviewed medications, reinforced sign/symptoms of heart failure exacerbation   THN CM Short Term Goal #1 (0-30 days)  member will attend all scheduled follow up appointments within the next 30 days.   THN CM Short Term Goal #1 Start Date  11/29/14   THN CM Short Term Goal #1 Met Date  -- [not met-missed appointment due to death in the family]   Interventions for Short Term Goal #1  encouraged member to call to reschedule appointment with heart failure clinic.   THN CM Short Term Goal #2 (0-30 days)  member will be able to state heart failure action plan within the next 30 days.   THN CM Short Term Goal #2 Start Date  11/29/14   Interventions for Short Term Goal #2  reviewed signs/symptoms of heart failure exacerbation and when to call. provided [facility] Environmental consultant.   THN CM Short Term Goal #3 (0-30 days)  member will take medications as prescribed   THN CM Short Term Goal #3 Start Date  12/06/14   Interventions for Short Tern Goal #3  pharmacy consult    THN CM Short Term Goal #4 (0-30 days)  member will call to reschedule appointment with heart failure clinic within the week.   THN CM Short Term Goal #4 Start Date  12/17/14   [facility] CM Short Term Goal #4 Met Date  12/21/14   Interventions for Short Term Goal #4  member called and left message requesting to reschedule appointment.     [name], RN, MSN, [place] Coordinator Cell: [phone]

## 2014-12-23 ENCOUNTER — Encounter: Payer: Self-pay | Admitting: *Deleted

## 2014-12-23 ENCOUNTER — Telehealth (HOSPITAL_COMMUNITY): Payer: Self-pay

## 2014-12-23 MED ORDER — ISOSORB DINITRATE-HYDRALAZINE 20-37.5 MG PO TABS: 1.0000 | ORAL_TABLET | Freq: Three times a day (TID) | ORAL | Status: DC

## 2014-12-23 NOTE — Patient Outreach (Addendum)
[facility]) Care Management  12/23/2014  Dee Steeno [birth year] [id number]   Request from [name], RN to assign Pharmacy and SW, assigned Harlow Asa, PharmD and Nat Christen, LCSW.  Thanks, Ronnell Freshwater. [place], [place] Assistant Phone: [phone] Fax: [phone]

## 2014-12-23 NOTE — Telephone Encounter (Signed)
Bidil refill sent to [facility] outpatient pharmacy

## 2014-12-24 ENCOUNTER — Telehealth: Payer: Self-pay | Admitting: Cardiovascular Disease

## 2014-12-24 ENCOUNTER — Other Ambulatory Visit: Payer: Self-pay | Admitting: Pharmacist

## 2014-12-24 DIAGNOSIS — I5042 Chronic combined systolic (congestive) and diastolic (congestive) heart failure: Secondary | ICD-10-CM | POA: Diagnosis not present

## 2014-12-24 DIAGNOSIS — E1122 Type 2 diabetes mellitus with diabetic chronic kidney disease: Secondary | ICD-10-CM | POA: Diagnosis not present

## 2014-12-24 DIAGNOSIS — N184 Chronic kidney disease, stage 4 (severe): Secondary | ICD-10-CM | POA: Diagnosis not present

## 2014-12-24 DIAGNOSIS — I2510 Atherosclerotic heart disease of native coronary artery without angina pectoris: Secondary | ICD-10-CM | POA: Diagnosis not present

## 2014-12-24 DIAGNOSIS — I129 Hypertensive chronic kidney disease with stage 1 through stage 4 chronic kidney disease, or unspecified chronic kidney disease: Secondary | ICD-10-CM | POA: Diagnosis not present

## 2014-12-24 DIAGNOSIS — J449 Chronic obstructive pulmonary disease, unspecified: Secondary | ICD-10-CM | POA: Diagnosis not present

## 2014-12-24 NOTE — Telephone Encounter (Signed)
Forwarded to Dr. Loletha Grayer for review and advise.

## 2014-12-24 NOTE — Patient Outreach (Signed)
Called Mr. Chihuahua back to let him know that I called his Cardiologist's office at [facility] today about changing the Bidil to separate prescriptions for hydralazine and isosorbide for affordability. Let Mr. Lofstrom know that I requested that the office call back to his home phone number.   Will follow up with Mr. Claydon on 12/27/14.   Harlow Asa, PharmD Clinical Pharmacist [place] Management [phone]

## 2014-12-24 NOTE — Telephone Encounter (Signed)
Benjamine Mola called in stating that when the pt was discharged from the hospital he was prescribed Bidil . She stated that his insurance does not cover this medication and she would advise that the individual medications be prescribed. Please f/u with her and the pt  Thanks

## 2014-12-24 NOTE — Patient Outreach (Addendum)
Codey Trickey is a 69 y.o. male referred to pharmacy for medication management and assistance. Per the referral, patient is having trouble with affording his new prescription for Bidil. Called and spoke with Mr. Alsbrooks who reports that the last time that he picked up the medication, it was about $150. Note per the online [place] search tool of the patient's insurance plan, Bidil is not covered by the patient's plan. However, note that both generic components of Bidil, isosorbide dinitrate and hydralazine are covered as tier 2 generic medications. Let Mr. Bruney know about this coverage information. Mr. Mcclarnon reports that he would be fine with taking the two tablets in place of the Bidil tablet for cost savings. Mr. Files reports that he has only about 10 tablets of the Bidil left (about a 3 day supply). Let Mr. Hemauer know that I will reach out to his Cardiologist's office at [facility] today about changing the Bidil to separate prescriptions for hydralazine and isosorbide for affordability.  Discuss with Mr. Cepero his medication management. Patient reports that he has just started using a pillbox, as setup for him by Nurse Care Manager [name] at their visit this week. Reports that using the box he is able to consistently remember to take his medication three times daily. Offer to come to meet with Mr. Mola in his home to review his medications with him and to make sure that he is comfortable with filling this pillbox for himself. Mr. Hornor accepts. Provide Mr. Lindemann with my phone number.  PLAN:  1) Will call Mr. Magaro Cardiologist's office at [facility] today about changing the Bidil to separate prescriptions for hydralazine and isosorbide for affordability.  2) Will meet with Mr. Macdougall in his home on Monday, 01/03/15 at 9:30 AM to discuss medication management.  Harlow Asa, PharmD Clinical Pharmacist [place]  Management [phone]

## 2014-12-24 NOTE — Patient Outreach (Signed)
Called Mr. Fieser Cardiologist's office at [facility] today about changing the Bidil to separate prescriptions for hydralazine and isosorbide for affordability. Spoke with Rollene Fare in the office. Left message that Bidil is not covered by the patient's plan, but that both generic components of Bidil, isosorbide dinitrate and hydralazine are covered as tier 2 generic medications.   Asked for follow up today to the patient, as Mr. Darnley only has 10 tablets remaining (3 day supply).  Harlow Asa, PharmD Clinical Pharmacist [place] Management [phone]

## 2014-12-26 NOTE — Telephone Encounter (Signed)
Two options: - there is a special arrangement with [facility] to get him this medication at a very low price, supposedly independent of his insurance plan. I believe there is a coupon/info in the sample closet.  OR  - hydralazine 25 mg TID and isosorbide 20 mg TID generic prescriptions, which we can then titrate as necessary for his BP.  Have him call [facility] first, please

## 2014-12-27 ENCOUNTER — Other Ambulatory Visit: Payer: Self-pay | Admitting: *Deleted

## 2014-12-27 ENCOUNTER — Other Ambulatory Visit: Payer: Self-pay | Admitting: Pharmacist

## 2014-12-27 ENCOUNTER — Other Ambulatory Visit: Payer: Self-pay

## 2014-12-27 ENCOUNTER — Encounter: Payer: Self-pay | Admitting: *Deleted

## 2014-12-27 ENCOUNTER — Telehealth: Payer: Self-pay | Admitting: Cardiovascular Disease

## 2014-12-27 MED ORDER — ISOSORB DINITRATE-HYDRALAZINE 20-37.5 MG PO TABS: 1.0000 | ORAL_TABLET | Freq: Three times a day (TID) | ORAL | Status: DC

## 2014-12-27 MED ORDER — HYDRALAZINE HCL 25 MG PO TABS: 25.0000 mg | ORAL_TABLET | Freq: Three times a day (TID) | ORAL | Status: DC

## 2014-12-27 MED ORDER — ISOSORBIDE DINITRATE 20 MG PO TABS: 20.0000 mg | ORAL_TABLET | Freq: Three times a day (TID) | ORAL | Status: DC

## 2014-12-27 NOTE — Telephone Encounter (Signed)
Spoke w/ Benjamine Mola from [facility] regarding pt's Bidil, making sure he was continued on this. Informed her I would send Rx to [facility] so that pt can get filled there. She states he has Medicare - advised that the [place] Patient direct program may not cover at the flat fee but there is a cash option for this med that should still be cheaper than market price - she will call [place] to verify cost out of pocket.  Called pt to inform him of this, also offered samples of Bidil for him - he will be by today to pickup. Voiced thanks for the call.  Option to split hydralazine and isosorbide and dose separately is still an option, as recommended by Dr. Sallyanne Kuster in previous encounter note.

## 2014-12-27 NOTE — Patient Outreach (Signed)
[facility]) Care Management  12/27/2014  Dagmawi Balster [birth year] [id number]  Subjective: "yeah I know when to call the doctor, if I get swelling, weight gain, if I get short of breath. I know when to call".   Assessment: Transition of care-recent admission 9/12-9/16. Member without questions or problems at this time. Member acknowledges speaking with the social worker stating that he has a planned visit for social worker to help assist with completing advanced directives. Also acknowledges speaking with [facility] pharmacist, states he picked up his medication today. RNCM encouraged member to call RNCM or [facility] pharmacist if he has any questions regarding his medications. Member stated he would.  Plan: Home visit 01/13/15  [name], RN, MSN, [place] Coordinator Cell: [phone]

## 2014-12-27 NOTE — Patient Outreach (Signed)
Per Ovid Curd at patient's cardiologist office, Bidil is available at [facility] through a direct to patient program by the manufacturer for a reduced cost. Call [facility] and speak with [employer]. Nira Conn runs a test claim for the patient for Bidil. Per Nira Conn, the cost of Bidil for a 30 day supply for the patient would be $40. Ask Nira Conn to run a test claim for the two separate ingredients, hydralazine and isosorbide. Provide Nira Conn with the patient's insurance information. Per Nira Conn, through the patient's insurance, the cost of each for a 30 day supply would be:  Isosorbide dinitrate: $5.93 Hydralazine: $1.50  Will call to follow up with Ovid Curd in Dr. Victorino December office to let him know of the cost savings to the patient for using the two separate agents instead.  Harlow Asa, PharmD Clinical Pharmacist [place] Management [phone]

## 2014-12-27 NOTE — Patient Outreach (Signed)
Received a phone call from Marylouise Stacks [phone]), paramedic with [facility]. Per Joellen Jersey, [employer] Paramedicine was referred by the heart failure clinic to follow this patient for medication assistance and medication adherence. Reports that she has started seeing Mr. Weinheimer to help with his adherence and will be coming to see him once a week. Reports that she is also aware that he is in need of medication assistance for his Bidil. Reports that [employer] will be helping the patient by providing him with 3 months of the Bidil for free through the [place]. Joellen Jersey states that she will call and follow up with the patient. Advise Katie to be sure to counsel patient to use up his Bidil before picking up his hydralazine and isosorbide prescriptions from his pharmacy, as to not duplicate medications. Joellen Jersey states that she will let the patient know.  Harlow Asa, PharmD Clinical Pharmacist [place] Management [phone]

## 2014-12-27 NOTE — Addendum Note (Signed)
Addended by: Charlotte Sanes R on: 12/27/2014 10:30 AM   Modules accepted: Orders, Medications

## 2014-12-27 NOTE — Telephone Encounter (Addendum)
Spoke to [place] - pt will want the generic options. Referred to Dr. Victorino December recommended doses. Rx sent to his preferred pickup pharmacy ([facility] [address]) - Benjamine Mola will contact and notify him this is available.  Notified [place] to cancel dispense of Bidil.

## 2014-12-27 NOTE — Patient Outreach (Signed)
Called to follow up with [name] in Dr. Victorino December office to let him know of the cost savings to the patient for using the two separate agents instead.  Let him know that I asked Nira Conn at [facility] to run a test claim for the patient for Bidil. Per Nira Conn, the cost of Bidil for a 30 day supply for the patient would be $40. Ask Nira Conn to run a test claim for the two separate ingredients, hydralazine and isosorbide. Provide Nira Conn with the patient's insurance information. Per Nira Conn, through the patient's insurance, the cost of each for a 30 day supply would be:  Isosorbide dinitrate: $5.93 Hydralazine: $1.50  [name] states that he has called the patient and let the patient know that he has samples of the Bidil that he will provide to the patient. However, [name] states that he will also call 30 day supplies of these two separate prescriptions into the patient's [place] in order to provide the patient with future cost savings.  Harlow Asa, PharmD Clinical Pharmacist [place] Management [phone]

## 2014-12-27 NOTE — Patient Outreach (Signed)
Called to follow up with [name]. [name] reports that he spoke with Ovid Curd from his cardiologist's office this morning. Reports that he is getting ready to head over to the office to pick up the samples of Bidil.   Let [name] know that even with the special deal at [facility], he would have a cost savings of getting the isosorbide and hydralazine separately versus taking the Bidil, less than $10 total/ month versus $40/ month, and that Ovid Curd with Dr. Victorino December office has called in the two separate prescriptions for hydralazine and isosorbide into the patient's local [place] for him to pick up when he is done with the samples of Bidil. [name] states that he appreciates this cost savings and again states that he wants this cheaper option. Counsel patient about the importance of not starting the hydralazine and isosorbide prescriptions from [facility] until he has finished taking the Bidil samples. Patient verbalizes understanding.   Remind patient that I will see him next week for our appointment on 01/03/15.  Harlow Asa, PharmD Clinical Pharmacist [place] Management [phone]

## 2014-12-27 NOTE — Patient Outreach (Signed)
John [name]  John [name]  12/27/2014  John [name] 08/03/1945 [id number]  Subjective:    "I guess you can send me some papers to fill out so I can decide what I want to happen to me when I die".  Objective:   CSW agreed to mail patient an Financial controller ([place] documents) packet and assist with completion.  Current Medications:  Current Outpatient Prescriptions  Medication Sig Dispense Refill  . amLODipine (NORVASC) 10 MG tablet Take 10 mg by mouth daily.    Marland Kitchen aspirin EC 81 MG EC tablet Take 1 tablet (81 mg total) by mouth daily. 30 tablet 0  . atorvastatin (LIPITOR) 40 MG tablet Take 40 mg by mouth daily.     . carvedilol (COREG) 6.25 MG tablet Take 6.25 mg by mouth 2 (two) times daily.     . clopidogrel (PLAVIX) 75 MG tablet Take 75 mg by mouth daily.    Marland Kitchen glimepiride (AMARYL) 1 MG tablet Take 1 tablet by mouth daily. Take 1 tab daily    . guaiFENesin (MUCINEX) 600 MG 12 hr tablet Take 1 tablet (600 mg total) by mouth 2 (two) times daily. (Patient not taking: Reported on 11/30/2014) 40 tablet 0  . hydrALAZINE (APRESOLINE) 25 MG tablet Take 1 tablet (25 mg total) by mouth 3 (three) times daily. 90 tablet 5  . isosorbide dinitrate (ISORDIL) 20 MG tablet Take 1 tablet (20 mg total) by mouth 3 (three) times daily. 90 tablet 5  . oxyCODONE-acetaminophen (PERCOCET) 10-325 MG per tablet Take 1 tablet by mouth 3 (three) times daily. Take 1 tab three times a day every day per patient  0  . potassium chloride SA (K-DUR,KLOR-CON) 20 MEQ tablet Take 1 tablet (20 mEq total) by mouth daily. 15 tablet 0  . torsemide (DEMADEX) 20 MG tablet Take 3 tablets (60 mg total) by mouth daily. 90 tablet 6  . traZODone (DESYREL) 150 MG tablet Take 150 mg by mouth at bedtime.     No current facility-administered medications for this visit.    Functional Status:  In your present state of health, do you have any difficulty  performing the following activities: 12/27/2014 12/21/2014  Hearing? N N  Vision? N N  Difficulty concentrating or making decisions? N N  Walking or climbing stairs? N N  Dressing or bathing? N N  Doing errands, shopping? N N  Preparing Food and eating ? N N  Using the Toilet? N N  In the past six months, have you accidently leaked urine? N N  Do you have problems with loss of bowel control? N N  Managing your Medications? N N  Managing your Finances? N N  Housekeeping or managing your Housekeeping? N N    Fall/Depression Screening:  PHQ 2/9 Scores 12/27/2014 12/27/2014 12/21/2014  PHQ - 2 Score 0 0 0    Assessment:   CSW was able to make initial contact with patient today to perform phone assessment, as well as assess and assist with social [name] needs and services.  CSW introduced self, explained role and types of services provided through [place] [name] ([place] [name]).  CSW further explained to patient that CSW works with patient's RNCM, also with [place] [name], [name]. CSW then explained the reason for the call, indicating that Mrs. Juleen China reported that patient would benefit from social [name] services and resources to assist with completion of his Advanced Directives (Living Will  and Press photographer documents).  CSW obtained two HIPAA compliant identifiers from patient, which included patient's name and date of birth. Patient admitted that he is interested in having CSW mail him an Advanced Directives packet of information for review.  However, patient was not interested in having CSW schedule an appointment with him to assist with completion of the documents.  Patient reported that he will have his daughter, John [name] assist with review and completion.  CSW agreed to mail an Advanced Directives packet of information to patient's home today.  CSW provided patient with CSW's contact information, encouraging patient to contact CSW  directly if he receives the packet of information and changes his mind about wanting assistance with completion.  Plan:  CSW will perform a case closure on patient, as all goals of treatment have been met from social [name] standpoint and no additional social [name] needs have been identified at this time. CSW will notify patient's RNCM with [place] [name], [name] of CSW's plans to close patient's case. CSW will fax a correspondence letter to patient's Primary Care Physician, Dr. Wenda Low to ensure that Dr. Lysle Rubens is aware of CSW's involvement with patient. CSW will submit a request to [place], Care [name] Assistant with [place] [name], to mail an Advanced Directives ([place] documents) packet to patient's home.    John [name], BSW, MSW, LCSW  Licensed Education officer, environmental Health System  Mailing [place] [address] [place], [place] [address] Physical Address-[address], [place], [place] [address] Toll Free Main # [phone] Fax # [phone] Cell # [phone]  Fax # [phone]  [email or web address]

## 2014-12-28 NOTE — Patient Outreach (Signed)
[facility]) Care Management  12/28/2014  [name] 1945-07-16 [id number]   Notification received from [facility], LCSW to send patient an advance directives packet. Packet was mailed out 12/28/2014.  Mercie Balsley L. Nuno Brubacher, [place] Care Management Assistant

## 2014-12-29 NOTE — Telephone Encounter (Signed)
Samples given to patient for Bidil and new Rx's already sent to [facility] for Isosorbide and Hydralazine.

## 2015-01-03 ENCOUNTER — Telehealth (HOSPITAL_COMMUNITY): Payer: Self-pay | Admitting: Vascular Surgery

## 2015-01-03 ENCOUNTER — Ambulatory Visit (INDEPENDENT_AMBULATORY_CARE_PROVIDER_SITE_OTHER): Payer: Medicare Other | Admitting: Cardiovascular Disease

## 2015-01-03 ENCOUNTER — Encounter: Payer: Self-pay | Admitting: Cardiovascular Disease

## 2015-01-03 ENCOUNTER — Other Ambulatory Visit: Payer: Self-pay | Admitting: Pharmacist

## 2015-01-03 VITALS — BP 134/65 | HR 72 | Ht 68.5 in | Wt 204.7 lb

## 2015-01-03 DIAGNOSIS — I5042 Chronic combined systolic (congestive) and diastolic (congestive) heart failure: Secondary | ICD-10-CM

## 2015-01-03 DIAGNOSIS — Z9189 Other specified personal risk factors, not elsewhere classified: Secondary | ICD-10-CM

## 2015-01-03 DIAGNOSIS — I255 Ischemic cardiomyopathy: Secondary | ICD-10-CM | POA: Diagnosis not present

## 2015-01-03 DIAGNOSIS — Z95810 Presence of automatic (implantable) cardiac defibrillator: Secondary | ICD-10-CM

## 2015-01-03 DIAGNOSIS — I10 Essential (primary) hypertension: Secondary | ICD-10-CM | POA: Diagnosis not present

## 2015-01-03 DIAGNOSIS — I5043 Acute on chronic combined systolic (congestive) and diastolic (congestive) heart failure: Secondary | ICD-10-CM

## 2015-01-03 DIAGNOSIS — E785 Hyperlipidemia, unspecified: Secondary | ICD-10-CM
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
## 2015-01-03 NOTE — Telephone Encounter (Signed)
Left pt message to make appt
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
## 2015-01-03 NOTE — Patient Instructions (Signed)
Remote monitoring is used to monitor your Pacemaker or ICD from home. This monitoring reduces the number of office visits required to check your device to one time per year. It allows [place] to monitor the functioning of your device to ensure it is working properly. You are scheduled for a device check from home on April 06, 2015. You may send your transmission at any time that day. If you have a wireless device, the transmission will be sent automatically. After your physician reviews your transmission, you will receive a postcard with your next transmission date.  Dr. Sallyanne Kuster recommends that you schedule a follow-up appointment in: ONE YEAR.

## 2015-01-04 ENCOUNTER — Encounter (HOSPITAL_COMMUNITY): Payer: Self-pay | Admitting: Vascular Surgery

## 2015-01-04 ENCOUNTER — Telehealth: Payer: Self-pay | Admitting: Cardiovascular Disease

## 2015-01-04 DIAGNOSIS — Z79899 Other long term (current) drug therapy: Secondary | ICD-10-CM

## 2015-01-04 DIAGNOSIS — I5041 Acute combined systolic (congestive) and diastolic (congestive) heart failure: Secondary | ICD-10-CM

## 2015-01-04 DIAGNOSIS — R0600 Dyspnea, unspecified: Secondary | ICD-10-CM

## 2015-01-04 NOTE — Telephone Encounter (Signed)
Returned call to patient.He stated he was returning someone's call.Advised I will send message to [place].

## 2015-01-04 NOTE — Telephone Encounter (Signed)
Pt returning a call,he does not know who it was.

## 2015-01-05 ENCOUNTER — Telehealth: Payer: Self-pay | Admitting: *Deleted

## 2015-01-05 DIAGNOSIS — I255 Ischemic cardiomyopathy: Secondary | ICD-10-CM

## 2015-01-05 NOTE — Telephone Encounter (Signed)
-----   Message from Sanda Klein, MD sent at 01/04/2015  9:21 AM EDT -----  Please increase torsemide to 80 mg (420 mg tablets) daily. Check BMET and BNP in one week. Heart failure clinic appointment in 2 weeks. Please make sure he has had the referral to EP for upgrade to CRT. It is mentioned in the discharge summary and in the heart failure clinic note, but I don't see an appointment made yet.

## 2015-01-05 NOTE — Telephone Encounter (Signed)
[facility] for med change.  Order placed for EP referral for CRT upgrade.

## 2015-01-05 NOTE — Telephone Encounter (Signed)
Message left to return call.

## 2015-01-06 ENCOUNTER — Telehealth: Payer: Self-pay | Admitting: *Deleted

## 2015-01-06 DIAGNOSIS — E7800 Pure hypercholesterolemia, unspecified: Secondary | ICD-10-CM | POA: Diagnosis not present

## 2015-01-06 DIAGNOSIS — E1122 Type 2 diabetes mellitus with diabetic chronic kidney disease: Secondary | ICD-10-CM | POA: Diagnosis not present

## 2015-01-06 DIAGNOSIS — I10 Essential (primary) hypertension: Secondary | ICD-10-CM | POA: Diagnosis not present

## 2015-01-06 DIAGNOSIS — N184 Chronic kidney disease, stage 4 (severe): Secondary | ICD-10-CM | POA: Diagnosis not present

## 2015-01-06 DIAGNOSIS — I5020 Unspecified systolic (congestive) heart failure: Secondary | ICD-10-CM | POA: Diagnosis not present
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
## 2015-01-06 NOTE — Telephone Encounter (Signed)
[place] 3rd day in a row.  Stressed on message I need him to call to get medication adjustment.  Message sent to HF clinic for appt in 2 weeks.  Appointment has been scheduled for EP appt.  Will place order for labs to be done in one week after increasing Torsemide.

## 2015-01-06 NOTE — Telephone Encounter (Signed)
Patient instructed to increase torsemide to 80mg  daily and labs in one week (order mailed to patient).  Patient voiced understanding. Also told patient to expect a call from the HF clinic for an appt.

## 2015-01-06 NOTE — Addendum Note (Signed)
Addended by: Janett Labella A on: 01/06/2015 07:51 AM   Modules accepted: Orders

## 2015-01-07 ENCOUNTER — Encounter: Payer: Medicare Other | Admitting: Cardiology

## 2015-01-10 ENCOUNTER — Ambulatory Visit: Payer: Medicare Other | Admitting: Pharmacist

## 2015-01-10 DIAGNOSIS — E119 Type 2 diabetes mellitus without complications: Secondary | ICD-10-CM | POA: Diagnosis not present

## 2015-01-10 DIAGNOSIS — H40012 Open angle with borderline findings, low risk, left eye: Secondary | ICD-10-CM | POA: Diagnosis not present

## 2015-01-10 DIAGNOSIS — H04123 Dry eye syndrome of bilateral lacrimal glands: Secondary | ICD-10-CM | POA: Diagnosis not present

## 2015-01-10 DIAGNOSIS — H25813 Combined forms of age-related cataract, bilateral: Secondary | ICD-10-CM | POA: Diagnosis not present

## 2015-01-11 ENCOUNTER — Telehealth (HOSPITAL_COMMUNITY): Payer: Self-pay | Admitting: *Deleted

## 2015-01-11 NOTE — Progress Notes (Signed)
Encounter in error

## 2015-01-11 NOTE — Telephone Encounter (Signed)
Patient notified of med changes and repeat labs in one week 01/06/15.  Patient voiced understanding.

## 2015-01-11 NOTE — Telephone Encounter (Signed)
[name] with para-medicine called to ask about his increase in Torsemide.  Per Dr Sallyanne Kuster take 80 mg of Lasix. Labs in one week.  I scheduled pt for lab work tomorrow 11/2 to have a repeat BMP.

## 2015-01-12 ENCOUNTER — Other Ambulatory Visit: Payer: Self-pay | Admitting: Pharmacist

## 2015-01-12 ENCOUNTER — Ambulatory Visit: Payer: Self-pay | Admitting: Pharmacist

## 2015-01-12 ENCOUNTER — Ambulatory Visit (HOSPITAL_COMMUNITY)
Admission: RE | Admit: 2015-01-12 | Discharge: 2015-01-12 | Disposition: A | Discharge status: Home / Self Care | Payer: Medicare Other | Source: Ambulatory Visit | Attending: Cardiology | Admitting: Cardiology

## 2015-01-12 DIAGNOSIS — I5022 Chronic systolic (congestive) heart failure: Secondary | ICD-10-CM

## 2015-01-12 DIAGNOSIS — I5043 Acute on chronic combined systolic (congestive) and diastolic (congestive) heart failure: Secondary | ICD-10-CM

## 2015-01-12 LAB — BASIC METABOLIC PANEL
Anion gap: 11 (ref 5–15)
BUN: 58 mg/dL — AB (ref 6–20)
CHLORIDE: 108 mmol/L (ref 101–111)
CO2: 19 mmol/L — AB (ref 22–32)
CREATININE: 3.62 mg/dL — AB (ref 0.61–1.24)
Calcium: 9.4 mg/dL (ref 8.9–10.3)
GFR calc Af Amer: 18 mL/min — ABNORMAL LOW (ref >60)
GFR calc non Af Amer: 16 mL/min — ABNORMAL LOW (ref >60)
GLUCOSE: 68 mg/dL (ref 65–99)
Potassium: 4.8 mmol/L (ref 3.5–5.1)
Sodium: 138 mmol/L (ref 135–145)

## 2015-01-12 NOTE — Patient Outreach (Signed)
Call to follow up with Marylouise Stacks from [facility] [phone]) who is also following this patient. Joellen Jersey states that she is currently following patient in person on a weekly basis. Reports that she has been helping to fill his pillbox at each appointment. Reports that she has also advised patient to keep his medications in their original pill bottles and to take his carvedilol twice daily.    Let Joellen Jersey know that I will stop following Mr. Coulibaly for now, but to please call me with any questions. Also provide Katie with the phone number for Nurse Care Manager [name].  Harlow Asa, PharmD Clinical Pharmacist [facility] [phone]

## 2015-01-12 NOTE — Patient Outreach (Signed)
Had appointment to meet with patient this morning, 01/12/15, at 9 AM at his home. Present to patient's home and he does not answer the door. Try to reach patient on each of his phone numbers, but am unsuccessful with reaching him.  After leaving the patient's home, receive a phone call from the patient stating that he is on his way to another appointment at the hospital. Remind patient of our appointment to review his pillbox. [name] states that [name] from paramedicine has come to fill this with him. Reports that [name] will be coming again next week and does not need my assistance at this time. Reports that he has no further medication questions for me at this time. Confirm that patient has my contact information.  Will stop following patient for now. However, will also reach out to [name] with [facility].  Harlow Asa, PharmD Clinical Pharmacist [facility] [phone]

## 2015-01-13 ENCOUNTER — Other Ambulatory Visit: Payer: Self-pay

## 2015-01-13 NOTE — Telephone Encounter (Signed)
Typo pt is taking Torsemide not Lasix

## 2015-01-13 NOTE — Patient Outreach (Signed)
[facility]) Care Management  [facility] Care Manager  01/13/2015   Ivie Kleinknecht 08/26/1945 [id number]  Subjective: "Yep, I am staying on my medication and wathching what I eat. I been feeling good". "I am all right".  Objective: BP 120/60 mmHg  Pulse 70  Resp 20  Ht 1.727 m (5\' 8" )  Wt 203 lb (92.08 kg)  BMI 30.87 kg/m2  SpO2 94%, Skin warm dry, lungs clear, heart rate regular, 1+ edema lower extremities.  Current Medications:  Current Outpatient Prescriptions  Medication Sig Dispense Refill  . amLODipine (NORVASC) 10 MG tablet Take 10 mg by mouth daily.    Marland Kitchen aspirin EC 81 MG EC tablet Take 1 tablet (81 mg total) by mouth daily. 30 tablet 0  . atorvastatin (LIPITOR) 40 MG tablet Take 40 mg by mouth daily.     . carvedilol (COREG) 6.25 MG tablet Take 6.25 mg by mouth 2 (two) times daily.     . clopidogrel (PLAVIX) 75 MG tablet Take 75 mg by mouth daily.    Marland Kitchen glimepiride (AMARYL) 1 MG tablet Take 1 tablet by mouth daily. Take 1 tab daily    . oxyCODONE-acetaminophen (PERCOCET) 10-325 MG per tablet Take 1 tablet by mouth 3 (three) times daily. Take 1 tab three times a day every day per patient  0  . potassium chloride SA (K-DUR,KLOR-CON) 20 MEQ tablet Take 1 tablet (20 mEq total) by mouth daily. 15 tablet 0  . torsemide (DEMADEX) 20 MG tablet Take 3 tablets (60 mg total) by mouth daily. 90 tablet 6  . traZODone (DESYREL) 150 MG tablet Take 150 mg by mouth at bedtime.    Marland Kitchen guaiFENesin (MUCINEX) 600 MG 12 hr tablet Take 1 tablet (600 mg total) by mouth 2 (two) times daily. (Patient not taking: Reported on 01/13/2015) 40 tablet 0  . hydrALAZINE (APRESOLINE) 25 MG tablet Take 1 tablet (25 mg total) by mouth 3 (three) times daily. (Patient not taking: Reported on 01/13/2015) 90 tablet 5  . isosorbide dinitrate (ISORDIL) 20 MG tablet Take 1 tablet (20 mg total) by mouth 3 (three) times daily. (Patient not taking: Reported on 01/13/2015) 90 tablet 5   No current  facility-administered medications for this visit.    Functional Status:  In your present state of health, do you have any difficulty performing the following activities: 12/27/2014 12/21/2014  Hearing? N N  Vision? N N  Difficulty concentrating or making decisions? N N  Walking or climbing stairs? N N  Dressing or bathing? N N  Doing errands, shopping? N N  Preparing Food and eating ? N N  Using the Toilet? N N  In the past six months, have you accidently leaked urine? N N  Do you have problems with loss of bowel control? N N  Managing your Medications? N N  Managing your Finances? N N  Housekeeping or managing your Housekeeping? N N    Fall/Depression Screening: PHQ 2/9 Scores 12/27/2014 12/27/2014 12/21/2014  PHQ - 2 Score 0 0 0    Assessment: 69 year old with history of heart failure. Member denies any issues, denies shortness of breath. Member is still weighing himself, but states he no longer records his weights. RNCM reinforced the importance of recording weight. Member is attending follow up appointments and is also being followed by the paramedicine. Heart failure zone tool/action plan reinforced. Member asked about how to get a cane-RNCM discussed resources. Member reported that he would be able to get a cane. RNCM discussed  transitioning to the health coach for continued disease management-Member declines. No further care management needs identified.  Plan: Close case, send letter to primary care. Update paramedicine.  [name], RN, MSN, [place] Coordinator Cell: [phone]

## 2015-01-14 ENCOUNTER — Other Ambulatory Visit: Payer: Self-pay

## 2015-01-14 NOTE — Patient Outreach (Signed)
[facility]) Care Management  01/14/2015  [name] 1945-08-09 [id number]   Care Coordination: RNCM call to paramedicine to discuss case management case closure. Left voice message for Marylouise Stacks requesting return call.  Plan: await return call. Close case.  [name], RN, MSN, [place] Coordinator Cell: [phone]
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
## 2015-01-18 ENCOUNTER — Encounter: Payer: Self-pay | Admitting: Cardiology

## 2015-01-18 ENCOUNTER — Telehealth (HOSPITAL_COMMUNITY): Payer: Self-pay | Admitting: *Deleted

## 2015-01-18 ENCOUNTER — Ambulatory Visit (INDEPENDENT_AMBULATORY_CARE_PROVIDER_SITE_OTHER): Payer: Medicare Other | Admitting: Cardiology

## 2015-01-18 VITALS — BP 132/60 | HR 66 | Ht 68.0 in | Wt 205.6 lb

## 2015-01-18 DIAGNOSIS — I255 Ischemic cardiomyopathy: Secondary | ICD-10-CM | POA: Diagnosis not present

## 2015-01-18 DIAGNOSIS — I5043 Acute on chronic combined systolic (congestive) and diastolic (congestive) heart failure: Secondary | ICD-10-CM

## 2015-01-18 DIAGNOSIS — Z01812 Encounter for preprocedural laboratory examination: Secondary | ICD-10-CM

## 2015-01-18 LAB — CUP PACEART INCLINIC DEVICE CHECK
Brady Statistic AP VP Percent: 0.1 % — CL
Brady Statistic AS VS Percent: 88 %
HIGH POWER IMPEDANCE MEASURED VALUE: 62 Ohm
Implantable Lead Implant Date: 20140207
Implantable Lead Location: 753859
Implantable Lead Location: 753860
Lead Channel Impedance Value: 399 Ohm
Lead Channel Impedance Value: 418 Ohm
Lead Channel Pacing Threshold Pulse Width: 0.4 ms
Lead Channel Pacing Threshold Pulse Width: 0.4 ms
Lead Channel Sensing Intrinsic Amplitude: 17.6 mV
Lead Channel Setting Pacing Amplitude: 1.5 V
Lead Channel Setting Sensing Sensitivity: 0.3 mV
MDC IDC LEAD IMPLANT DT: 20140207
MDC IDC LEAD MODEL: 6935
MDC IDC MSMT BATTERY REMAINING LONGEVITY: 96 mo
MDC IDC MSMT LEADCHNL RA PACING THRESHOLD AMPLITUDE: 0.5 V
MDC IDC MSMT LEADCHNL RA SENSING INTR AMPL: 1.9 mV
MDC IDC MSMT LEADCHNL RV PACING THRESHOLD AMPLITUDE: 0.75 V
MDC IDC SESS DTM: 20161108173203
MDC IDC SET LEADCHNL RV PACING AMPLITUDE: 2 V
MDC IDC SET LEADCHNL RV PACING PULSEWIDTH: 0.4 ms
MDC IDC STAT BRADY AP VS PERCENT: 11.9 %
MDC IDC STAT BRADY AS VP PERCENT: 0.1 % — AB

## 2015-01-18 MED ORDER — TORSEMIDE 20 MG PO TABS: ORAL_TABLET | ORAL | Status: DC

## 2015-01-18 NOTE — Telephone Encounter (Signed)
Katie w/Paramedicine is out seeing pt and called so we could verify all medications.  Pt is not taking Furosemide and is taking Torsemide 80 mg every other day ALTERNATING with 60 mg every other day and he states he started that dose last week after a phone call telling him to do so.  Per Dr Claris Gladden note on pt's labs from 11/2 these were his instructions.  Katie reports pt is stable wt today was 205 lb, he usually runs 199-205, no edema, no SOB and lungs are CTA.  She does report pt's BP is elevated at 150/70 HR 68.  She reports she fills pt's pill box weekly and he has taken all meds.  Scheduled pt for f/u on Mon 11/4 w/Dr Aundra Dubin

## 2015-01-18 NOTE — Patient Outreach (Signed)
[facility]) Care Management  01/18/2015  [name] [birth year] [id number]   Notification from [name], RN to close case due to goals met with [place] Management.  Thanks, Ronnell Freshwater. [place], [place] Assistant Phone: [phone] Fax: [phone]

## 2015-01-18 NOTE — Patient Instructions (Addendum)
Medication Instructions:  Your physician recommends that you continue on your current medications as directed. Please refer to the Current Medication list given to you today.  Labwork: None ordered  Testing/Procedures: CRT or cardiac resynchronization therapy is a treatment used to correct heart failure. When you have heart failure your heart is weakened and doesn't pump as well as it should. This therapy may help reduce symptoms and improve the quality of life.  Please see the handout/brochure given to you today to get more information of the different options of therapy.  Sherri, RN will call you to arrange this CRT-D upgrade.   Follow-Up: To be determined once procedure is scheduled.  Any Other Special Instructions Will Be Listed Below (If Applicable).  If you need a refill on your cardiac medications before your next appointment, please call your pharmacy.  Thank you for choosing [facility]!!   Trinidad Curet, RN [phone]

## 2015-01-20 ENCOUNTER — Telehealth: Payer: Self-pay | Admitting: *Deleted

## 2015-01-20 NOTE — Telephone Encounter (Signed)
Informed patient that I would be calling him next week to arrange CRT-D procedure. He verbalized understand and agreeable to plan.

## 2015-01-20 NOTE — Addendum Note (Signed)
Addended by: Freada Bergeron on: 01/20/2015 02:29 PM   Modules accepted: Orders

## 2015-01-24 ENCOUNTER — Encounter (HOSPITAL_COMMUNITY): Payer: Self-pay

## 2015-01-24 ENCOUNTER — Ambulatory Visit (HOSPITAL_COMMUNITY)
Admission: RE | Admit: 2015-01-24 | Discharge: 2015-01-24 | Disposition: A | Discharge status: Home / Self Care | Payer: Medicare Other | Source: Ambulatory Visit | Attending: Cardiology | Admitting: Cardiology

## 2015-01-24 VITALS — BP 128/72 | HR 70 | Wt 204.5 lb

## 2015-01-24 DIAGNOSIS — Z7984 Long term (current) use of oral hypoglycemic drugs: Secondary | ICD-10-CM | POA: Insufficient documentation

## 2015-01-24 DIAGNOSIS — Z8249 Family history of ischemic heart disease and other diseases of the circulatory system: Secondary | ICD-10-CM | POA: Diagnosis not present

## 2015-01-24 DIAGNOSIS — E1122 Type 2 diabetes mellitus with diabetic chronic kidney disease: Secondary | ICD-10-CM | POA: Insufficient documentation

## 2015-01-24 DIAGNOSIS — I255 Ischemic cardiomyopathy: Secondary | ICD-10-CM | POA: Insufficient documentation

## 2015-01-24 DIAGNOSIS — Z833 Family history of diabetes mellitus: Secondary | ICD-10-CM | POA: Diagnosis not present

## 2015-01-24 DIAGNOSIS — Z87891 Personal history of nicotine dependence: Secondary | ICD-10-CM | POA: Diagnosis not present

## 2015-01-24 DIAGNOSIS — Z95810 Presence of automatic (implantable) cardiac defibrillator: Secondary | ICD-10-CM | POA: Insufficient documentation

## 2015-01-24 DIAGNOSIS — N184 Chronic kidney disease, stage 4 (severe): Secondary | ICD-10-CM | POA: Insufficient documentation

## 2015-01-24 DIAGNOSIS — Z79899 Other long term (current) drug therapy: Secondary | ICD-10-CM | POA: Diagnosis not present

## 2015-01-24 DIAGNOSIS — Z7902 Long term (current) use of antithrombotics/antiplatelets: Secondary | ICD-10-CM | POA: Diagnosis not present

## 2015-01-24 DIAGNOSIS — I5043 Acute on chronic combined systolic (congestive) and diastolic (congestive) heart failure: Secondary | ICD-10-CM

## 2015-01-24 DIAGNOSIS — I2510 Atherosclerotic heart disease of native coronary artery without angina pectoris: Secondary | ICD-10-CM | POA: Insufficient documentation

## 2015-01-24 DIAGNOSIS — I5042 Chronic combined systolic (congestive) and diastolic (congestive) heart failure: Secondary | ICD-10-CM | POA: Insufficient documentation

## 2015-01-24 DIAGNOSIS — Z951 Presence of aortocoronary bypass graft: Secondary | ICD-10-CM | POA: Insufficient documentation

## 2015-01-24 DIAGNOSIS — Z7982 Long term (current) use of aspirin: Secondary | ICD-10-CM | POA: Insufficient documentation

## 2015-01-24 DIAGNOSIS — J449 Chronic obstructive pulmonary disease, unspecified: Secondary | ICD-10-CM | POA: Diagnosis not present

## 2015-01-24 DIAGNOSIS — I130 Hypertensive heart and chronic kidney disease with heart failure and stage 1 through stage 4 chronic kidney disease, or unspecified chronic kidney disease: Secondary | ICD-10-CM | POA: Diagnosis not present

## 2015-01-24 DIAGNOSIS — E785 Hyperlipidemia, unspecified: Secondary | ICD-10-CM | POA: Insufficient documentation

## 2015-01-24 LAB — LIPID PANEL
Cholesterol: 86 mg/dL (ref 0–200)
HDL: 39 mg/dL — ABNORMAL LOW (ref >40)
LDL CALC: 34 mg/dL (ref 0–99)
TRIGLYCERIDES: 64 mg/dL (ref <150)
Total CHOL/HDL Ratio: 2.2 RATIO
VLDL: 13 mg/dL (ref 0–40)

## 2015-01-24 LAB — BASIC METABOLIC PANEL
ANION GAP: 10 (ref 5–15)
BUN: 55 mg/dL — ABNORMAL HIGH (ref 6–20)
CALCIUM: 9.9 mg/dL (ref 8.9–10.3)
CO2: 23 mmol/L (ref 22–32)
Chloride: 108 mmol/L (ref 101–111)
Creatinine, Ser: 2.97 mg/dL — ABNORMAL HIGH (ref 0.61–1.24)
GFR, EST AFRICAN AMERICAN: 23 mL/min — AB (ref >60)
GFR, EST NON AFRICAN AMERICAN: 20 mL/min — AB (ref >60)
GLUCOSE: 62 mg/dL — AB (ref 65–99)
Potassium: 4.5 mmol/L (ref 3.5–5.1)
Sodium: 141 mmol/L (ref 135–145)

## 2015-01-24 LAB — BRAIN NATRIURETIC PEPTIDE: B Natriuretic Peptide: 286.9 pg/mL — ABNORMAL HIGH (ref 0.0–100.0)

## 2015-01-24 MED ORDER — HYDRALAZINE HCL 25 MG PO TABS: 37.5000 mg | ORAL_TABLET | Freq: Three times a day (TID) | ORAL | Status: DC

## 2015-01-24 NOTE — Patient Instructions (Signed)
Medications:  Increase Hydralazine 37.5 mg (1 1/2 tablets) three times a day  Labs today will call if abnormal   Follow up 6 weeks with Dr Aundra Dubin

## 2015-01-25 DIAGNOSIS — N184 Chronic kidney disease, stage 4 (severe): Secondary | ICD-10-CM | POA: Insufficient documentation

## 2015-01-25 LAB — CUP PACEART INCLINIC DEVICE CHECK
Battery Remaining Longevity: 101 mo
Brady Statistic AP VS Percent: 20.56 %
Brady Statistic AS VS Percent: 79.38 %
Brady Statistic RV Percent Paced: 0.06 %
HIGH POWER IMPEDANCE MEASURED VALUE: 54 Ohm
Implantable Lead Implant Date: 20140207
Implantable Lead Location: 753860
Implantable Lead Model: 6935
Lead Channel Impedance Value: 342 Ohm
Lead Channel Pacing Threshold Amplitude: 0.5 V
Lead Channel Pacing Threshold Amplitude: 0.625 V
Lead Channel Sensing Intrinsic Amplitude: 1.875 mV
Lead Channel Setting Pacing Amplitude: 2 V
Lead Channel Setting Sensing Sensitivity: 0.3 mV
MDC IDC LEAD IMPLANT DT: 20140207
MDC IDC LEAD LOCATION: 753859
MDC IDC MSMT BATTERY VOLTAGE: 3.01 V
MDC IDC MSMT LEADCHNL RA IMPEDANCE VALUE: 361 Ohm
MDC IDC MSMT LEADCHNL RA PACING THRESHOLD PULSEWIDTH: 0.4 ms
MDC IDC MSMT LEADCHNL RA SENSING INTR AMPL: 1.875 mV
MDC IDC MSMT LEADCHNL RV IMPEDANCE VALUE: 399 Ohm
MDC IDC MSMT LEADCHNL RV PACING THRESHOLD PULSEWIDTH: 0.4 ms
MDC IDC MSMT LEADCHNL RV SENSING INTR AMPL: 18 mV
MDC IDC MSMT LEADCHNL RV SENSING INTR AMPL: 18 mV
MDC IDC SESS DTM: 20161024180548
MDC IDC SET LEADCHNL RA PACING AMPLITUDE: 1.5 V
MDC IDC SET LEADCHNL RV PACING PULSEWIDTH: 0.4 ms
MDC IDC STAT BRADY AP VP PERCENT: 0.03 %
MDC IDC STAT BRADY AS VP PERCENT: 0.03 %
MDC IDC STAT BRADY RA PERCENT PACED: 20.59 %

## 2015-01-25 NOTE — Progress Notes (Signed)
Patient ID: [name], male   DOB: Mar 02, 1946, 69 y.o.   MRN: [id number] PCP:Dr [facility] Cardiology: Dr. Sallyanne Kuster HF Cardiology: Dr Aundra Dubin  HPI: [name] is a 69 y.o. with Chronic combined CHF 30-35% s/p Medtronic ICD placement 2/14, CAD s/p CABG x 3, COPD, DM, HTN, LBB, Carotid artery disease s/p stent 8/15 on DAPT, and CKD stage 3-4.   Admitted to [facility] 9/12 through 11/26/14. Diuresed with lasix drip and later transitioned to torsemide 60 mg daily. No ACEI, spironolactone, or digoxin with CKD. Discharge weight was 195 pounds.  Echo in 9/16 with EF 30-35%.   He returns for followup.  Seems to be doing well.  No dyspnea walking on flat ground and can get up a flight of steps without problems. No chest pain.  No lightheadedness/syncope.  No palpitations.  No orthopnea/PND.  He is not smoking.  Creatinine remains elevated, has seen nephrology.   Optivol: Fluid index < threshold, stable impedance.   Labs (11/16): K 4.8, creatinine 3.62  ROS: All systems negative except as listed in HPI, PMH and Problem List.  PMH: 1. CAD: s/p CABG with LIMA-LAD, SVG-LCx, SVG-PDA.  Cardiolite 6/15 with no ischemia.  2. Chronic systolic CHF: Ischemic CMP.  Medtronic ICD.  Echo (9/16) with EF 30-35%, regional WMAs, mild MR, mildly decreased RV systolic function.  3. COPD 4. LBBB 5. Carotid stenosis: Carotid stent in 8/15.  6. CKD 7. Type II diabetes 8. HTN 9. Hyperlipidemia  SH:  Social History   Social History  . Marital Status: Divorced    Spouse Name: N/A  . Number of Children: N/A  . Years of Education: N/A   Occupational History  . Not on file.   Social History Main Topics  . Smoking status: Former Smoker    Quit date: 04/18/1982  . Smokeless tobacco: Never Used  . Alcohol Use: No  . Drug Use: 7.00 per week    Special: Marijuana  . Sexual Activity: Not on file   Other Topics Concern  . Not on file   Social History Narrative    FH:  Family History  Problem Relation Age of  Onset  . Cancer Mother   . Hypertension Mother   . Cancer Sister   . Diabetes Brother     Current Outpatient Prescriptions  Medication Sig Dispense Refill  . amLODipine (NORVASC) 10 MG tablet Take 10 mg by mouth daily.    Marland Kitchen aspirin EC 81 MG EC tablet Take 1 tablet (81 mg total) by mouth daily. 30 tablet 0  . atorvastatin (LIPITOR) 40 MG tablet Take 40 mg by mouth daily.     . carvedilol (COREG) 6.25 MG tablet Take 6.25 mg by mouth 2 (two) times daily.     . clopidogrel (PLAVIX) 75 MG tablet Take 75 mg by mouth daily.    Marland Kitchen glimepiride (AMARYL) 1 MG tablet Take 1 tablet by mouth daily.     Marland Kitchen guaiFENesin (MUCINEX) 600 MG 12 hr tablet Take 1 tablet (600 mg total) by mouth 2 (two) times daily. 40 tablet 0  . isosorbide dinitrate (ISORDIL) 20 MG tablet Take 1 tablet (20 mg total) by mouth 3 (three) times daily. 90 tablet 5  . oxyCODONE-acetaminophen (PERCOCET) 10-325 MG per tablet Take 1 tablet by mouth 3 (three) times daily.   0  . potassium chloride SA (K-DUR,KLOR-CON) 20 MEQ tablet Take 1 tablet (20 mEq total) by mouth daily. 15 tablet 0  . torsemide (DEMADEX) 20 MG tablet Take 4 tabs every other  day ALTERNATING with 3 tabs every other day 120 tablet 6  . traZODone (DESYREL) 150 MG tablet Take 150 mg by mouth at bedtime.    . hydrALAZINE (APRESOLINE) 25 MG tablet Take 1.5 tablets (37.5 mg total) by mouth 3 (three) times daily. 270 tablet 3   No current facility-administered medications for this encounter.    Filed Vitals:   01/24/15 1409  BP: 128/72  Pulse: 70  Weight: 204 lb 8 oz (92.761 kg)  SpO2: 98%    PHYSICAL EXAM:  General:  Well appearing. No resp difficulty. Ambulated in the clinic without difficulty.  HEENT: normal Neck: supple. JVP flat. Carotids 2+ bilaterally; no bruits. No lymphadenopathy or thryomegaly appreciated. Cor: PMI normal. Regular rate & rhythm. No rubs, gallops or murmurs. Lungs: clear Abdomen: soft, nontender, nondistended. No hepatosplenomegaly. No  bruits or masses. Good bowel sounds. Extremities: no cyanosis, clubbing, rash, edema Neuro: alert & orientedx3, cranial nerves grossly intact. Moves all 4 extremities w/o difficulty. Affect pleasant.  ASSESSMENT & PLAN: 1. Chronic combined CHF:  Echo 11/23/2014 with LVEF 30-35%, Grade 2 DD, PA peak pressure 53, RV mildly reduced. Ischemic Cardiomyopathy. S/p Medtronic ICD 2/14. NYHA class II.  He is taking his medications as prescribed.  Volume status looks ok today. - Continue carvedilol 6.25 mg twice a day. No dig with CKD.  - Increase hydralazine to 37.5 mg tid and continue isordil 20 mg tid.  - No ACEI or spironolactone with CKD stage IV.  - ECG with IVCD 168 msec, he has been seen by EP with plan for CRT upgrade. - Continue torsemide 80 mg daily alternating with 60 mg daily.  BMET/BNP today.  2. CAD: s/p CABG 2006 with a LIMA to LAD, vein to the circumflex and PDA. Cardiolite performed 09/02/13 showed no ischemia. Denies CP.  - Continue ASA, Plavix, Statin 3. CKD stage 4: Check BMET today. He is followed by nephrology.   4. Hyperlipidemia: Continue statin 5. Hypertension: BP controlled on current meds.  May need to back off on amlodipine as we titrate up Coreg and hydralazine/isordil.    6. Carotid artery disease: Stent placed 8/15. On ASA and plavix - [place] 3/16 showed widely patent carotids bilaterally.   Loralie Champagne  01/25/2015 12:43 PM

## 2015-01-31 ENCOUNTER — Telehealth: Payer: Self-pay | Admitting: *Deleted

## 2015-01-31 DIAGNOSIS — H40012 Open angle with borderline findings, low risk, left eye: Secondary | ICD-10-CM | POA: Diagnosis not present

## 2015-01-31 DIAGNOSIS — H04123 Dry eye syndrome of bilateral lacrimal glands: Secondary | ICD-10-CM | POA: Diagnosis not present

## 2015-01-31 DIAGNOSIS — H25813 Combined forms of age-related cataract, bilateral: Secondary | ICD-10-CM | POA: Diagnosis not present

## 2015-01-31 NOTE — Telephone Encounter (Signed)
Called patient to review procedure time/instructions.  Patient would like to reschedule procedure as something has come up and he cannot come that day. Informed patient that I would look for another date and call him back. (will have to investigate hospital availability as this procedure will need to be proctored) Patient verbalized understanding and agreeable to plan.

## 2015-02-07 ENCOUNTER — Other Ambulatory Visit (HOSPITAL_COMMUNITY): Payer: Self-pay | Admitting: Pharmacist

## 2015-02-10 ENCOUNTER — Ambulatory Visit (HOSPITAL_COMMUNITY): Admit: 2015-02-10 | Payer: Medicare Other | Admitting: Internal Medicine

## 2015-02-10 ENCOUNTER — Encounter (HOSPITAL_COMMUNITY): Payer: Self-pay

## 2015-02-10 SURGERY — BIV ICD INSERTION CRT-D
Anesthesia: LOCAL

## 2015-02-11 ENCOUNTER — Encounter (HOSPITAL_COMMUNITY): Payer: Medicare Other

## 2015-02-11 NOTE — Telephone Encounter (Signed)
Patient would like to go on 03/17/15. He would like me to call him next week to review details/instructions.

## 2015-02-14 ENCOUNTER — Encounter: Payer: Self-pay | Admitting: Cardiology

## 2015-02-15 ENCOUNTER — Telehealth (HOSPITAL_COMMUNITY): Payer: Self-pay | Admitting: *Deleted

## 2015-02-15 MED ORDER — ISOSORB DINITRATE-HYDRALAZINE 20-37.5 MG PO TABS: 1.0000 | ORAL_TABLET | Freq: Three times a day (TID) | ORAL | Status: DC

## 2015-02-15 NOTE — Telephone Encounter (Signed)
Katie w/Paramedicine called to let us know that pt's pharmacy had gotten Bidil back in stock, they filled Bidil, Hydralazine, Isosorbide and Lasix for pt and he took all meds except Lasix yesterday and this AM.  She states she is taking away the Hydralazine, Isosorbide and Lasix.  Have called [facility] and gave them VO to d/c order for these meds.

## 2015-02-16 DIAGNOSIS — H268 Other specified cataract: Secondary | ICD-10-CM | POA: Diagnosis not present

## 2015-02-16 DIAGNOSIS — H2512 Age-related nuclear cataract, left eye: Secondary | ICD-10-CM | POA: Diagnosis not present

## 2015-03-01 ENCOUNTER — Encounter: Payer: Self-pay | Admitting: *Deleted

## 2015-03-01 NOTE — Telephone Encounter (Signed)
Reviewed procedure details/instructions with patient. Letter of instructions mailed to home address. Pre procedure labs to be done in hospital morning of the procedure. Wound check on 03/28/15. Patient verbalized understanding and agreeable to plan.

## 2015-03-10 ENCOUNTER — Other Ambulatory Visit: Payer: Self-pay

## 2015-03-10 ENCOUNTER — Ambulatory Visit (HOSPITAL_COMMUNITY)
Admission: RE | Admit: 2015-03-10 | Discharge: 2015-03-10 | Disposition: A | Discharge status: Home / Self Care | Payer: Medicare Other | Source: Ambulatory Visit | Attending: Cardiology | Admitting: Cardiology

## 2015-03-10 VITALS — BP 120/62 | HR 69 | Wt 207.0 lb

## 2015-03-10 DIAGNOSIS — I5042 Chronic combined systolic (congestive) and diastolic (congestive) heart failure: Secondary | ICD-10-CM | POA: Insufficient documentation

## 2015-03-10 DIAGNOSIS — Z951 Presence of aortocoronary bypass graft: Secondary | ICD-10-CM | POA: Insufficient documentation

## 2015-03-10 DIAGNOSIS — N184 Chronic kidney disease, stage 4 (severe): Secondary | ICD-10-CM | POA: Diagnosis not present

## 2015-03-10 DIAGNOSIS — E785 Hyperlipidemia, unspecified: Secondary | ICD-10-CM | POA: Diagnosis not present

## 2015-03-10 DIAGNOSIS — I130 Hypertensive heart and chronic kidney disease with heart failure and stage 1 through stage 4 chronic kidney disease, or unspecified chronic kidney disease: Secondary | ICD-10-CM | POA: Diagnosis not present

## 2015-03-10 DIAGNOSIS — I2510 Atherosclerotic heart disease of native coronary artery without angina pectoris: Secondary | ICD-10-CM | POA: Diagnosis not present

## 2015-03-10 LAB — BASIC METABOLIC PANEL
ANION GAP: 12 (ref 5–15)
BUN: 58 mg/dL — ABNORMAL HIGH (ref 6–20)
CALCIUM: 9.7 mg/dL (ref 8.9–10.3)
CO2: 23 mmol/L (ref 22–32)
Chloride: 107 mmol/L (ref 101–111)
Creatinine, Ser: 3.29 mg/dL — ABNORMAL HIGH (ref 0.61–1.24)
GFR, EST AFRICAN AMERICAN: 21 mL/min — AB (ref >60)
GFR, EST NON AFRICAN AMERICAN: 18 mL/min — AB (ref >60)
GLUCOSE: 72 mg/dL (ref 65–99)
Potassium: 4.6 mmol/L (ref 3.5–5.1)
Sodium: 142 mmol/L (ref 135–145)

## 2015-03-10 MED ORDER — CARVEDILOL 12.5 MG PO TABS: 12.5000 mg | ORAL_TABLET | Freq: Two times a day (BID) | ORAL | Status: DC

## 2015-03-10 NOTE — Progress Notes (Signed)
Advanced Heart Failure Medication Review by a Pharmacist  Does the patient  feel that his/her medications are working for him/her?  yes  Has the patient been experiencing any side effects to the medications prescribed?  no  Does the patient measure his/her own blood pressure or blood glucose at home?  yes   Does the patient have any problems obtaining medications due to transportation or finances?   no  Understanding of regimen: good Understanding of indications: good Potential of compliance: good Patient understands to avoid NSAIDs. Patient understands to avoid decongestants.  Issues to address at subsequent visits: None   Pharmacist comments:  [name] is a pleasant 69 yo M presenting without a medication list but with good recall of his regimen including most dosages. He reports excellent compliance with his regimen and did not have any specific medication-related questions or concerns for me at this time.   Ruta Hinds. Velva Harman, PharmD, BCPS, CPP Clinical Pharmacist Pager: [phone] Phone: [phone] 03/10/2015 11:49 AM      Time with patient: 6 minutes Preparation and documentation time: 2 minutes Total time: 8 minutes

## 2015-03-10 NOTE — Patient Instructions (Addendum)
INCREASE Carvedilol to 12.5 mg twice a day.  Routine lab work today. (bmet) Will notify you of abnormal results  FOLLOW UP: 2 months

## 2015-03-11 NOTE — Progress Notes (Signed)
Patient ID: [name], male   DOB: 21-Jun-1945, 69 y.o.   MRN: [id number] PCP: Dr Lysle Rubens Nephrology: Dr. Florene Glen Cardiology: Dr. Sallyanne Kuster HF Cardiology: Dr Aundra Dubin  HPI: [name] is a 69 y.o. with Chronic combined CHF 30-35% s/p Medtronic ICD placement 2/14, CAD s/p CABG x 3, COPD, DM, HTN, LBB, Carotid artery disease s/p stent 8/15 on DAPT, and CKD stage 3-4.   Admitted to [facility] 9/12 through 11/26/14. Diuresed with lasix drip and later transitioned to torsemide 60 mg daily. No ACEI, spironolactone, or digoxin with CKD. Discharge weight was 195 pounds.  Echo in 9/16 with EF 30-35%.   He returns for followup.  Seems to be doing well.  He can get up a flight of steps without problems. Dyspnea after walking about 300-400 feet.  No chest pain.  No lightheadedness/syncope.  No palpitations.  No orthopnea/PND.  He is not smoking.  Stable creatinine, sees Dr Florene Glen.  CRT upgrade planned for 03/17/15.   Optivol: Fluid index < threshold, stable impedance.   ECG: NSR, LBBB-like IVCD with QRS 160 msec  Labs (11/16): K 4.8 => 4.5, creatinine 3.62 => 2.97, BNP 287, LDL 34, HDL 39  ROS: All systems negative except as listed in HPI, PMH and Problem List.  PMH: 1. CAD: s/p CABG with LIMA-LAD, SVG-LCx, SVG-PDA.  Cardiolite 6/15 with no ischemia.  2. Chronic systolic CHF: Ischemic CMP.  Medtronic ICD.  Echo (9/16) with EF 30-35%, regional WMAs, mild MR, mildly decreased RV systolic function.  3. COPD 4. LBBB 5. Carotid stenosis: Carotid stent in 8/15.  6. CKD 7. Type II diabetes 8. HTN 9. Hyperlipidemia  SH:  Social History   Social History  . Marital Status: Divorced    Spouse Name: N/A  . Number of Children: N/A  . Years of Education: N/A   Occupational History  . Not on file.   Social History Main Topics  . Smoking status: Former Smoker    Quit date: 04/18/1982  . Smokeless tobacco: Never Used  . Alcohol Use: No  . Drug Use: 7.00 per week    Special: Marijuana  . Sexual Activity:  Not on file   Other Topics Concern  . Not on file   Social History Narrative    FH:  Family History  Problem Relation Age of Onset  . Cancer Mother   . Hypertension Mother   . Cancer Sister   . Diabetes Brother     Current Outpatient Prescriptions  Medication Sig Dispense Refill  . amLODipine (NORVASC) 10 MG tablet Take 10 mg by mouth every morning.     Marland Kitchen aspirin 81 MG tablet Take 81 mg by mouth daily.    Marland Kitchen atorvastatin (LIPITOR) 40 MG tablet Take 40 mg by mouth every morning.     . carvedilol (COREG) 12.5 MG tablet Take 1 tablet (12.5 mg total) by mouth 2 (two) times daily. 60 tablet 3  . clopidogrel (PLAVIX) 75 MG tablet Take 75 mg by mouth daily.    Marland Kitchen glimepiride (AMARYL) 1 MG tablet Take 1 tablet by mouth daily. Reported on 03/10/2015    . oxyCODONE-acetaminophen (PERCOCET) 10-325 MG per tablet Take 1 tablet by mouth every 8 (eight) hours as needed for pain.   0  . potassium chloride SA (K-DUR,KLOR-CON) 20 MEQ tablet Take 1 tablet (20 mEq total) by mouth daily. 15 tablet 0  . prednisoLONE acetate (PRED FORTE) 1 % ophthalmic suspension Place 1 drop into both eyes 2 (two) times daily.    Marland Kitchen  torsemide (DEMADEX) 20 MG tablet Take 4 tabs every other day ALTERNATING with 3 tabs every other day 120 tablet 6  . traZODone (DESYREL) 150 MG tablet Take 150 mg by mouth at bedtime.    Marland Kitchen dextromethorphan-guaiFENesin (MUCINEX DM) 30-600 MG 12hr tablet Take 1 tablet by mouth daily.    . isosorbide-hydrALAZINE (BIDIL) 20-37.5 MG tablet Take 1 tablet by mouth 3 (three) times daily. (Patient not taking: Reported on 03/10/2015)     No current facility-administered medications for this encounter.    Filed Vitals:   03/10/15 1136  BP: 120/62  Pulse: 69  Weight: 207 lb (93.895 kg)  SpO2: 94%    PHYSICAL EXAM:  General:  Well appearing. No resp difficulty. Ambulated in the clinic without difficulty.  HEENT: normal Neck: supple. JVP flat. Carotids 2+ bilaterally; no bruits. No  lymphadenopathy or thryomegaly appreciated. Cor: PMI normal. Regular rate & rhythm. No rubs, gallops or murmurs. Lungs: clear Abdomen: soft, nontender, nondistended. No hepatosplenomegaly. No bruits or masses. Good bowel sounds. Extremities: no cyanosis, clubbing, rash.  Trace ankle edema Neuro: alert & orientedx3, cranial nerves grossly intact. Moves all 4 extremities w/o difficulty. Affect pleasant.  ASSESSMENT & PLAN: 1. Chronic combined CHF:  Echo 11/23/2014 with LVEF 30-35%, Grade 2 DD, PA peak pressure 53, RV systolic function mildly reduced. Ischemic Cardiomyopathy. S/p Medtronic ICD 2/14. NYHA class II.  He is taking his medications as prescribed.  Volume status looks ok today by exam and by Optivol. - Increase Coreg to 12.5 mg bid.  - Continue Bidil 1 tab tid, plan to increase at next appointment.  - No ACEI or spironolactone with CKD stage IV.  - ECG with LBBB-like IVCD 160 msec, he has been seen by EP with plan for CRT upgrade on 03/17/15. - Continue torsemide 80 mg daily alternating with 60 mg daily.  BMET.   2. CAD: s/p CABG 2006 with a LIMA to LAD, veins to the circumflex and PDA. Cardiolite performed 09/02/13 showed no ischemia. Denies CP.  - Continue ASA, Plavix, Statin 3. CKD stage 4: Check BMET today. He is followed by nephrology.   4. Hyperlipidemia: Continue statin 5. Hypertension: BP controlled on current meds.  May need to back off on amlodipine as we titrate up Coreg and Bidil.    6. Carotid artery disease: Stent placed 8/15. On ASA and plavix - [place] 3/16 showed widely patent carotids bilaterally.   Followup in 2 months.   Loralie Champagne  03/11/2015 12:09 AM

## 2015-03-17 ENCOUNTER — Ambulatory Visit (HOSPITAL_COMMUNITY)
Admission: RE | Admit: 2015-03-17 | Discharge: 2015-03-18 | Disposition: A | Discharge status: Home / Self Care | Payer: Medicare Other | Source: Ambulatory Visit | Attending: Internal Medicine | Admitting: Internal Medicine

## 2015-03-17 ENCOUNTER — Encounter (HOSPITAL_COMMUNITY): Payer: Self-pay

## 2015-03-17 ENCOUNTER — Encounter (HOSPITAL_COMMUNITY)
Admission: RE | Disposition: A | Discharge status: Home / Self Care | Payer: Medicare Other | Source: Ambulatory Visit | Attending: Internal Medicine

## 2015-03-17 DIAGNOSIS — J449 Chronic obstructive pulmonary disease, unspecified: Secondary | ICD-10-CM | POA: Diagnosis not present

## 2015-03-17 DIAGNOSIS — I447 Left bundle-branch block, unspecified: Secondary | ICD-10-CM | POA: Diagnosis not present

## 2015-03-17 DIAGNOSIS — I2510 Atherosclerotic heart disease of native coronary artery without angina pectoris: Secondary | ICD-10-CM | POA: Insufficient documentation

## 2015-03-17 DIAGNOSIS — I252 Old myocardial infarction: Secondary | ICD-10-CM | POA: Insufficient documentation

## 2015-03-17 DIAGNOSIS — Z951 Presence of aortocoronary bypass graft: Secondary | ICD-10-CM | POA: Insufficient documentation

## 2015-03-17 DIAGNOSIS — Z95818 Presence of other cardiac implants and grafts: Secondary | ICD-10-CM

## 2015-03-17 DIAGNOSIS — I5022 Chronic systolic (congestive) heart failure: Secondary | ICD-10-CM | POA: Diagnosis not present

## 2015-03-17 DIAGNOSIS — I429 Cardiomyopathy, unspecified: Secondary | ICD-10-CM | POA: Diagnosis not present

## 2015-03-17 DIAGNOSIS — I739 Peripheral vascular disease, unspecified: Secondary | ICD-10-CM | POA: Diagnosis not present

## 2015-03-17 DIAGNOSIS — I5043 Acute on chronic combined systolic (congestive) and diastolic (congestive) heart failure: Secondary | ICD-10-CM

## 2015-03-17 DIAGNOSIS — E785 Hyperlipidemia, unspecified: Secondary | ICD-10-CM | POA: Insufficient documentation

## 2015-03-17 DIAGNOSIS — N184 Chronic kidney disease, stage 4 (severe): Secondary | ICD-10-CM | POA: Insufficient documentation

## 2015-03-17 DIAGNOSIS — E1122 Type 2 diabetes mellitus with diabetic chronic kidney disease: Secondary | ICD-10-CM | POA: Diagnosis not present

## 2015-03-17 DIAGNOSIS — I255 Ischemic cardiomyopathy: Secondary | ICD-10-CM

## 2015-03-17 DIAGNOSIS — I129 Hypertensive chronic kidney disease with stage 1 through stage 4 chronic kidney disease, or unspecified chronic kidney disease: Secondary | ICD-10-CM | POA: Insufficient documentation

## 2015-03-17 DIAGNOSIS — I509 Heart failure, unspecified: Secondary | ICD-10-CM

## 2015-03-17 DIAGNOSIS — Z87891 Personal history of nicotine dependence: Secondary | ICD-10-CM | POA: Insufficient documentation

## 2015-03-17 HISTORY — PX: EP IMPLANTABLE DEVICE: SHX172B

## 2015-03-17 LAB — GLUCOSE, CAPILLARY
GLUCOSE-CAPILLARY: 72 mg/dL (ref 65–99)
Glucose-Capillary: 64 mg/dL — ABNORMAL LOW (ref 65–99)
Glucose-Capillary: 126 mg/dL — ABNORMAL HIGH (ref 65–99)
Glucose-Capillary: 93 mg/dL (ref 65–99)

## 2015-03-17 LAB — CBC
HEMATOCRIT: 32.8 % — AB (ref 39.0–52.0)
Hemoglobin: 10.8 g/dL — ABNORMAL LOW (ref 13.0–17.0)
MCH: 30.8 pg (ref 26.0–34.0)
MCHC: 32.9 g/dL (ref 30.0–36.0)
MCV: 93.4 fL (ref 78.0–100.0)
PLATELETS: 140 10*3/uL — AB (ref 150–400)
RBC: 3.51 MIL/uL — ABNORMAL LOW (ref 4.22–5.81)
RDW: 13.6 % (ref 11.5–15.5)
WBC: 3.5 10*3/uL — ABNORMAL LOW (ref 4.0–10.5)

## 2015-03-17 LAB — SURGICAL PCR SCREEN
MRSA, PCR: NEGATIVE
STAPHYLOCOCCUS AUREUS: NEGATIVE

## 2015-03-17 SURGERY — BIV UPGRADE

## 2015-03-17 MED ORDER — LIDOCAINE HCL (PF) 1 % IJ SOLN
INTRAMUSCULAR | Status: DC | PRN
  Administered 2015-03-17: 46 mL

## 2015-03-17 MED ORDER — CEFAZOLIN SODIUM-DEXTROSE 2-3 GM-% IV SOLR
2.0000 g | INTRAVENOUS | Status: AC
  Administered 2015-03-17: 2 g via INTRAVENOUS

## 2015-03-17 MED ORDER — ATORVASTATIN CALCIUM 40 MG PO TABS
40.0000 mg | ORAL_TABLET | ORAL | Status: DC
  Filled 2015-03-17: qty 1

## 2015-03-17 MED ORDER — FENTANYL CITRATE (PF) 100 MCG/2ML IJ SOLN
INTRAMUSCULAR | Status: AC
  Filled 2015-03-17: qty 2

## 2015-03-17 MED ORDER — CEFAZOLIN SODIUM-DEXTROSE 2-3 GM-% IV SOLR
INTRAVENOUS | Status: AC
  Filled 2015-03-17: qty 50

## 2015-03-17 MED ORDER — DEXTROSE 50 % IV SOLN
INTRAVENOUS | Status: DC | PRN
  Administered 2015-03-17: 25 mL via INTRAVENOUS

## 2015-03-17 MED ORDER — MUPIROCIN 2 % EX OINT
TOPICAL_OINTMENT | CUTANEOUS | Status: AC
  Administered 2015-03-17: 1
  Filled 2015-03-17: qty 22

## 2015-03-17 MED ORDER — MIDAZOLAM HCL 5 MG/5ML IJ SOLN
INTRAMUSCULAR | Status: AC
  Filled 2015-03-17: qty 5

## 2015-03-17 MED ORDER — HEPARIN (PORCINE) IN NACL 2-0.9 UNIT/ML-% IJ SOLN
INTRAMUSCULAR | Status: DC | PRN
  Administered 2015-03-17: 500 mL

## 2015-03-17 MED ORDER — ASPIRIN EC 81 MG PO TBEC: 81.0000 mg | DELAYED_RELEASE_TABLET | Freq: Every day | ORAL | Status: DC

## 2015-03-17 MED ORDER — CEFAZOLIN SODIUM 1-5 GM-% IV SOLN
1.0000 g | Freq: Four times a day (QID) | INTRAVENOUS | Status: AC
  Administered 2015-03-17 – 2015-03-18 (×3): 1 g via INTRAVENOUS
  Filled 2015-03-17 (×3): qty 50

## 2015-03-17 MED ORDER — SODIUM CHLORIDE 0.9 % IV SOLN
INTRAVENOUS | Status: DC
  Administered 2015-03-17: 11:00:00 via INTRAVENOUS

## 2015-03-17 MED ORDER — IODIXANOL 320 MG/ML IV SOLN
INTRAVENOUS | Status: DC | PRN
  Administered 2015-03-17: 30 mL via INTRAVENOUS

## 2015-03-17 MED ORDER — DM-GUAIFENESIN ER 30-600 MG PO TB12: 1.0000 | ORAL_TABLET | Freq: Every day | ORAL | Status: DC

## 2015-03-17 MED ORDER — POTASSIUM CHLORIDE CRYS ER 20 MEQ PO TBCR
20.0000 meq | EXTENDED_RELEASE_TABLET | Freq: Every day | ORAL | Status: DC
Start: 2015-03-17 — End: 2015-03-18
  Administered 2015-03-17: 20 meq via ORAL
  Filled 2015-03-17: qty 1

## 2015-03-17 MED ORDER — FENTANYL CITRATE (PF) 100 MCG/2ML IJ SOLN
INTRAMUSCULAR | Status: DC | PRN
  Administered 2015-03-17 (×5): 25 ug via INTRAVENOUS

## 2015-03-17 MED ORDER — ACETAMINOPHEN 325 MG PO TABS: 325.0000 mg | ORAL_TABLET | ORAL | Status: DC | PRN

## 2015-03-17 MED ORDER — TRAZODONE HCL 50 MG PO TABS
150.0000 mg | ORAL_TABLET | Freq: Every day | ORAL | Status: DC
  Administered 2015-03-17: 150 mg via ORAL
  Filled 2015-03-17: qty 1

## 2015-03-17 MED ORDER — MIDAZOLAM HCL 5 MG/5ML IJ SOLN
INTRAMUSCULAR | Status: DC | PRN
  Administered 2015-03-17 (×4): 1 mg via INTRAVENOUS

## 2015-03-17 MED ORDER — GLIMEPIRIDE 1 MG PO TABS: 1.0000 mg | ORAL_TABLET | Freq: Every day | ORAL | Status: DC

## 2015-03-17 MED ORDER — ONDANSETRON HCL 4 MG/2ML IJ SOLN: 4.0000 mg | Freq: Four times a day (QID) | INTRAMUSCULAR | Status: DC | PRN

## 2015-03-17 MED ORDER — OXYCODONE HCL 5 MG PO TABS: 5.0000 mg | ORAL_TABLET | ORAL | Status: DC | PRN

## 2015-03-17 MED ORDER — ISOSORB DINITRATE-HYDRALAZINE 20-37.5 MG PO TABS
1.0000 | ORAL_TABLET | Freq: Three times a day (TID) | ORAL | Status: DC
  Administered 2015-03-17: 1 via ORAL
  Filled 2015-03-17: qty 1

## 2015-03-17 MED ORDER — SODIUM CHLORIDE 0.9 % IR SOLN
Status: AC
  Filled 2015-03-17: qty 2

## 2015-03-17 MED ORDER — CLOPIDOGREL BISULFATE 75 MG PO TABS: 75.0000 mg | ORAL_TABLET | Freq: Every day | ORAL | Status: DC

## 2015-03-17 MED ORDER — TORSEMIDE 20 MG PO TABS: 60.0000 mg | ORAL_TABLET | Freq: Every day | ORAL | Status: DC

## 2015-03-17 MED ORDER — DEXTROSE 50 % IV SOLN
INTRAVENOUS | Status: AC
  Filled 2015-03-17: qty 50

## 2015-03-17 MED ORDER — CARVEDILOL 12.5 MG PO TABS
12.5000 mg | ORAL_TABLET | Freq: Two times a day (BID) | ORAL | Status: DC
  Administered 2015-03-17: 12.5 mg via ORAL
  Filled 2015-03-17: qty 1

## 2015-03-17 MED ORDER — AMLODIPINE BESYLATE 10 MG PO TABS
10.0000 mg | ORAL_TABLET | ORAL | Status: DC
  Administered 2015-03-18: 10 mg via ORAL
  Filled 2015-03-17: qty 1

## 2015-03-17 MED ORDER — MUPIROCIN 2 % EX OINT: 1.0000 "application " | TOPICAL_OINTMENT | Freq: Once | CUTANEOUS | Status: DC

## 2015-03-17 MED ORDER — SODIUM CHLORIDE 0.9 % IR SOLN
80.0000 mg | Status: AC
  Administered 2015-03-17: 80 mg

## 2015-03-17 MED ORDER — MIDAZOLAM HCL 5 MG/5ML IJ SOLN
INTRAMUSCULAR | Status: AC
Start: 2015-03-17 — End: 2015-03-17
  Filled 2015-03-17: qty 5

## 2015-03-17 MED ORDER — PREDNISOLONE ACETATE 1 % OP SUSP
1.0000 [drp] | Freq: Two times a day (BID) | OPHTHALMIC | Status: DC
  Administered 2015-03-17: 1 [drp] via OPHTHALMIC
  Filled 2015-03-17: qty 1

## 2015-03-17 SURGICAL SUPPLY — 21 items
ADAPTER SEALING SSA-EW-09 (MISCELLANEOUS) ×3 IMPLANT
BALLN ATTAIN 80 (BALLOONS) ×2
BALLN ATTAIN 80CM 6215 (BALLOONS) ×1
BALLOON ATTAIN 80 (BALLOONS) ×1 IMPLANT
CABLE SURGICAL S-101-97-12 (CABLE) ×3 IMPLANT
CATH ATTAIN COM SURV 6250V-EH (CATHETERS) ×3 IMPLANT
CATH ATTAIN SEL SURV 6248V-90 (CATHETERS) ×3 IMPLANT
DEVICE DISSECT PLASMABLAD 3.0S (MISCELLANEOUS) ×1 IMPLANT
GUIDEWIRE ANGLED .035X150CM (WIRE) ×3 IMPLANT
ICD VIVA QUAD XT CRT-D DTBA1QQ (ICD Generator) ×3 IMPLANT
KIT ESSENTIALS PG (KITS) ×3 IMPLANT
LEAD ATTAIN PERFOMA 4298-88CM (Lead) ×3 IMPLANT
PAD DEFIB LIFELINK (PAD) ×3 IMPLANT
PLASMABLADE 3.0S (MISCELLANEOUS) ×3
SHEATH CLASSIC 9.5F (SHEATH) ×3 IMPLANT
SHEATH PINNACLE 6F 10CM (SHEATH) ×3 IMPLANT
SLITTER 6230UNI (MISCELLANEOUS) ×3 IMPLANT
TOOL TRANSVALV INSERT TVI-07 (MISCELLANEOUS) ×3 IMPLANT
TRAY PACEMAKER INSERTION (PACKS) ×3 IMPLANT
WIRE ACUITY WHISPER EDS 4648 (WIRE) ×3 IMPLANT
WIRE HI TORQ VERSACORE-J 145CM (WIRE) ×3 IMPLANT

## 2015-03-17 NOTE — Discharge Instructions (Signed)
° ° °  Supplemental Discharge Instructions for  Pacemaker/Defibrillator Patients  Activity No heavy lifting or vigorous activity with your left/right arm for 6 to 8 weeks.  Do not raise your left/right arm above your head for one week.  Gradually raise your affected arm as drawn below.            03/22/15                     03/23/15                     03/24/15                  03/25/15 __  NO DRIVING for  1 week   ; you may begin driving on [id number]    .  WOUND CARE - Keep the wound area clean and dry.  Do not get this area wet for one week. No showers for one week; you may shower on  03/25/15   . - The tape/steri-strips on your wound will fall off; do not pull them off.  No bandage is needed on the site.  DO  NOT apply any creams, oils, or ointments to the wound area. - If you notice any drainage or discharge from the wound, any swelling or bruising at the site, or you develop a fever > 101? F after you are discharged home, call the office at once.  Special Instructions - You are still able to use cellular telephones; use the ear opposite the side where you have your pacemaker/defibrillator.  Avoid carrying your cellular phone near your device. - When traveling through airports, show security personnel your identification card to avoid being screened in the metal detectors.  Ask the security personnel to use the hand wand. - Avoid arc welding equipment, MRI testing (magnetic resonance imaging), TENS units (transcutaneous nerve stimulators).  Call the office for questions about other devices. - Avoid electrical appliances that are in poor condition or are not properly grounded. - Microwave ovens are safe to be near or to operate.  Additional information for defibrillator patients should your device go off: - If your device goes off ONCE and you feel fine afterward, notify the device clinic nurses. - If your device goes off ONCE and you do not feel well afterward, call 911. - If your device goes  off TWICE, call 911. - If your device goes off THREE times in one day, call 911.  DO NOT DRIVE YOURSELF OR A FAMILY MEMBER WITH A DEFIBRILLATOR TO THE HOSPITAL--CALL 911.
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
## 2015-03-17 NOTE — Progress Notes (Signed)
ICD Criteria  Current LVEF:30-35%. Within 12 months prior to implant: Yes   Heart failure history: Yes, Class II  Cardiomyopathy history: Yes, Ischemic Cardiomyopathy.  Atrial Fibrillation/Atrial Flutter: No.  Ventricular tachycardia history: No.  Cardiac arrest history: No.  History of syndromes with risk of sudden death: No.  Previous ICD: Yes, Reason for ICD:  Primary prevention.  Current ICD indication: Primary  PPM indication: No.   Class I or II Bradycardia indication present: No  Beta Blocker therapy for 3 or more months: Yes, prescribed.   Ace Inhibitor/ARB therapy for 3 or more months: No, medical reason.

## 2015-03-18 ENCOUNTER — Encounter (HOSPITAL_COMMUNITY): Payer: Self-pay | Admitting: Cardiology

## 2015-03-18 ENCOUNTER — Ambulatory Visit (HOSPITAL_COMMUNITY): Payer: Medicare Other

## 2015-03-18 DIAGNOSIS — I429 Cardiomyopathy, unspecified: Secondary | ICD-10-CM | POA: Diagnosis not present

## 2015-03-18 DIAGNOSIS — I447 Left bundle-branch block, unspecified: Secondary | ICD-10-CM | POA: Diagnosis not present

## 2015-03-18 DIAGNOSIS — I5022 Chronic systolic (congestive) heart failure: Secondary | ICD-10-CM

## 2015-03-18 DIAGNOSIS — I129 Hypertensive chronic kidney disease with stage 1 through stage 4 chronic kidney disease, or unspecified chronic kidney disease: Secondary | ICD-10-CM | POA: Diagnosis not present

## 2015-03-18 DIAGNOSIS — N184 Chronic kidney disease, stage 4 (severe): Secondary | ICD-10-CM | POA: Diagnosis not present

## 2015-03-18 DIAGNOSIS — Z950 Presence of cardiac pacemaker: Secondary | ICD-10-CM | POA: Diagnosis not present

## 2015-03-18 DIAGNOSIS — Z951 Presence of aortocoronary bypass graft: Secondary | ICD-10-CM | POA: Diagnosis not present

## 2015-03-18 MED ORDER — OXYCODONE HCL 5 MG PO TABS: 5.0000 mg | ORAL_TABLET | Freq: Four times a day (QID) | ORAL | Status: DC | PRN

## 2015-03-18 MED FILL — Sodium Chloride Irrigation Soln 0.9%: Qty: 500 | Status: AC

## 2015-03-18 MED FILL — Gentamicin Sulfate Inj 40 MG/ML: INTRAMUSCULAR | Qty: 2 | Status: AC

## 2015-03-18 NOTE — Discharge Summary (Signed)
ELECTROPHYSIOLOGY PROCEDURE DISCHARGE SUMMARY    Patient ID: [name],  MRN: [id number], DOB/AGE: [birth year] 70 y.o.  Admit date: 03/17/2015 Discharge date: 03/18/2015  Primary Care Physician: Wenda Low, MD Primary Cardiologist: Der. Croitoru Heart Failure: Dr. Aundra Dubin Electrophysiologist: Dr. Curt Bears  Primary Discharge Diagnosis:  1. Chronic combined CHF  Secondary Discharge Diagnosis:  1. CAD/CABG 2. COPD 3. DM 4. HTN 5. CRI 6. PVD/Carotid stent  No Known Allergies   Procedures This Admission:  1.  Upgrade/Implantation of a CRT ICD on 03/18/15 by Dr Curt Bears.  The patient received a Medtronic model [id number] Hillery Aldo XT CRT-D (serial Number [id number] ), a Medtronic model [id number] (serial number [id number] )  left ventricular lead.  DFT's were deferred at time of implant.  There were no immediate post procedure complications. 2.  CXR on 03/18/15 demonstrated no pneumothorax status post device implantation.   Brief HPI: [name] is a 70 y.o. male was referred to electrophysiology in the outpatient setting for consideration of an upgrade of his ICD to a CRT devioce.  Past medical history includes combined, chronic CHF, LBBB, CAD, HTN, HLD, COPD, PVD, and CRI.  The patient has persistent CHF symptoms and was recommended to undergo upgrade of his ICD to a CRT ICD device.  Risks, benefits, and alternatives to ICD explant and CRT ICD implantation were reviewed with the patient who wished to proceed.   Hospital Course:  The patient was admitted and underwent explant of his ICD generator and upgrade/implantation of a MDT CRT-ICD with details as outlined above.He was monitored on telemetry overnight which demonstrated SR with V pacing.  Left chest was without hematoma or ecchymosis.  The device was interrogated and found to be functioning normally.  CXR was obtained and demonstrated no pneumothorax status post device implantation.  Wound care, arm mobility, and restrictions  were reviewed with the patient.  The patient c/o moderate site discomfort, he was examined by Dr. Curt Bears, no hematoma, and considered stable for discharge to home. We Marcella Charlson give him 3 days of oxycodone for his site discomfort.  The patient's discharge medications include an ACE-I (none secondary to renal disease) and beta blocker (Carvediolol).   Physical Exam: Filed Vitals:   03/17/15 2200 03/18/15 0005 03/18/15 0430 03/18/15 0812  BP: 138/74 123/68 140/65 142/82  Pulse:  75 73 79  Temp:  98.2 F (36.8 C) 98 F (36.7 C) 98.2 F (36.8 C)  TempSrc:  Oral Oral Oral  Resp: 14 15 16 18   Height:      Weight:   208 lb 14.4 oz (94.756 kg)   SpO2:  96% 98% 97%   GEN- The patient is well appearing, alert and oriented x 3 today.   HEENT: normocephalic, atraumatic; sclera clear, conjunctiva pink; hearing intact; oropharynx clear Lungs- Clear to ausculation bilaterally, normal work of breathing.  No wheezes, rales, rhonchi Heart- Regular rate and rhythm, no murmurs, rubs or gallops, PMI not laterally displaced GI- soft, non-tender, non-distended, bowel sounds present Extremities- no clubbing, cyanosis, trace if any edema MS- no significant deformity or atrophy Skin- warm and dry, no rash or lesion, left chest without hematoma/ecchymosis Psych- euthymic mood, full affect Neuro- no gross defecits  Labs:   Lab Results  Component Value Date   WBC 3.5* 03/17/2015   HGB 10.8* 03/17/2015   HCT 32.8* 03/17/2015   MCV 93.4 03/17/2015   PLT 140* 03/17/2015    03/18/15: CXR IMPRESSION: Placement of biventricular ICD without complicating features.   Discharge  Medications:    Medication List    STOP taking these medications        oxyCODONE-acetaminophen 10-325 MG tablet  Commonly known as:  PERCOCET      TAKE these medications        amLODipine 10 MG tablet  Commonly known as:  NORVASC  Take 10 mg by mouth every morning.     aspirin 81 MG tablet  Take 81 mg by mouth daily.      atorvastatin 40 MG tablet  Commonly known as:  LIPITOR  Take 40 mg by mouth every morning.     carvedilol 12.5 MG tablet  Commonly known as:  COREG  Take 1 tablet (12.5 mg total) by mouth 2 (two) times daily.     clopidogrel 75 MG tablet  Commonly known as:  PLAVIX  Take 75 mg by mouth daily.     dextromethorphan-guaiFENesin 30-600 MG 12hr tablet  Commonly known as:  MUCINEX DM  Take 1 tablet by mouth daily.     glimepiride 1 MG tablet  Commonly known as:  AMARYL  Take 1 tablet by mouth daily. Reported on 03/10/2015     isosorbide-hydrALAZINE 20-37.5 MG tablet  Commonly known as:  BIDIL  Take 1 tablet by mouth 3 (three) times daily.     oxyCODONE 5 MG immediate release tablet  Commonly known as:  Oxy IR/ROXICODONE  Take 1 tablet (5 mg total) by mouth every 6 (six) hours as needed for moderate pain.     potassium chloride SA 20 MEQ tablet  Commonly known as:  K-DUR,KLOR-CON  Take 1 tablet (20 mEq total) by mouth daily.     prednisoLONE acetate 1 % ophthalmic suspension  Commonly known as:  PRED FORTE  Place 1 drop into both eyes 2 (two) times daily.     torsemide 20 MG tablet  Commonly known as:  DEMADEX  Take 4 tabs every other day ALTERNATING with 3 tabs every other day     traZODone 150 MG tablet  Commonly known as:  DESYREL  Take 150 mg by mouth at bedtime.        Disposition:  Home Discharge Instructions    Diet - low sodium heart healthy    Complete by:  As directed      Increase activity slowly    Complete by:  As directed           Follow-up Information    Follow up with [facility] On 03/28/2015.   Specialty:  Cardiology   Why:  11:00AM wound check   Contact information:   [address] [place] [phone]      Follow up with Shyane Fossum Meredith Leeds, MD.   Specialty:  Cardiology   Why:  an office scheduler Crystol Walpole be calling to schedule a 3 month office visit with Dr. Joya San  information:   [place] [address] [phone]       Duration of Discharge Encounter: Greater than 30 minutes including physician time.  Signed, Tommye Standard, PA-C 03/18/2015 8:40 AM   I have seen and examined this patient with Tommye Standard.  Agree with above, note added to reflect my findings.  On exam, regular rhythm, no murmurs, lungs clear.  Had LV lead added to existing ICD.  Patient tolerated procedure well without issues.  Plan for discharge with usual follow up in device clinic.    Hagop Mccollam M. Tomi Grandpre  MD 03/21/2015 10:56 AM

## 2015-03-21 DIAGNOSIS — H2511 Age-related nuclear cataract, right eye: Secondary | ICD-10-CM | POA: Diagnosis not present

## 2015-03-23 DIAGNOSIS — H2511 Age-related nuclear cataract, right eye: Secondary | ICD-10-CM | POA: Diagnosis not present

## 2015-03-23 DIAGNOSIS — H268 Other specified cataract: Secondary | ICD-10-CM | POA: Diagnosis not present

## 2015-03-28 ENCOUNTER — Encounter: Payer: Self-pay | Admitting: Cardiology

## 2015-03-28 ENCOUNTER — Ambulatory Visit (INDEPENDENT_AMBULATORY_CARE_PROVIDER_SITE_OTHER): Payer: Medicare Other | Admitting: *Deleted

## 2015-03-28 DIAGNOSIS — I255 Ischemic cardiomyopathy: Secondary | ICD-10-CM

## 2015-03-28 LAB — CUP PACEART INCLINIC DEVICE CHECK
Brady Statistic AP VP Percent: 17.87 %
Brady Statistic AP VS Percent: 0.32 %
Brady Statistic AS VP Percent: 80.22 %
Brady Statistic RA Percent Paced: 18.19 %
Brady Statistic RV Percent Paced: 14.59 %
Date Time Interrogation Session: 20170116114036
HighPow Impedance: 45 Ohm
Implantable Lead Implant Date: 20140207
Implantable Lead Implant Date: 20170105
Implantable Lead Model: 6935
Lead Channel Impedance Value: 247 Ohm
Lead Channel Impedance Value: 304 Ohm
Lead Channel Impedance Value: 304 Ohm
Lead Channel Impedance Value: 361 Ohm
Lead Channel Impedance Value: 361 Ohm
Lead Channel Impedance Value: 532 Ohm
Lead Channel Impedance Value: 551 Ohm
Lead Channel Pacing Threshold Amplitude: 0.5 V
Lead Channel Pacing Threshold Amplitude: 0.625 V
Lead Channel Pacing Threshold Pulse Width: 0.4 ms
Lead Channel Sensing Intrinsic Amplitude: 18.375 mV
Lead Channel Sensing Intrinsic Amplitude: 18.5 mV
Lead Channel Sensing Intrinsic Amplitude: 2 mV
Lead Channel Setting Pacing Pulse Width: 0.4 ms
MDC IDC LEAD IMPLANT DT: 20140207
MDC IDC LEAD LOCATION: 753858
MDC IDC LEAD LOCATION: 753859
MDC IDC LEAD LOCATION: 753860
MDC IDC LEAD MODEL: 4298
MDC IDC MSMT BATTERY REMAINING LONGEVITY: 99 mo
MDC IDC MSMT BATTERY VOLTAGE: 3.09 V
MDC IDC MSMT LEADCHNL LV IMPEDANCE VALUE: 418 Ohm
MDC IDC MSMT LEADCHNL LV IMPEDANCE VALUE: 475 Ohm
MDC IDC MSMT LEADCHNL LV IMPEDANCE VALUE: 475 Ohm
MDC IDC MSMT LEADCHNL LV IMPEDANCE VALUE: 513 Ohm
MDC IDC MSMT LEADCHNL LV PACING THRESHOLD AMPLITUDE: 1.125 V
MDC IDC MSMT LEADCHNL LV PACING THRESHOLD PULSEWIDTH: 0.4 ms
MDC IDC MSMT LEADCHNL RA PACING THRESHOLD PULSEWIDTH: 0.4 ms
MDC IDC MSMT LEADCHNL RA SENSING INTR AMPL: 1.75 mV
MDC IDC MSMT LEADCHNL RV IMPEDANCE VALUE: 342 Ohm
MDC IDC MSMT LEADCHNL RV IMPEDANCE VALUE: 399 Ohm
MDC IDC SET LEADCHNL LV PACING AMPLITUDE: 2.75 V
MDC IDC SET LEADCHNL RA PACING AMPLITUDE: 1.5 V
MDC IDC SET LEADCHNL RV SENSING SENSITIVITY: 0.3 mV
MDC IDC STAT BRADY AS VS PERCENT: 1.59 %

## 2015-03-28 NOTE — Progress Notes (Signed)
Wound check appointment. Steri-strips removed. Wound without redness or edema. Incision edges approximated, wound well healed. Patient presents with c/o L hand edema since implant. Patient denies any discomfort in hand, and L arm is not visibly edematous. Findings discussed with Dr.Camnitz who feels that pt should give it a couple of weeks to resolve and if it doesn't he should contact his PCP for further evaluation.  CRT-D checked in office. Normal device function. Thresholds, sensing, and impedances consistent with implant measurements. Device programmed at appropriate safety margins. Histogram distribution appropriate for patient and level of activity. No mode switches or ventricular arrhythmias noted. (1) VS episode x 7 sec @ 188/95---AT per markers. Patient educated about wound care, arm mobility, lifting restrictions, shock plan. ROV in 3 months with WC.

## 2015-04-07 ENCOUNTER — Telehealth (HOSPITAL_COMMUNITY): Payer: Self-pay | Admitting: Pharmacist

## 2015-04-07 MED ORDER — HYDRALAZINE HCL 25 MG PO TABS: 37.5000 mg | ORAL_TABLET | Freq: Three times a day (TID) | ORAL | Status: DC

## 2015-04-07 MED ORDER — ISOSORBIDE DINITRATE 20 MG PO TABS: 20.0000 mg | ORAL_TABLET | Freq: Three times a day (TID) | ORAL | Status: DC

## 2015-04-07 NOTE — Telephone Encounter (Signed)
Spoke with [name] with paramedicine who states that [name] will be running out of his hydralazine and isosorbide next week and was wondering if he needed to go back on Bidil since off of backorder. Looked into cost of Bidil which is a tier 4 medication through his [employer] insurance and the cheapest we could get it for him would be if we go through [facility] which would charge him $40/mo. [name] states that this is too expensive for him so will refill hydralazine 37.5 mg PO TID and isosorbide dinitrate 20 mg TID which are both tier 2 medications at [facility]   [name]. [name], PharmD, BCPS, CPP Clinical Pharmacist Pager: [phone] Phone: [phone] 04/07/2015 9:39 AM

## 2015-04-10 ENCOUNTER — Emergency Department (HOSPITAL_COMMUNITY): Payer: Medicare Other

## 2015-04-10 ENCOUNTER — Encounter (HOSPITAL_COMMUNITY): Payer: Self-pay | Admitting: *Deleted

## 2015-04-10 ENCOUNTER — Emergency Department (HOSPITAL_COMMUNITY)
Admission: EM | Admit: 2015-04-10 | Discharge: 2015-04-10 | Disposition: A | Discharge status: Home / Self Care | Payer: Medicare Other | Attending: Emergency Medicine | Admitting: Emergency Medicine

## 2015-04-10 DIAGNOSIS — E785 Hyperlipidemia, unspecified: Secondary | ICD-10-CM | POA: Insufficient documentation

## 2015-04-10 DIAGNOSIS — S99911A Unspecified injury of right ankle, initial encounter: Secondary | ICD-10-CM | POA: Insufficient documentation

## 2015-04-10 DIAGNOSIS — E119 Type 2 diabetes mellitus without complications: Secondary | ICD-10-CM | POA: Insufficient documentation

## 2015-04-10 DIAGNOSIS — Z95810 Presence of automatic (implantable) cardiac defibrillator: Secondary | ICD-10-CM | POA: Diagnosis not present

## 2015-04-10 DIAGNOSIS — Z79899 Other long term (current) drug therapy: Secondary | ICD-10-CM | POA: Insufficient documentation

## 2015-04-10 DIAGNOSIS — Z7984 Long term (current) use of oral hypoglycemic drugs: Secondary | ICD-10-CM | POA: Insufficient documentation

## 2015-04-10 DIAGNOSIS — I129 Hypertensive chronic kidney disease with stage 1 through stage 4 chronic kidney disease, or unspecified chronic kidney disease: Secondary | ICD-10-CM | POA: Diagnosis not present

## 2015-04-10 DIAGNOSIS — Z7952 Long term (current) use of systemic steroids: Secondary | ICD-10-CM | POA: Diagnosis not present

## 2015-04-10 DIAGNOSIS — M1990 Unspecified osteoarthritis, unspecified site: Secondary | ICD-10-CM | POA: Insufficient documentation

## 2015-04-10 DIAGNOSIS — I252 Old myocardial infarction: Secondary | ICD-10-CM | POA: Diagnosis not present

## 2015-04-10 DIAGNOSIS — Y998 Other external cause status: Secondary | ICD-10-CM | POA: Insufficient documentation

## 2015-04-10 DIAGNOSIS — I5042 Chronic combined systolic (congestive) and diastolic (congestive) heart failure: Secondary | ICD-10-CM | POA: Insufficient documentation

## 2015-04-10 DIAGNOSIS — Y9389 Activity, other specified: Secondary | ICD-10-CM | POA: Diagnosis not present

## 2015-04-10 DIAGNOSIS — R0602 Shortness of breath: Secondary | ICD-10-CM | POA: Diagnosis not present

## 2015-04-10 DIAGNOSIS — R05 Cough: Secondary | ICD-10-CM | POA: Diagnosis present

## 2015-04-10 DIAGNOSIS — Z87891 Personal history of nicotine dependence: Secondary | ICD-10-CM | POA: Diagnosis not present

## 2015-04-10 DIAGNOSIS — J441 Chronic obstructive pulmonary disease with (acute) exacerbation: Secondary | ICD-10-CM | POA: Insufficient documentation

## 2015-04-10 DIAGNOSIS — Z7902 Long term (current) use of antithrombotics/antiplatelets: Secondary | ICD-10-CM | POA: Diagnosis not present

## 2015-04-10 DIAGNOSIS — M79671 Pain in right foot: Secondary | ICD-10-CM | POA: Diagnosis not present

## 2015-04-10 DIAGNOSIS — N183 Chronic kidney disease, stage 3 (moderate): Secondary | ICD-10-CM | POA: Diagnosis not present

## 2015-04-10 DIAGNOSIS — Z951 Presence of aortocoronary bypass graft: Secondary | ICD-10-CM | POA: Diagnosis not present

## 2015-04-10 DIAGNOSIS — Z9889 Other specified postprocedural states: Secondary | ICD-10-CM | POA: Insufficient documentation

## 2015-04-10 DIAGNOSIS — Z7982 Long term (current) use of aspirin: Secondary | ICD-10-CM | POA: Diagnosis not present

## 2015-04-10 DIAGNOSIS — M25571 Pain in right ankle and joints of right foot: Secondary | ICD-10-CM | POA: Diagnosis not present

## 2015-04-10 DIAGNOSIS — Y9289 Other specified places as the place of occurrence of the external cause: Secondary | ICD-10-CM | POA: Diagnosis not present

## 2015-04-10 LAB — CBC WITH DIFFERENTIAL/PLATELET
Basophils Absolute: 0 10*3/uL (ref 0.0–0.1)
Basophils Relative: 1 %
EOS ABS: 0.2 10*3/uL (ref 0.0–0.7)
Eosinophils Relative: 4 %
HEMATOCRIT: 32.5 % — AB (ref 39.0–52.0)
HEMOGLOBIN: 10.5 g/dL — AB (ref 13.0–17.0)
LYMPHS ABS: 1 10*3/uL (ref 0.7–4.0)
LYMPHS PCT: 22 %
MCH: 30 pg (ref 26.0–34.0)
MCHC: 32.3 g/dL (ref 30.0–36.0)
MCV: 92.9 fL (ref 78.0–100.0)
MONOS PCT: 11 %
Monocytes Absolute: 0.5 10*3/uL (ref 0.1–1.0)
NEUTROS PCT: 62 %
Neutro Abs: 2.7 10*3/uL (ref 1.7–7.7)
Platelets: 137 10*3/uL — ABNORMAL LOW (ref 150–400)
RBC: 3.5 MIL/uL — AB (ref 4.22–5.81)
RDW: 13.4 % (ref 11.5–15.5)
WBC: 4.4 10*3/uL (ref 4.0–10.5)

## 2015-04-10 LAB — BASIC METABOLIC PANEL
Anion gap: 10 (ref 5–15)
BUN: 55 mg/dL — AB (ref 6–20)
CHLORIDE: 109 mmol/L (ref 101–111)
CO2: 23 mmol/L (ref 22–32)
CREATININE: 3.19 mg/dL — AB (ref 0.61–1.24)
Calcium: 10 mg/dL (ref 8.9–10.3)
GFR calc Af Amer: 21 mL/min — ABNORMAL LOW (ref >60)
GFR calc non Af Amer: 19 mL/min — ABNORMAL LOW (ref >60)
GLUCOSE: 94 mg/dL (ref 65–99)
POTASSIUM: 4.9 mmol/L (ref 3.5–5.1)
Sodium: 142 mmol/L (ref 135–145)

## 2015-04-10 MED ORDER — PREDNISONE 10 MG PO TABS: 40.0000 mg | ORAL_TABLET | Freq: Every day | ORAL | Status: AC

## 2015-04-10 MED ORDER — ALBUTEROL SULFATE (2.5 MG/3ML) 0.083% IN NEBU
INHALATION_SOLUTION | RESPIRATORY_TRACT | Status: AC
  Filled 2015-04-10: qty 3

## 2015-04-10 MED ORDER — ALBUTEROL SULFATE (2.5 MG/3ML) 0.083% IN NEBU
2.5000 mg | INHALATION_SOLUTION | RESPIRATORY_TRACT | Status: AC
  Administered 2015-04-10: 2.5 mg via RESPIRATORY_TRACT

## 2015-04-10 MED ORDER — IPRATROPIUM-ALBUTEROL 0.5-2.5 (3) MG/3ML IN SOLN
3.0000 mL | RESPIRATORY_TRACT | Status: AC
  Administered 2015-04-10: 3 mL via RESPIRATORY_TRACT

## 2015-04-10 MED ORDER — IPRATROPIUM-ALBUTEROL 0.5-2.5 (3) MG/3ML IN SOLN
RESPIRATORY_TRACT | Status: AC
  Administered 2015-04-10: 3 mL via RESPIRATORY_TRACT
  Filled 2015-04-10: qty 3

## 2015-04-10 MED ORDER — AZITHROMYCIN 250 MG PO TABS: ORAL_TABLET | ORAL | Status: DC

## 2015-04-10 MED ORDER — PREDNISONE 20 MG PO TABS
60.0000 mg | ORAL_TABLET | Freq: Once | ORAL | Status: AC
  Administered 2015-04-10: 60 mg via ORAL
  Filled 2015-04-10: qty 3

## 2015-04-10 MED ORDER — ALBUTEROL SULFATE HFA 108 (90 BASE) MCG/ACT IN AERS
2.0000 | INHALATION_SPRAY | Freq: Once | RESPIRATORY_TRACT | Status: AC
  Administered 2015-04-10: 2 via RESPIRATORY_TRACT
  Filled 2015-04-10: qty 6.7

## 2015-04-10 MED ORDER — HYDROCODONE-ACETAMINOPHEN 5-325 MG PO TABS
1.0000 | ORAL_TABLET | Freq: Once | ORAL | Status: AC
  Administered 2015-04-10: 1 via ORAL
  Filled 2015-04-10: qty 1

## 2015-04-10 MED ORDER — IPRATROPIUM-ALBUTEROL 0.5-2.5 (3) MG/3ML IN SOLN: 3.0000 mL | RESPIRATORY_TRACT | Status: DC | PRN

## 2015-04-10 MED ORDER — ALBUTEROL SULFATE HFA 108 (90 BASE) MCG/ACT IN AERS: 4.0000 | INHALATION_SPRAY | RESPIRATORY_TRACT | Status: DC | PRN

## 2015-04-10 NOTE — ED Notes (Signed)
Pt transported to xray 

## 2015-04-10 NOTE — ED Notes (Signed)
Pt reports injuring right ankle several days ago and having pain. Also reports productive cough with white/gray sputum. spo2 99% at triage and no resp distress noted.

## 2015-04-10 NOTE — Discharge Instructions (Signed)
Chronic Obstructive Pulmonary Disease Chronic obstructive pulmonary disease (COPD) is a common lung condition in which airflow from the lungs is limited. COPD is a general term that can be used to describe many different lung problems that limit airflow, including both chronic bronchitis and emphysema. If you have COPD, your lung function will probably never return to normal, but there are measures you can take to improve lung function and make yourself feel better. CAUSES   Smoking (common).  Exposure to secondhand smoke.  Genetic problems.  Chronic inflammatory lung diseases or recurrent infections. SYMPTOMS  Shortness of breath, especially with physical activity.  Deep, persistent (chronic) cough with a large amount of thick mucus.  Wheezing.  Rapid breaths (tachypnea).  Gray or bluish discoloration (cyanosis) of the skin, especially in your fingers, toes, or lips.  Fatigue.  Weight loss.  Frequent infections or episodes when breathing symptoms become much worse (exacerbations).  Chest tightness. DIAGNOSIS Your health care provider will take a medical history and perform a physical examination to diagnose COPD. Additional tests for COPD may include:  Lung (pulmonary) function tests.  Chest X-ray.  CT scan.  Blood tests. TREATMENT  Treatment for COPD may include:  Inhaler and nebulizer medicines. These help manage the symptoms of COPD and make your breathing more comfortable.  Supplemental oxygen. Supplemental oxygen is only helpful if you have a low oxygen level in your blood.  Exercise and physical activity. These are beneficial for nearly all people with COPD.  Lung surgery or transplant.  Nutrition therapy to gain weight, if you are underweight.  Pulmonary rehabilitation. This may involve working with a team of health care providers and specialists, such as respiratory, occupational, and physical therapists. HOME CARE INSTRUCTIONS  Take all medicines  (inhaled or pills) as directed by your health care provider.  Avoid over-the-counter medicines or cough syrups that dry up your airway (such as antihistamines) and slow down the elimination of secretions unless instructed otherwise by your health care provider.  If you are a smoker, the most important thing that you can do is stop smoking. Continuing to smoke will cause further lung damage and breathing trouble. Ask your health care provider for help with quitting smoking. He or she can direct you to community resources or hospitals that provide support.  Avoid exposure to irritants such as smoke, chemicals, and fumes that aggravate your breathing.  Use oxygen therapy and pulmonary rehabilitation if directed by your health care provider. If you require home oxygen therapy, ask your health care provider whether you should purchase a pulse oximeter to measure your oxygen level at home.  Avoid contact with individuals who have a contagious illness.  Avoid extreme temperature and humidity changes.  Eat healthy foods. Eating smaller, more frequent meals and resting before meals may help you maintain your strength.  Stay active, but balance activity with periods of rest. Exercise and physical activity will help you maintain your ability to do things you want to do.  Preventing infection and hospitalization is very important when you have COPD. Make sure to receive all the vaccines your health care provider recommends, especially the pneumococcal and influenza vaccines. Ask your health care provider whether you need a pneumonia vaccine.  Learn and use relaxation techniques to manage stress.  Learn and use controlled breathing techniques as directed by your health care provider. Controlled breathing techniques include:  Pursed lip breathing. Start by breathing in (inhaling) through your nose for 1 second. Then, purse your lips as if you were   going to whistle and breathe out (exhale) through the  pursed lips for 2 seconds.  Diaphragmatic breathing. Start by putting one hand on your abdomen just above your waist. Inhale slowly through your nose. The hand on your abdomen should move out. Then purse your lips and exhale slowly. You should be able to feel the hand on your abdomen moving in as you exhale.  Learn and use controlled coughing to clear mucus from your lungs. Controlled coughing is a series of short, progressive coughs. The steps of controlled coughing are: 1. Lean your head slightly forward. 2. Breathe in deeply using diaphragmatic breathing. 3. Try to hold your breath for 3 seconds. 4. Keep your mouth slightly open while coughing twice. 5. Spit any mucus out into a tissue. 6. Rest and repeat the steps once or twice as needed. SEEK MEDICAL CARE IF:  You are coughing up more mucus than usual.  There is a change in the color or thickness of your mucus.  Your breathing is more labored than usual.  Your breathing is faster than usual. SEEK IMMEDIATE MEDICAL CARE IF:  You have shortness of breath while you are resting.  You have shortness of breath that prevents you from:  Being able to talk.  Performing your usual physical activities.  You have chest pain lasting longer than 5 minutes.  Your skin color is more cyanotic than usual.  You measure low oxygen saturations for longer than 5 minutes with a pulse oximeter. MAKE SURE YOU:  Understand these instructions.  Will watch your condition.  Will get help right away if you are not doing well or get worse.   This information is not intended to replace advice given to you by your health care provider. Make sure you discuss any questions you have with your health care provider.   Document Released: 12/06/2004 Document Revised: 03/19/2014 Document Reviewed: 10/23/2012 Elsevier Interactive Patient Education 2016 [employer]  
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
## 2015-04-18 DIAGNOSIS — I5020 Unspecified systolic (congestive) heart failure: Secondary | ICD-10-CM | POA: Diagnosis not present

## 2015-04-18 DIAGNOSIS — G8929 Other chronic pain: Secondary | ICD-10-CM | POA: Diagnosis not present

## 2015-04-18 DIAGNOSIS — I10 Essential (primary) hypertension: Secondary | ICD-10-CM | POA: Diagnosis not present

## 2015-04-18 DIAGNOSIS — I5032 Chronic diastolic (congestive) heart failure: Secondary | ICD-10-CM | POA: Diagnosis not present

## 2015-04-18 DIAGNOSIS — N184 Chronic kidney disease, stage 4 (severe): Secondary | ICD-10-CM | POA: Diagnosis not present

## 2015-04-18 DIAGNOSIS — D696 Thrombocytopenia, unspecified: Secondary | ICD-10-CM | POA: Diagnosis not present

## 2015-04-18 DIAGNOSIS — M519 Unspecified thoracic, thoracolumbar and lumbosacral intervertebral disc disorder: Secondary | ICD-10-CM | POA: Diagnosis not present

## 2015-04-18 DIAGNOSIS — M1990 Unspecified osteoarthritis, unspecified site: Secondary | ICD-10-CM | POA: Diagnosis not present

## 2015-04-18 DIAGNOSIS — E1122 Type 2 diabetes mellitus with diabetic chronic kidney disease: Secondary | ICD-10-CM | POA: Diagnosis not present

## 2015-04-18 DIAGNOSIS — Z7984 Long term (current) use of oral hypoglycemic drugs: Secondary | ICD-10-CM | POA: Diagnosis not present

## 2015-04-18 DIAGNOSIS — I6523 Occlusion and stenosis of bilateral carotid arteries: Secondary | ICD-10-CM | POA: Diagnosis not present

## 2015-04-18 DIAGNOSIS — Z95810 Presence of automatic (implantable) cardiac defibrillator: Secondary | ICD-10-CM | POA: Diagnosis not present

## 2015-04-21 ENCOUNTER — Ambulatory Visit: Payer: Medicare Other | Admitting: Physician Assistant

## 2015-04-28 ENCOUNTER — Encounter: Payer: Self-pay | Admitting: Physician Assistant

## 2015-04-28 ENCOUNTER — Ambulatory Visit (INDEPENDENT_AMBULATORY_CARE_PROVIDER_SITE_OTHER): Payer: Medicare Other | Admitting: Physician Assistant

## 2015-04-28 VITALS — BP 120/62 | HR 68 | Ht 68.0 in | Wt 202.0 lb

## 2015-04-28 DIAGNOSIS — I255 Ischemic cardiomyopathy: Secondary | ICD-10-CM
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
## 2015-04-28 NOTE — Patient Instructions (Signed)
Your physician recommends that you schedule a follow-up appointment in: 3 months with Dr Curt Bears in 3 months. Dr Sallyanne Kuster in 6 months or sooner if needed.  I will have the device technician contact you to discuss how to check your defibrillator at home.

## 2015-05-09 ENCOUNTER — Ambulatory Visit (HOSPITAL_COMMUNITY)
Admission: RE | Admit: 2015-05-09 | Discharge: 2015-05-09 | Disposition: A | Discharge status: Home / Self Care | Payer: Medicare Other | Source: Ambulatory Visit | Attending: Cardiology | Admitting: Cardiology

## 2015-05-09 ENCOUNTER — Encounter (HOSPITAL_COMMUNITY): Payer: Self-pay

## 2015-05-09 VITALS — BP 118/62 | HR 80 | Wt 198.8 lb

## 2015-05-09 DIAGNOSIS — I255 Ischemic cardiomyopathy: Secondary | ICD-10-CM | POA: Diagnosis not present

## 2015-05-09 DIAGNOSIS — J449 Chronic obstructive pulmonary disease, unspecified: Secondary | ICD-10-CM | POA: Insufficient documentation

## 2015-05-09 DIAGNOSIS — I130 Hypertensive heart and chronic kidney disease with heart failure and stage 1 through stage 4 chronic kidney disease, or unspecified chronic kidney disease: Secondary | ICD-10-CM | POA: Diagnosis not present

## 2015-05-09 DIAGNOSIS — Z8249 Family history of ischemic heart disease and other diseases of the circulatory system: Secondary | ICD-10-CM | POA: Diagnosis not present

## 2015-05-09 DIAGNOSIS — Z7982 Long term (current) use of aspirin: Secondary | ICD-10-CM | POA: Insufficient documentation

## 2015-05-09 DIAGNOSIS — Z7984 Long term (current) use of oral hypoglycemic drugs: Secondary | ICD-10-CM | POA: Insufficient documentation

## 2015-05-09 DIAGNOSIS — Z833 Family history of diabetes mellitus: Secondary | ICD-10-CM | POA: Insufficient documentation

## 2015-05-09 DIAGNOSIS — I5022 Chronic systolic (congestive) heart failure: Secondary | ICD-10-CM | POA: Insufficient documentation

## 2015-05-09 DIAGNOSIS — Z7902 Long term (current) use of antithrombotics/antiplatelets: Secondary | ICD-10-CM | POA: Diagnosis not present

## 2015-05-09 DIAGNOSIS — Z951 Presence of aortocoronary bypass graft: Secondary | ICD-10-CM | POA: Insufficient documentation

## 2015-05-09 DIAGNOSIS — E1122 Type 2 diabetes mellitus with diabetic chronic kidney disease: Secondary | ICD-10-CM | POA: Diagnosis not present

## 2015-05-09 DIAGNOSIS — N184 Chronic kidney disease, stage 4 (severe): Secondary | ICD-10-CM | POA: Diagnosis not present

## 2015-05-09 DIAGNOSIS — I6529 Occlusion and stenosis of unspecified carotid artery: Secondary | ICD-10-CM | POA: Insufficient documentation

## 2015-05-09 DIAGNOSIS — I5042 Chronic combined systolic (congestive) and diastolic (congestive) heart failure: Secondary | ICD-10-CM | POA: Diagnosis not present

## 2015-05-09 DIAGNOSIS — Z87891 Personal history of nicotine dependence: Secondary | ICD-10-CM | POA: Diagnosis not present

## 2015-05-09 DIAGNOSIS — I2510 Atherosclerotic heart disease of native coronary artery without angina pectoris: Secondary | ICD-10-CM | POA: Insufficient documentation

## 2015-05-09 DIAGNOSIS — E785 Hyperlipidemia, unspecified: Secondary | ICD-10-CM | POA: Insufficient documentation

## 2015-05-09 DIAGNOSIS — Z79899 Other long term (current) drug therapy: Secondary | ICD-10-CM | POA: Insufficient documentation

## 2015-05-09 DIAGNOSIS — Z95810 Presence of automatic (implantable) cardiac defibrillator: Secondary | ICD-10-CM | POA: Insufficient documentation

## 2015-05-09 LAB — BASIC METABOLIC PANEL
Anion gap: 13 (ref 5–15)
BUN: 43 mg/dL — AB (ref 6–20)
CHLORIDE: 107 mmol/L (ref 101–111)
CO2: 21 mmol/L — ABNORMAL LOW (ref 22–32)
Calcium: 9.8 mg/dL (ref 8.9–10.3)
Creatinine, Ser: 2.77 mg/dL — ABNORMAL HIGH (ref 0.61–1.24)
GFR calc Af Amer: 25 mL/min — ABNORMAL LOW (ref >60)
GFR calc non Af Amer: 22 mL/min — ABNORMAL LOW (ref >60)
GLUCOSE: 85 mg/dL (ref 65–99)
POTASSIUM: 4.6 mmol/L (ref 3.5–5.1)
Sodium: 141 mmol/L (ref 135–145)

## 2015-05-09 LAB — BRAIN NATRIURETIC PEPTIDE: B Natriuretic Peptide: 230.5 pg/mL — ABNORMAL HIGH (ref 0.0–100.0)

## 2015-05-09 MED ORDER — ISOSORBIDE DINITRATE 30 MG PO TABS: 30.0000 mg | ORAL_TABLET | Freq: Three times a day (TID) | ORAL | Status: DC

## 2015-05-09 MED ORDER — HYDRALAZINE HCL 50 MG PO TABS: 75.0000 mg | ORAL_TABLET | Freq: Three times a day (TID) | ORAL | Status: DC

## 2015-05-09 MED ORDER — AMLODIPINE BESYLATE 5 MG PO TABS: 5.0000 mg | ORAL_TABLET | ORAL | Status: DC

## 2015-05-09 NOTE — Progress Notes (Signed)
Patient ID: [name], male   DOB: Aug 25, 1945, 70 y.o.   MRN: [id number] PCP: Dr Lysle Rubens Nephrology: Dr. Florene Glen Cardiology: Dr. Sallyanne Kuster HF Cardiology: Dr Aundra Dubin  HPI: [name] is a 70 y.o. with Chronic combined CHF 30-35% s/p Medtronic ICD placement 2/14, CAD s/p CABG x 3, COPD, DM, HTN, LBB, Carotid artery disease s/p stent 8/15 on DAPT, and CKD stage 3-4.   Admitted to [facility] 9/12 through 11/26/14. Diuresed with lasix drip and later transitioned to torsemide 60 mg daily. No ACEI, spironolactone, or digoxin with CKD. Discharge weight was 195 pounds.  Echo in 9/16 with EF 30-35%.  Medtronic CRT-D upgrade in 1/17.   He has been doing well recently.  Feels better since he had CRT upgrade.  Not short of breath walking on flat ground and can get up a flight of steps.  No chest pain.  No orthopnea/PND.    Labs (11/16): K 4.8 => 4.5, creatinine 3.62 => 2.97, BNP 287, LDL 34, HDL 39 Labs (1/17): K 4.9, creatinine 3.19  ROS: All systems negative except as listed in HPI, PMH and Problem List.  PMH: 1. CAD: s/p CABG with LIMA-LAD, SVG-LCx, SVG-PDA.  Cardiolite 6/15 with no ischemia.  2. Chronic systolic CHF: Ischemic CMP.  Medtronic ICD with CRT upgrade in 1/17.  Echo (9/16) with EF 30-35%, regional WMAs, mild MR, mildly decreased RV systolic function.  3. COPD 4. LBBB 5. Carotid stenosis: Carotid stent in 8/15.  6. CKD 7. Type II diabetes 8. HTN 9. Hyperlipidemia  SH:  Social History   Social History  . Marital Status: Divorced    Spouse Name: N/A  . Number of Children: N/A  . Years of Education: N/A   Occupational History  . Not on file.   Social History Main Topics  . Smoking status: Former Smoker    Quit date: 04/18/1982  . Smokeless tobacco: Never Used  . Alcohol Use: No  . Drug Use: 7.00 per week    Special: Marijuana  . Sexual Activity: No   Other Topics Concern  . Not on file   Social History Narrative    FH:  Family History  Problem Relation Age of Onset  .  Cancer Mother   . Hypertension Mother   . Cancer Sister   . Diabetes Brother     Current Outpatient Prescriptions  Medication Sig Dispense Refill  . albuterol (PROVENTIL HFA;VENTOLIN HFA) 108 (90 Base) MCG/ACT inhaler Inhale 4 puffs into the lungs every 4 (four) hours as needed for wheezing or shortness of breath. 6.7 g 0  . amLODipine (NORVASC) 5 MG tablet Take 1 tablet (5 mg total) by mouth every morning. 30 tablet 3  . aspirin 81 MG tablet Take 81 mg by mouth daily.    Marland Kitchen atorvastatin (LIPITOR) 40 MG tablet Take 40 mg by mouth every morning.     . carvedilol (COREG) 12.5 MG tablet Take 1 tablet (12.5 mg total) by mouth 2 (two) times daily. 60 tablet 3  . clopidogrel (PLAVIX) 75 MG tablet Take 75 mg by mouth daily.    Marland Kitchen dextromethorphan-guaiFENesin (MUCINEX DM) 30-600 MG 12hr tablet Take 1 tablet by mouth daily.    Marland Kitchen glimepiride (AMARYL) 1 MG tablet Take 1 tablet by mouth daily. Reported on 03/10/2015    . hydrALAZINE (APRESOLINE) 50 MG tablet Take 1.5 tablets (75 mg total) by mouth 3 (three) times daily. 135 tablet 3  . isosorbide dinitrate (ISORDIL) 30 MG tablet Take 1 tablet (30 mg total) by mouth  3 (three) times daily. 90 tablet 3  . oxyCODONE-acetaminophen (PERCOCET) 10-325 MG tablet 10-325 tablets 3 (three) times daily as needed.  0  . potassium chloride SA (K-DUR,KLOR-CON) 20 MEQ tablet Take 1 tablet (20 mEq total) by mouth daily. 15 tablet 0  . prednisoLONE acetate (PRED FORTE) 1 % ophthalmic suspension Place 1 drop into both eyes 2 (two) times daily.    Marland Kitchen torsemide (DEMADEX) 20 MG tablet Take 4 tabs every other day ALTERNATING with 3 tabs every other day 120 tablet 6  . traZODone (DESYREL) 150 MG tablet Take 150 mg by mouth at bedtime.     No current facility-administered medications for this encounter.    Filed Vitals:   05/09/15 1033  BP: 118/62  Pulse: 80  Weight: 198 lb 12 oz (90.152 kg)  SpO2: 98%    PHYSICAL EXAM:  General:  Well appearing. No resp difficulty.  Ambulated in the clinic without difficulty.  HEENT: normal Neck: supple. JVP flat. Carotids 2+ bilaterally; no bruits. No lymphadenopathy or thryomegaly appreciated. Cor: PMI normal. Regular rate & rhythm. No rubs, gallops or murmurs. Lungs: clear Abdomen: soft, nontender, nondistended. No hepatosplenomegaly. No bruits or masses. Good bowel sounds. Extremities: no cyanosis, clubbing, rash.  1+ ankle edema Neuro: alert & orientedx3, cranial nerves grossly intact. Moves all 4 extremities w/o difficulty. Affect pleasant.  ASSESSMENT & PLAN: 1. Chronic systolic CHF:  Echo 123XX123 with LVEF 30-35%, Grade 2 DD, PA peak pressure 53, RV systolic function mildly reduced. Ischemic cardiomyopathy. S/p Medtronic ICD, now upgraded to CRT-D. NYHA class II symptoms, improved. He is now volume overloaded on exam.  - Continue current Coreg.   - Increase hydralazine to 75 mg tid and isordil to 30 mg tid.  He will also decrease amlodipine to 5 mg daily.  - No ACEI or spironolactone with CKD stage IV.  - Continue torsemide 80 mg daily alternating with 60 mg daily.  BMET/BNP today.  Can stop KCl supplement if K remains upper normal or high. 2. CAD: s/p CABG 2006 with a LIMA to LAD, veins to the circumflex and PDA. Cardiolite performed 09/02/13 showed no ischemia. Denies CP.  - Continue ASA, Plavix, statin.  3. CKD stage 4: Check BMET today. He is followed by nephrology.   4. Hyperlipidemia: Good LDL 11/16.  5. Hypertension: BP controlled on current meds.  Back off on amlodipine as we titrate up hydralazine/isordil. 6. Carotid artery disease: Stent placed 8/15. On ASA and plavix - [place] 3/16 showed widely patent carotids bilaterally.   Followup in 3 months.   Loralie Champagne  05/09/2015

## 2015-05-09 NOTE — Patient Instructions (Signed)
Decrease Amlodipine to 5mg  daily.   Increase Hydralazine to 75mg  three times daily.  Increase IMDUR to 30mg  three times daily.  Routine lab work today. (bmet bnp) Will notify you of abnormal results  Follow up with Dr.McLean in 3 months

## 2015-05-11 DIAGNOSIS — M109 Gout, unspecified: Secondary | ICD-10-CM | POA: Diagnosis not present

## 2015-05-24 ENCOUNTER — Telehealth (HOSPITAL_COMMUNITY): Payer: Self-pay | Admitting: Pharmacist

## 2015-05-24 MED ORDER — ISOSORBIDE MONONITRATE ER 60 MG PO TB24: 90.0000 mg | ORAL_TABLET | Freq: Every day | ORAL | Status: DC

## 2015-05-24 NOTE — Telephone Encounter (Signed)
Katie with paramedicine called to state that Mr. Grahn cannot afford his $10 copay for isosorbide dinitrate. Have switched him to an equivalent dose of isosorbide mononitrate which is $0.90 with his insurance.   Ruta Hinds. Velva Harman, PharmD, BCPS, CPP Clinical Pharmacist Pager: [phone] Phone: [phone] 05/24/2015 2:21 PM

## 2015-06-03 DIAGNOSIS — N5201 Erectile dysfunction due to arterial insufficiency: Secondary | ICD-10-CM | POA: Diagnosis not present

## 2015-06-03 DIAGNOSIS — Z0000 Encounter for general adult medical examination without abnormal findings: Secondary | ICD-10-CM | POA: Diagnosis not present

## 2015-06-03 DIAGNOSIS — F524 Premature ejaculation: Secondary | ICD-10-CM | POA: Diagnosis not present

## 2015-06-20 DIAGNOSIS — N5201 Erectile dysfunction due to arterial insufficiency: Secondary | ICD-10-CM | POA: Diagnosis not present

## 2015-06-22 DIAGNOSIS — Z0000 Encounter for general adult medical examination without abnormal findings: Secondary | ICD-10-CM | POA: Diagnosis not present

## 2015-06-22 DIAGNOSIS — N5201 Erectile dysfunction due to arterial insufficiency: Secondary | ICD-10-CM | POA: Diagnosis not present
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
## 2015-06-29 ENCOUNTER — Encounter: Payer: Self-pay | Admitting: Cardiology

## 2015-06-29 ENCOUNTER — Ambulatory Visit (INDEPENDENT_AMBULATORY_CARE_PROVIDER_SITE_OTHER): Payer: Medicare Other | Admitting: Cardiology

## 2015-06-29 VITALS — BP 110/68 | HR 60 | Ht 67.5 in | Wt 188.6 lb

## 2015-06-29 DIAGNOSIS — I5042 Chronic combined systolic (congestive) and diastolic (congestive) heart failure: Secondary | ICD-10-CM

## 2015-06-29 DIAGNOSIS — I255 Ischemic cardiomyopathy: Secondary | ICD-10-CM

## 2015-06-29 LAB — CUP PACEART INCLINIC DEVICE CHECK
Battery Remaining Longevity: 110 mo
Battery Voltage: 3.06 V
Brady Statistic AP VP Percent: 11.45 %
Brady Statistic AS VS Percent: 2.05 %
Brady Statistic RA Percent Paced: 11.64 %
Brady Statistic RV Percent Paced: 13.17 %
Date Time Interrogation Session: 20170419113708
HIGH POWER IMPEDANCE MEASURED VALUE: 61 Ohm
Implantable Lead Implant Date: 20140207
Implantable Lead Implant Date: 20170105
Implantable Lead Location: 753859
Implantable Lead Model: 4298
Implantable Lead Model: 5076
Lead Channel Impedance Value: 342 Ohm
Lead Channel Impedance Value: 361 Ohm
Lead Channel Impedance Value: 361 Ohm
Lead Channel Impedance Value: 361 Ohm
Lead Channel Impedance Value: 475 Ohm
Lead Channel Impedance Value: 532 Ohm
Lead Channel Impedance Value: 589 Ohm
Lead Channel Pacing Threshold Amplitude: 0.75 V
Lead Channel Pacing Threshold Amplitude: 1 V
Lead Channel Pacing Threshold Pulse Width: 0.4 ms
Lead Channel Sensing Intrinsic Amplitude: 17.625 mV
Lead Channel Setting Pacing Amplitude: 1.5 V
Lead Channel Setting Pacing Pulse Width: 0.4 ms
Lead Channel Setting Sensing Sensitivity: 0.3 mV
MDC IDC LEAD IMPLANT DT: 20140207
MDC IDC LEAD LOCATION: 753858
MDC IDC LEAD LOCATION: 753860
MDC IDC LEAD MODEL: 6935
MDC IDC MSMT LEADCHNL LV IMPEDANCE VALUE: 247 Ohm
MDC IDC MSMT LEADCHNL LV IMPEDANCE VALUE: 418 Ohm
MDC IDC MSMT LEADCHNL LV IMPEDANCE VALUE: 551 Ohm
MDC IDC MSMT LEADCHNL LV IMPEDANCE VALUE: 608 Ohm
MDC IDC MSMT LEADCHNL LV IMPEDANCE VALUE: 665 Ohm
MDC IDC MSMT LEADCHNL LV PACING THRESHOLD PULSEWIDTH: 0.4 ms
MDC IDC MSMT LEADCHNL RA PACING THRESHOLD AMPLITUDE: 0.75 V
MDC IDC MSMT LEADCHNL RA SENSING INTR AMPL: 2 mV
MDC IDC MSMT LEADCHNL RV IMPEDANCE VALUE: 399 Ohm
MDC IDC MSMT LEADCHNL RV PACING THRESHOLD PULSEWIDTH: 0.4 ms
MDC IDC SET LEADCHNL LV PACING AMPLITUDE: 2.25 V
MDC IDC STAT BRADY AP VS PERCENT: 0.19 %
MDC IDC STAT BRADY AS VP PERCENT: 86.3 %

## 2015-06-29 NOTE — Patient Instructions (Signed)
Medication Instructions:  Your physician recommends that you continue on your current medications as directed. Please refer to the Current Medication list given to you today.  Labwork: None ordered  Testing/Procedures: None ordered  Follow-Up: Remote monitoring is used to monitor your Pacemaker of ICD from home. This monitoring reduces the number of office visits required to check your device to one time per year. It allows [place] to keep an eye on the functioning of your device to ensure it is working properly. You are scheduled for a device check from home on 09/28/2015. You may send your transmission at any time that day. If you have a wireless device, the transmission will be sent automatically. After your physician reviews your transmission, you will receive a postcard with your next transmission date.  Your physician wants you to follow-up in: 9 months with Dr. Curt Bears. You will receive a reminder letter in the mail two months in advance. If you don't receive a letter, please call our office to schedule the follow-up appointment.  If you need a refill on your cardiac medications before your next appointment, please call your pharmacy.  Thank you for choosing [facility]!!   Trinidad Curet, RN [phone]

## 2015-07-04 ENCOUNTER — Ambulatory Visit
Admission: RE | Admit: 2015-07-04 | Discharge: 2015-07-04 | Disposition: A | Discharge status: Home / Self Care | Payer: Medicare Other | Source: Ambulatory Visit | Attending: Internal Medicine | Admitting: Internal Medicine

## 2015-07-04 ENCOUNTER — Other Ambulatory Visit: Payer: Self-pay | Admitting: Internal Medicine

## 2015-07-04 DIAGNOSIS — M25562 Pain in left knee: Secondary | ICD-10-CM | POA: Diagnosis not present

## 2015-07-04 NOTE — Progress Notes (Signed)
Discussed follow up with Dr. Sallyanne Kuster and he is happy to continue to follow patient and his device. Recall for 9 months placed in EPIC for Dr. Laqueta Linden patient and informed him of future follow up with Dr. Loletha Grayer.   Patient is agreeable to plan.

## 2015-07-07 DIAGNOSIS — N5201 Erectile dysfunction due to arterial insufficiency: Secondary | ICD-10-CM | POA: Diagnosis not present

## 2015-07-19 DIAGNOSIS — I10 Essential (primary) hypertension: Secondary | ICD-10-CM | POA: Diagnosis not present

## 2015-07-19 DIAGNOSIS — M1990 Unspecified osteoarthritis, unspecified site: Secondary | ICD-10-CM | POA: Diagnosis not present

## 2015-07-19 DIAGNOSIS — Z7984 Long term (current) use of oral hypoglycemic drugs: Secondary | ICD-10-CM | POA: Diagnosis not present

## 2015-07-19 DIAGNOSIS — E1122 Type 2 diabetes mellitus with diabetic chronic kidney disease: Secondary | ICD-10-CM | POA: Diagnosis not present

## 2015-07-19 DIAGNOSIS — I5032 Chronic diastolic (congestive) heart failure: Secondary | ICD-10-CM | POA: Diagnosis not present

## 2015-07-19 DIAGNOSIS — E782 Mixed hyperlipidemia: Secondary | ICD-10-CM | POA: Diagnosis not present

## 2015-07-19 DIAGNOSIS — I509 Heart failure, unspecified: Secondary | ICD-10-CM | POA: Diagnosis not present

## 2015-07-19 DIAGNOSIS — Z125 Encounter for screening for malignant neoplasm of prostate: Secondary | ICD-10-CM | POA: Diagnosis not present

## 2015-07-19 DIAGNOSIS — Z0000 Encounter for general adult medical examination without abnormal findings: Secondary | ICD-10-CM | POA: Diagnosis not present

## 2015-07-19 DIAGNOSIS — M109 Gout, unspecified: Secondary | ICD-10-CM | POA: Diagnosis not present

## 2015-07-19 DIAGNOSIS — Z1389 Encounter for screening for other disorder: Secondary | ICD-10-CM | POA: Diagnosis not present

## 2015-07-20 ENCOUNTER — Other Ambulatory Visit: Payer: Self-pay | Admitting: Urology

## 2015-07-21 ENCOUNTER — Other Ambulatory Visit (HOSPITAL_COMMUNITY): Payer: Self-pay | Admitting: Cardiology

## 2015-07-22 ENCOUNTER — Telehealth (HOSPITAL_COMMUNITY): Payer: Self-pay | Admitting: *Deleted

## 2015-07-22 NOTE — Telephone Encounter (Signed)
Received note from [facility], pt needs clearance for surgery and ok to be off Plavix for placement of inflatable penile prosthesis.  Per Dr Aundra Dubin Madaline Brilliant to hold Plavix 5 days prior, restart after continue ASA" note faxed back to urology at [phone]

## 2015-07-26 ENCOUNTER — Ambulatory Visit: Payer: Medicare Other | Admitting: Cardiology

## 2015-08-09 ENCOUNTER — Encounter (HOSPITAL_COMMUNITY): Payer: Medicare Other

## 2015-08-12 ENCOUNTER — Encounter: Payer: Self-pay | Admitting: Cardiology

## 2015-08-12 DIAGNOSIS — N5201 Erectile dysfunction due to arterial insufficiency: Secondary | ICD-10-CM | POA: Diagnosis not present

## 2015-08-16 ENCOUNTER — Other Ambulatory Visit: Payer: Self-pay | Admitting: Internal Medicine

## 2015-08-16 DIAGNOSIS — M7120 Synovial cyst of popliteal space [Baker], unspecified knee: Secondary | ICD-10-CM | POA: Diagnosis not present

## 2015-08-16 DIAGNOSIS — M25562 Pain in left knee: Secondary | ICD-10-CM | POA: Diagnosis not present

## 2015-08-16 DIAGNOSIS — M7122 Synovial cyst of popliteal space [Baker], left knee: Secondary | ICD-10-CM

## 2015-08-16 DIAGNOSIS — I82402 Acute embolism and thrombosis of unspecified deep veins of left lower extremity: Secondary | ICD-10-CM

## 2015-08-17 ENCOUNTER — Ambulatory Visit
Admission: RE | Admit: 2015-08-17 | Discharge: 2015-08-17 | Disposition: A | Discharge status: Home / Self Care | Payer: Medicare Other | Source: Ambulatory Visit | Attending: Internal Medicine | Admitting: Internal Medicine

## 2015-08-17 DIAGNOSIS — M25562 Pain in left knee: Secondary | ICD-10-CM | POA: Diagnosis not present

## 2015-08-17 DIAGNOSIS — I82402 Acute embolism and thrombosis of unspecified deep veins of left lower extremity: Secondary | ICD-10-CM

## 2015-08-17 DIAGNOSIS — M7122 Synovial cyst of popliteal space [Baker], left knee: Secondary | ICD-10-CM

## 2015-08-18 NOTE — Progress Notes (Addendum)
HEMAGLOBIN A1C 07-19-15 [place] ON CHART LOV DR HUSAIN 07-19-15 ON CHART CHEST XRAY 03-31-15 EPIC DEVICE CHECK NOTE 06-29-15 EKG 06-29-15 EPIC ECHO 11-23-14 EPIC LOV DR CARMNITZ 06-29-15 ON CHART CARDIAC CLEARANCE TELEPHONE  NOTE 07-22-15 EPIC DR CARMINTZ ICD ORDERS ON CHART

## 2015-08-18 NOTE — Patient Instructions (Addendum)
Shuhei Balsamo  08/18/2015   Your procedure is scheduled on: 08-29-15  Report to [facility]  Entrance take [facility]  elevators to 3rd floor to  [address] at 1240 pm  Call this number if you have problems the morning of surgery [phone]   Remember: ONLY 1 PERSON MAY GO WITH YOU TO SHORT STAY TO GET  READY MORNING OF YOUR SURGERY.  Do not eat food  :After Midnight, MAY HAVE CLEAR LIQUIDS FROM MIDNIGHT UNTIL 940 AM DAY OF SURGERY, NOTHING BY MOUTH AFTER 940 AM DAY OF SURGERY.     Take these medicines the morning of surgery with A SIP OF WATER: AMLODIPINE (NORVASC), STORVASTATIN (LIPITOR), CARVEDILIL(COREG),OXYCODONE IF NEEDED DO NOT TAKE ANY DIABETIC MEDICATIONS DAY OF YOUR SURGERY                               You may not have any metal on your body including hair pins and              piercings  Do not wear jewelry, make-up, lotions, powders or perfumes, deodorant             Do not wear nail polish.  Do not shave  48 hours prior to surgery.              Men may shave face and neck.   Do not bring valuables to the hospital. [place].  Contacts, dentures or bridgework may not be worn into surgery.  Leave suitcase in the car. After surgery it may be brought to your room.   Coughing and deep breathing exercises, leg exercises               Please read over the following fact sheets you were given: _____________________________________________________________________                CLEAR LIQUID DIET   Foods Allowed                                                                     Foods Excluded  Coffee and tea, regular and decaf                             liquids that you cannot  Plain Jell-O in any flavor                                             see through such as: Fruit ices (not with fruit pulp)                                     milk, soups, orange juice  Iced Popsicles  All solid food Carbonated beverages, regular and diet                                    Cranberry, grape and apple juices Sports drinks like Gatorade Lightly seasoned clear broth or consume(fat free) Sugar, honey syrup  Sample Menu Breakfast                                Lunch                                     Supper Cranberry juice                    Beef broth                            Chicken broth Jell-O                                     Grape juice                           Apple juice Coffee or tea                        Jell-O                                      Popsicle                                                Coffee or tea                        Coffee or tea  _____________________________________________________________________  [facility] - Preparing for Surgery Before surgery, you can play an important role.  Because skin is not sterile, your skin needs to be as free of germs as possible.  You can reduce the number of germs on your skin by washing with CHG (chlorahexidine gluconate) soap before surgery.  CHG is an antiseptic cleaner which kills germs and bonds with the skin to continue killing germs even after washing. Please DO NOT use if you have an allergy to CHG or antibacterial soaps.  If your skin becomes reddened/irritated stop using the CHG and inform your nurse when you arrive at Short Stay. Do not shave (including legs and underarms) for at least 48 hours prior to the first CHG shower.  You may shave your face/neck. Please follow these instructions carefully:  1.  Shower with CHG Soap the night before surgery and the  morning of Surgery.  2.  If you choose to wash your hair, wash your hair first as usual with your  normal  shampoo.  3.  After you shampoo, rinse your hair and body thoroughly to remove the  shampoo.  4.  Use CHG as you would any other liquid soap.  You can apply chg directly  to the skin and wash                        Gently with a scrungie or clean washcloth.  5.  Apply the CHG Soap to your body ONLY FROM THE NECK DOWN.   Do not use on face/ open                           Wound or open sores. Avoid contact with eyes, ears mouth and genitals (private parts).                       Wash face,  Genitals (private parts) with your normal soap.             6.  Wash thoroughly, paying special attention to the area where your surgery  will be performed.  7.  Thoroughly rinse your body with warm water from the neck down.  8.  DO NOT shower/wash with your normal soap after using and rinsing off  the CHG Soap.                9.  Pat yourself dry with a clean towel.            10.  Wear clean pajamas.            11.  Place clean sheets on your bed the night of your first shower and do not  sleep with pets. Day of Surgery : Do not apply any lotions/deodorants the morning of surgery.  Please wear clean clothes to the hospital/surgery center.  FAILURE TO FOLLOW THESE INSTRUCTIONS MAY RESULT IN THE CANCELLATION OF YOUR SURGERY PATIENT SIGNATURE_________________________________  NURSE SIGNATURE__________________________________  ________________________________________________________________________

## 2015-08-22 ENCOUNTER — Encounter (HOSPITAL_COMMUNITY)
Admission: RE | Admit: 2015-08-22 | Discharge: 2015-08-22 | Disposition: A | Discharge status: Home / Self Care | Payer: Medicare Other | Source: Ambulatory Visit | Attending: Urology | Admitting: Urology

## 2015-08-22 ENCOUNTER — Emergency Department (HOSPITAL_COMMUNITY)
Admission: EM | Admit: 2015-08-22 | Discharge: 2015-08-22 | Disposition: A | Discharge status: Home / Self Care | Payer: Medicare Other | Attending: Emergency Medicine | Admitting: Emergency Medicine

## 2015-08-22 ENCOUNTER — Encounter (HOSPITAL_COMMUNITY): Payer: Self-pay | Admitting: *Deleted

## 2015-08-22 ENCOUNTER — Encounter (HOSPITAL_COMMUNITY): Payer: Self-pay

## 2015-08-22 DIAGNOSIS — Z79899 Other long term (current) drug therapy: Secondary | ICD-10-CM | POA: Insufficient documentation

## 2015-08-22 DIAGNOSIS — Z951 Presence of aortocoronary bypass graft: Secondary | ICD-10-CM | POA: Diagnosis not present

## 2015-08-22 DIAGNOSIS — Z87891 Personal history of nicotine dependence: Secondary | ICD-10-CM | POA: Insufficient documentation

## 2015-08-22 DIAGNOSIS — I252 Old myocardial infarction: Secondary | ICD-10-CM | POA: Insufficient documentation

## 2015-08-22 DIAGNOSIS — E119 Type 2 diabetes mellitus without complications: Secondary | ICD-10-CM | POA: Diagnosis not present

## 2015-08-22 DIAGNOSIS — Z7982 Long term (current) use of aspirin: Secondary | ICD-10-CM | POA: Insufficient documentation

## 2015-08-22 DIAGNOSIS — Z95810 Presence of automatic (implantable) cardiac defibrillator: Secondary | ICD-10-CM | POA: Diagnosis not present

## 2015-08-22 DIAGNOSIS — M25562 Pain in left knee: Secondary | ICD-10-CM | POA: Diagnosis not present

## 2015-08-22 DIAGNOSIS — I130 Hypertensive heart and chronic kidney disease with heart failure and stage 1 through stage 4 chronic kidney disease, or unspecified chronic kidney disease: Secondary | ICD-10-CM | POA: Diagnosis not present

## 2015-08-22 DIAGNOSIS — M1990 Unspecified osteoarthritis, unspecified site: Secondary | ICD-10-CM | POA: Insufficient documentation

## 2015-08-22 DIAGNOSIS — N529 Male erectile dysfunction, unspecified: Secondary | ICD-10-CM | POA: Insufficient documentation

## 2015-08-22 DIAGNOSIS — Z01812 Encounter for preprocedural laboratory examination: Secondary | ICD-10-CM | POA: Diagnosis not present

## 2015-08-22 DIAGNOSIS — I2510 Atherosclerotic heart disease of native coronary artery without angina pectoris: Secondary | ICD-10-CM | POA: Diagnosis not present

## 2015-08-22 DIAGNOSIS — I5040 Unspecified combined systolic (congestive) and diastolic (congestive) heart failure: Secondary | ICD-10-CM | POA: Insufficient documentation

## 2015-08-22 DIAGNOSIS — N183 Chronic kidney disease, stage 3 (moderate): Secondary | ICD-10-CM | POA: Diagnosis not present

## 2015-08-22 LAB — BASIC METABOLIC PANEL
Anion gap: 7 (ref 5–15)
BUN: 62 mg/dL — AB (ref 6–20)
CALCIUM: 9.9 mg/dL (ref 8.9–10.3)
CO2: 26 mmol/L (ref 22–32)
CREATININE: 2.7 mg/dL — AB (ref 0.61–1.24)
Chloride: 108 mmol/L (ref 101–111)
GFR calc Af Amer: 26 mL/min — ABNORMAL LOW (ref >60)
GFR, EST NON AFRICAN AMERICAN: 23 mL/min — AB (ref >60)
Glucose, Bld: 171 mg/dL — ABNORMAL HIGH (ref 65–99)
Potassium: 4.2 mmol/L (ref 3.5–5.1)
SODIUM: 141 mmol/L (ref 135–145)

## 2015-08-22 LAB — CBC
HCT: 36.1 % — ABNORMAL LOW (ref 39.0–52.0)
Hemoglobin: 11.8 g/dL — ABNORMAL LOW (ref 13.0–17.0)
MCH: 29.6 pg (ref 26.0–34.0)
MCHC: 32.7 g/dL (ref 30.0–36.0)
MCV: 90.7 fL (ref 78.0–100.0)
PLATELETS: 141 10*3/uL — AB (ref 150–400)
RBC: 3.98 MIL/uL — ABNORMAL LOW (ref 4.22–5.81)
RDW: 13.5 % (ref 11.5–15.5)
WBC: 4.2 10*3/uL (ref 4.0–10.5)

## 2015-08-22 MED ORDER — TRIAMCINOLONE ACETONIDE 40 MG/ML IJ SUSP
40.0000 mg | Freq: Once | INTRAMUSCULAR | Status: AC
  Administered 2015-08-22: 40 mg via INTRA_ARTICULAR
  Filled 2015-08-22: qty 1

## 2015-08-22 NOTE — Progress Notes (Signed)
CBC & BMP lab results from preop appointment on 08-22-15 faxed to Dr. Nicolette Bang via EPIC.

## 2015-08-22 NOTE — Progress Notes (Addendum)
08-22-15 - preop appointment - Pt. Stated that he has not taken any diabetic medications for three years.  HgbA1C on 07-19-15 was 6.4 (last office visit at [facility]).  He also stated that he is not taking isosorbide mononitrate (Imdur), glimepiride and torsemide (these medications are listed on last office visit note from 07-19-15 with Dr. Lysle Rubens at [facility])   This progress note was faxed to Dr. Nicolette Bang via  [facility]

## 2015-08-22 NOTE — ED Notes (Addendum)
Pt reports L knee pain and slight swelling, pain is with weight bearing x 3 days.  Pt reports he has had a steroid injection which helped his pain at [facility] ED in the past.
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
## 2015-08-22 NOTE — ED Notes (Signed)
Marisa Severin EDPA okayed to evaluate pt in FT at this time.

## 2015-08-28 MED ORDER — GENTAMICIN SULFATE 40 MG/ML IJ SOLN
1.5000 mg/kg | INTRAVENOUS | Status: AC
  Administered 2015-08-29: 110 mg via INTRAVENOUS
  Filled 2015-08-28: qty 2.75

## 2015-08-29 ENCOUNTER — Ambulatory Visit (HOSPITAL_COMMUNITY): Payer: Medicare Other | Admitting: Certified Registered"

## 2015-08-29 ENCOUNTER — Observation Stay (HOSPITAL_COMMUNITY)
Admission: RE | Admit: 2015-08-29 | Discharge: 2015-08-30 | Disposition: A | Discharge status: Home / Self Care | Payer: Medicare Other | Source: Ambulatory Visit | Attending: Urology | Admitting: Urology

## 2015-08-29 ENCOUNTER — Encounter (HOSPITAL_COMMUNITY)
Admission: RE | Disposition: A | Discharge status: Home / Self Care | Payer: Self-pay | Source: Ambulatory Visit | Attending: Urology

## 2015-08-29 ENCOUNTER — Encounter (HOSPITAL_COMMUNITY): Payer: Self-pay | Admitting: *Deleted

## 2015-08-29 DIAGNOSIS — I509 Heart failure, unspecified: Secondary | ICD-10-CM | POA: Diagnosis not present

## 2015-08-29 DIAGNOSIS — N529 Male erectile dysfunction, unspecified: Secondary | ICD-10-CM | POA: Diagnosis not present

## 2015-08-29 DIAGNOSIS — J449 Chronic obstructive pulmonary disease, unspecified: Secondary | ICD-10-CM | POA: Diagnosis not present

## 2015-08-29 DIAGNOSIS — I110 Hypertensive heart disease with heart failure: Secondary | ICD-10-CM | POA: Insufficient documentation

## 2015-08-29 DIAGNOSIS — Z87891 Personal history of nicotine dependence: Secondary | ICD-10-CM | POA: Diagnosis not present

## 2015-08-29 DIAGNOSIS — Z7902 Long term (current) use of antithrombotics/antiplatelets: Secondary | ICD-10-CM | POA: Insufficient documentation

## 2015-08-29 DIAGNOSIS — Z951 Presence of aortocoronary bypass graft: Secondary | ICD-10-CM | POA: Diagnosis not present

## 2015-08-29 DIAGNOSIS — I2510 Atherosclerotic heart disease of native coronary artery without angina pectoris: Secondary | ICD-10-CM | POA: Diagnosis not present

## 2015-08-29 DIAGNOSIS — Z79899 Other long term (current) drug therapy: Secondary | ICD-10-CM | POA: Diagnosis not present

## 2015-08-29 DIAGNOSIS — I252 Old myocardial infarction: Secondary | ICD-10-CM | POA: Insufficient documentation

## 2015-08-29 DIAGNOSIS — E785 Hyperlipidemia, unspecified: Secondary | ICD-10-CM | POA: Insufficient documentation

## 2015-08-29 DIAGNOSIS — Z95810 Presence of automatic (implantable) cardiac defibrillator: Secondary | ICD-10-CM | POA: Diagnosis not present

## 2015-08-29 DIAGNOSIS — Z7984 Long term (current) use of oral hypoglycemic drugs: Secondary | ICD-10-CM | POA: Diagnosis not present

## 2015-08-29 DIAGNOSIS — E119 Type 2 diabetes mellitus without complications: Secondary | ICD-10-CM | POA: Diagnosis not present

## 2015-08-29 DIAGNOSIS — N183 Chronic kidney disease, stage 3 (moderate): Secondary | ICD-10-CM | POA: Diagnosis not present

## 2015-08-29 DIAGNOSIS — Z7982 Long term (current) use of aspirin: Secondary | ICD-10-CM | POA: Diagnosis not present

## 2015-08-29 DIAGNOSIS — N5201 Erectile dysfunction due to arterial insufficiency: Secondary | ICD-10-CM | POA: Diagnosis not present

## 2015-08-29 DIAGNOSIS — I129 Hypertensive chronic kidney disease with stage 1 through stage 4 chronic kidney disease, or unspecified chronic kidney disease: Secondary | ICD-10-CM | POA: Diagnosis not present

## 2015-08-29 HISTORY — PX: PENILE PROSTHESIS IMPLANT: SHX240

## 2015-08-29 LAB — GLUCOSE, CAPILLARY
GLUCOSE-CAPILLARY: 106 mg/dL — AB (ref 65–99)
GLUCOSE-CAPILLARY: 102 mg/dL — AB (ref 65–99)
Glucose-Capillary: 106 mg/dL — ABNORMAL HIGH (ref 65–99)

## 2015-08-29 SURGERY — INSERTION, PENILE PROSTHESIS, INFLATABLE
Anesthesia: General | Site: Penis

## 2015-08-29 MED ORDER — ATORVASTATIN CALCIUM 40 MG PO TABS
40.0000 mg | ORAL_TABLET | Freq: Every day | ORAL | Status: DC
Start: 2015-08-30 — End: 2015-08-30
  Administered 2015-08-30: 40 mg via ORAL
  Filled 2015-08-29: qty 1

## 2015-08-29 MED ORDER — METOCLOPRAMIDE HCL 5 MG/ML IJ SOLN: 10.0000 mg | Freq: Once | INTRAMUSCULAR | Status: DC | PRN

## 2015-08-29 MED ORDER — CARVEDILOL 12.5 MG PO TABS
12.5000 mg | ORAL_TABLET | Freq: Two times a day (BID) | ORAL | Status: DC
  Administered 2015-08-29 – 2015-08-30 (×2): 12.5 mg via ORAL
  Filled 2015-08-29 (×2): qty 1

## 2015-08-29 MED ORDER — PROPOFOL 10 MG/ML IV BOLUS
INTRAVENOUS | Status: DC | PRN
  Administered 2015-08-29: 70 mg via INTRAVENOUS

## 2015-08-29 MED ORDER — PROPOFOL 10 MG/ML IV BOLUS
INTRAVENOUS | Status: AC
  Filled 2015-08-29: qty 20

## 2015-08-29 MED ORDER — STERILE WATER FOR IRRIGATION IR SOLN
Status: DC | PRN
  Administered 2015-08-29: 10 mL

## 2015-08-29 MED ORDER — MIDAZOLAM HCL 5 MG/5ML IJ SOLN
INTRAMUSCULAR | Status: DC | PRN
  Administered 2015-08-29: 2 mg via INTRAVENOUS

## 2015-08-29 MED ORDER — HYDROMORPHONE HCL 1 MG/ML IJ SOLN
INTRAMUSCULAR | Status: AC
  Administered 2015-08-29: 0.5 mg via INTRAVENOUS
  Filled 2015-08-29: qty 1

## 2015-08-29 MED ORDER — SODIUM CHLORIDE 0.9 % IR SOLN
Status: AC
  Filled 2015-08-29: qty 1

## 2015-08-29 MED ORDER — HYDRALAZINE HCL 50 MG PO TABS
75.0000 mg | ORAL_TABLET | Freq: Three times a day (TID) | ORAL | Status: DC
  Administered 2015-08-29 – 2015-08-30 (×2): 75 mg via ORAL
  Filled 2015-08-29 (×2): qty 1

## 2015-08-29 MED ORDER — MIDAZOLAM HCL 2 MG/2ML IJ SOLN
INTRAMUSCULAR | Status: AC
  Filled 2015-08-29: qty 2

## 2015-08-29 MED ORDER — POTASSIUM CHLORIDE CRYS ER 20 MEQ PO TBCR
20.0000 meq | EXTENDED_RELEASE_TABLET | Freq: Every day | ORAL | Status: DC
  Administered 2015-08-30: 20 meq via ORAL
  Filled 2015-08-29: qty 1

## 2015-08-29 MED ORDER — CARVEDILOL 12.5 MG PO TABS
12.5000 mg | ORAL_TABLET | Freq: Once | ORAL | Status: AC
  Administered 2015-08-29: 12.5 mg via ORAL
  Filled 2015-08-29: qty 1

## 2015-08-29 MED ORDER — LACTATED RINGERS IV SOLN
INTRAVENOUS | Status: DC
  Administered 2015-08-29 (×2): via INTRAVENOUS

## 2015-08-29 MED ORDER — MORPHINE SULFATE (PF) 10 MG/ML IV SOLN: 2.0000 mg | INTRAVENOUS | Status: DC | PRN

## 2015-08-29 MED ORDER — HYDROMORPHONE HCL 1 MG/ML IJ SOLN: 0.5000 mg | INTRAMUSCULAR | Status: DC | PRN

## 2015-08-29 MED ORDER — FENTANYL CITRATE (PF) 100 MCG/2ML IJ SOLN
INTRAMUSCULAR | Status: DC | PRN
  Administered 2015-08-29: 25 ug via INTRAVENOUS
  Administered 2015-08-29 (×2): 50 ug via INTRAVENOUS
  Administered 2015-08-29 (×2): 25 ug via INTRAVENOUS
  Administered 2015-08-29: 50 ug via INTRAVENOUS
  Administered 2015-08-29: 25 ug via INTRAVENOUS

## 2015-08-29 MED ORDER — FENTANYL CITRATE (PF) 250 MCG/5ML IJ SOLN
INTRAMUSCULAR | Status: AC
  Filled 2015-08-29: qty 5

## 2015-08-29 MED ORDER — GENTAMICIN SULFATE 40 MG/ML IJ SOLN
360.0000 mg | Freq: Once | INTRAMUSCULAR | Status: AC
  Administered 2015-08-30: 360 mg via INTRAVENOUS
  Filled 2015-08-29: qty 9

## 2015-08-29 MED ORDER — ETOMIDATE 2 MG/ML IV SOLN
INTRAVENOUS | Status: DC | PRN
  Administered 2015-08-29: 14 mg via INTRAVENOUS

## 2015-08-29 MED ORDER — SODIUM CHLORIDE 0.9% FLUSH
3.0000 mL | Freq: Two times a day (BID) | INTRAVENOUS | Status: DC
  Administered 2015-08-30: 3 mL via INTRAVENOUS

## 2015-08-29 MED ORDER — BELLADONNA ALKALOIDS-OPIUM 16.2-60 MG RE SUPP
1.0000 | Freq: Once | RECTAL | Status: AC
  Administered 2015-08-29: 1 via RECTAL

## 2015-08-29 MED ORDER — ONDANSETRON HCL 4 MG/2ML IJ SOLN
INTRAMUSCULAR | Status: DC | PRN
  Administered 2015-08-29: 4 mg via INTRAVENOUS

## 2015-08-29 MED ORDER — FENTANYL CITRATE (PF) 100 MCG/2ML IJ SOLN
INTRAMUSCULAR | Status: AC
  Administered 2015-08-29: 50 ug via INTRAVENOUS
  Filled 2015-08-29: qty 2

## 2015-08-29 MED ORDER — AMLODIPINE BESYLATE 5 MG PO TABS
5.0000 mg | ORAL_TABLET | Freq: Every day | ORAL | Status: DC
  Administered 2015-08-30: 5 mg via ORAL
  Filled 2015-08-29: qty 1

## 2015-08-29 MED ORDER — FENTANYL CITRATE (PF) 100 MCG/2ML IJ SOLN
25.0000 ug | INTRAMUSCULAR | Status: DC | PRN
  Administered 2015-08-29: 50 ug via INTRAVENOUS
  Administered 2015-08-29: 25 ug via INTRAVENOUS
  Administered 2015-08-29: 50 ug via INTRAVENOUS

## 2015-08-29 MED ORDER — COLCHICINE 0.6 MG PO TABS: 0.6000 mg | ORAL_TABLET | Freq: Every day | ORAL | Status: DC | PRN

## 2015-08-29 MED ORDER — OXYCODONE HCL 5 MG PO TABS
5.0000 mg | ORAL_TABLET | ORAL | Status: DC | PRN
  Administered 2015-08-30: 5 mg via ORAL
  Filled 2015-08-29: qty 1

## 2015-08-29 MED ORDER — SENNOSIDES-DOCUSATE SODIUM 8.6-50 MG PO TABS
2.0000 | ORAL_TABLET | Freq: Every day | ORAL | Status: DC
  Administered 2015-08-29: 2 via ORAL
  Filled 2015-08-29: qty 2

## 2015-08-29 MED ORDER — DIPHENHYDRAMINE HCL 12.5 MG/5ML PO ELIX
12.5000 mg | ORAL_SOLUTION | Freq: Four times a day (QID) | ORAL | Status: DC | PRN
Start: 2015-08-29 — End: 2015-08-30

## 2015-08-29 MED ORDER — GENTAMICIN SULFATE 40 MG/ML IJ SOLN
360.0000 mg | Freq: Once | INTRAVENOUS | Status: DC
  Filled 2015-08-29: qty 9

## 2015-08-29 MED ORDER — SODIUM CHLORIDE 0.9% FLUSH: 3.0000 mL | INTRAVENOUS | Status: DC | PRN

## 2015-08-29 MED ORDER — BACITRACIN-NEOMYCIN-POLYMYXIN 400-5-5000 EX OINT: 1.0000 "application " | TOPICAL_OINTMENT | Freq: Three times a day (TID) | CUTANEOUS | Status: DC | PRN

## 2015-08-29 MED ORDER — SODIUM CHLORIDE 0.9 % IV SOLN
INTRAVENOUS | Status: DC
  Administered 2015-08-29: 20:00:00 via INTRAVENOUS

## 2015-08-29 MED ORDER — LIDOCAINE HCL (CARDIAC) 20 MG/ML IV SOLN
INTRAVENOUS | Status: DC | PRN
  Administered 2015-08-29: 100 mg via INTRAVENOUS

## 2015-08-29 MED ORDER — ASPIRIN EC 81 MG PO TBEC
81.0000 mg | DELAYED_RELEASE_TABLET | Freq: Every day | ORAL | Status: DC
  Administered 2015-08-30: 81 mg via ORAL
  Filled 2015-08-29: qty 1

## 2015-08-29 MED ORDER — SODIUM CHLORIDE 0.9 % IV SOLN: 250.0000 mL | INTRAVENOUS | Status: DC | PRN

## 2015-08-29 MED ORDER — ALLOPURINOL 100 MG PO TABS: 100.0000 mg | ORAL_TABLET | Freq: Every day | ORAL | Status: DC | PRN

## 2015-08-29 MED ORDER — ACETAMINOPHEN 500 MG PO TABS
1000.0000 mg | ORAL_TABLET | Freq: Four times a day (QID) | ORAL | Status: DC
  Administered 2015-08-29 – 2015-08-30 (×3): 1000 mg via ORAL
  Filled 2015-08-29 (×3): qty 2

## 2015-08-29 MED ORDER — INSULIN ASPART 100 UNIT/ML ~~LOC~~ SOLN: 0.0000 [IU] | Freq: Three times a day (TID) | SUBCUTANEOUS | Status: DC

## 2015-08-29 MED ORDER — ONDANSETRON HCL 4 MG/2ML IJ SOLN
INTRAMUSCULAR | Status: AC
Start: 2015-08-29 — End: 2015-08-29
  Filled 2015-08-29: qty 2

## 2015-08-29 MED ORDER — LIDOCAINE HCL (CARDIAC) 20 MG/ML IV SOLN
INTRAVENOUS | Status: AC
  Filled 2015-08-29: qty 5

## 2015-08-29 MED ORDER — TRAZODONE HCL 150 MG PO TABS
150.0000 mg | ORAL_TABLET | Freq: Every evening | ORAL | Status: DC | PRN
  Administered 2015-08-29: 150 mg via ORAL
  Filled 2015-08-29 (×3): qty 1

## 2015-08-29 MED ORDER — DEXAMETHASONE SODIUM PHOSPHATE 10 MG/ML IJ SOLN
INTRAMUSCULAR | Status: AC
Start: 2015-08-29 — End: 2015-08-29
  Filled 2015-08-29: qty 1

## 2015-08-29 MED ORDER — MEPERIDINE HCL 50 MG/ML IJ SOLN: 6.2500 mg | INTRAMUSCULAR | Status: DC | PRN

## 2015-08-29 MED ORDER — VANCOMYCIN HCL 500 MG IV SOLR
500.0000 mg | Freq: Two times a day (BID) | INTRAVENOUS | Status: AC
  Administered 2015-08-30: 500 mg via INTRAVENOUS
  Filled 2015-08-29: qty 500

## 2015-08-29 MED ORDER — HYDROMORPHONE HCL 1 MG/ML IJ SOLN
0.5000 mg | INTRAMUSCULAR | Status: AC | PRN
  Administered 2015-08-29 (×4): 0.5 mg via INTRAVENOUS

## 2015-08-29 MED ORDER — BELLADONNA ALKALOIDS-OPIUM 16.2-60 MG RE SUPP
RECTAL | Status: AC
  Filled 2015-08-29: qty 1

## 2015-08-29 MED ORDER — SODIUM CHLORIDE 0.9 % IR SOLN
Status: DC | PRN
  Administered 2015-08-29: 500 mL

## 2015-08-29 MED ORDER — VANCOMYCIN HCL 10 G IV SOLR
1500.0000 mg | INTRAVENOUS | Status: AC
  Administered 2015-08-29: 1500 mg via INTRAVENOUS
  Filled 2015-08-29: qty 1500

## 2015-08-29 MED ORDER — DIPHENHYDRAMINE HCL 50 MG/ML IJ SOLN: 12.5000 mg | Freq: Four times a day (QID) | INTRAMUSCULAR | Status: DC | PRN

## 2015-08-29 MED ORDER — ROCURONIUM BROMIDE 100 MG/10ML IV SOLN
INTRAVENOUS | Status: AC
  Filled 2015-08-29: qty 1

## 2015-08-29 MED ORDER — ONDANSETRON HCL 4 MG/2ML IJ SOLN: 4.0000 mg | INTRAMUSCULAR | Status: DC | PRN

## 2015-08-29 SURGICAL SUPPLY — 53 items
BAG URINE DRAINAGE (UROLOGICAL SUPPLIES) ×3 IMPLANT
BANDAGE COBAN STERILE 2 (GAUZE/BANDAGES/DRESSINGS) IMPLANT
BENZOIN TINCTURE PRP APPL 2/3 (GAUZE/BANDAGES/DRESSINGS) ×3 IMPLANT
BLADE HEX COATED 2.75 (ELECTRODE) ×3 IMPLANT
BLADE SURG 15 STRL LF DISP TIS (BLADE) ×2 IMPLANT
BLADE SURG 15 STRL SS (BLADE) ×4
BNDG GAUZE ELAST 4 BULKY (GAUZE/BANDAGES/DRESSINGS) ×3 IMPLANT
CATH FOLEY 2WAY SLVR  5CC 16FR (CATHETERS) ×2
CATH FOLEY 2WAY SLVR 5CC 16FR (CATHETERS) ×1 IMPLANT
CHLORAPREP W/TINT 26ML (MISCELLANEOUS) ×6 IMPLANT
CLOSURE WOUND 1/2 X4 (GAUZE/BANDAGES/DRESSINGS) ×1
COVER MAYO STAND STRL (DRAPES) ×3 IMPLANT
COVER SURGICAL LIGHT HANDLE (MISCELLANEOUS) ×3 IMPLANT
DISSECTOR ROUND CHERRY 3/8 STR (MISCELLANEOUS) ×3 IMPLANT
DRAPE EXTREMITY T 121X128X90 (DRAPE) ×3 IMPLANT
DRAPE INCISE IOBAN 66X45 STRL (DRAPES) ×3 IMPLANT
DRSG TEGADERM 4X4.75 (GAUZE/BANDAGES/DRESSINGS) ×3 IMPLANT
ELECT REM PT RETURN 9FT ADLT (ELECTROSURGICAL) ×3
ELECTRODE REM PT RTRN 9FT ADLT (ELECTROSURGICAL) ×1 IMPLANT
GAUZE SPONGE 4X4 12PLY STRL (GAUZE/BANDAGES/DRESSINGS) ×3 IMPLANT
GLOVE BIOGEL M 8.0 STRL (GLOVE) ×6 IMPLANT
GOWN STRL REUS W/TWL XL LVL3 (GOWN DISPOSABLE) ×3 IMPLANT
IMPL RTE SNAPCONE CX 3.0 (Breast) ×1 IMPLANT
IMPLANT RTE SNAPCONE CX 3.0 (Breast) ×3 IMPLANT
KIT ACCESSORY AMS 700 PUMP (UROLOGICAL SUPPLIES) ×3 IMPLANT
KIT BASIN OR (CUSTOM PROCEDURE TRAY) ×3 IMPLANT
LIQUID BAND (GAUZE/BANDAGES/DRESSINGS) ×3 IMPLANT
NEEDLE HYPO 22GX1.5 SAFETY (NEEDLE) ×3 IMPLANT
NS IRRIG 1000ML POUR BTL (IV SOLUTION) ×3 IMPLANT
PACK GENERAL/GYN (CUSTOM PROCEDURE TRAY) ×3 IMPLANT
PLUG CATH AND CAP STER (CATHETERS) ×3 IMPLANT
PUMP PRECONNECT MS 18 LGX (Miscellaneous) ×1 IMPLANT
PUMP PRECONNECT MS 18CM LGX (Miscellaneous) ×3 IMPLANT
RESERVOIR PENILE 100ML (Miscellaneous) ×3 IMPLANT
RETRACTOR DEEP SCROTAL PENILE (Miscellaneous) ×3 IMPLANT
RETRACTOR WILSON SYSTEM (INSTRUMENTS) ×3 IMPLANT
SCRUB PCMX 4 OZ (MISCELLANEOUS) IMPLANT
SPONGE LAP 4X18 X RAY DECT (DISPOSABLE) IMPLANT
STRIP CLOSURE SKIN 1/2X4 (GAUZE/BANDAGES/DRESSINGS) ×2 IMPLANT
SUPPORT SCROTAL LG STRP (MISCELLANEOUS) ×2 IMPLANT
SUPPORTER ATHLETIC LG (MISCELLANEOUS) ×1
SURGILUBE 3G PEEL PACK STRL (MISCELLANEOUS) ×15 IMPLANT
SUT CHROMIC 3 0 SH 27 (SUTURE) ×6 IMPLANT
SUT MNCRL AB 4-0 PS2 18 (SUTURE) ×3 IMPLANT
SUT PDS AB 2-0 CT2 27 (SUTURE) ×12 IMPLANT
SUT VIC AB 2-0 UR6 27 (SUTURE) ×18 IMPLANT
SUT VIC AB 3-0 SH 27 (SUTURE) ×2
SUT VIC AB 3-0 SH 27X BRD (SUTURE) ×1 IMPLANT
SYR 20CC LL (SYRINGE) ×3 IMPLANT
SYR 50ML LL SCALE MARK (SYRINGE) ×6 IMPLANT
SYR CONTROL 10ML LL (SYRINGE) ×3 IMPLANT
TOWEL OR 17X26 10 PK STRL BLUE (TOWEL DISPOSABLE) ×6 IMPLANT
WATER STERILE IRR 500ML POUR (IV SOLUTION) ×3 IMPLANT
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
## 2015-08-29 NOTE — Progress Notes (Signed)
Pharmacy Antibiotic Note  [name] is a 70 y.o. male admitted on 08/29/2015 for urologic surgery with prosthesis implantation.  Pharmacy has been consulted for gentamicin dosing for surgical prophylaxis.  SCr elevated, but does meet criteria for once-daily dosing.  Received 1.5 mg/kg x1 preoperatively at 1600 today.  Plan: Gentamicin 360 mg IV x1 at midnight tonight  Height: 5\' 8"  (172.7 cm) Weight: 189 lb (85.73 kg) (as per PST on 08/22/15) IBW/kg (Calculated) : 68.4  Temp (24hrs), Avg:97.8 F (36.6 C), Min:97.4 F (36.3 C), Max:98.2 F (36.8 C)  No results for input(s): WBC, CREATININE, LATICACIDVEN, VANCOTROUGH, VANCOPEAK, VANCORANDOM, GENTTROUGH, GENTPEAK, GENTRANDOM, TOBRATROUGH, TOBRAPEAK, TOBRARND, AMIKACINPEAK, AMIKACINTROU, AMIKACIN in the last 168 hours.  Estimated Creatinine Clearance: 27.9 mL/min (by C-G formula based on Cr of 2.7).    No Known Allergies   Thank you for allowing pharmacy to be a part of this patient's care.  Daziyah Cogan A 08/29/2015 7:47 PM

## 2015-08-29 NOTE — Anesthesia Preprocedure Evaluation (Signed)
Anesthesia Evaluation  Patient identified by MRN, date of birth, ID band Patient awake    Reviewed: Allergy & Precautions, NPO status , Patient's Chart, lab work & pertinent test results  Airway Mallampati: II  TM Distance: >3 FB Neck ROM: Full    Dental no notable dental hx. (+) Edentulous Upper   Pulmonary COPD, former smoker,    Pulmonary exam normal breath sounds clear to auscultation       Cardiovascular hypertension, Pt. on medications + CAD, + Past MI, + CABG (2006) and +CHF  Normal cardiovascular exam+ pacemaker + Cardiac Defibrillator  Rhythm:Regular Rate:Normal  EF 30%   Neuro/Psych negative neurological ROS  negative psych ROS   GI/Hepatic negative GI ROS, Neg liver ROS,   Endo/Other  diabetes  Renal/GU Renal InsufficiencyRenal disease  negative genitourinary   Musculoskeletal negative musculoskeletal ROS (+)   Abdominal   Peds negative pediatric ROS (+)  Hematology negative hematology ROS (+)   Anesthesia Other Findings   Reproductive/Obstetrics negative OB ROS                             Anesthesia Physical Anesthesia Plan  ASA: III  Anesthesia Plan: General   Post-op Pain Management:    Induction: Intravenous  Airway Management Planned: Oral ETT and LMA  Additional Equipment:   Intra-op Plan:   Post-operative Plan: Extubation in OR  Informed Consent: I have reviewed the patients History and Physical, chart, labs and discussed the procedure including the risks, benefits and alternatives for the proposed anesthesia with the patient or authorized representative who has indicated his/her understanding and acceptance.   Dental advisory given  Plan Discussed with: CRNA  Anesthesia Plan Comments:         Anesthesia Quick Evaluation

## 2015-08-29 NOTE — Anesthesia Postprocedure Evaluation (Signed)
Anesthesia Post Note  Patient: [name]  Procedure(s) Performed: Procedure(s) (LRB): AMS PENILE PROTHESIS INFLATABLE (N/A)  Patient location during evaluation: PACU Anesthesia Type: General Level of consciousness: awake and alert Pain management: pain level controlled Vital Signs Assessment: post-procedure vital signs reviewed and stable Respiratory status: spontaneous breathing, nonlabored ventilation, respiratory function stable and patient connected to nasal cannula oxygen Cardiovascular status: blood pressure returned to baseline and stable Postop Assessment: no signs of nausea or vomiting Anesthetic complications: no    Last Vitals:  Filed Vitals:   08/29/15 1915 08/29/15 1926  BP: 131/89 138/89  Pulse: 66 76  Temp:  36.3 C  Resp: 19 20    Last Pain:  Filed Vitals:   08/29/15 1928  PainSc: 5                  Montez Hageman

## 2015-08-29 NOTE — Transfer of Care (Signed)
Immediate Anesthesia Transfer of Care Note  Patient: [name]  Procedure(s) Performed: Procedure(s): AMS PENILE PROTHESIS INFLATABLE (N/A)  Patient Location: PACU  Anesthesia Type:General  Level of Consciousness: sedated  Airway & Oxygen Therapy: Patient Spontanous Breathing  Post-op Assessment: Report given to RN and Post -op Vital signs reviewed and stable  Post vital signs: Reviewed and stable  Last Vitals:  Filed Vitals:   08/29/15 1258  BP: 135/59  Pulse: 70  Temp: 36.8 C  Resp: 18    Last Pain: There were no vitals filed for this visit.       Complications: No apparent anesthesia complications

## 2015-08-29 NOTE — Op Note (Signed)
Operative Note:  Pre-operative Diagnosis: Erectile dysfunction  Post-operative Diagnosis: Same  Procedure and Anesthesia:  Procedure(s) and Anesthesia Type:    * AMS PENILE PROTHESIS INFLATABLE - General  Surgeon: Surgeon(s) and Role:    * Cleon Gustin, MD - Primary   Resident:  Acie Fredrickson  EBL: 50 mL  IVF: See anesthesia record  UOP: See anesthesia record  Drains: 16 French foley catheter  Implants:  Implant Name Type Inv. Item Serial No. Manufacturer Lot No. LRB No. Used  AMS Deep Scrotal Retraction System    [employer] [id number] N/A 1  IMPLANT RTE SNAPCONE CX 3.0 - [id number] Breast IMPLANT RTE SNAPCONE CX 3.0  [employer] [id number] N/A 1  RESERVOIR PENILE 100ML - [id number] Miscellaneous RESERVOIR PENILE 100ML  [employer] [id number] N/A 1  PUMP PRECONNECT MS 18CM LGX - [id number] Miscellaneous PUMP PRECONNECT MS 18CM LGX   [employer] [id number] N/A 1    Specimens: None  Complications: None  Indications for Surgery: 70 y.o. male with with erectile dysfunction not responsive to oral or injection medications. Risks, benefits, and alternatives of the above procedure were discussed previously in detail and informed consents was signed and verified.  Findings:  - Corporal measurements: Right 11 cm proximally + 10.5 cm distally; Left 10.5 cm proximally + 10.5 cm distally.  - Successful placement of AMS LGX: Right 18 cm + 3.0 rear tip extender; Left 18 cm with 3 cm rear tip extender.   Procedure Details: The patient and consent was verified in the pre-op holding area and brought to the operating room where they were placed on the operating table. Pre-induction time out was called and general anesthesia was induced. SCDs were placed and IV antibiotics were started (intravenous vancomycin and gentamicin).  The patient was brought back to the operative suite. A 10 minute betadine scrub was performed and he was then  prepped with a chlorapred. The patient was then draped in the usual sterile fashion. A 16 Fr foley catheter was placed per urethra.  The penis was then placed on stretch with the aid of a hook through the meatus attached to the lonestar retractor. A 4 cm penoscrotal incision was made 1 finger breadth above the peno-scrotal junction. The subcutaneous tissues were opened with eletrocautery and the Henry sweep maneuver was performed to clear the tissue off of the corpora bilaterally. Metzenbaum scissors were used to free off the periurethral attachments. Care was taken throughout this time for careful hemostasis. The hooks for the [facility] retractor were placed.   Two centimeter corporotomies were made bilaterally with bovie electrocautery on cutting current. We then used metzenbaum scissors to sharply and bluntly dissect the corporal tissue just within the corporotomy. We then used sequential dilators and dilated the proximal and distal corpora from #9 to #13, bilaterally. There was no resistance as the dilators were passed into the tip of the corpora. There was no evidence of perforation or crossover. We then placed 2 stay sutures on the medial and lateral aspect of the corporal bodies with 2-0 vicryl.    We then utilized a Furlow device and measured him to be 11 cm proximal and 10.5 cm distally on the right and 10.5 cm proximally and 10.5 cm distally on the left.  The location for the reservoir was chosen to be on the patient's left side. We used blunt dissection along the left spermatic cord to create a space past the inguinal canal. We then placed the  reservoir and filled it with 100 cc of normal saline and then a rubber shod was placed on the tubing.   We then turned our attention to placing the cylinders. We placed the proximal end of the right cylinder into the corpora and seated it with the aid of the applicator. We the placed the string attached to the distal end of the cylinder through a Nesco  needle. We then attached the needle to the [facility] and advanced the Furlow through the corporal incision up to the glans. The needle was then advanced through the glans and the string was then secured with a snap. We then turned our attention to the left corpora. A similar technique was used to place the left cylinder. We noted that both cylinders were symmetric and present at the glans level. Once this was complete we then tested the device and noted no leaks and that the cylinders were symmetric and the position of the bilateral distal tips were under the glans. We deflated the device.  Once both cylinders were appropriately seated in the corpora we then tied the stay sutures over the cylinder.  We then proceeded to make a subdartos pocket in the scrotum for the pump. Once this was complete the pump was placed the the pump in the pouch and the neck was then closed with a running 3-0 Vicryl suture. We then turned our attention to connecting the device. The tubing was cut to length and then the locking clips were placed on either end of the tubing. A barrel connector was then placed on on end of the tubing. The tubing was then irrigated with normal saline to ensure no air bubbles in the tubing. The free end of the barrel was then attached to the reservoir tubing and using the crimping tool the barrel was secured to the tubing. We then closed the dartos over the tubing using a 3-0 vicryl in a running fashion. The skin was then closed with 4-0 monocryl in a simple running fashion. Skin glue was then placed over the scrotal incision. We then removed the strings attached to the distal ends of the cylinders and placed skin glue over the incisions in the glans. The device was then cycled to partially erect.  We then placed a scrotal fluff and a scrotal support and this then concluded the procedure which was well tolerated by the patient. The patient was transferred to the PACU in stable condition.  Plan: Patient is to  be admitted overnight for IV antibiotics.  His foley will be removed in the morning and the device will be deactivated. He will followup in 2 weeks for a wound check. He will be discharged with 10 days of antibiotics.  Teaching Physician Attestation: Dr. Alyson Ingles was present and scrubbed for the entirety of the procedure

## 2015-08-30 ENCOUNTER — Encounter (HOSPITAL_COMMUNITY): Payer: Self-pay | Admitting: Urology

## 2015-08-30 DIAGNOSIS — E119 Type 2 diabetes mellitus without complications: Secondary | ICD-10-CM | POA: Diagnosis not present

## 2015-08-30 DIAGNOSIS — J449 Chronic obstructive pulmonary disease, unspecified: Secondary | ICD-10-CM | POA: Diagnosis not present

## 2015-08-30 DIAGNOSIS — N529 Male erectile dysfunction, unspecified: Secondary | ICD-10-CM | POA: Diagnosis not present

## 2015-08-30 DIAGNOSIS — I2510 Atherosclerotic heart disease of native coronary artery without angina pectoris: Secondary | ICD-10-CM | POA: Diagnosis not present

## 2015-08-30 DIAGNOSIS — I509 Heart failure, unspecified: Secondary | ICD-10-CM | POA: Diagnosis not present

## 2015-08-30 DIAGNOSIS — I110 Hypertensive heart disease with heart failure: Secondary | ICD-10-CM | POA: Diagnosis not present

## 2015-08-30 LAB — CBC
HCT: 32.8 % — ABNORMAL LOW (ref 39.0–52.0)
Hemoglobin: 10.6 g/dL — ABNORMAL LOW (ref 13.0–17.0)
MCH: 30.3 pg (ref 26.0–34.0)
MCHC: 32.3 g/dL (ref 30.0–36.0)
MCV: 93.7 fL (ref 78.0–100.0)
PLATELETS: 112 10*3/uL — AB (ref 150–400)
RBC: 3.5 MIL/uL — AB (ref 4.22–5.81)
RDW: 13.6 % (ref 11.5–15.5)
WBC: 6.6 10*3/uL (ref 4.0–10.5)

## 2015-08-30 LAB — BASIC METABOLIC PANEL
ANION GAP: 4 — AB (ref 5–15)
BUN: 37 mg/dL — ABNORMAL HIGH (ref 6–20)
CALCIUM: 9.3 mg/dL (ref 8.9–10.3)
CO2: 26 mmol/L (ref 22–32)
Chloride: 108 mmol/L (ref 101–111)
Creatinine, Ser: 2.02 mg/dL — ABNORMAL HIGH (ref 0.61–1.24)
GFR, EST AFRICAN AMERICAN: 37 mL/min — AB (ref >60)
GFR, EST NON AFRICAN AMERICAN: 32 mL/min — AB (ref >60)
GLUCOSE: 85 mg/dL (ref 65–99)
Potassium: 5.1 mmol/L (ref 3.5–5.1)
SODIUM: 138 mmol/L (ref 135–145)

## 2015-08-30 LAB — GLUCOSE, CAPILLARY: GLUCOSE-CAPILLARY: 108 mg/dL — AB (ref 65–99)

## 2015-08-30 MED ORDER — SULFAMETHOXAZOLE-TRIMETHOPRIM 400-80 MG PO TABS: 1.0000 | ORAL_TABLET | Freq: Two times a day (BID) | ORAL | Status: DC

## 2015-08-30 MED ORDER — DOCUSATE SODIUM 100 MG PO CAPS: 100.0000 mg | ORAL_CAPSULE | Freq: Two times a day (BID) | ORAL | Status: DC

## 2015-08-30 MED ORDER — OXYCODONE-ACETAMINOPHEN 5-325 MG PO TABS: 1.0000 | ORAL_TABLET | ORAL | Status: DC | PRN

## 2015-08-30 NOTE — Progress Notes (Signed)
1 Day Post-Op Subjective: The patient is doing well.  No nausea or vomiting. Pain is adequately controlled. AF, VSS. No acute events overnight. Foley out but has not voided.  Objective: Vital signs in last 24 hours: Temp:  [97.4 F (36.3 C)-98.2 F (36.8 C)] 98 F (36.7 C) (06/20 0553) Pulse Rate:  [61-78] 61 (06/20 0553) Resp:  [14-21] 18 (06/20 0553) BP: (121-167)/(58-89) 125/61 mmHg (06/20 0553) SpO2:  [98 %-100 %] 100 % (06/20 0553) Weight:  [85.73 kg (189 lb)-86.682 kg (191 lb 1.6 oz)] 86.682 kg (191 lb 1.6 oz) (06/19 1940)  Intake/Output from previous day: 06/19 0701 - 06/20 0700 In: 1145 [I.V.:1145] Out: [id number] [Urine:1440; Blood:50] Intake/Output this shift:    Physical Exam:  General: Alert and oriented. CV: RRR Lungs: Clear bilaterally. GI: Soft, Nondistended. Incisions: Scrotal and glans incisions are clean, dry, and intact with dermabond. Partially erect circumcised male phallus s/p IPP. Pump is in dependent portion of scrotum. IPP deflated at bedside on rounds. Urine: Foley out Extremities: Nontender, no erythema, no edema.  Lab Results:  Recent Labs  08/30/15 0447  HGB 10.6*  HCT 32.8*      Assessment/Plan: POD# 1 s/p IPP placement.  1) SL IVF 2) Ambulate, Incentive spirometry 3) Transition to oral pain medication 4) IPP deflated at bedside 5) D/C foley catheter and perform TOV 6) Plan for likely discharge later today     Acie Fredrickson 08/30/2015, 7:01 AM

## 2015-08-30 NOTE — Discharge Summary (Signed)
Physician Discharge Summary  Patient ID: [name] MRN: [id number] DOB/AGE: 08-24-1945 87 y.o.  Admit date: 08/29/2015 Discharge date: 08/30/2015  Admission Diagnoses: Erectile dysfunction  Discharge Diagnoses:  Active Problems:   Erectile dysfunction   Discharged Condition: good  Hospital Course:  70 yo male who is s/p IPP placement. He did well post-operatively. His diet was slowly advanced and at the time of discharge he was tolerating a regular diet, ambulating at his baseline, was voiding spontaneously after foley catheter removal, penile prosthesis had been deflated the morning of POD#1 and pain was well controlled with oral narcotics. He was discharged to home on POD#1.  Consults: None  Significant Diagnostic Studies: none  Treatments: surgery: placement of inflatable penile prosthesis  Discharge Exam: Blood pressure 125/61, pulse 61, temperature 98 F (36.7 C), temperature source Oral, resp. rate 18, height 5\' 8"  (1.727 m), weight 86.682 kg (191 lb 1.6 oz), SpO2 100 %. General: Alert and oriented. CV: RRR Lungs: Clear bilaterally. GI: Soft, Nondistended. Incisions: Scrotal and glans incisions are clean, dry, and intact with dermabond. Deflated circumcised male phallus s/p IPP. Pump is in dependent portion of scrotum.  Urine: Foley out Extremities: Nontender, no erythema, no edema.  Disposition: 01-Home or Self Care      Discharge Instructions    Call MD for:  difficulty breathing, headache or visual disturbances    Complete by:  As directed      Call MD for:  extreme fatigue    Complete by:  As directed      Call MD for:  hives    Complete by:  As directed      Call MD for:  persistant dizziness or light-headedness    Complete by:  As directed      Call MD for:  persistant nausea and vomiting    Complete by:  As directed      Call MD for:  redness, tenderness, or signs of infection (pain, swelling, redness, odor or green/yellow discharge around incision  site)    Complete by:  As directed      Call MD for:  severe uncontrolled pain    Complete by:  As directed      Call MD for:  temperature >100.4    Complete by:  As directed      Increase activity slowly    Complete by:  As directed             Medication List    STOP taking these medications        clopidogrel 75 MG tablet  Commonly known as:  PLAVIX     oxyCODONE-acetaminophen 10-325 MG tablet  Commonly known as:  PERCOCET  Replaced by:  oxyCODONE-acetaminophen 5-325 MG tablet      TAKE these medications        allopurinol 100 MG tablet  Commonly known as:  ZYLOPRIM  Take 100 mg by mouth daily as needed (For gout.).     amLODipine 5 MG tablet  Commonly known as:  NORVASC  Take 1 tablet (5 mg total) by mouth every morning.     aspirin EC 81 MG tablet  Take 81 mg by mouth daily.     atorvastatin 40 MG tablet  Commonly known as:  LIPITOR  Take 40 mg by mouth every morning.     carvedilol 12.5 MG tablet  Commonly known as:  COREG  TAKE ONE TABLET BY MOUTH TWICE DAILY     colchicine 0.6 MG tablet  Take 0.6  mg by mouth daily as needed (For gout flare-ups.).     docusate sodium 100 MG capsule  Commonly known as:  COLACE  Take 1 capsule (100 mg total) by mouth 2 (two) times daily. Hold for diarrhea.     glimepiride 1 MG tablet  Commonly known as:  AMARYL  Take 1 tablet by mouth daily. Reported on 03/10/2015     hydrALAZINE 50 MG tablet  Commonly known as:  APRESOLINE  Take 1.5 tablets (75 mg total) by mouth 3 (three) times daily.     isosorbide mononitrate 60 MG 24 hr tablet  Commonly known as:  IMDUR  Take 1.5 tablets (90 mg total) by mouth daily.     oxyCODONE-acetaminophen 5-325 MG tablet  Commonly known as:  ROXICET  Take 1-2 tablets by mouth every 4 (four) hours as needed for severe pain.     potassium chloride SA 20 MEQ tablet  Commonly known as:  K-DUR,KLOR-CON  Take 1 tablet (20 mEq total) by mouth daily.     sulfamethoxazole-trimethoprim  400-80 MG tablet  Commonly known as:  BACTRIM  Take 1 tablet by mouth 2 (two) times daily.     torsemide 20 MG tablet  Commonly known as:  DEMADEX  Take 4 tabs every other day ALTERNATING with 3 tabs every other day     traZODone 150 MG tablet  Commonly known as:  DESYREL  Take 150 mg by mouth at bedtime as needed for sleep.       Follow-up Information    Call Nicolette Bang, MD.   Specialty:  Urology   Why:  For wound re-check as previously scheduled   Contact information:   [place] [id number] [phone]       Signed: Acie Fredrickson 08/30/2015, 10:50 AM

## 2015-08-30 NOTE — Discharge Instructions (Signed)
Inflatable Penile Prosthesis-Home Care Instructions  The following instructions have been prepared to help you care for yourself upon your return home today.   Wound Care & Hygiene:   You may apply ice to the penis.  This may help to decrease swelling.  Remove the dressing tomorrow.  If the dressing falls off before then, leave it off.  You may shower or bathe in 48 hours  Gently wash the penis with soap and water.  The stitches do not need to be removed.  Activity:  Do not drive or operate any equipment today.  The effects of anesthesia are still present, drowsiness may result.  Rest today, not necessarily flat bed rest, just take it easy.  You may resume your normal activity in one to two days or as indicated by your physician.  DO NOT ACTIVATE OR ATTEMPT TO USE YOUR PROSTHESIS DEVICE UNTIL ADVISED TO BY DR. Alyson Ingles (6 weeks following surgery)  Finish 10 day course of antibiotics as prescribed (Bactrim).  At 10 days following surgery you may soak in a bathtub for 20 minutes per day and massage pump down into scrotum  You may resume your Plavix 5 days following surgery (Saturday 09/03/15)  Sexual Activity:  Erection and sexual relations should be avoided until you are instructed otherwise by Dr. Alyson Ingles  Return to Work:  One week. DO NOT LIFT ANY OBJECTS HEAVIER THAN A GALLON OF MILK (10 Lbs) or RIDE ON HEAVY MACHINERY UNTIL INSTRUCTED OTHERWISE  Diet:  Drink liquids or eat a very light diet this evening.  You may resume a regular diet tomorrow.  General Expectations of your surgery:   You may have a small amount of bleeding  The penis will be swollen and bruised for approximately one week  You may wake during the night with an erection, usually this is caused by having a full bladder so you should try to urinate (pass your water) to relieve the erection or apply ice to the penis  Unexpected Observations - Call your doctor if these occur!  Persistent or heavy  bleeding  Temperature of 101 degrees or more  Severe pain not relieved by medication

## 2015-09-14 ENCOUNTER — Other Ambulatory Visit (HOSPITAL_COMMUNITY): Payer: Self-pay | Admitting: Cardiology

## 2015-09-14 ENCOUNTER — Other Ambulatory Visit (HOSPITAL_COMMUNITY): Payer: Self-pay | Admitting: Adult Health

## 2015-09-14 DIAGNOSIS — M25561 Pain in right knee: Secondary | ICD-10-CM | POA: Diagnosis not present

## 2015-09-14 DIAGNOSIS — M25562 Pain in left knee: Secondary | ICD-10-CM | POA: Diagnosis not present

## 2015-09-28 ENCOUNTER — Ambulatory Visit (INDEPENDENT_AMBULATORY_CARE_PROVIDER_SITE_OTHER): Payer: Medicare Other | Admitting: *Deleted

## 2015-09-28 DIAGNOSIS — I255 Ischemic cardiomyopathy: Secondary | ICD-10-CM | POA: Diagnosis not present

## 2015-09-28 NOTE — Progress Notes (Signed)
Remote ICD transmission.   

## 2015-09-29 ENCOUNTER — Encounter: Payer: Self-pay | Admitting: Cardiology

## 2015-10-03 LAB — CUP PACEART REMOTE DEVICE CHECK
Battery Remaining Longevity: 106 mo
Battery Voltage: 3.02 V
Brady Statistic AS VP Percent: 87.62 %
Brady Statistic AS VS Percent: 1.6 %
Date Time Interrogation Session: 20170719073323
HIGH POWER IMPEDANCE MEASURED VALUE: 51 Ohm
Implantable Lead Implant Date: 20140207
Implantable Lead Implant Date: 20170105
Implantable Lead Location: 753858
Implantable Lead Location: 753859
Implantable Lead Location: 753860
Implantable Lead Model: 4298
Implantable Lead Model: 5076
Lead Channel Impedance Value: 247 Ohm
Lead Channel Impedance Value: 304 Ohm
Lead Channel Impedance Value: 361 Ohm
Lead Channel Impedance Value: 399 Ohm
Lead Channel Impedance Value: 475 Ohm
Lead Channel Impedance Value: 551 Ohm
Lead Channel Impedance Value: 646 Ohm
Lead Channel Pacing Threshold Amplitude: 0.625 V
Lead Channel Pacing Threshold Amplitude: 1.125 V
Lead Channel Pacing Threshold Pulse Width: 0.4 ms
Lead Channel Pacing Threshold Pulse Width: 0.4 ms
Lead Channel Setting Pacing Amplitude: 1.5 V
Lead Channel Setting Sensing Sensitivity: 0.3 mV
MDC IDC LEAD IMPLANT DT: 20140207
MDC IDC LEAD MODEL: 6935
MDC IDC MSMT LEADCHNL LV IMPEDANCE VALUE: 342 Ohm
MDC IDC MSMT LEADCHNL LV IMPEDANCE VALUE: 342 Ohm
MDC IDC MSMT LEADCHNL LV IMPEDANCE VALUE: 361 Ohm
MDC IDC MSMT LEADCHNL LV IMPEDANCE VALUE: 513 Ohm
MDC IDC MSMT LEADCHNL LV IMPEDANCE VALUE: 532 Ohm
MDC IDC MSMT LEADCHNL RA IMPEDANCE VALUE: 361 Ohm
MDC IDC MSMT LEADCHNL RA SENSING INTR AMPL: 1.625 mV
MDC IDC MSMT LEADCHNL RA SENSING INTR AMPL: 1.625 mV
MDC IDC MSMT LEADCHNL RV PACING THRESHOLD AMPLITUDE: 0.625 V
MDC IDC MSMT LEADCHNL RV PACING THRESHOLD PULSEWIDTH: 0.4 ms
MDC IDC MSMT LEADCHNL RV SENSING INTR AMPL: 15.375 mV
MDC IDC MSMT LEADCHNL RV SENSING INTR AMPL: 15.375 mV
MDC IDC SET LEADCHNL LV PACING AMPLITUDE: 2.25 V
MDC IDC SET LEADCHNL LV PACING PULSEWIDTH: 0.4 ms
MDC IDC STAT BRADY AP VP PERCENT: 10.6 %
MDC IDC STAT BRADY AP VS PERCENT: 0.18 %
MDC IDC STAT BRADY RA PERCENT PACED: 10.77 %
MDC IDC STAT BRADY RV PERCENT PACED: 12.13 %

## 2015-10-04 ENCOUNTER — Emergency Department (HOSPITAL_COMMUNITY)
Admission: EM | Admit: 2015-10-04 | Discharge: 2015-10-04 | Disposition: A | Discharge status: Home / Self Care | Payer: Medicare Other | Attending: Emergency Medicine | Admitting: Emergency Medicine

## 2015-10-04 ENCOUNTER — Encounter (HOSPITAL_COMMUNITY): Payer: Self-pay | Admitting: Vascular Surgery

## 2015-10-04 ENCOUNTER — Emergency Department (HOSPITAL_COMMUNITY): Payer: Medicare Other

## 2015-10-04 DIAGNOSIS — J449 Chronic obstructive pulmonary disease, unspecified: Secondary | ICD-10-CM | POA: Insufficient documentation

## 2015-10-04 DIAGNOSIS — I2510 Atherosclerotic heart disease of native coronary artery without angina pectoris: Secondary | ICD-10-CM | POA: Insufficient documentation

## 2015-10-04 DIAGNOSIS — E1122 Type 2 diabetes mellitus with diabetic chronic kidney disease: Secondary | ICD-10-CM | POA: Insufficient documentation

## 2015-10-04 DIAGNOSIS — I130 Hypertensive heart and chronic kidney disease with heart failure and stage 1 through stage 4 chronic kidney disease, or unspecified chronic kidney disease: Secondary | ICD-10-CM | POA: Diagnosis not present

## 2015-10-04 DIAGNOSIS — Z951 Presence of aortocoronary bypass graft: Secondary | ICD-10-CM | POA: Diagnosis not present

## 2015-10-04 DIAGNOSIS — Z7982 Long term (current) use of aspirin: Secondary | ICD-10-CM | POA: Diagnosis not present

## 2015-10-04 DIAGNOSIS — I5043 Acute on chronic combined systolic (congestive) and diastolic (congestive) heart failure: Secondary | ICD-10-CM | POA: Insufficient documentation

## 2015-10-04 DIAGNOSIS — N184 Chronic kidney disease, stage 4 (severe): Secondary | ICD-10-CM | POA: Insufficient documentation

## 2015-10-04 DIAGNOSIS — M7989 Other specified soft tissue disorders: Secondary | ICD-10-CM | POA: Diagnosis present

## 2015-10-04 DIAGNOSIS — M19071 Primary osteoarthritis, right ankle and foot: Secondary | ICD-10-CM

## 2015-10-04 DIAGNOSIS — Z79899 Other long term (current) drug therapy: Secondary | ICD-10-CM | POA: Insufficient documentation

## 2015-10-04 DIAGNOSIS — Z95810 Presence of automatic (implantable) cardiac defibrillator: Secondary | ICD-10-CM | POA: Diagnosis not present

## 2015-10-04 DIAGNOSIS — Z9660 Presence of unspecified orthopedic joint implant: Secondary | ICD-10-CM | POA: Insufficient documentation

## 2015-10-04 DIAGNOSIS — I252 Old myocardial infarction: Secondary | ICD-10-CM | POA: Insufficient documentation

## 2015-10-04 DIAGNOSIS — Z7984 Long term (current) use of oral hypoglycemic drugs: Secondary | ICD-10-CM | POA: Diagnosis not present

## 2015-10-04 MED ORDER — HYDROCODONE-ACETAMINOPHEN 5-325 MG PO TABS
1.0000 | ORAL_TABLET | Freq: Once | ORAL | Status: AC
  Administered 2015-10-04: 1 via ORAL
  Filled 2015-10-04: qty 1

## 2015-10-04 MED ORDER — HYDROCODONE-ACETAMINOPHEN 5-325 MG PO TABS: 1.0000 | ORAL_TABLET | ORAL | 0 refills | Status: DC | PRN

## 2015-10-04 NOTE — ED Triage Notes (Signed)
Pt reports to the ED for eval of right foot pain and swelling that started yesterday. Denies any known injury. Pt has hx of DM. Denies any sores to feet. Pt has hx of gout but states this feels different. Pt A&Ox4, resp e/u, and skin warm and dry.
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
## 2015-10-11 ENCOUNTER — Other Ambulatory Visit (HOSPITAL_COMMUNITY): Payer: Self-pay | Admitting: Cardiology

## 2015-10-11 ENCOUNTER — Other Ambulatory Visit (HOSPITAL_COMMUNITY): Payer: Self-pay | Admitting: Adult Health

## 2015-10-11 IMAGING — CT CT NECK W/ CM
2 of 4 series · 7 of 33 positions shown, 8 images · IV contrast (Iodine)
Comparison: None.

CLINICAL DATA: Sore throat.

EXAM:
CT NECK WITH CONTRAST
TECHNIQUE: Multidetector CT imaging of the neck was performed using the
standard protocol following the bolus administration of intravenous
contrast.
CONTRAST:  75mL OMNIPAQUE IOHEXOL 300 MG/ML  SOLN

[Series 204: coronal · coronal · 0.56mm/px · 2 of 61 slices shown]
[im 20/61  bone]
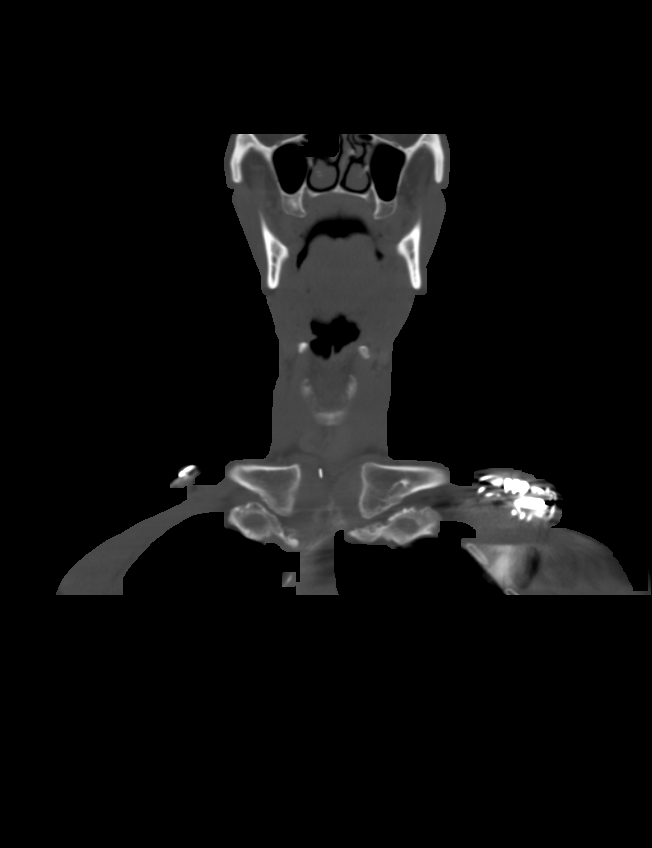
[im 38/61  bone]
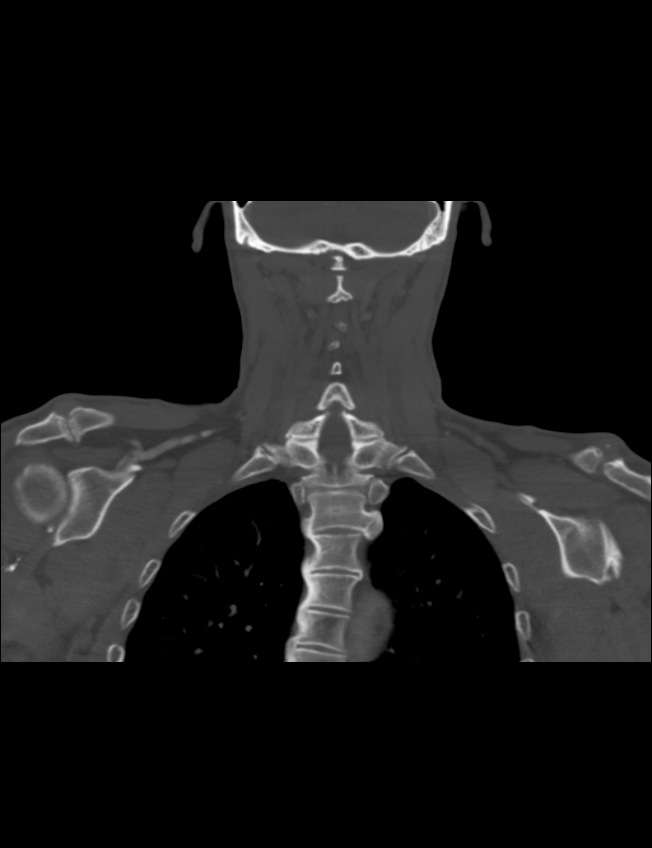

[Series 205: sagittal · sagittal · 0.50mm/px · 5 of 67 slices shown, 6 images]
[im 23/67  bone]
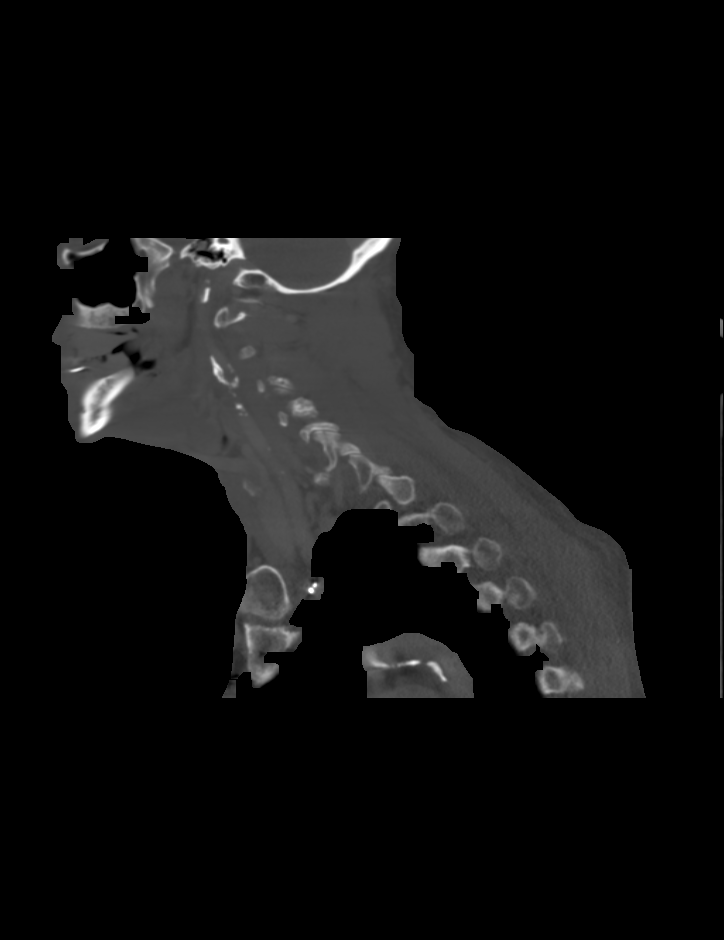
[im 28/67  bone]
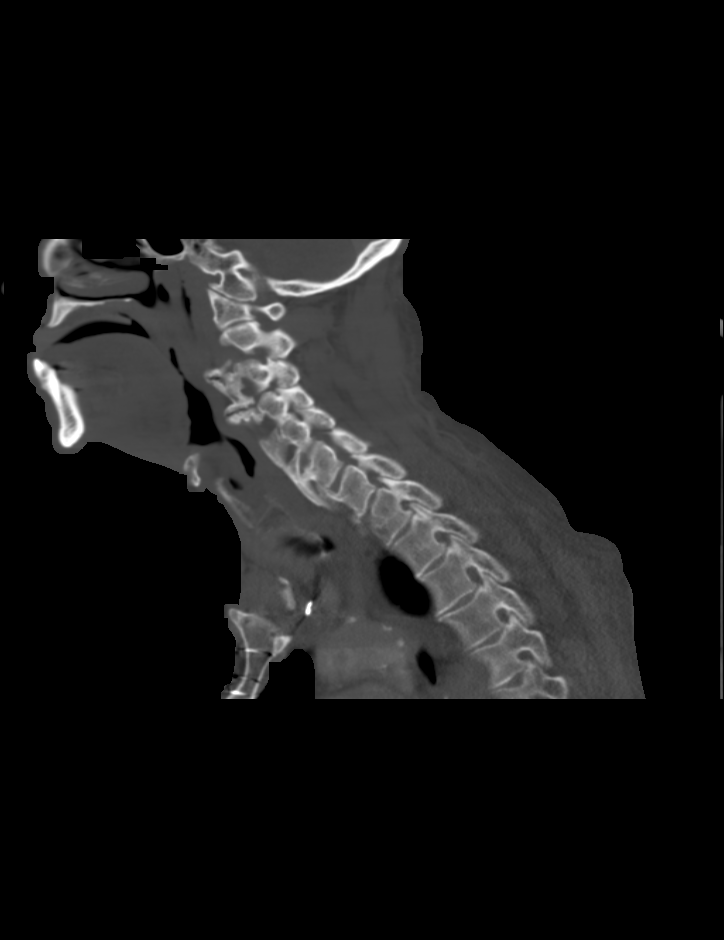
[im 34/67  soft-tissue]
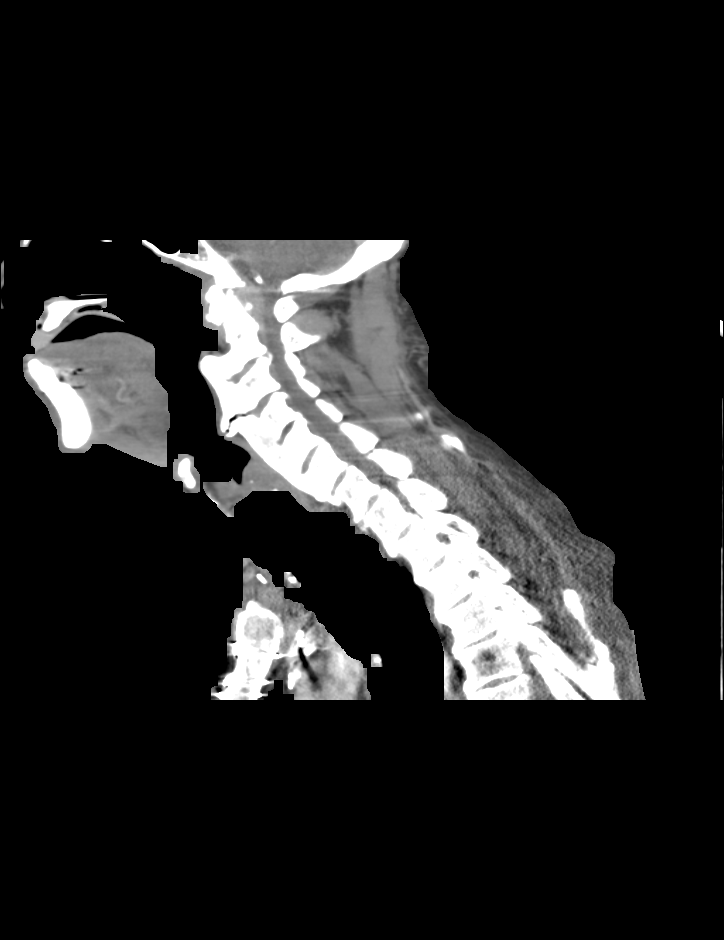
[im 34/67  bone]
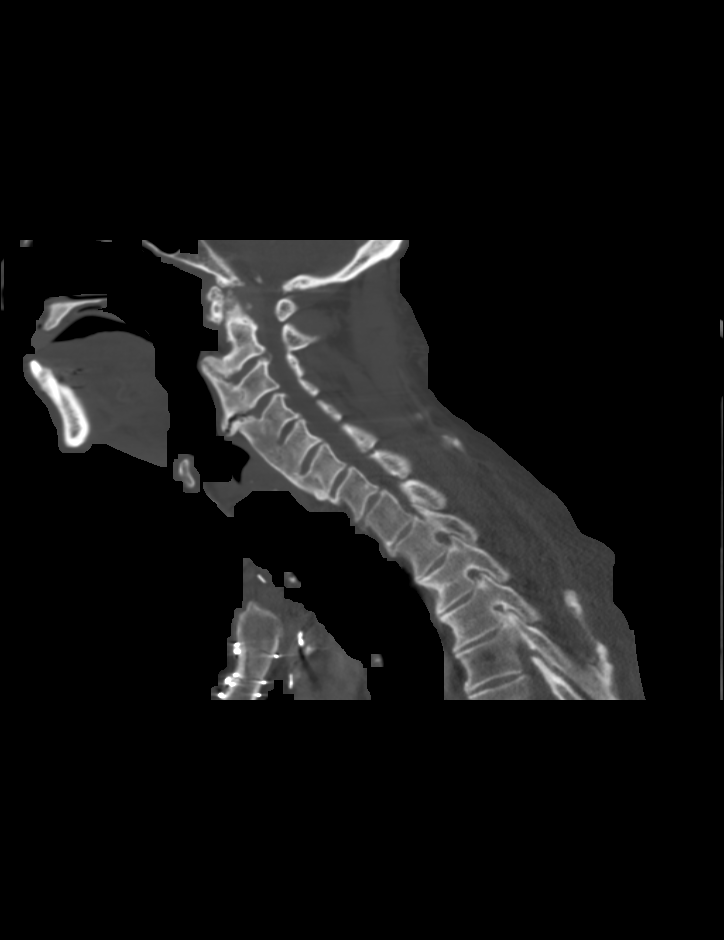
[im 39/67  bone]
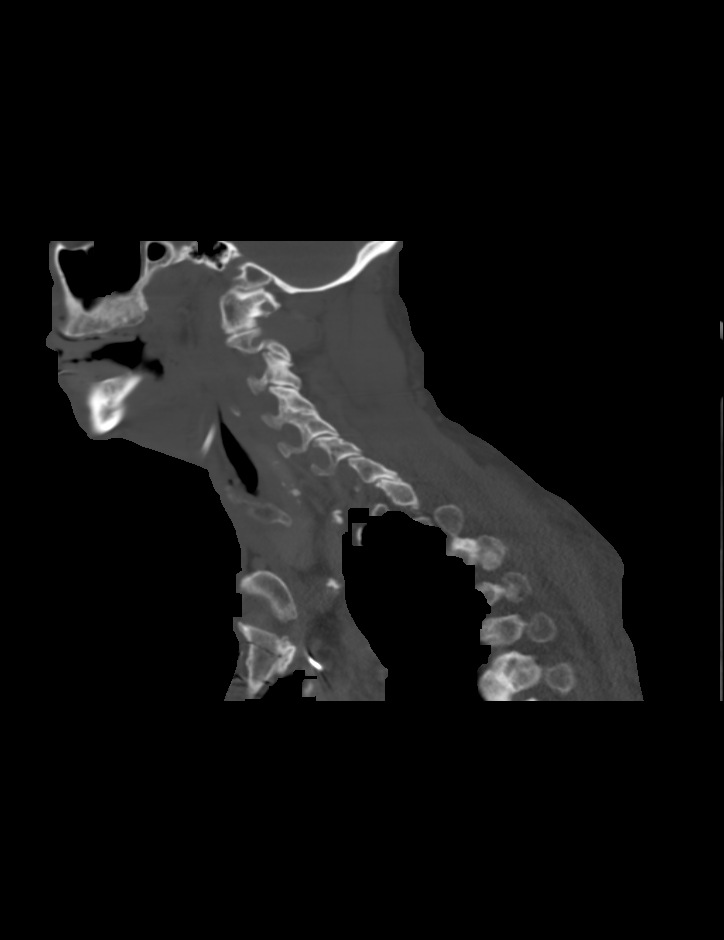
[im 45/67  bone]
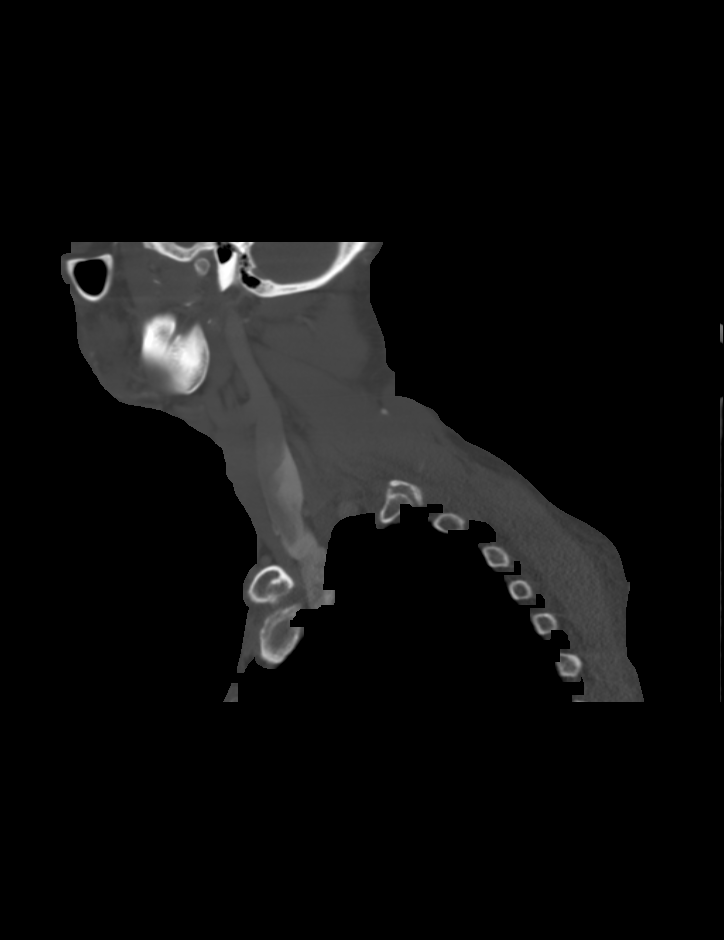

[7 of 33 positions shown; findings below may reference images not displayed]

FINDINGS: Asymmetric fullness of the left palatine tonsil measuring 16 mm.
Possible tumor. Direct visualization and possible biopsy suggested.

The tongue base is normal. The epiglottis and larynx are normal.
Parotid and submandibular glands are normal. Thyroid is normal. Lung
apices are clear.

Negative for cervical adenopathy.

Extensive ossification of the anterior longitudinal ligament. Large
anterior osteophytes are present indenting the posterior pharyngeal
wall at C2-3 and C3-4. This could be a cause of dysphagia. No acute
bony abnormality.

Carotid atherosclerotic disease is present bilaterally. There
appears to be severe stenosis of the internal carotid artery
bilaterally. Recommend follow-up carotid Doppler for further
evaluation.
IMPRESSION: Asymmetry of the left palatine tonsil. This may represent a
carcinoma of the tonsil and direct visualization and possible biopsy
is suggested. No adenopathy.

Severe carotid stenosis bilaterally due to atherosclerotic calcified
plaque. Recommend carotid Doppler evaluation.

Advanced ossification of the anterior longitudinal ligament C2
through C7. Large anterior osteophytes at C2-3 and C3-4 indent the
posterior pharynx and could be a cause of dysphasia.

## 2015-10-14 DIAGNOSIS — N5201 Erectile dysfunction due to arterial insufficiency: Secondary | ICD-10-CM | POA: Diagnosis not present

## 2015-10-16 ENCOUNTER — Encounter (HOSPITAL_COMMUNITY): Payer: Self-pay | Admitting: *Deleted

## 2015-10-16 ENCOUNTER — Emergency Department (HOSPITAL_COMMUNITY)
Admission: EM | Admit: 2015-10-16 | Discharge: 2015-10-16 | Disposition: A | Discharge status: Home / Self Care | Payer: Medicare Other | Attending: Emergency Medicine | Admitting: Emergency Medicine

## 2015-10-16 DIAGNOSIS — Z95810 Presence of automatic (implantable) cardiac defibrillator: Secondary | ICD-10-CM | POA: Insufficient documentation

## 2015-10-16 DIAGNOSIS — Z7984 Long term (current) use of oral hypoglycemic drugs: Secondary | ICD-10-CM | POA: Diagnosis not present

## 2015-10-16 DIAGNOSIS — M10072 Idiopathic gout, left ankle and foot: Secondary | ICD-10-CM | POA: Insufficient documentation

## 2015-10-16 DIAGNOSIS — I5042 Chronic combined systolic (congestive) and diastolic (congestive) heart failure: Secondary | ICD-10-CM | POA: Diagnosis not present

## 2015-10-16 DIAGNOSIS — Z951 Presence of aortocoronary bypass graft: Secondary | ICD-10-CM | POA: Insufficient documentation

## 2015-10-16 DIAGNOSIS — I130 Hypertensive heart and chronic kidney disease with heart failure and stage 1 through stage 4 chronic kidney disease, or unspecified chronic kidney disease: Secondary | ICD-10-CM | POA: Diagnosis not present

## 2015-10-16 DIAGNOSIS — E1122 Type 2 diabetes mellitus with diabetic chronic kidney disease: Secondary | ICD-10-CM | POA: Insufficient documentation

## 2015-10-16 DIAGNOSIS — Z7982 Long term (current) use of aspirin: Secondary | ICD-10-CM | POA: Insufficient documentation

## 2015-10-16 DIAGNOSIS — I2510 Atherosclerotic heart disease of native coronary artery without angina pectoris: Secondary | ICD-10-CM | POA: Insufficient documentation

## 2015-10-16 DIAGNOSIS — N184 Chronic kidney disease, stage 4 (severe): Secondary | ICD-10-CM | POA: Insufficient documentation

## 2015-10-16 DIAGNOSIS — I252 Old myocardial infarction: Secondary | ICD-10-CM | POA: Insufficient documentation

## 2015-10-16 DIAGNOSIS — M109 Gout, unspecified: Secondary | ICD-10-CM

## 2015-10-16 DIAGNOSIS — Z87891 Personal history of nicotine dependence: Secondary | ICD-10-CM | POA: Diagnosis not present

## 2015-10-16 DIAGNOSIS — M79672 Pain in left foot: Secondary | ICD-10-CM | POA: Diagnosis present

## 2015-10-16 DIAGNOSIS — Z79899 Other long term (current) drug therapy: Secondary | ICD-10-CM | POA: Diagnosis not present

## 2015-10-16 MED ORDER — COLCHICINE 0.6 MG PO TABS
1.2000 mg | ORAL_TABLET | Freq: Once | ORAL | Status: AC
  Administered 2015-10-16: 1.2 mg via ORAL
  Filled 2015-10-16: qty 2

## 2015-10-16 MED ORDER — COLCHICINE 0.6 MG PO TABS: 0.6000 mg | ORAL_TABLET | Freq: Two times a day (BID) | ORAL | 0 refills | Status: DC

## 2015-10-16 NOTE — ED Triage Notes (Signed)
Pt reports pain and swelling to left foot since yesterday. Was here on 7/25 for same on right foot. Hx of gout but states this pain is different.

## 2015-10-16 NOTE — Discharge Instructions (Signed)
You may take Colchicine twice daily until your pain resolves.  Please follow up with your primary care physician for possible gout maintenance medications to prevent further attacks.  Return sooner if you experience worsening pain, muscle aches, or have increased difficulty walking.
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
## 2015-10-19 ENCOUNTER — Other Ambulatory Visit: Payer: Self-pay | Admitting: Cardiovascular Disease

## 2015-10-19 DIAGNOSIS — I6523 Occlusion and stenosis of bilateral carotid arteries: Secondary | ICD-10-CM

## 2015-10-24 ENCOUNTER — Ambulatory Visit (HOSPITAL_COMMUNITY)
Admission: RE | Admit: 2015-10-24 | Discharge: 2015-10-24 | Disposition: A | Discharge status: Home / Self Care | Payer: Medicare Other | Source: Ambulatory Visit | Attending: Cardiovascular Disease | Admitting: Cardiovascular Disease

## 2015-10-24 DIAGNOSIS — E1122 Type 2 diabetes mellitus with diabetic chronic kidney disease: Secondary | ICD-10-CM | POA: Diagnosis not present

## 2015-10-24 DIAGNOSIS — I6523 Occlusion and stenosis of bilateral carotid arteries: Secondary | ICD-10-CM | POA: Diagnosis not present

## 2015-10-24 DIAGNOSIS — I130 Hypertensive heart and chronic kidney disease with heart failure and stage 1 through stage 4 chronic kidney disease, or unspecified chronic kidney disease: Secondary | ICD-10-CM | POA: Insufficient documentation

## 2015-10-24 DIAGNOSIS — E785 Hyperlipidemia, unspecified: Secondary | ICD-10-CM | POA: Insufficient documentation

## 2015-10-24 DIAGNOSIS — N183 Chronic kidney disease, stage 3 (moderate): Secondary | ICD-10-CM | POA: Insufficient documentation

## 2015-10-24 DIAGNOSIS — I2510 Atherosclerotic heart disease of native coronary artery without angina pectoris: Secondary | ICD-10-CM | POA: Diagnosis not present

## 2015-10-24 DIAGNOSIS — I5042 Chronic combined systolic (congestive) and diastolic (congestive) heart failure: Secondary | ICD-10-CM | POA: Diagnosis not present

## 2015-10-27 ENCOUNTER — Other Ambulatory Visit: Payer: Self-pay | Admitting: *Deleted

## 2015-10-27 DIAGNOSIS — I739 Peripheral vascular disease, unspecified: Principal | ICD-10-CM

## 2015-10-27 DIAGNOSIS — I779 Disorder of arteries and arterioles, unspecified: Secondary | ICD-10-CM

## 2015-11-16 ENCOUNTER — Telehealth: Payer: Self-pay | Admitting: Cardiovascular Disease

## 2015-11-16 DIAGNOSIS — M216X2 Other acquired deformities of left foot: Secondary | ICD-10-CM | POA: Diagnosis not present

## 2015-11-16 DIAGNOSIS — M2012 Hallux valgus (acquired), left foot: Secondary | ICD-10-CM | POA: Diagnosis not present

## 2015-11-16 DIAGNOSIS — I7389 Other specified peripheral vascular diseases: Secondary | ICD-10-CM | POA: Diagnosis not present

## 2015-11-16 DIAGNOSIS — M216X1 Other acquired deformities of right foot: Secondary | ICD-10-CM | POA: Diagnosis not present

## 2015-11-16 DIAGNOSIS — E1151 Type 2 diabetes mellitus with diabetic peripheral angiopathy without gangrene: Secondary | ICD-10-CM | POA: Diagnosis not present

## 2015-11-16 NOTE — Telephone Encounter (Signed)
Received records from [facility] for appointment with Dr Gwenlyn Found on 11/23/15.  Records given to [facility] (medical records) for Dr Kennon Holter schedule on 11/23/15. lp

## 2015-11-17 ENCOUNTER — Telehealth: Payer: Self-pay | Admitting: Cardiovascular Disease

## 2015-11-17 NOTE — Telephone Encounter (Signed)
New message  Pt call requesting to speak with RN. Pt states he is having issues with is left leg. Pt states he feels like he has a blockage in his leg. Pt states he went to the foot doctor and was instructed to get an appt with Card. doctor. Pt states he is in really bad pain and would like to be seen soon than appt on 9/13. Please call back to discuss

## 2015-11-17 NOTE — Telephone Encounter (Signed)
Returned call. Patient informs me he is OK to wait until 9/13 to be seen. Reaffirmed appt information, pt voiced thanks and understanding.

## 2015-11-19 DIAGNOSIS — M79605 Pain in left leg: Secondary | ICD-10-CM | POA: Diagnosis not present

## 2015-11-19 DIAGNOSIS — I10 Essential (primary) hypertension: Secondary | ICD-10-CM | POA: Diagnosis not present

## 2015-11-19 DIAGNOSIS — M25562 Pain in left knee: Secondary | ICD-10-CM | POA: Diagnosis not present

## 2015-11-19 DIAGNOSIS — E119 Type 2 diabetes mellitus without complications: Secondary | ICD-10-CM | POA: Diagnosis not present

## 2015-11-19 DIAGNOSIS — M25462 Effusion, left knee: Secondary | ICD-10-CM | POA: Diagnosis not present

## 2015-11-19 DIAGNOSIS — M19072 Primary osteoarthritis, left ankle and foot: Secondary | ICD-10-CM | POA: Diagnosis not present

## 2015-11-19 DIAGNOSIS — R0989 Other specified symptoms and signs involving the circulatory and respiratory systems: Secondary | ICD-10-CM | POA: Diagnosis not present

## 2015-11-19 DIAGNOSIS — M1712 Unilateral primary osteoarthritis, left knee: Secondary | ICD-10-CM | POA: Diagnosis not present

## 2015-11-23 ENCOUNTER — Encounter: Payer: Self-pay | Admitting: Cardiovascular Disease

## 2015-11-23 ENCOUNTER — Ambulatory Visit (INDEPENDENT_AMBULATORY_CARE_PROVIDER_SITE_OTHER): Payer: Medicare Other | Admitting: Cardiovascular Disease

## 2015-11-23 VITALS — BP 142/60 | HR 64 | Ht 68.0 in | Wt 180.8 lb

## 2015-11-23 DIAGNOSIS — I2510 Atherosclerotic heart disease of native coronary artery without angina pectoris: Secondary | ICD-10-CM

## 2015-11-23 DIAGNOSIS — I255 Ischemic cardiomyopathy: Secondary | ICD-10-CM

## 2015-11-23 DIAGNOSIS — M79606 Pain in leg, unspecified: Secondary | ICD-10-CM

## 2015-11-23 DIAGNOSIS — I739 Peripheral vascular disease, unspecified: Secondary | ICD-10-CM

## 2015-11-23 DIAGNOSIS — I2583 Coronary atherosclerosis due to lipid rich plaque: Secondary | ICD-10-CM

## 2015-11-23 DIAGNOSIS — Z23 Encounter for immunization: Secondary | ICD-10-CM | POA: Diagnosis not present

## 2015-11-23 DIAGNOSIS — M109 Gout, unspecified: Secondary | ICD-10-CM | POA: Diagnosis not present

## 2015-11-23 DIAGNOSIS — I779 Disorder of arteries and arterioles, unspecified: Secondary | ICD-10-CM

## 2015-11-23 DIAGNOSIS — E785 Hyperlipidemia, unspecified: Secondary | ICD-10-CM

## 2015-11-23 NOTE — Assessment & Plan Note (Signed)
History of coronary artery disease status post coronary artery bypass grafting X 3 in 2006 with a LIMA to his LAD, vein to the circumflex and PDA. A Myoview performed 09/02/13 showed no ischemia. He denies chest pain or shortness of breath.

## 2015-11-23 NOTE — Patient Instructions (Addendum)
Medication Instructions:  Your physician recommends that you continue on your current medications as directed. Please refer to the Current Medication list given to you today.  Labwork: none  Testing/Procedures: Your physician has requested that you have a lower or upper extremity arterial duplex. This test is an ultrasound of the arteries in the legs or arms. It looks at arterial blood flow in the legs and arms. Allow one hour for Lower and Upper Arterial scans. There are no restrictions or special instructions Lower extremity  Follow-Up: Your physician wants you to follow-up in: 1 year ov You will receive a reminder letter in the mail two months in advance. If you don't receive a letter, please call our office to schedule the follow-up appointment.  If you need a refill on your cardiac medications before your next appointment, please call your pharmacy.

## 2015-11-23 NOTE — Assessment & Plan Note (Signed)
History of hypertension with blood pressures measured at 142/60. He is on amlodipine, carvedilol, hydralazine. Continue current meds current dosing. He is not on an ACE inhibitor or ARB because of chronic renal insufficiency.

## 2015-11-23 NOTE — Assessment & Plan Note (Signed)
History of carotid artery disease status post right internal carotid artery stenting by myself 10/20/13. His most recent Dopplers performed 10/24/15 revealed a widely patent stent with mild to moderate left ICA stenosis.

## 2015-11-23 NOTE — Assessment & Plan Note (Signed)
History of ischemic cardiomyopathy with his last echo performed 11/23/14 revealing an EF of 30-35%. He does not have symptoms of heart failure. He does have an ICD placed for primary prevention followed by Dr. Sallyanne Kuster and is on appropriate medications.

## 2015-11-23 NOTE — Progress Notes (Signed)
11/23/2015 [name]   06-11-1945  [id number]  Primary Physician Wenda Low, MD Primary Cardiologist: Lorretta Harp MD Renae Gloss  HPI:    [name] is a 70 year old thin-appearing African American male history of CAD status post bypass grafting x3 in 2006 with a LIMA to his LAD, vein to circumflex and PDA. I last saw him in the office 10/06/14  His other problems include COPD with tobacco abuse, diabetes, hypertension, left bundle branch block and chronic kidney disease. He had an IV ICD placed in February 2014 followup by Dr. Sallyanne Kuster in our office. He denies chest pain or shortness of breath.recent carotid Doppler suggested a high-grade right internal carotid artery stenosis. Myoview stress test performed 09/02/13 showed no ischemia. He apparently is scheduled for upcoming surgical procedure which is certainly at high risk for given his carotid disease and his LV dysfunction. I referred him to Dr. Trula Slade for vascular surgical evaluation. He agreed that [name] was a better stent candidate. He has been on aspirin and Plavix. He will need his tonsillar cancer evaluated by Dr. Erik Obey Post carotid stenting. I H. Fatima Sanger him 10/20/13 revealing an 80% proximal right internal carotid artery stenosis which which I stented along with Dr. Trula Slade using a 9 x 7 x 30 XACT Nitinol self-expanding stent and distal protection. He is on dual antiplatelet therapy.follow-up carotid Dopplers performed 06/08/14 revealed his carotids have to be widely patent. He was recently admitted with volume overload/congestive heart failure requiring IV diuresis from 06/08/14 through the 31st. He saw Margarite Gouge PA-C in the [facility] office yesterday with recurring volume overload and his diuretics were adjusted. he currently denies chest pain or shortness of breath. His major issues are with left knee and foot pain. He did see his podiatrist, Dr. Servando Salina who referred him back here for further  evaluation. His symptoms do not sound like claudication but more like arthritis, potentially gout.  Current Outpatient Prescriptions  Medication Sig Dispense Refill  . allopurinol (ZYLOPRIM) 100 MG tablet Take 100 mg by mouth daily as needed (For gout.).     Marland Kitchen amLODipine (NORVASC) 5 MG tablet TAKE ONE TABLET BY MOUTH IN THE MORNING 30 tablet 6  . aspirin EC 81 MG tablet Take 81 mg by mouth daily.    Marland Kitchen atorvastatin (LIPITOR) 40 MG tablet Take 40 mg by mouth every morning.     . carvedilol (COREG) 12.5 MG tablet TAKE ONE TABLET BY MOUTH TWICE DAILY 60 tablet 3  . colchicine 0.6 MG tablet Take 1 tablet (0.6 mg total) by mouth 2 (two) times daily. 12 tablet 0  . docusate sodium (COLACE) 100 MG capsule Take 1 capsule (100 mg total) by mouth 2 (two) times daily. Hold for diarrhea. 60 capsule 1  . glimepiride (AMARYL) 1 MG tablet Take 1 tablet by mouth daily. Reported on 03/10/2015    . hydrALAZINE (APRESOLINE) 50 MG tablet TAKE ONE & ONE-HALF TABLETS BY MOUTH THREE TIMES DAILY 135 tablet 6  . HYDROcodone-acetaminophen (NORCO/VICODIN) 5-325 MG tablet Take 1 tablet by mouth every 4 (four) hours as needed for moderate pain. 10 tablet 0  . isosorbide mononitrate (IMDUR) 60 MG 24 hr tablet TAKE ONE & ONE-HALF TABLETS BY MOUTH ONCE DAILY 45 tablet 6  . oxyCODONE-acetaminophen (ROXICET) 5-325 MG tablet Take 1-2 tablets by mouth every 4 (four) hours as needed for severe pain. 31 tablet 0  . potassium chloride SA (K-DUR,KLOR-CON) 20 MEQ tablet Take 1 tablet (20 mEq total)  by mouth daily. 15 tablet 0  . sulfamethoxazole-trimethoprim (BACTRIM) 400-80 MG tablet Take 1 tablet by mouth 2 (two) times daily. 20 tablet 0  . torsemide (DEMADEX) 20 MG tablet TAKE FOUR TABLETS BY MOUTH EVERY OTHER DAY ALTERNATING  WITH  3  TABLETS  EVERY  OTHER  DAY 120 tablet 6  . traZODone (DESYREL) 150 MG tablet Take 150 mg by mouth at bedtime as needed for sleep.      No current facility-administered medications for this visit.      No Known Allergies  Social History   Social History  . Marital status: Divorced    Spouse name: N/A  . Number of children: N/A  . Years of education: N/A   Occupational History  . Not on file.   Social History Main Topics  . Smoking status: Former Smoker    Quit date: 04/18/1982  . Smokeless tobacco: Never Used  . Alcohol use No  . Drug use: No     Comment: stopped three years ago  . Sexual activity: No   Other Topics Concern  . Not on file   Social History Narrative  . No narrative on file     Review of Systems: General: negative for chills, fever, night sweats or weight changes.  Cardiovascular: negative for chest pain, dyspnea on exertion, edema, orthopnea, palpitations, paroxysmal nocturnal dyspnea or shortness of breath Dermatological: negative for rash Respiratory: negative for cough or wheezing Urologic: negative for hematuria Abdominal: negative for nausea, vomiting, diarrhea, bright red blood per rectum, melena, or hematemesis Neurologic: negative for visual changes, syncope, or dizziness All other systems reviewed and are otherwise negative except as noted above.    Blood pressure (!) 142/60, pulse 64, height 5\' 8"  (1.727 m), weight 180 lb 12.8 oz (82 kg).  General appearance: alert and no distress Neck: no adenopathy, no JVD, supple, symmetrical, trachea midline, thyroid not enlarged, symmetric, no tenderness/mass/nodules and Soft bilateral carotid bruits Lungs: clear to auscultation bilaterally Heart: regular rate and rhythm, S1, S2 normal, no murmur, click, rub or gallop Extremities: extremities normal, atraumatic, no cyanosis or edema and 1+ pedal pulses bilaterally  EKG not performed today  ASSESSMENT AND PLAN:   Cardiomyopathy, ischemic History of ischemic cardiomyopathy with his last echo performed 11/23/14 revealing an EF of 30-35%. He does not have symptoms of heart failure. He does have an ICD placed for primary prevention followed by Dr.  Sallyanne Kuster and is on appropriate medications.  Essential hypertension History of hypertension with blood pressures measured at 142/60. He is on amlodipine, carvedilol, hydralazine. Continue current meds current dosing. He is not on an ACE inhibitor or ARB because of chronic renal insufficiency.  CAD (coronary artery disease) History of coronary artery disease status post coronary artery bypass grafting X 3 in 2006 with a LIMA to his LAD, vein to the circumflex and PDA. A Myoview performed 09/02/13 showed no ischemia. He denies chest pain or shortness of breath.  Carotid artery disease History of carotid artery disease status post right internal carotid artery stenting by myself 10/20/13. His most recent Dopplers performed 10/24/15 revealed a widely patent stent with mild to moderate left ICA stenosis.  Hyperlipidemia History of hyperlipidemia on statin therapy with recent lipid profile performed 01/24/15 revealing total cholesterol 86, LDL 34 and HDL of [place] MD [facility] 11/23/2015 9:43 AM

## 2015-11-23 NOTE — Assessment & Plan Note (Signed)
History of hyperlipidemia on statin therapy with recent lipid profile performed 01/24/15 revealing total cholesterol 86, LDL 34 and HDL of 39

## 2015-11-24 ENCOUNTER — Other Ambulatory Visit (HOSPITAL_COMMUNITY): Payer: Self-pay | Admitting: Cardiology

## 2015-11-24 ENCOUNTER — Other Ambulatory Visit: Payer: Self-pay | Admitting: Cardiovascular Disease

## 2015-11-24 DIAGNOSIS — M79606 Pain in leg, unspecified: Secondary | ICD-10-CM

## 2015-11-28 DIAGNOSIS — M545 Low back pain: Secondary | ICD-10-CM | POA: Diagnosis not present

## 2015-11-28 DIAGNOSIS — M160 Bilateral primary osteoarthritis of hip: Secondary | ICD-10-CM | POA: Diagnosis not present

## 2015-11-28 DIAGNOSIS — M170 Bilateral primary osteoarthritis of knee: Secondary | ICD-10-CM | POA: Diagnosis not present

## 2015-12-02 ENCOUNTER — Encounter (HOSPITAL_COMMUNITY): Payer: Medicare Other

## 2015-12-02 ENCOUNTER — Ambulatory Visit (HOSPITAL_COMMUNITY)
Admission: RE | Admit: 2015-12-02 | Discharge: 2015-12-02 | Disposition: A | Discharge status: Home / Self Care | Payer: Medicare Other | Source: Ambulatory Visit | Attending: Cardiology | Admitting: Cardiology

## 2015-12-02 DIAGNOSIS — M79606 Pain in leg, unspecified: Secondary | ICD-10-CM

## 2015-12-02 DIAGNOSIS — I70292 Other atherosclerosis of native arteries of extremities, left leg: Secondary | ICD-10-CM | POA: Insufficient documentation

## 2015-12-02 DIAGNOSIS — I998 Other disorder of circulatory system: Secondary | ICD-10-CM | POA: Insufficient documentation

## 2015-12-02 DIAGNOSIS — I743 Embolism and thrombosis of arteries of the lower extremities: Secondary | ICD-10-CM | POA: Diagnosis not present

## 2015-12-02 DIAGNOSIS — I739 Peripheral vascular disease, unspecified: Secondary | ICD-10-CM | POA: Diagnosis not present

## 2015-12-12 DIAGNOSIS — M109 Gout, unspecified: Secondary | ICD-10-CM | POA: Diagnosis not present

## 2015-12-27 ENCOUNTER — Encounter: Payer: Self-pay | Admitting: Cardiovascular Disease

## 2015-12-27 ENCOUNTER — Ambulatory Visit (INDEPENDENT_AMBULATORY_CARE_PROVIDER_SITE_OTHER): Payer: Medicare Other | Admitting: Cardiovascular Disease

## 2015-12-27 VITALS — BP 106/56 | HR 60 | Ht 67.0 in | Wt 184.0 lb

## 2015-12-27 DIAGNOSIS — I10 Essential (primary) hypertension: Secondary | ICD-10-CM

## 2015-12-27 DIAGNOSIS — I739 Peripheral vascular disease, unspecified: Secondary | ICD-10-CM

## 2015-12-27 DIAGNOSIS — I255 Ischemic cardiomyopathy: Secondary | ICD-10-CM

## 2015-12-27 DIAGNOSIS — I779 Disorder of arteries and arterioles, unspecified: Secondary | ICD-10-CM

## 2015-12-27 NOTE — Patient Instructions (Signed)
Medication Instructions:  NO CHANGES.   Testing/Procedures: Your physician has requested that you have a carotid duplex. This test is an ultrasound of the carotid arteries in your neck. It looks at blood flow through these arteries that supply the brain with blood. Allow one hour for this exam. There are no restrictions or special instructions.  IN ONE YEAR PRIOR TO APPOINTMENT WITH DR BERRY.  Your physician has requested that you have a lower extremity arterial doppler- During this test, ultrasound is used to evaluate arterial blood flow in the legs. Allow approximately one hour for this exam. IN ONE YEAR PRIOR TO APPOINTMENT WITH DR BERRY.   Follow-Up: Your physician wants you to follow-up in: 12 MONTHS WITH DR Gwenlyn Found AFTER TESTING. You will receive a reminder letter in the mail two months in advance. If you don't receive a letter, please call our office to schedule the follow-up appointment.   If you need a refill on your cardiac medications before your next appointment, please call your pharmacy.
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
## 2015-12-28 ENCOUNTER — Telehealth: Payer: Self-pay | Admitting: Cardiology

## 2015-12-28 ENCOUNTER — Ambulatory Visit (INDEPENDENT_AMBULATORY_CARE_PROVIDER_SITE_OTHER): Payer: Medicare Other | Admitting: *Deleted

## 2015-12-28 DIAGNOSIS — I255 Ischemic cardiomyopathy: Secondary | ICD-10-CM

## 2015-12-28 NOTE — Telephone Encounter (Signed)
Spoke with pt and reminded pt of remote transmission that is due today. Pt verbalized understanding.   

## 2015-12-28 NOTE — Progress Notes (Signed)
Remote ICD transmission.   

## 2015-12-29 ENCOUNTER — Encounter: Payer: Self-pay | Admitting: Cardiology

## 2016-01-10 DIAGNOSIS — M10062 Idiopathic gout, left knee: Secondary | ICD-10-CM | POA: Diagnosis not present

## 2016-01-10 DIAGNOSIS — M10071 Idiopathic gout, right ankle and foot: Secondary | ICD-10-CM | POA: Diagnosis not present

## 2016-01-20 ENCOUNTER — Inpatient Hospital Stay (HOSPITAL_COMMUNITY): Admission: RE | Admit: 2016-01-20 | Payer: Medicare Other | Source: Ambulatory Visit

## 2016-01-24 LAB — CUP PACEART REMOTE DEVICE CHECK
Battery Remaining Longevity: 106 mo
Brady Statistic AS VP Percent: 88.46 %
Brady Statistic RA Percent Paced: 10.02 %
Date Time Interrogation Session: 20171018154630
HIGH POWER IMPEDANCE MEASURED VALUE: 54 Ohm
Implantable Lead Implant Date: 20140207
Implantable Lead Implant Date: 20140207
Implantable Lead Location: 753858
Implantable Lead Location: 753859
Implantable Pulse Generator Implant Date: 20170105
Lead Channel Impedance Value: 304 Ohm
Lead Channel Impedance Value: 361 Ohm
Lead Channel Impedance Value: 361 Ohm
Lead Channel Impedance Value: 361 Ohm
Lead Channel Impedance Value: 418 Ohm
Lead Channel Impedance Value: 513 Ohm
Lead Channel Impedance Value: 551 Ohm
Lead Channel Impedance Value: 551 Ohm
Lead Channel Impedance Value: 646 Ohm
Lead Channel Pacing Threshold Amplitude: 0.625 V
Lead Channel Pacing Threshold Pulse Width: 0.4 ms
Lead Channel Pacing Threshold Pulse Width: 0.4 ms
Lead Channel Pacing Threshold Pulse Width: 0.4 ms
Lead Channel Sensing Intrinsic Amplitude: 16 mV
Lead Channel Setting Pacing Amplitude: 1.5 V
Lead Channel Setting Pacing Amplitude: 2 V
Lead Channel Setting Pacing Pulse Width: 0.4 ms
Lead Channel Setting Pacing Pulse Width: 0.4 ms
Lead Channel Setting Sensing Sensitivity: 0.3 mV
MDC IDC LEAD IMPLANT DT: 20170105
MDC IDC LEAD LOCATION: 753860
MDC IDC LEAD MODEL: 4298
MDC IDC LEAD MODEL: 6935
MDC IDC MSMT BATTERY VOLTAGE: 3 V
MDC IDC MSMT LEADCHNL LV IMPEDANCE VALUE: 247 Ohm
MDC IDC MSMT LEADCHNL LV IMPEDANCE VALUE: 342 Ohm
MDC IDC MSMT LEADCHNL LV IMPEDANCE VALUE: 342 Ohm
MDC IDC MSMT LEADCHNL LV IMPEDANCE VALUE: 608 Ohm
MDC IDC MSMT LEADCHNL LV PACING THRESHOLD AMPLITUDE: 1 V
MDC IDC MSMT LEADCHNL RA SENSING INTR AMPL: 1.875 mV
MDC IDC MSMT LEADCHNL RA SENSING INTR AMPL: 1.875 mV
MDC IDC MSMT LEADCHNL RV PACING THRESHOLD AMPLITUDE: 0.625 V
MDC IDC MSMT LEADCHNL RV SENSING INTR AMPL: 16 mV
MDC IDC SET LEADCHNL LV PACING AMPLITUDE: 2 V
MDC IDC STAT BRADY AP VP PERCENT: 9.85 %
MDC IDC STAT BRADY AP VS PERCENT: 0.18 %
MDC IDC STAT BRADY AS VS PERCENT: 1.52 %
MDC IDC STAT BRADY RV PERCENT PACED: 9.02 %

## 2016-01-25 ENCOUNTER — Inpatient Hospital Stay (HOSPITAL_COMMUNITY): Admission: RE | Admit: 2016-01-25 | Payer: Medicare Other | Source: Ambulatory Visit

## 2016-02-08 ENCOUNTER — Ambulatory Visit (HOSPITAL_COMMUNITY)
Admission: RE | Admit: 2016-02-08 | Discharge: 2016-02-08 | Disposition: A | Discharge status: Home / Self Care | Payer: Medicare Other | Source: Ambulatory Visit | Attending: Cardiology | Admitting: Cardiology

## 2016-02-08 ENCOUNTER — Telehealth (HOSPITAL_COMMUNITY): Payer: Self-pay | Admitting: Vascular Surgery

## 2016-02-08 ENCOUNTER — Encounter (HOSPITAL_COMMUNITY): Payer: Self-pay

## 2016-02-08 VITALS — BP 120/62 | HR 64 | Wt 173.0 lb

## 2016-02-08 DIAGNOSIS — I10 Essential (primary) hypertension: Secondary | ICD-10-CM

## 2016-02-08 DIAGNOSIS — Z87891 Personal history of nicotine dependence: Secondary | ICD-10-CM | POA: Insufficient documentation

## 2016-02-08 DIAGNOSIS — N184 Chronic kidney disease, stage 4 (severe): Secondary | ICD-10-CM | POA: Insufficient documentation

## 2016-02-08 DIAGNOSIS — I5022 Chronic systolic (congestive) heart failure: Secondary | ICD-10-CM | POA: Diagnosis not present

## 2016-02-08 DIAGNOSIS — Z7982 Long term (current) use of aspirin: Secondary | ICD-10-CM | POA: Insufficient documentation

## 2016-02-08 DIAGNOSIS — Z5189 Encounter for other specified aftercare: Secondary | ICD-10-CM | POA: Diagnosis not present

## 2016-02-08 DIAGNOSIS — Z951 Presence of aortocoronary bypass graft: Secondary | ICD-10-CM | POA: Insufficient documentation

## 2016-02-08 DIAGNOSIS — I5042 Chronic combined systolic (congestive) and diastolic (congestive) heart failure: Secondary | ICD-10-CM

## 2016-02-08 DIAGNOSIS — E1122 Type 2 diabetes mellitus with diabetic chronic kidney disease: Secondary | ICD-10-CM | POA: Diagnosis not present

## 2016-02-08 DIAGNOSIS — I130 Hypertensive heart and chronic kidney disease with heart failure and stage 1 through stage 4 chronic kidney disease, or unspecified chronic kidney disease: Secondary | ICD-10-CM | POA: Diagnosis not present

## 2016-02-08 DIAGNOSIS — Z9889 Other specified postprocedural states: Secondary | ICD-10-CM | POA: Insufficient documentation

## 2016-02-08 DIAGNOSIS — Z79899 Other long term (current) drug therapy: Secondary | ICD-10-CM | POA: Diagnosis not present

## 2016-02-08 DIAGNOSIS — I2510 Atherosclerotic heart disease of native coronary artery without angina pectoris: Secondary | ICD-10-CM | POA: Insufficient documentation

## 2016-02-08 DIAGNOSIS — Z95810 Presence of automatic (implantable) cardiac defibrillator: Secondary | ICD-10-CM | POA: Diagnosis not present

## 2016-02-08 DIAGNOSIS — J449 Chronic obstructive pulmonary disease, unspecified: Secondary | ICD-10-CM

## 2016-02-08 DIAGNOSIS — I255 Ischemic cardiomyopathy: Secondary | ICD-10-CM

## 2016-02-08 LAB — CBC
HCT: 30.9 % — ABNORMAL LOW (ref 39.0–52.0)
Hemoglobin: 10.1 g/dL — ABNORMAL LOW (ref 13.0–17.0)
MCH: 29.6 pg (ref 26.0–34.0)
MCHC: 32.7 g/dL (ref 30.0–36.0)
MCV: 90.6 fL (ref 78.0–100.0)
PLATELETS: 147 10*3/uL — AB (ref 150–400)
RBC: 3.41 MIL/uL — ABNORMAL LOW (ref 4.22–5.81)
RDW: 15.4 % (ref 11.5–15.5)
WBC: 3.4 10*3/uL — AB (ref 4.0–10.5)

## 2016-02-08 LAB — BASIC METABOLIC PANEL
Anion gap: 8 (ref 5–15)
BUN: 49 mg/dL — AB (ref 6–20)
CO2: 24 mmol/L (ref 22–32)
CREATININE: 2.51 mg/dL — AB (ref 0.61–1.24)
Calcium: 10.1 mg/dL (ref 8.9–10.3)
Chloride: 104 mmol/L (ref 101–111)
GFR calc Af Amer: 28 mL/min — ABNORMAL LOW (ref >60)
GFR, EST NON AFRICAN AMERICAN: 25 mL/min — AB (ref >60)
Glucose, Bld: 115 mg/dL — ABNORMAL HIGH (ref 65–99)
Potassium: 4.2 mmol/L (ref 3.5–5.1)
SODIUM: 136 mmol/L (ref 135–145)

## 2016-02-08 LAB — BRAIN NATRIURETIC PEPTIDE: B NATRIURETIC PEPTIDE 5: 183.6 pg/mL — AB (ref 0.0–100.0)

## 2016-02-08 MED ORDER — HYDRALAZINE HCL 50 MG PO TABS: 100.0000 mg | ORAL_TABLET | Freq: Three times a day (TID) | ORAL | 6 refills | Status: DC

## 2016-02-08 NOTE — Patient Instructions (Signed)
INCREASE Hydralazine to 100 mg, one tab three times per day  Your physician recommends that you schedule a follow-up appointment in: 2-3 months with Dr Aundra Dubin  Your physician has requested that you have an echocardiogram. Echocardiography is a painless test that uses sound waves to create images of your heart. It provides your doctor with information about the size and shape of your heart and how well your heart's chambers and valves are working. This procedure takes approximately one hour. There are no restrictions for this procedure.

## 2016-02-08 NOTE — Telephone Encounter (Signed)
Left message to make f/u apt / echo asap

## 2016-02-08 NOTE — Progress Notes (Signed)
Patient ID: [name], male   DOB: [birth year], 70 y.o.   MRN: [id number]     Advanced Heart Failure Clinic Note   PCP: Dr Lysle Rubens Nephrology: Dr. Florene Glen Cardiology: Dr. Sallyanne Kuster HF Cardiology: Dr Aundra Dubin  HPI: [name] is a 70 y.o. with Chronic combined CHF 30-35% s/p Medtronic ICD placement 2/14, CAD s/p CABG x 3, COPD, DM, HTN, LBB, Carotid artery disease s/p stent 8/15 on DAPT, and CKD stage 3-4.   Admitted to [facility] 9/12 through 11/26/14. Diuresed with lasix drip and later transitioned to torsemide 60 mg daily. No ACEI, spironolactone, or digoxin with CKD. Discharge weight was 195 pounds.  Echo in 9/16 with EF 30-35%.  Medtronic CRT-D upgrade in 1/17.   He presents today for regular follow up. Last seen in February. At that time 198 lbs. Today weighs 173 lbs. Feeling good overall. Mostly limited by his R foot pain. Follows with Dr. Gwenlyn Found. Not planning any procedures at this time. Can't walk far/fast enough to get SOB. No SOB with ADLs. No CP/Orthopnea/PND.   Labs (11/16): K 4.8 => 4.5, creatinine 3.62 => 2.97, BNP 287, LDL 34, HDL 39 Labs (1/17): K 4.9, creatinine 3.19  ROS: All systems negative except as listed in HPI, PMH and Problem List.  PMH: 1. CAD: s/p CABG with LIMA-LAD, SVG-LCx, SVG-PDA.  Cardiolite 6/15 with no ischemia.  2. Chronic systolic CHF: Ischemic CMP.  Medtronic ICD with CRT upgrade in 1/17.  Echo (9/16) with EF 30-35%, regional WMAs, mild MR, mildly decreased RV systolic function.  3. COPD 4. LBBB 5. Carotid stenosis: Carotid stent in 8/15.  6. CKD 7. Type II diabetes 8. HTN 9. Hyperlipidemia  SH:  Social History   Social History  . Marital status: Divorced    Spouse name: N/A  . Number of children: N/A  . Years of education: N/A   Occupational History  . Not on file.   Social History Main Topics  . Smoking status: Former Smoker    Quit date: 04/18/1982  . Smokeless tobacco: Never Used  . Alcohol use No  . Drug use: No     Comment: stopped  three years ago  . Sexual activity: No   Other Topics Concern  . Not on file   Social History Narrative  . No narrative on file    FH:  Family History  Problem Relation Age of Onset  . Cancer Mother   . Hypertension Mother   . Cancer Sister   . Diabetes Brother     Current Outpatient Prescriptions  Medication Sig Dispense Refill  . allopurinol (ZYLOPRIM) 100 MG tablet Take 100 mg by mouth daily as needed (For gout.).     Marland Kitchen amLODipine (NORVASC) 5 MG tablet TAKE ONE TABLET BY MOUTH IN THE MORNING 30 tablet 6  . aspirin EC 81 MG tablet Take 81 mg by mouth daily.    Marland Kitchen atorvastatin (LIPITOR) 40 MG tablet Take 40 mg by mouth every morning.     . carvedilol (COREG) 12.5 MG tablet TAKE ONE TABLET BY MOUTH TWICE DAILY 60 tablet 3  . docusate sodium (COLACE) 100 MG capsule Take 1 capsule (100 mg total) by mouth 2 (two) times daily. Hold for diarrhea. 60 capsule 1  . glimepiride (AMARYL) 1 MG tablet Take 1 tablet by mouth daily. Reported on 03/10/2015    . hydrALAZINE (APRESOLINE) 50 MG tablet TAKE ONE & ONE-HALF TABLETS BY MOUTH THREE TIMES DAILY 135 tablet 6  . HYDROcodone-acetaminophen (NORCO/VICODIN) 5-325 MG tablet Take  1 tablet by mouth every 4 (four) hours as needed for moderate pain. 10 tablet 0  . isosorbide mononitrate (IMDUR) 60 MG 24 hr tablet TAKE ONE & ONE-HALF TABLETS BY MOUTH ONCE DAILY 45 tablet 6  . oxyCODONE-acetaminophen (ROXICET) 5-325 MG tablet Take 1-2 tablets by mouth every 4 (four) hours as needed for severe pain. 31 tablet 0  . potassium chloride SA (K-DUR,KLOR-CON) 20 MEQ tablet Take 1 tablet (20 mEq total) by mouth daily. 15 tablet 0  . torsemide (DEMADEX) 20 MG tablet TAKE FOUR TABLETS BY MOUTH EVERY OTHER DAY ALTERNATING  WITH  3  TABLETS  EVERY  OTHER  DAY 120 tablet 6  . traZODone (DESYREL) 150 MG tablet Take 150 mg by mouth at bedtime as needed for sleep.      No current facility-administered medications for this encounter.     Vitals:   02/08/16 1211    BP: 120/62  Pulse: 64  SpO2: 94%  Weight: 173 lb (78.5 kg)   Wt Readings from Last 3 Encounters:  02/08/16 173 lb (78.5 kg)  12/27/15 184 lb (83.5 kg)  11/23/15 180 lb 12.8 oz (82 kg)     PHYSICAL EXAM:  General:  70 y.o. appearing.  NAD HEENT: Normal effort Neck: supple. JVP flat. Carotids 2+ bilaterally; no bruits. No thyromegaly or nodule noted.  Cor: PMI normal. RRR. No M/G/R. Lungs: CTAB, normal effort Abdomen: soft, NT, ND, no HSM. No bruits or masses. +BS  Extremities: no cyanosis, clubbing, rash.  1+ ankle edema Neuro: alert & orientedx3, cranial nerves grossly intact. Moves all 4 extremities w/o difficulty. Affect pleasant.  ASSESSMENT & PLAN: 1. Chronic systolic CHF:  Echo 7/93/9030 with LVEF 30-35%, Grade 2 DD, PA peak pressure 53, RV systolic function mildly reduced. Ischemic cardiomyopathy. S/p Medtronic ICD, now upgraded to CRT-D. NYHA class II symptoms. Mostly limited by PAD. Volume status looks great on exam.  - Continue current Coreg.   - Increase hydralazine to 100 mg tid and imdur 90 mg daily   - No ACEI or spironolactone with CKD stage IV.  - Continue torsemide 80 mg daily alternating with 60 mg daily. Working well for him. Labs today 2. CAD: s/p CABG 2006 with a LIMA to LAD, veins to the circumflex and PDA. Cardiolite performed 09/02/13 showed no ischemia. Denies CP.  - Continue ASA, Plavix, statin.  3. CKD stage 4:  - BMET today. Renal follows.    4. Hyperlipidemia: Good LDL 11/16.  5. Hypertension:  - Increasing to goal dose hydralazine.  6. Carotid artery disease: Stent placed 8/15. On ASA and plavix - [place] 3/16 showed widely patent carotids bilaterally.   Stable from HF perspective. Follow up 2-3 months with MD.  Meds and Labs as above.  Repeat Echo, as it has been > 1 year.   Shirley Friar, PA-C 02/08/2016  Total time spent > 25 minutes. Over half that spent discussing the above.

## 2016-02-21 DIAGNOSIS — M109 Gout, unspecified: Secondary | ICD-10-CM | POA: Diagnosis not present

## 2016-04-10 ENCOUNTER — Other Ambulatory Visit (HOSPITAL_COMMUNITY): Payer: Self-pay | Admitting: Cardiology

## 2016-04-11 ENCOUNTER — Other Ambulatory Visit (HOSPITAL_COMMUNITY): Payer: Self-pay | Admitting: *Deleted

## 2016-04-16 ENCOUNTER — Other Ambulatory Visit (HOSPITAL_COMMUNITY): Payer: Self-pay | Admitting: Pharmacist

## 2016-04-16 MED ORDER — CLOPIDOGREL BISULFATE 75 MG PO TABS: 75.0000 mg | ORAL_TABLET | Freq: Every day | ORAL | 3 refills | Status: DC

## 2016-04-23 ENCOUNTER — Other Ambulatory Visit (HOSPITAL_COMMUNITY): Payer: Self-pay

## 2016-04-23 NOTE — Progress Notes (Addendum)
Patient Outreach     Patient ID: [name], male    DOB: Jan 18, 1946, 71 y.o.   MRN: [id number]    Patient Care Team: Wenda Low, MD as PCP - General (Internal Medicine) Lorretta Harp, MD as Consulting Physician (Cardiology)  Patient Active Problem List   Diagnosis Date Noted  . Peripheral arterial disease ([place]) 12/27/2015  . Erectile dysfunction 08/29/2015  . CKD (chronic kidney disease), stage IV ([place]) 01/25/2015  . Diabetes mellitus due to underlying condition with diabetic chronic kidney disease ([place])   . Chronic kidney disease (CKD), stage IV (severe) ([place]) 11/24/2014  . DM (diabetes mellitus), type 2 with renal complications ([place]) 25/42/7062  . Chronic combined systolic and diastolic CHF (congestive heart failure) ([place]) 07/19/2014  . Hypoxia   . CHF exacerbation ([place]) 12/15/2013  . History of carotid artery stenosis 10/05/2013  . Carotid artery disease ([place]) 09/08/2013  . Hyperlipidemia 09/08/2013  . CHF (congestive heart failure) ([place]) 06/02/2012  . Essential hypertension 06/02/2012  . At risk for sudden cardiac death 29-Apr-2012  . Cardiomyopathy, ischemic 04-29-12  . S/P ICD (internal cardiac defibrillator) procedure, 04-29-2012, Medtronic device ro ICM 2012-04-29  . CKD (chronic kidney disease) stage 3, GFR 30-59 ml/min 04/03/2012  . COPD (chronic obstructive pulmonary disease) ([place]) 04/03/2012  . Pulmonary edema  04/01/2012  . Chest pain at rest 04/01/2012  . Diabetes mellitus due to underlying condition ([place]) 11/01/2006  . ABUSE, COCAINE, EPISODIC 11/01/2006  . Polysubstance abuse 11/01/2006  . MYOCARDIAL INFARCTION, HX OF 11/01/2006  . CAD (coronary artery disease) 11/01/2006    Current Outpatient Prescriptions:  .  allopurinol (ZYLOPRIM) 100 MG tablet, Take 300 mg by mouth daily as needed (For gout.). , Disp: , Rfl:  .  amLODipine (NORVASC) 5 MG tablet, TAKE ONE TABLET BY MOUTH IN THE MORNING, Disp: 30 tablet, Rfl: 6 .  aspirin EC 81 MG tablet, Take 81 mg  by mouth daily., Disp: , Rfl:  .  atorvastatin (LIPITOR) 40 MG tablet, Take 40 mg by mouth every morning. , Disp: , Rfl:  .  carvedilol (COREG) 12.5 MG tablet, TAKE ONE TABLET BY MOUTH TWICE DAILY, Disp: 60 tablet, Rfl: 3 .  clopidogrel (PLAVIX) 75 MG tablet, Take 1 tablet (75 mg total) by mouth daily., Disp: 90 tablet, Rfl: 3 .  docusate sodium (COLACE) 100 MG capsule, Take 1 capsule (100 mg total) by mouth 2 (two) times daily. Hold for diarrhea. (Patient taking differently: Take 100 mg by mouth daily as needed. Hold for diarrhea.), Disp: 60 capsule, Rfl: 1 .  glimepiride (AMARYL) 1 MG tablet, Take 1 tablet by mouth daily. Reported on 03/10/2015, Disp: , Rfl:  .  hydrALAZINE (APRESOLINE) 50 MG tablet, Take 2 tablets (100 mg total) by mouth 3 (three) times daily., Disp: 160 tablet, Rfl: 6 .  isosorbide mononitrate (IMDUR) 60 MG 24 hr tablet, TAKE ONE & ONE-HALF TABLETS BY MOUTH ONCE DAILY, Disp: 45 tablet, Rfl: 6 .  oxyCODONE-acetaminophen (ROXICET) 5-325 MG tablet, Take 1-2 tablets by mouth every 4 (four) hours as needed for severe pain., Disp: 31 tablet, Rfl: 0 .  potassium chloride SA (K-DUR,KLOR-CON) 20 MEQ tablet, Take 1 tablet (20 mEq total) by mouth daily., Disp: 15 tablet, Rfl: 0 .  torsemide (DEMADEX) 20 MG tablet, TAKE FOUR TABLETS BY MOUTH EVERY OTHER DAY ALTERNATING  WITH  3  TABLETS  EVERY  OTHER  DAY, Disp: 120 tablet, Rfl: 6 .  traZODone (DESYREL) 150 MG tablet, Take 150 mg by mouth at bedtime as  needed for sleep. , Disp: , Rfl:  .  HYDROcodone-acetaminophen (NORCO/VICODIN) 5-325 MG tablet, Take 1 tablet by mouth every 4 (four) hours as needed for moderate pain. (Patient not taking: Reported on 04/23/2016), Disp: 10 tablet, Rfl: 0 No Known Allergies   Social History   Social History  . Marital status: Divorced    Spouse name: N/A  . Number of children: N/A  . Years of education: N/A   Occupational History  . Not on file.   Social History Main Topics  . Smoking status: Former  Smoker    Quit date: 04/18/1982  . Smokeless tobacco: Never Used  . Alcohol use No  . Drug use: No     Comment: stopped three years ago  . Sexual activity: No   Other Topics Concern  . Not on file   Social History Narrative  . No narrative on file    Physical Exam  Constitutional: He is oriented to person, place, and time. He appears well-developed.  Eyes: Pupils are equal, round, and reactive to light.  Neck: Normal range of motion.  Cardiovascular: Normal rate.   Pulmonary/Chest: Breath sounds normal.  Abdominal: Soft.  Musculoskeletal: Normal range of motion. He exhibits no edema.  Neurological: He is oriented to person, place, and time.  Skin: Skin is warm and dry.  Psychiatric: He has a normal mood and affect.        [place] - 04/23/16 1630      Situation   Admitting diagnosis chf   Heart failure history Exisiting   Comorbidities CKD/renal insufficiency;COPD;DM;HTN   Readmitted within 30 days No   Hospital admission within past 12 months No     Assessment   Lives alone Yes   Primary support person wife/ex   Mode of transportation personal car;public bus;family/friends   Other services involved None   Home equipement Cane;Scale     Weight   Weighs self daily Yes   Scale provided No  has own scale   Records on weight chart Yes     Resources   Has "Living better w/heart failure" book No   Has HF Zone tool No   Able to identify yellow zone signs/when to call MD Yes   Records zone daily No     Medications   Uses a pill box Yes   Who stocks the pill box paramedicine   Pill box checked this visit Yes   Pill box refilled this visit No  has isosorbide for 5 more doses-will come back to refill   Difficulty obtaining medications No   Mail order medications No   Missed one or more doses of medications per week No     Nutrition   Patient receives meals on wheels No   Patient follows low sodium diet Yes   Has foods at home that meet the current recommended  diet Yes   Patient follows low sugar/card diet Yes   Nutritional concerns/issues no     Activity Level   ADL's/Mobility Independent   How many feet can patient ambulate --  >49ft    Typical activity level active   Barriers none   Actvity tolerance: NYHA Class 2     Urine   Difficulty urinating No   Changes in urine None     Time spent with patient   Time spent with patient  [place] - 04/23/16 1630      Outside of House  Sidewalk and pathway to house is level and free from any hazards Yes   Driveway is free from debris/snow/ice Yes   Outside stairs are stable and have sturdy handrail N/A   Porch lights are working and provide adequate lighting Yes     Living Room   Furniture is of adequate height and offers arm rests that assist in getting up and down Yes   Floor is free from any clutter that would create tripping hazards Yes   All cords are either behind furniture or secured in a manner that does not cause trip hazards Yes   All rugs are secured to floor with double-sided tape No   Lighting is adequate to light room Yes   All lighting has an easily accessible on/off switch Yes   Phone is readily accessible near favorite seating areas Yes   Emergency numbers are printed near all phones in house Yes     Kitchen   Items used most often are within easy reach on low shelves Yes   Step stool is present, is sturdy and has a handrail N/A   Floor mats are non-slip tread and secured to floor Yes   Oven controls are within easy reach Yes   Kitchen lighting is adequate and easy to reach switches Yes   ABC fire extinguisher is located in kitchen Yes     [place] is properly secured to stairs and/or all wood is properly secured N/A   Handrail is present and sturdy N/A   Stairs are free from any clutter N/A   Stairway is adequately lit N/A     Bathroom   Tub and shower have a non-slip surface Yes   Tub and/or shower  have a grab bar for stability No   Toilet has a raised seat No   Grab bar is attached near toilet for assistance No   Pathway from bedroom to bathroom is free from clutter and well lit for ease of movement in the middle of the night Yes     Bedroom   Floor is free from clutter Yes   Light is near bed and is easy to turn on Yes   Phone is next to bed and within easy reach Yes   Flashlight is near bed in case of emergency No     General   Smoke detectors in all areas of the house (each floor) and tested Yes   CO detectors on each floor of house and tested No   Flashlights are handy throughout the home No   Resident has all medical information readily available and in an area emergency providers will easily find Yes   All heaters are away from any type of flammable material N/A     Overall Tips   Homeowner ha good non-skid shoes to move around house Yes   All assisted walking devices are readily accessible and in good condition Yes   There is a phone near the floor for ease of reach in case of a fall No   All O2 tubing is less than 50 ft. and is not a trip hazard N/A   Resident has had an annual hearing and vision check by a physician Yes   Resident has the proper hearing and visual aids prescribed and are in good working order Yes   All medications are properly stored and labeled to avoid confusion on dosage, time to take, and avoidance of missed doses Yes      Pt  reports he is feeling good today. Pt denies any sob, no h/a, no dizziness. No missed doses of meds.   BP 118/60   Pulse 66   Resp 16   Wt 169 lb (76.7 kg)   SpO2 96%   BMI 26.47 kg/m    Weight yesterday-168 Last visit weight-169  No future appointments.   ACTION: Home visit completed Next visit planned for 3-4 days to refill pill box once he p/u his meds.   Marylouise Stacks, EMT

## 2016-04-26 ENCOUNTER — Other Ambulatory Visit (HOSPITAL_COMMUNITY): Payer: Self-pay | Admitting: Pharmacist

## 2016-04-26 ENCOUNTER — Other Ambulatory Visit (HOSPITAL_COMMUNITY): Payer: Self-pay

## 2016-04-26 MED ORDER — HYDRALAZINE HCL 100 MG PO TABS: 100.0000 mg | ORAL_TABLET | Freq: Three times a day (TID) | ORAL | 5 refills | Status: DC

## 2016-04-26 NOTE — Progress Notes (Signed)
Paramedicine Encounter    Patient ID: [name], male    DOB: [birth year], 71 y.o.   MRN: [id number]   Patient Care Team: Wenda Low, MD as PCP - General (Internal Medicine) Lorretta Harp, MD as Consulting Physician (Cardiology)  Patient Active Problem List   Diagnosis Date Noted  . Peripheral arterial disease ([place]) 12/27/2015  . Erectile dysfunction 08/29/2015  . CKD (chronic kidney disease), stage IV ([place]) 01/25/2015  . Diabetes mellitus due to underlying condition with diabetic chronic kidney disease ([place])   . Chronic kidney disease (CKD), stage IV (severe) ([place]) 11/24/2014  . DM (diabetes mellitus), type 2 with renal complications ([place]) 40/98/1191  . Chronic combined systolic and diastolic CHF (congestive heart failure) ([place]) 07/19/2014  . Hypoxia   . CHF exacerbation ([place]) 12/15/2013  . History of carotid artery stenosis 10/05/2013  . Carotid artery disease ([place]) 09/08/2013  . Hyperlipidemia 09/08/2013  . CHF (congestive heart failure) ([place]) 06/02/2012  . Essential hypertension 06/02/2012  . At risk for sudden cardiac death 21-Apr-2012  . Cardiomyopathy, ischemic 21-Apr-2012  . S/P ICD (internal cardiac defibrillator) procedure, 04-21-12, Medtronic device ro ICM 04/21/2012  . CKD (chronic kidney disease) stage 3, GFR 30-59 ml/min 04/03/2012  . COPD (chronic obstructive pulmonary disease) ([place]) 04/03/2012  . Pulmonary edema  04/01/2012  . Chest pain at rest 04/01/2012  . Diabetes mellitus due to underlying condition ([place]) 11/01/2006  . ABUSE, COCAINE, EPISODIC 11/01/2006  . Polysubstance abuse 11/01/2006  . MYOCARDIAL INFARCTION, HX OF 11/01/2006  . CAD (coronary artery disease) 11/01/2006    Current Outpatient Prescriptions:  .  allopurinol (ZYLOPRIM) 100 MG tablet, Take 300 mg by mouth daily as needed (For gout.). , Disp: , Rfl:  .  amLODipine (NORVASC) 5 MG tablet, TAKE ONE TABLET BY MOUTH IN THE MORNING, Disp: 30 tablet, Rfl: 6 .  aspirin EC 81 MG tablet, Take 81  mg by mouth daily., Disp: , Rfl:  .  atorvastatin (LIPITOR) 40 MG tablet, Take 40 mg by mouth every morning. , Disp: , Rfl:  .  carvedilol (COREG) 12.5 MG tablet, TAKE ONE TABLET BY MOUTH TWICE DAILY, Disp: 60 tablet, Rfl: 3 .  clopidogrel (PLAVIX) 75 MG tablet, Take 1 tablet (75 mg total) by mouth daily., Disp: 90 tablet, Rfl: 3 .  docusate sodium (COLACE) 100 MG capsule, Take 1 capsule (100 mg total) by mouth 2 (two) times daily. Hold for diarrhea. (Patient taking differently: Take 100 mg by mouth daily as needed. Hold for diarrhea.), Disp: 60 capsule, Rfl: 1 .  glimepiride (AMARYL) 1 MG tablet, Take 1 tablet by mouth daily. Reported on 03/10/2015, Disp: , Rfl:  .  isosorbide mononitrate (IMDUR) 60 MG 24 hr tablet, TAKE ONE & ONE-HALF TABLETS BY MOUTH ONCE DAILY, Disp: 45 tablet, Rfl: 6 .  oxyCODONE-acetaminophen (ROXICET) 5-325 MG tablet, Take 1-2 tablets by mouth every 4 (four) hours as needed for severe pain., Disp: 31 tablet, Rfl: 0 .  potassium chloride SA (K-DUR,KLOR-CON) 20 MEQ tablet, Take 1 tablet (20 mEq total) by mouth daily., Disp: 15 tablet, Rfl: 0 .  torsemide (DEMADEX) 20 MG tablet, TAKE FOUR TABLETS BY MOUTH EVERY OTHER DAY ALTERNATING  WITH  3  TABLETS  EVERY  OTHER  DAY, Disp: 120 tablet, Rfl: 6 .  traZODone (DESYREL) 150 MG tablet, Take 150 mg by mouth at bedtime as needed for sleep. , Disp: , Rfl:  .  hydrALAZINE (APRESOLINE) 100 MG tablet, Take 1 tablet (100 mg total) by mouth 3 (three) times  daily., Disp: 90 tablet, Rfl: 5 .  HYDROcodone-acetaminophen (NORCO/VICODIN) 5-325 MG tablet, Take 1 tablet by mouth every 4 (four) hours as needed for moderate pain. (Patient not taking: Reported on 04/26/2016), Disp: 10 tablet, Rfl: 0 No Known Allergies   Social History   Social History  . Marital status: Divorced    Spouse name: N/A  . Number of children: N/A  . Years of education: N/A   Occupational History  . Not on file.   Social History Main Topics  . Smoking status:  Former Smoker    Quit date: 04/18/1982  . Smokeless tobacco: Never Used  . Alcohol use No  . Drug use: No     Comment: stopped three years ago  . Sexual activity: No   Other Topics Concern  . Not on file   Social History Narrative  . No narrative on file    Physical Exam  Constitutional: He is oriented to person, place, and time. He appears well-developed.  HENT:  Head: Normocephalic.  Eyes: Pupils are equal, round, and reactive to light.  Cardiovascular: Normal rate.   Pulmonary/Chest: Effort normal. He has wheezes.  Very mild end exp wheeze to left lower lobe  Abdominal: Soft.  Musculoskeletal: He exhibits no edema.  Neurological: He is oriented to person, place, and time.  Skin: Skin is warm and dry.  Psychiatric: He has a normal mood and affect.        SAFE - 04/23/16 1630      Situation   Admitting diagnosis chf   Heart failure history Exisiting   Comorbidities CKD/renal insufficiency;COPD;DM;HTN   Readmitted within 30 days No   Hospital admission within past 12 months No     Assessment   Lives alone Yes   Primary support person wife/ex   Mode of transportation personal car;public bus;family/friends   Other services involved None   Home equipement Cane;Scale     Weight   Weighs self daily Yes   Scale provided No  has own scale   Records on weight chart Yes     Resources   Has "Living better w/heart failure" book No   Has HF Zone tool No   Able to identify yellow zone signs/when to call MD Yes   Records zone daily No     Medications   Uses a pill box Yes   Who stocks the pill box paramedicine   Pill box checked this visit Yes   Pill box refilled this visit No  has isosorbide for 5 more doses-will come back to refill   Difficulty obtaining medications No   Mail order medications No   Missed one or more doses of medications per week No     Nutrition   Patient receives meals on wheels No   Patient follows low sodium diet Yes   Has foods at home  that meet the current recommended diet Yes   Patient follows low sugar/card diet Yes   Nutritional concerns/issues no     Activity Level   ADL's/Mobility Independent   Typical activity level active   Barriers none   Actvity tolerance: NYHA Class 2   How many feet can patient ambulate --  >66ft      Urine   Difficulty urinating No   Changes in urine None     Time spent with patient   Time spent with patient  [place] - 04/23/16 1630  Outside of [place] and pathway to house is level and free from any hazards Yes   Driveway is free from debris/snow/ice Yes   Outside stairs are stable and have sturdy handrail N/A   Porch lights are working and provide adequate lighting Yes     Living Room   Furniture is of adequate height and offers arm rests that assist in getting up and down Yes   Floor is free from any clutter that would create tripping hazards Yes   All cords are either behind furniture or secured in a manner that does not cause trip hazards Yes   All rugs are secured to floor with double-sided tape No   Lighting is adequate to light room Yes   All lighting has an easily accessible on/off switch Yes   Phone is readily accessible near favorite seating areas Yes   Emergency numbers are printed near all phones in house Yes     Kitchen   Items used most often are within easy reach on low shelves Yes   Step stool is present, is sturdy and has a handrail N/A   Floor mats are non-slip tread and secured to floor Yes   Oven controls are within easy reach Yes   Kitchen lighting is adequate and easy to reach switches Yes   ABC fire extinguisher is located in kitchen Yes     [place] is properly secured to stairs and/or all wood is properly secured N/A   Handrail is present and sturdy N/A   Stairs are free from any clutter N/A   Stairway is adequately lit N/A     Bathroom   Tub and shower have a non-slip  surface Yes   Tub and/or shower have a grab bar for stability No   Toilet has a raised seat No   Grab bar is attached near toilet for assistance No   Pathway from bedroom to bathroom is free from clutter and well lit for ease of movement in the middle of the night Yes     Bedroom   Floor is free from clutter Yes   Light is near bed and is easy to turn on Yes   Phone is next to bed and within easy reach Yes   Flashlight is near bed in case of emergency No     General   Smoke detectors in all areas of the house (each floor) and tested Yes   CO detectors on each floor of house and tested No   Flashlights are handy throughout the home No   Resident has all medical information readily available and in an area emergency providers will easily find Yes   All heaters are away from any type of flammable material N/A     Overall Tips   Homeowner ha good non-skid shoes to move around house Yes   All assisted walking devices are readily accessible and in good condition Yes   There is a phone near the floor for ease of reach in case of a fall No   All O2 tubing is less than 50 ft. and is not a trip hazard N/A   Resident has had an annual hearing and vision check by a physician Yes   Resident has the proper hearing and visual aids prescribed and are in good working order Yes   All medications are properly stored and labeled to avoid confusion on dosage, time to take, and avoidance of missed doses Yes  No future appointments. BP (!) 100/50 (Patient Position: Sitting)   Pulse 78   Resp 16   Wt 169 lb (76.7 kg)   SpO2 97%   BMI 26.47 kg/m  Weight yesterday-169 Last visit weight-169 CBG PTA-79  Came out today for a follow up from [place] visit to finish pill box-pt had to p/u med at pharmacy. meds verified and pill boxes completed. He is on a bi-monthly schedule.  No complaints today. No sob. No pain. No dizziness upon standing. B/p dropped a few points when standing. No changes with  urination. He did take his meds this morning.  He has a very mild end expiratory wheeze to the left lower lobe. No sob. No cough. He states he used to have inhaler but the prescription expired so I advised him to contact him again.    ACTION: Home visit completed Next visit planned for 2 weeks    Marylouise Stacks, EMT-Paramedic  Yevonne Aline, EMT-Paramedic

## 2016-05-07 ENCOUNTER — Other Ambulatory Visit (HOSPITAL_COMMUNITY): Payer: Self-pay

## 2016-05-07 NOTE — Progress Notes (Signed)
Paramedicine Encounter    Patient ID: [name], male    DOB: 08/08/1945, 71 y.o.   MRN: [id number]   Patient Care Team: Wenda Low, MD as PCP - General (Internal Medicine) Lorretta Harp, MD as Consulting Physician (Cardiology)  Patient Active Problem List   Diagnosis Date Noted  . Peripheral arterial disease ([place]) 12/27/2015  . Erectile dysfunction 08/29/2015  . CKD (chronic kidney disease), stage IV ([place]) 01/25/2015  . Diabetes mellitus due to underlying condition with diabetic chronic kidney disease ([place])   . Chronic kidney disease (CKD), stage IV (severe) ([place]) 11/24/2014  . DM (diabetes mellitus), type 2 with renal complications ([place]) 93/81/8299  . Chronic combined systolic and diastolic CHF (congestive heart failure) ([place]) 07/19/2014  . Hypoxia   . CHF exacerbation ([place]) 12/15/2013  . History of carotid artery stenosis 10/05/2013  . Carotid artery disease ([place]) 09/08/2013  . Hyperlipidemia 09/08/2013  . CHF (congestive heart failure) ([place]) 06/02/2012  . Essential hypertension 06/02/2012  . At risk for sudden cardiac death 20-Apr-2012  . Cardiomyopathy, ischemic 04/20/2012  . S/P ICD (internal cardiac defibrillator) procedure, 04-20-2012, Medtronic device ro ICM Apr 20, 2012  . CKD (chronic kidney disease) stage 3, GFR 30-59 ml/min 04/03/2012  . COPD (chronic obstructive pulmonary disease) ([place]) 04/03/2012  . Pulmonary edema  04/01/2012  . Chest pain at rest 04/01/2012  . Diabetes mellitus due to underlying condition ([place]) 11/01/2006  . ABUSE, COCAINE, EPISODIC 11/01/2006  . Polysubstance abuse 11/01/2006  . MYOCARDIAL INFARCTION, HX OF 11/01/2006  . CAD (coronary artery disease) 11/01/2006    Current Outpatient Prescriptions:  .  allopurinol (ZYLOPRIM) 100 MG tablet, Take 300 mg by mouth daily as needed (For gout.). , Disp: , Rfl:  .  amLODipine (NORVASC) 5 MG tablet, TAKE ONE TABLET BY MOUTH IN THE MORNING, Disp: 30 tablet, Rfl: 6 .  aspirin EC 81 MG tablet, Take 81  mg by mouth daily., Disp: , Rfl:  .  atorvastatin (LIPITOR) 40 MG tablet, Take 40 mg by mouth every morning. , Disp: , Rfl:  .  carvedilol (COREG) 12.5 MG tablet, TAKE ONE TABLET BY MOUTH TWICE DAILY, Disp: 60 tablet, Rfl: 3 .  clopidogrel (PLAVIX) 75 MG tablet, Take 1 tablet (75 mg total) by mouth daily., Disp: 90 tablet, Rfl: 3 .  docusate sodium (COLACE) 100 MG capsule, Take 1 capsule (100 mg total) by mouth 2 (two) times daily. Hold for diarrhea. (Patient taking differently: Take 100 mg by mouth daily as needed. Hold for diarrhea.), Disp: 60 capsule, Rfl: 1 .  glimepiride (AMARYL) 1 MG tablet, Take 1 tablet by mouth daily. Reported on 03/10/2015, Disp: , Rfl:  .  hydrALAZINE (APRESOLINE) 100 MG tablet, Take 1 tablet (100 mg total) by mouth 3 (three) times daily., Disp: 90 tablet, Rfl: 5 .  isosorbide mononitrate (IMDUR) 60 MG 24 hr tablet, TAKE ONE & ONE-HALF TABLETS BY MOUTH ONCE DAILY, Disp: 45 tablet, Rfl: 6 .  oxyCODONE-acetaminophen (ROXICET) 5-325 MG tablet, Take 1-2 tablets by mouth every 4 (four) hours as needed for severe pain., Disp: 31 tablet, Rfl: 0 .  potassium chloride SA (K-DUR,KLOR-CON) 20 MEQ tablet, Take 1 tablet (20 mEq total) by mouth daily., Disp: 15 tablet, Rfl: 0 .  torsemide (DEMADEX) 20 MG tablet, TAKE FOUR TABLETS BY MOUTH EVERY OTHER DAY ALTERNATING  WITH  3  TABLETS  EVERY  OTHER  DAY, Disp: 120 tablet, Rfl: 6 .  traZODone (DESYREL) 150 MG tablet, Take 150 mg by mouth at bedtime as needed for  sleep. , Disp: , Rfl:  .  HYDROcodone-acetaminophen (NORCO/VICODIN) 5-325 MG tablet, Take 1 tablet by mouth every 4 (four) hours as needed for moderate pain. (Patient not taking: Reported on 04/26/2016), Disp: 10 tablet, Rfl: 0 No Known Allergies   Social History   Social History  . Marital status: Divorced    Spouse name: N/A  . Number of children: N/A  . Years of education: N/A   Occupational History  . Not on file.   Social History Main Topics  . Smoking status:  Former Smoker    Quit date: 04/18/1982  . Smokeless tobacco: Never Used  . Alcohol use No  . Drug use: No     Comment: stopped three years ago  . Sexual activity: No   Other Topics Concern  . Not on file   Social History Narrative  . No narrative on file    Physical Exam  Constitutional: He is oriented to person, place, and time. He appears well-developed.  Cardiovascular: Normal rate.   Pulmonary/Chest: Breath sounds normal.  Abdominal: Soft.  Musculoskeletal: Normal range of motion.  Neurological: He is oriented to person, place, and time.  Skin: Skin is warm and dry.  Psychiatric: He has a normal mood and affect.        [facility] - 04/23/16 1630      Situation   Admitting diagnosis chf   Heart failure history Exisiting   Comorbidities CKD/renal insufficiency;COPD;DM;HTN   Readmitted within 30 days No   Hospital admission within past 12 months No     Assessment   Lives alone Yes   Primary support person wife/ex   Mode of transportation personal car;public bus;family/friends   Other services involved None   Home equipement Cane;Scale     Weight   Weighs self daily Yes   Scale provided No  has own scale   Records on weight chart Yes     Resources   Has "Living better w/heart failure" book No   Has HF Zone tool No   Able to identify yellow zone signs/when to call MD Yes   Records zone daily No     Medications   Uses a pill box Yes   Who stocks the pill box paramedicine   Pill box checked this visit Yes   Pill box refilled this visit No  has isosorbide for 5 more doses-will come back to refill   Difficulty obtaining medications No   Mail order medications No   Missed one or more doses of medications per week No     Nutrition   Patient receives meals on wheels No   Patient follows low sodium diet Yes   Has foods at home that meet the current recommended diet Yes   Patient follows low sugar/card diet Yes   Nutritional concerns/issues no     Activity Level    ADL's/Mobility Independent   Typical activity level active   Barriers none   Actvity tolerance: NYHA Class 2   How many feet can patient ambulate --  >16ft      Urine   Difficulty urinating No   Changes in urine None     Time spent with patient   Time spent with patient  60 Minutes        No future appointments. BP 126/60 (BP Location: Left Arm)   Pulse 87   Wt 169 lb (76.7 kg)   SpO2 98%   BMI 26.47 kg/m  Weight yesterday-167 Last visit weight- 169 lb CBG-PTA-79  [name] seen at home today and reports feeling well and is in good spitits. He denies SOB, H/A, dizziness or orthopnea. He missed one nighttime dose of Carvedilol but otherwise medications have been taken as ordered. Hydralazine needs refilled for next week-called that in and pt advised he will be able to get it by next Monday.  Amlodipine, isosorbide and potassium needs refilled for week after next.   Jacquiline Doe, EMT-Paramedic Marylouise Stacks, EMT-Paramedic 05/07/16  ACTION: Home visit completed Next visit planned for 1 week

## 2016-05-15 ENCOUNTER — Other Ambulatory Visit (HOSPITAL_COMMUNITY): Payer: Self-pay | Admitting: Adult Health

## 2016-05-16 ENCOUNTER — Other Ambulatory Visit (HOSPITAL_COMMUNITY): Payer: Self-pay

## 2016-05-16 NOTE — Progress Notes (Signed)
Paramedicine Encounter    Patient ID: [name], male    DOB: May 29, 1945, 71 y.o.   MRN: [id number]   Patient Care Team: Wenda Low, MD as PCP - General (Internal Medicine) Lorretta Harp, MD as Consulting Physician (Cardiology)  Patient Active Problem List   Diagnosis Date Noted  . Peripheral arterial disease ([place]) 12/27/2015  . Erectile dysfunction 08/29/2015  . CKD (chronic kidney disease), stage IV ([place]) 01/25/2015  . Diabetes mellitus due to underlying condition with diabetic chronic kidney disease ([place])   . Chronic kidney disease (CKD), stage IV (severe) ([place]) 11/24/2014  . DM (diabetes mellitus), type 2 with renal complications ([place]) 62/95/2841  . Chronic combined systolic and diastolic CHF (congestive heart failure) ([place]) 07/19/2014  . Hypoxia   . CHF exacerbation ([place]) 12/15/2013  . History of carotid artery stenosis 10/05/2013  . Carotid artery disease ([place]) 09/08/2013  . Hyperlipidemia 09/08/2013  . CHF (congestive heart failure) ([place]) 06/02/2012  . Essential hypertension 06/02/2012  . At risk for sudden cardiac death 22-Apr-2012  . Cardiomyopathy, ischemic 04/22/12  . S/P ICD (internal cardiac defibrillator) procedure, Apr 22, 2012, Medtronic device ro ICM Apr 22, 2012  . CKD (chronic kidney disease) stage 3, GFR 30-59 ml/min 04/03/2012  . COPD (chronic obstructive pulmonary disease) ([place]) 04/03/2012  . Pulmonary edema  04/01/2012  . Chest pain at rest 04/01/2012  . Diabetes mellitus due to underlying condition ([place]) 11/01/2006  . ABUSE, COCAINE, EPISODIC 11/01/2006  . Polysubstance abuse 11/01/2006  . MYOCARDIAL INFARCTION, HX OF 11/01/2006  . CAD (coronary artery disease) 11/01/2006    Current Outpatient Prescriptions:  .  allopurinol (ZYLOPRIM) 100 MG tablet, Take 300 mg by mouth daily as needed (For gout.). , Disp: , Rfl:  .  amLODipine (NORVASC) 5 MG tablet, TAKE ONE TABLET BY MOUTH IN THE MORNING, Disp: 90 tablet, Rfl: 3 .  aspirin EC 81 MG tablet, Take 81  mg by mouth daily., Disp: , Rfl:  .  atorvastatin (LIPITOR) 40 MG tablet, Take 40 mg by mouth every morning. , Disp: , Rfl:  .  carvedilol (COREG) 12.5 MG tablet, TAKE ONE TABLET BY MOUTH TWICE DAILY, Disp: 60 tablet, Rfl: 3 .  clopidogrel (PLAVIX) 75 MG tablet, Take 1 tablet (75 mg total) by mouth daily., Disp: 90 tablet, Rfl: 3 .  docusate sodium (COLACE) 100 MG capsule, Take 1 capsule (100 mg total) by mouth 2 (two) times daily. Hold for diarrhea. (Patient taking differently: Take 100 mg by mouth daily as needed. Hold for diarrhea.), Disp: 60 capsule, Rfl: 1 .  glimepiride (AMARYL) 1 MG tablet, Take 1 tablet by mouth daily. Reported on 03/10/2015, Disp: , Rfl:  .  hydrALAZINE (APRESOLINE) 100 MG tablet, Take 1 tablet (100 mg total) by mouth 3 (three) times daily., Disp: 90 tablet, Rfl: 5 .  isosorbide mononitrate (IMDUR) 60 MG 24 hr tablet, TAKE ONE & ONE-HALF TABLETS BY MOUTH ONCE DAILY, Disp: 135 tablet, Rfl: 3 .  oxyCODONE-acetaminophen (ROXICET) 5-325 MG tablet, Take 1-2 tablets by mouth every 4 (four) hours as needed for severe pain., Disp: 31 tablet, Rfl: 0 .  potassium chloride SA (K-DUR,KLOR-CON) 20 MEQ tablet, Take 1 tablet (20 mEq total) by mouth daily., Disp: 15 tablet, Rfl: 0 .  torsemide (DEMADEX) 20 MG tablet, TAKE FOUR TABLETS BY MOUTH EVERY OTHER DAY ALTERNATING  WITH  3  TABLETS  EVERY  OTHER  DAY, Disp: 120 tablet, Rfl: 6 .  traZODone (DESYREL) 150 MG tablet, Take 150 mg by mouth at bedtime as needed for  sleep. , Disp: , Rfl:  .  HYDROcodone-acetaminophen (NORCO/VICODIN) 5-325 MG tablet, Take 1 tablet by mouth every 4 (four) hours as needed for moderate pain. (Patient not taking: Reported on 04/26/2016), Disp: 10 tablet, Rfl: 0 No Known Allergies   Social History   Social History  . Marital status: Divorced    Spouse name: N/A  . Number of children: N/A  . Years of education: N/A   Occupational History  . Not on file.   Social History Main Topics  . Smoking status:  Former Smoker    Quit date: 04/18/1982  . Smokeless tobacco: Never Used  . Alcohol use No  . Drug use: No     Comment: stopped three years ago  . Sexual activity: No   Other Topics Concern  . Not on file   Social History Narrative  . No narrative on file    Physical Exam  Constitutional: He is oriented to person, place, and time. He appears well-developed.  Neck: Normal range of motion.  Cardiovascular: Normal rate.   Pulmonary/Chest: Effort normal and breath sounds normal. No respiratory distress. He has no wheezes.  Abdominal: Soft.  Musculoskeletal: Normal range of motion. He exhibits no edema.  Neurological: He is oriented to person, place, and time.  Skin: Skin is warm and dry.  Psychiatric: He has a normal mood and affect.        [facility] - 04/23/16 1630      Situation   Admitting diagnosis chf   Heart failure history Exisiting   Comorbidities CKD/renal insufficiency;COPD;DM;HTN   Readmitted within 30 days No   Hospital admission within past 12 months No     Assessment   Lives alone Yes   Primary support person wife/ex   Mode of transportation personal car;public bus;family/friends   Other services involved None   Home equipement Cane;Scale     Weight   Weighs self daily Yes   Scale provided No  has own scale   Records on weight chart Yes     Resources   Has "Living better w/heart failure" book No   Has HF Zone tool No   Able to identify yellow zone signs/when to call MD Yes   Records zone daily No     Medications   Uses a pill box Yes   Who stocks the pill box paramedicine   Pill box checked this visit Yes   Pill box refilled this visit No  has isosorbide for 5 more doses-will come back to refill   Difficulty obtaining medications No   Mail order medications No   Missed one or more doses of medications per week No     Nutrition   Patient receives meals on wheels No   Patient follows low sodium diet Yes   Has foods at home that meet the current  recommended diet Yes   Patient follows low sugar/card diet Yes   Nutritional concerns/issues no     Activity Level   ADL's/Mobility Independent   Typical activity level active   Barriers none   Actvity tolerance: NYHA Class 2   How many feet can patient ambulate --  >10ft      Urine   Difficulty urinating No   Changes in urine None     Time spent with patient   Time spent with patient  60 Minutes        No future appointments. BP 108/70   Pulse 72   Resp 16   Wt 162 lb (  73.5 kg)   SpO2 99%   BMI 25.37 kg/m  Weight yesterday-160 Last visit weight-169 CBG-90 PTA    Pt reports he is doing well. He missed one dose of his meds. We went to pharmacy to get his meds for him-they did not include the hydralazine that we called in last week-so we got that called in today and I will go get it for him tomor and come back out to finish out pill box.  He has torsemide in through wed in box #2.  Called in torsemide, colchicine, allopurinol. Will pick up meds tomorrow and stock the rest of his pillbox.   Jacquiline Doe, EMT-Paramedic Marylouise Stacks, EMT-Paramedic  05/16/16  ACTION: Home visit completed Next visit planned for 1 day

## 2016-05-17 ENCOUNTER — Other Ambulatory Visit (HOSPITAL_COMMUNITY): Payer: Self-pay

## 2016-05-17 NOTE — Progress Notes (Signed)
Paramedicine Encounter    Patient ID: [name], male    DOB: [birth year], 71 y.o.   MRN: [id number]   Patient Care Team: Wenda Low, MD as PCP - General (Internal Medicine) Lorretta Harp, MD as Consulting Physician (Cardiology)  Patient Active Problem List   Diagnosis Date Noted  . Peripheral arterial disease ([place]) 12/27/2015  . Erectile dysfunction 08/29/2015  . CKD (chronic kidney disease), stage IV ([place]) 01/25/2015  . Diabetes mellitus due to underlying condition with diabetic chronic kidney disease ([place])   . Chronic kidney disease (CKD), stage IV (severe) ([place]) 11/24/2014  . DM (diabetes mellitus), type 2 with renal complications ([place]) 00/93/8182  . Chronic combined systolic and diastolic CHF (congestive heart failure) ([place]) 07/19/2014  . Hypoxia   . CHF exacerbation ([place]) 12/15/2013  . History of carotid artery stenosis 10/05/2013  . Carotid artery disease ([place]) 09/08/2013  . Hyperlipidemia 09/08/2013  . CHF (congestive heart failure) ([place]) 06/02/2012  . Essential hypertension 06/02/2012  . At risk for sudden cardiac death May 02, 2012  . Cardiomyopathy, ischemic 2012-05-02  . S/P ICD (internal cardiac defibrillator) procedure, May 02, 2012, Medtronic device ro ICM May 02, 2012  . CKD (chronic kidney disease) stage 3, GFR 30-59 ml/min 04/03/2012  . COPD (chronic obstructive pulmonary disease) ([place]) 04/03/2012  . Pulmonary edema  04/01/2012  . Chest pain at rest 04/01/2012  . Diabetes mellitus due to underlying condition ([place]) 11/01/2006  . ABUSE, COCAINE, EPISODIC 11/01/2006  . Polysubstance abuse 11/01/2006  . MYOCARDIAL INFARCTION, HX OF 11/01/2006  . CAD (coronary artery disease) 11/01/2006    Current Outpatient Prescriptions:  .  allopurinol (ZYLOPRIM) 100 MG tablet, Take 300 mg by mouth daily as needed (For gout.). , Disp: , Rfl:  .  amLODipine (NORVASC) 5 MG tablet, TAKE ONE TABLET BY MOUTH IN THE MORNING, Disp: 90 tablet, Rfl: 3 .  aspirin EC 81 MG tablet, Take 81  mg by mouth daily., Disp: , Rfl:  .  atorvastatin (LIPITOR) 40 MG tablet, Take 40 mg by mouth every morning. , Disp: , Rfl:  .  carvedilol (COREG) 12.5 MG tablet, TAKE ONE TABLET BY MOUTH TWICE DAILY, Disp: 60 tablet, Rfl: 3 .  clopidogrel (PLAVIX) 75 MG tablet, Take 1 tablet (75 mg total) by mouth daily., Disp: 90 tablet, Rfl: 3 .  docusate sodium (COLACE) 100 MG capsule, Take 1 capsule (100 mg total) by mouth 2 (two) times daily. Hold for diarrhea. (Patient taking differently: Take 100 mg by mouth daily as needed. Hold for diarrhea.), Disp: 60 capsule, Rfl: 1 .  glimepiride (AMARYL) 1 MG tablet, Take 1 tablet by mouth daily. Reported on 03/10/2015, Disp: , Rfl:  .  hydrALAZINE (APRESOLINE) 100 MG tablet, Take 1 tablet (100 mg total) by mouth 3 (three) times daily., Disp: 90 tablet, Rfl: 5 .  isosorbide mononitrate (IMDUR) 60 MG 24 hr tablet, TAKE ONE & ONE-HALF TABLETS BY MOUTH ONCE DAILY, Disp: 135 tablet, Rfl: 3 .  oxyCODONE-acetaminophen (ROXICET) 5-325 MG tablet, Take 1-2 tablets by mouth every 4 (four) hours as needed for severe pain., Disp: 31 tablet, Rfl: 0 .  potassium chloride SA (K-DUR,KLOR-CON) 20 MEQ tablet, Take 1 tablet (20 mEq total) by mouth daily., Disp: 15 tablet, Rfl: 0 .  torsemide (DEMADEX) 20 MG tablet, TAKE FOUR TABLETS BY MOUTH EVERY OTHER DAY ALTERNATING  WITH  3  TABLETS  EVERY  OTHER  DAY, Disp: 120 tablet, Rfl: 6 .  traZODone (DESYREL) 150 MG tablet, Take 150 mg by mouth at bedtime as needed for  sleep. , Disp: , Rfl:  .  HYDROcodone-acetaminophen (NORCO/VICODIN) 5-325 MG tablet, Take 1 tablet by mouth every 4 (four) hours as needed for moderate pain. (Patient not taking: Reported on 05/17/2016), Disp: 10 tablet, Rfl: 0 No Known Allergies   Social History   Social History  . Marital status: Divorced    Spouse name: N/A  . Number of children: N/A  . Years of education: N/A   Occupational History  . Not on file.   Social History Main Topics  . Smoking status:  Former Smoker    Quit date: 04/18/1982  . Smokeless tobacco: Never Used  . Alcohol use No  . Drug use: No     Comment: stopped three years ago  . Sexual activity: No   Other Topics Concern  . Not on file   Social History Narrative  . No narrative on file    Physical Exam  Constitutional: He is oriented to person, place, and time.  Eyes: Pupils are equal, round, and reactive to light.  Neck: Normal range of motion.  Pulmonary/Chest: Effort normal and breath sounds normal.  Abdominal: Soft.  Musculoskeletal: Normal range of motion. He exhibits no edema.  Neurological: He is oriented to person, place, and time.  Skin: Skin is warm and dry.        SAFE - 04/23/16 1630      Situation   Admitting diagnosis chf   Heart failure history Exisiting   Comorbidities CKD/renal insufficiency;COPD;DM;HTN   Readmitted within 30 days No   Hospital admission within past 12 months No     Assessment   Lives alone Yes   Primary support person wife/ex   Mode of transportation personal car;public bus;family/friends   Other services involved None   Home equipement Cane;Scale     Weight   Weighs self daily Yes   Scale provided No  has own scale   Records on weight chart Yes     Resources   Has "Living better w/heart failure" book No   Has HF Zone tool No   Able to identify yellow zone signs/when to call MD Yes   Records zone daily No     Medications   Uses a pill box Yes   Who stocks the pill box paramedicine   Pill box checked this visit Yes   Pill box refilled this visit No  has isosorbide for 5 more doses-will come back to refill   Difficulty obtaining medications No   Mail order medications No   Missed one or more doses of medications per week No     Nutrition   Patient receives meals on wheels No   Patient follows low sodium diet Yes   Has foods at home that meet the current recommended diet Yes   Patient follows low sugar/card diet Yes   Nutritional concerns/issues no      Activity Level   ADL's/Mobility Independent   Typical activity level active   Barriers none   Actvity tolerance: NYHA Class 2   How many feet can patient ambulate --  >36ft      Urine   Difficulty urinating No   Changes in urine None     Time spent with patient   Time spent with patient  60 Minutes        No future appointments. BP 118/60   Pulse 74   Resp 14   Wt 167 lb (75.8 kg)   SpO2 98%   BMI 26.16 kg/m  Weight yesterday-162  Last visit weight-162 CBG-PTA-79  F/u visit from yesterday for med rec-went to p/u hydralazine and torsemide at pharmacy and brought to him so it can be placed in the rest of the pill box. -placed the hydralazine and torsemide in pill box- his weight is up but he forgot to weigh this morning when he normally does and hes already ate and he reports eating more lately also-no sob.no dizziness. No swelling noted. No s/s.   Marylouise Stacks, EMT-Paramedic 05/17/16    ACTION: Home visit completed Next visit planned for 2 wks

## 2016-05-29 ENCOUNTER — Other Ambulatory Visit (HOSPITAL_COMMUNITY): Payer: Self-pay

## 2016-05-29 NOTE — Progress Notes (Signed)
Paramedicine Encounter    Patient ID: [name], male    DOB: March 18, 1945, 71 y.o.   MRN: [id number]   Patient Care Team: Wenda Low, MD as PCP - General (Internal Medicine) Lorretta Harp, MD as Consulting Physician (Cardiology)  Patient Active Problem List   Diagnosis Date Noted  . Peripheral arterial disease ([place]) 12/27/2015  . Erectile dysfunction 08/29/2015  . CKD (chronic kidney disease), stage IV ([place]) 01/25/2015  . Diabetes mellitus due to underlying condition with diabetic chronic kidney disease ([place])   . Chronic kidney disease (CKD), stage IV (severe) ([place]) 11/24/2014  . DM (diabetes mellitus), type 2 with renal complications ([place]) 40/98/1191  . Chronic combined systolic and diastolic CHF (congestive heart failure) ([place]) 07/19/2014  . Hypoxia   . CHF exacerbation ([place]) 12/15/2013  . History of carotid artery stenosis 10/05/2013  . Carotid artery disease ([place]) 09/08/2013  . Hyperlipidemia 09/08/2013  . CHF (congestive heart failure) ([place]) 06/02/2012  . Essential hypertension 06/02/2012  . At risk for sudden cardiac death 05/07/2012  . Cardiomyopathy, ischemic 05/07/2012  . S/P ICD (internal cardiac defibrillator) procedure, May 07, 2012, Medtronic device ro ICM 07-May-2012  . CKD (chronic kidney disease) stage 3, GFR 30-59 ml/min 04/03/2012  . COPD (chronic obstructive pulmonary disease) ([place]) 04/03/2012  . Pulmonary edema  04/01/2012  . Chest pain at rest 04/01/2012  . Diabetes mellitus due to underlying condition ([place]) 11/01/2006  . ABUSE, COCAINE, EPISODIC 11/01/2006  . Polysubstance abuse 11/01/2006  . MYOCARDIAL INFARCTION, HX OF 11/01/2006  . CAD (coronary artery disease) 11/01/2006    Current Outpatient Prescriptions:  .  allopurinol (ZYLOPRIM) 100 MG tablet, Take 300 mg by mouth daily as needed (For gout.). , Disp: , Rfl:  .  amLODipine (NORVASC) 5 MG tablet, TAKE ONE TABLET BY MOUTH IN THE MORNING, Disp: 90 tablet, Rfl: 3 .  aspirin EC 81 MG tablet, Take 81  mg by mouth daily., Disp: , Rfl:  .  atorvastatin (LIPITOR) 40 MG tablet, Take 40 mg by mouth every morning. , Disp: , Rfl:  .  carvedilol (COREG) 12.5 MG tablet, TAKE ONE TABLET BY MOUTH TWICE DAILY, Disp: 60 tablet, Rfl: 3 .  clopidogrel (PLAVIX) 75 MG tablet, Take 1 tablet (75 mg total) by mouth daily., Disp: 90 tablet, Rfl: 3 .  docusate sodium (COLACE) 100 MG capsule, Take 1 capsule (100 mg total) by mouth 2 (two) times daily. Hold for diarrhea. (Patient taking differently: Take 100 mg by mouth daily as needed. Hold for diarrhea.), Disp: 60 capsule, Rfl: 1 .  glimepiride (AMARYL) 1 MG tablet, Take 1 tablet by mouth daily. Reported on 03/10/2015, Disp: , Rfl:  .  hydrALAZINE (APRESOLINE) 100 MG tablet, Take 1 tablet (100 mg total) by mouth 3 (three) times daily., Disp: 90 tablet, Rfl: 5 .  isosorbide mononitrate (IMDUR) 60 MG 24 hr tablet, TAKE ONE & ONE-HALF TABLETS BY MOUTH ONCE DAILY, Disp: 135 tablet, Rfl: 3 .  oxyCODONE-acetaminophen (ROXICET) 5-325 MG tablet, Take 1-2 tablets by mouth every 4 (four) hours as needed for severe pain., Disp: 31 tablet, Rfl: 0 .  potassium chloride SA (K-DUR,KLOR-CON) 20 MEQ tablet, Take 1 tablet (20 mEq total) by mouth daily., Disp: 15 tablet, Rfl: 0 .  torsemide (DEMADEX) 20 MG tablet, TAKE FOUR TABLETS BY MOUTH EVERY OTHER DAY ALTERNATING  WITH  3  TABLETS  EVERY  OTHER  DAY, Disp: 120 tablet, Rfl: 6 .  traZODone (DESYREL) 150 MG tablet, Take 150 mg by mouth at bedtime as needed for  sleep. , Disp: , Rfl:  .  HYDROcodone-acetaminophen (NORCO/VICODIN) 5-325 MG tablet, Take 1 tablet by mouth every 4 (four) hours as needed for moderate pain. (Patient not taking: Reported on 05/17/2016), Disp: 10 tablet, Rfl: 0 No Known Allergies   Social History   Social History  . Marital status: Divorced    Spouse name: N/A  . Number of children: N/A  . Years of education: N/A   Occupational History  . Not on file.   Social History Main Topics  . Smoking status:  Former Smoker    Quit date: 04/18/1982  . Smokeless tobacco: Never Used  . Alcohol use No  . Drug use: No     Comment: stopped three years ago  . Sexual activity: No   Other Topics Concern  . Not on file   Social History Narrative  . No narrative on file    Physical Exam      SAFE - 04/23/16 1630      Situation   Admitting diagnosis chf   Heart failure history Exisiting   Comorbidities CKD/renal insufficiency;COPD;DM;HTN   Readmitted within 30 days No   Hospital admission within past 12 months No     Assessment   Lives alone Yes   Primary support person wife/ex   Mode of transportation personal car;public bus;family/friends   Other services involved None   Home equipement Cane;Scale     Weight   Weighs self daily Yes   Scale provided No  has own scale   Records on weight chart Yes     Resources   Has "Living better w/heart failure" book No   Has HF Zone tool No   Able to identify yellow zone signs/when to call MD Yes   Records zone daily No     Medications   Uses a pill box Yes   Who stocks the pill box paramedicine   Pill box checked this visit Yes   Pill box refilled this visit No  has isosorbide for 5 more doses-will come back to refill   Difficulty obtaining medications No   Mail order medications No   Missed one or more doses of medications per week No     Nutrition   Patient receives meals on wheels No   Patient follows low sodium diet Yes   Has foods at home that meet the current recommended diet Yes   Patient follows low sugar/card diet Yes   Nutritional concerns/issues no     Activity Level   ADL's/Mobility Independent   Typical activity level active   Barriers none   Actvity tolerance: NYHA Class 2   How many feet can patient ambulate --  >22ft      Urine   Difficulty urinating No   Changes in urine None     Time spent with patient   Time spent with patient  60 Minutes          ReDS Vest - 05/29/16 1300      ReDS Vest   MR   No   Estimated volume prior to reading Low   Fitting Posture Standing   Height Marker Tall   Ruler Value 12.5   Center Strip Aligned   ReDS Value 31   Anatomical Comments hunched over       No future appointments. BP 124/64   Pulse 75   Resp 16   Wt 167 lb (75.8 kg)   SpO2 97%   BMI 26.16 kg/m  Weight yesterday-168 Last visit  weight-167 CBG PTA-91  Pt reports all is well. No sob. No dizziness. No h/a. Taking all meds. He has isosorbide in pill box #1 through sun-tues. Will come back out Thursday to finish pill box.  Was able to get pt fitted for reds vest.  Called in his trazadone and isosorbide and will pick up for him tomor as its calling for more snow and he isnt able to get out during bad weather.     ACTION: Home visit completed Next visit planned for thursday to finish pill box and then 2 wks after that    Marylouise Stacks, EMT-Paramedic 05/29/16

## 2016-06-01 ENCOUNTER — Encounter: Payer: Self-pay | Admitting: Cardiology

## 2016-06-04 ENCOUNTER — Other Ambulatory Visit (HOSPITAL_COMMUNITY): Payer: Self-pay

## 2016-06-04 NOTE — Progress Notes (Signed)
Paramedicine Encounter    Patient ID: [name], male    DOB: Nov 12, 1945, 71 y.o.   MRN: [id number]   Patient Care Team: Wenda Low, MD as PCP - General (Internal Medicine) Lorretta Harp, MD as Consulting Physician (Cardiology)  Patient Active Problem List   Diagnosis Date Noted  . Peripheral arterial disease ([place]) 12/27/2015  . Erectile dysfunction 08/29/2015  . CKD (chronic kidney disease), stage IV ([place]) 01/25/2015  . Diabetes mellitus due to underlying condition with diabetic chronic kidney disease ([place])   . Chronic kidney disease (CKD), stage IV (severe) ([place]) 11/24/2014  . DM (diabetes mellitus), type 2 with renal complications ([place]) 70/17/7939  . Chronic combined systolic and diastolic CHF (congestive heart failure) ([place]) 07/19/2014  . Hypoxia   . CHF exacerbation ([place]) 12/15/2013  . History of carotid artery stenosis 10/05/2013  . Carotid artery disease ([place]) 09/08/2013  . Hyperlipidemia 09/08/2013  . CHF (congestive heart failure) ([place]) 06/02/2012  . Essential hypertension 06/02/2012  . At risk for sudden cardiac death 2012/04/23  . Cardiomyopathy, ischemic 04-23-2012  . S/P ICD (internal cardiac defibrillator) procedure, 04-23-2012, Medtronic device ro ICM 23-Apr-2012  . CKD (chronic kidney disease) stage 3, GFR 30-59 ml/min 04/03/2012  . COPD (chronic obstructive pulmonary disease) ([place]) 04/03/2012  . Pulmonary edema  04/01/2012  . Chest pain at rest 04/01/2012  . Diabetes mellitus due to underlying condition ([place]) 11/01/2006  . ABUSE, COCAINE, EPISODIC 11/01/2006  . Polysubstance abuse 11/01/2006  . MYOCARDIAL INFARCTION, HX OF 11/01/2006  . CAD (coronary artery disease) 11/01/2006    Current Outpatient Prescriptions:  .  allopurinol (ZYLOPRIM) 100 MG tablet, Take 300 mg by mouth daily as needed (For gout.). , Disp: , Rfl:  .  amLODipine (NORVASC) 5 MG tablet, TAKE ONE TABLET BY MOUTH IN THE MORNING, Disp: 90 tablet, Rfl: 3 .  aspirin EC 81 MG tablet, Take 81  mg by mouth daily., Disp: , Rfl:  .  atorvastatin (LIPITOR) 40 MG tablet, Take 40 mg by mouth every morning. , Disp: , Rfl:  .  carvedilol (COREG) 12.5 MG tablet, TAKE ONE TABLET BY MOUTH TWICE DAILY, Disp: 60 tablet, Rfl: 3 .  clopidogrel (PLAVIX) 75 MG tablet, Take 1 tablet (75 mg total) by mouth daily., Disp: 90 tablet, Rfl: 3 .  docusate sodium (COLACE) 100 MG capsule, Take 1 capsule (100 mg total) by mouth 2 (two) times daily. Hold for diarrhea. (Patient taking differently: Take 100 mg by mouth daily as needed. Hold for diarrhea.), Disp: 60 capsule, Rfl: 1 .  glimepiride (AMARYL) 1 MG tablet, Take 1 tablet by mouth daily. Reported on 03/10/2015, Disp: , Rfl:  .  hydrALAZINE (APRESOLINE) 100 MG tablet, Take 1 tablet (100 mg total) by mouth 3 (three) times daily., Disp: 90 tablet, Rfl: 5 .  isosorbide mononitrate (IMDUR) 60 MG 24 hr tablet, TAKE ONE & ONE-HALF TABLETS BY MOUTH ONCE DAILY, Disp: 135 tablet, Rfl: 3 .  oxyCODONE-acetaminophen (ROXICET) 5-325 MG tablet, Take 1-2 tablets by mouth every 4 (four) hours as needed for severe pain., Disp: 31 tablet, Rfl: 0 .  potassium chloride SA (K-DUR,KLOR-CON) 20 MEQ tablet, Take 1 tablet (20 mEq total) by mouth daily., Disp: 15 tablet, Rfl: 0 .  torsemide (DEMADEX) 20 MG tablet, TAKE FOUR TABLETS BY MOUTH EVERY OTHER DAY ALTERNATING  WITH  3  TABLETS  EVERY  OTHER  DAY, Disp: 120 tablet, Rfl: 6 .  traZODone (DESYREL) 150 MG tablet, Take 150 mg by mouth at bedtime as needed for  sleep. , Disp: , Rfl:  .  HYDROcodone-acetaminophen (NORCO/VICODIN) 5-325 MG tablet, Take 1 tablet by mouth every 4 (four) hours as needed for moderate pain. (Patient not taking: Reported on 05/17/2016), Disp: 10 tablet, Rfl: 0 No Known Allergies   Social History   Social History  . Marital status: Divorced    Spouse name: N/A  . Number of children: N/A  . Years of education: N/A   Occupational History  . Not on file.   Social History Main Topics  . Smoking status:  Former Smoker    Quit date: 04/18/1982  . Smokeless tobacco: Never Used  . Alcohol use No  . Drug use: No     Comment: stopped three years ago  . Sexual activity: No   Other Topics Concern  . Not on file   Social History Narrative  . No narrative on file    Physical Exam      SAFE - 04/23/16 1630      Situation   Admitting diagnosis chf   Heart failure history Exisiting   Comorbidities CKD/renal insufficiency;COPD;DM;HTN   Readmitted within 30 days No   Hospital admission within past 12 months No     Assessment   Lives alone Yes   Primary support person wife/ex   Mode of transportation personal car;public bus;family/friends   Other services involved None   Home equipement Cane;Scale     Weight   Weighs self daily Yes   Scale provided No  has own scale   Records on weight chart Yes     Resources   Has "Living better w/heart failure" book No   Has HF Zone tool No   Able to identify yellow zone signs/when to call MD Yes   Records zone daily No     Medications   Uses a pill box Yes   Who stocks the pill box paramedicine   Pill box checked this visit Yes   Pill box refilled this visit No  has isosorbide for 5 more doses-will come back to refill   Difficulty obtaining medications No   Mail order medications No   Missed one or more doses of medications per week No     Nutrition   Patient receives meals on wheels No   Patient follows low sodium diet Yes   Has foods at home that meet the current recommended diet Yes   Patient follows low sugar/card diet Yes   Nutritional concerns/issues no     Activity Level   ADL's/Mobility Independent   Typical activity level active   Barriers none   Actvity tolerance: NYHA Class 2   How many feet can patient ambulate --  >73ft      Urine   Difficulty urinating No   Changes in urine None     Time spent with patient   Time spent with patient  60 Minutes        No future appointments. BP (!) 124/58   Pulse 74    Resp 16   Wt 169 lb (76.7 kg)   SpO2 97%   BMI 26.47 kg/m  Weight yesterday-167 Last visit weight-167 CBG-79 PTA   Pt reports his gout is flaring up since yesterday but other than that he feels good. He took the last of his colchicine this morning-its $24 at pharmacy and he doesn't have that to pay for it. Denies any sob. No dizziness. No h/a. meds verified and pill box refilled. Will need to call in meds prior to  next visit.     ACTION: Home visit completed Next visit planned for 2 wksn    Marylouise Stacks, EMT-Paramedic  06/05/16

## 2016-06-05 ENCOUNTER — Other Ambulatory Visit (HOSPITAL_COMMUNITY): Payer: Self-pay | Admitting: Pharmacist

## 2016-06-11 ENCOUNTER — Other Ambulatory Visit (HOSPITAL_COMMUNITY): Payer: Self-pay | Admitting: Adult Health

## 2016-06-18 ENCOUNTER — Other Ambulatory Visit (HOSPITAL_COMMUNITY): Payer: Self-pay

## 2016-06-18 NOTE — Progress Notes (Signed)
Paramedicine Encounter    Patient ID: [name], male    DOB: Mar 01, 1946, 71 y.o.   MRN: [id number]   Patient Care Team: Wenda Low, MD as PCP - General (Internal Medicine) Lorretta Harp, MD as Consulting Physician (Cardiology)  Patient Active Problem List   Diagnosis Date Noted  . Peripheral arterial disease ([place]) 12/27/2015  . Erectile dysfunction 08/29/2015  . CKD (chronic kidney disease), stage IV ([place]) 01/25/2015  . Diabetes mellitus due to underlying condition with diabetic chronic kidney disease ([place])   . Chronic kidney disease (CKD), stage IV (severe) ([place]) 11/24/2014  . DM (diabetes mellitus), type 2 with renal complications ([place]) 88/41/6606  . Chronic combined systolic and diastolic CHF (congestive heart failure) ([place]) 07/19/2014  . Hypoxia   . CHF exacerbation ([place]) 12/15/2013  . History of carotid artery stenosis 10/05/2013  . Carotid artery disease ([place]) 09/08/2013  . Hyperlipidemia 09/08/2013  . CHF (congestive heart failure) ([place]) 06/02/2012  . Essential hypertension 06/02/2012  . At risk for sudden cardiac death May 10, 2012  . Cardiomyopathy, ischemic 05-10-2012  . S/P ICD (internal cardiac defibrillator) procedure, 2012-05-10, Medtronic device ro ICM May 10, 2012  . CKD (chronic kidney disease) stage 3, GFR 30-59 ml/min 04/03/2012  . COPD (chronic obstructive pulmonary disease) ([place]) 04/03/2012  . Pulmonary edema  04/01/2012  . Chest pain at rest 04/01/2012  . Diabetes mellitus due to underlying condition ([place]) 11/01/2006  . ABUSE, COCAINE, EPISODIC 11/01/2006  . Polysubstance abuse 11/01/2006  . MYOCARDIAL INFARCTION, HX OF 11/01/2006  . CAD (coronary artery disease) 11/01/2006    Current Outpatient Prescriptions:  .  allopurinol (ZYLOPRIM) 100 MG tablet, Take 300 mg by mouth daily as needed (For gout.). , Disp: , Rfl:  .  amLODipine (NORVASC) 5 MG tablet, TAKE ONE TABLET BY MOUTH IN THE MORNING, Disp: 90 tablet, Rfl: 3 .  aspirin EC 81 MG tablet, Take 81  mg by mouth daily., Disp: , Rfl:  .  atorvastatin (LIPITOR) 40 MG tablet, Take 40 mg by mouth every morning. , Disp: , Rfl:  .  carvedilol (COREG) 12.5 MG tablet, TAKE ONE TABLET BY MOUTH TWICE DAILY, Disp: 60 tablet, Rfl: 3 .  clopidogrel (PLAVIX) 75 MG tablet, Take 1 tablet (75 mg total) by mouth daily., Disp: 90 tablet, Rfl: 3 .  docusate sodium (COLACE) 100 MG capsule, Take 1 capsule (100 mg total) by mouth 2 (two) times daily. Hold for diarrhea. (Patient taking differently: Take 100 mg by mouth daily as needed. Hold for diarrhea.), Disp: 60 capsule, Rfl: 1 .  glimepiride (AMARYL) 1 MG tablet, Take 1 tablet by mouth daily. Reported on 03/10/2015, Disp: , Rfl:  .  hydrALAZINE (APRESOLINE) 100 MG tablet, Take 1 tablet (100 mg total) by mouth 3 (three) times daily., Disp: 90 tablet, Rfl: 5 .  isosorbide mononitrate (IMDUR) 60 MG 24 hr tablet, TAKE ONE & ONE-HALF TABLETS BY MOUTH ONCE DAILY, Disp: 135 tablet, Rfl: 3 .  potassium chloride SA (K-DUR,KLOR-CON) 20 MEQ tablet, Take 1 tablet (20 mEq total) by mouth daily., Disp: 15 tablet, Rfl: 0 .  torsemide (DEMADEX) 20 MG tablet, TAKE 4 TABLETS BY MOUTH EVERY OTHER DAY ALTERNATING WITH 3 TABLETS EVERY OTHER DAY., Disp: 120 tablet, Rfl: 6 .  traZODone (DESYREL) 150 MG tablet, Take 150 mg by mouth at bedtime as needed for sleep. , Disp: , Rfl:  .  HYDROcodone-acetaminophen (NORCO/VICODIN) 5-325 MG tablet, Take 1 tablet by mouth every 4 (four) hours as needed for moderate pain. (Patient not taking: Reported on  05/17/2016), Disp: 10 tablet, Rfl: 0 .  oxyCODONE-acetaminophen (ROXICET) 5-325 MG tablet, Take 1-2 tablets by mouth every 4 (four) hours as needed for severe pain. (Patient not taking: Reported on 06/18/2016), Disp: 31 tablet, Rfl: 0 No Known Allergies   Social History   Social History  . Marital status: Divorced    Spouse name: N/A  . Number of children: N/A  . Years of education: N/A   Occupational History  . Not on file.   Social History  Main Topics  . Smoking status: Former Smoker    Quit date: 04/18/1982  . Smokeless tobacco: Never Used  . Alcohol use No  . Drug use: No     Comment: stopped three years ago  . Sexual activity: No   Other Topics Concern  . Not on file   Social History Narrative  . No narrative on file    Physical Exam  Constitutional: He is oriented to person, place, and time.  Neck: Normal range of motion.  Pulmonary/Chest: Effort normal and breath sounds normal. No respiratory distress. He has no wheezes. He has no rales.  Musculoskeletal: He exhibits edema.  Neurological: He is oriented to person, place, and time.  Skin: Skin is warm and dry.  Psychiatric: He has a normal mood and affect.        SAFE - 04/23/16 1630      Situation   Admitting diagnosis chf   Heart failure history Exisiting   Comorbidities CKD/renal insufficiency;COPD;DM;HTN   Readmitted within 30 days No   Hospital admission within past 12 months No     Assessment   Lives alone Yes   Primary support person wife/ex   Mode of transportation personal car;public bus;family/friends   Other services involved None   Home equipement Cane;Scale     Weight   Weighs self daily Yes   Scale provided No  has own scale   Records on weight chart Yes     Resources   Has "Living better w/heart failure" book No   Has HF Zone tool No   Able to identify yellow zone signs/when to call MD Yes   Records zone daily No     Medications   Uses a pill box Yes   Who stocks the pill box paramedicine   Pill box checked this visit Yes   Pill box refilled this visit No  has isosorbide for 5 more doses-will come back to refill   Difficulty obtaining medications No   Mail order medications No   Missed one or more doses of medications per week No     Nutrition   Patient receives meals on wheels No   Patient follows low sodium diet Yes   Has foods at home that meet the current recommended diet Yes   Patient follows low sugar/card  diet Yes   Nutritional concerns/issues no     Activity Level   ADL's/Mobility Independent   Typical activity level active   Barriers none   Actvity tolerance: NYHA Class 2   How many feet can patient ambulate --  >7ft      Urine   Difficulty urinating No   Changes in urine None     Time spent with patient   Time spent with patient  60 Minutes        Future Appointments Date Time Provider [place]  07/04/2016 8:20 AM Sanda Klein, MD [facility]   BP 136/60   Pulse 72   Resp 16  Wt 170 lb (77.1 kg)   SpO2 98%   BMI 26.63 kg/m  Weight yesterday-168 Last visit weight-169 CBG PTA-79  Pt reports he is feeling good. No complaints. No sob. No dizziness, no h/a. No swelling. Pt had an additional pill box and has enough pills so we filled up for 3 weeks worths. He had all meds. He has missed 2 evening doses of meds through out the last 2 wks- no issues at this time. Advised him of any issues to contact me and if not I will see him in 3 wks.  Need to see about his next f/u appointment- per notes it was 2-26mths with ECHO and that was in nov.  Plan is to get him to [place] for free delivery- contacted them last week and they werent able to get it set up due to the insurance [employer] gave them-when he ran it through it was inactive--so I called [place] again and they are going to check on it again.   ACTION: Home visit completed Next visit planned for 3 weeks   Marylouise Stacks, EMT-Paramedic 06/18/16

## 2016-07-04 ENCOUNTER — Encounter: Payer: Self-pay | Admitting: Cardiovascular Disease

## 2016-07-04 ENCOUNTER — Ambulatory Visit (INDEPENDENT_AMBULATORY_CARE_PROVIDER_SITE_OTHER): Payer: Medicare Other | Admitting: Cardiovascular Disease

## 2016-07-04 VITALS — BP 110/50 | HR 60 | Ht 67.0 in | Wt 166.0 lb

## 2016-07-04 DIAGNOSIS — I2510 Atherosclerotic heart disease of native coronary artery without angina pectoris: Secondary | ICD-10-CM

## 2016-07-04 DIAGNOSIS — E785 Hyperlipidemia, unspecified: Secondary | ICD-10-CM | POA: Diagnosis not present

## 2016-07-04 DIAGNOSIS — E0822 Diabetes mellitus due to underlying condition with diabetic chronic kidney disease: Secondary | ICD-10-CM | POA: Diagnosis not present

## 2016-07-04 DIAGNOSIS — E7800 Pure hypercholesterolemia, unspecified: Secondary | ICD-10-CM | POA: Diagnosis not present

## 2016-07-04 DIAGNOSIS — N184 Chronic kidney disease, stage 4 (severe): Secondary | ICD-10-CM | POA: Diagnosis not present

## 2016-07-04 DIAGNOSIS — E1122 Type 2 diabetes mellitus with diabetic chronic kidney disease: Secondary | ICD-10-CM | POA: Diagnosis not present

## 2016-07-04 DIAGNOSIS — I5042 Chronic combined systolic (congestive) and diastolic (congestive) heart failure: Secondary | ICD-10-CM

## 2016-07-04 DIAGNOSIS — I739 Peripheral vascular disease, unspecified: Secondary | ICD-10-CM | POA: Diagnosis not present

## 2016-07-04 DIAGNOSIS — I255 Ischemic cardiomyopathy: Secondary | ICD-10-CM

## 2016-07-04 DIAGNOSIS — Z95810 Presence of automatic (implantable) cardiac defibrillator: Secondary | ICD-10-CM | POA: Diagnosis not present

## 2016-07-04 DIAGNOSIS — Z9189 Other specified personal risk factors, not elsewhere classified: Secondary | ICD-10-CM | POA: Diagnosis not present

## 2016-07-04 DIAGNOSIS — Z79899 Other long term (current) drug therapy: Secondary | ICD-10-CM

## 2016-07-04 LAB — CUP PACEART INCLINIC DEVICE CHECK
Brady Statistic AS VP Percent: 87.73 %
Date Time Interrogation Session: 20180425125200
HIGH POWER IMPEDANCE MEASURED VALUE: 54 Ohm
Implantable Lead Implant Date: 20140207
Implantable Lead Implant Date: 20140207
Implantable Lead Location: 753859
Implantable Pulse Generator Implant Date: 20170105
Lead Channel Impedance Value: 304 Ohm
Lead Channel Impedance Value: 361 Ohm
Lead Channel Impedance Value: 361 Ohm
Lead Channel Impedance Value: 513 Ohm
Lead Channel Impedance Value: 532 Ohm
Lead Channel Impedance Value: 646 Ohm
Lead Channel Pacing Threshold Amplitude: 0.625 V
Lead Channel Pacing Threshold Pulse Width: 0.4 ms
Lead Channel Pacing Threshold Pulse Width: 0.4 ms
Lead Channel Pacing Threshold Pulse Width: 0.4 ms
Lead Channel Setting Pacing Amplitude: 1.5 V
Lead Channel Setting Pacing Amplitude: 2 V
Lead Channel Setting Pacing Pulse Width: 0.4 ms
Lead Channel Setting Sensing Sensitivity: 0.3 mV
MDC IDC LEAD IMPLANT DT: 20170105
MDC IDC LEAD LOCATION: 753858
MDC IDC LEAD LOCATION: 753860
MDC IDC MSMT BATTERY REMAINING LONGEVITY: 97 mo
MDC IDC MSMT BATTERY VOLTAGE: 2.96 V
MDC IDC MSMT LEADCHNL LV IMPEDANCE VALUE: 247 Ohm
MDC IDC MSMT LEADCHNL LV IMPEDANCE VALUE: 304 Ohm
MDC IDC MSMT LEADCHNL LV IMPEDANCE VALUE: 342 Ohm
MDC IDC MSMT LEADCHNL LV IMPEDANCE VALUE: 418 Ohm
MDC IDC MSMT LEADCHNL LV IMPEDANCE VALUE: 551 Ohm
MDC IDC MSMT LEADCHNL LV IMPEDANCE VALUE: 551 Ohm
MDC IDC MSMT LEADCHNL LV PACING THRESHOLD AMPLITUDE: 1.25 V
MDC IDC MSMT LEADCHNL RA IMPEDANCE VALUE: 361 Ohm
MDC IDC MSMT LEADCHNL RA SENSING INTR AMPL: 1.5 mV
MDC IDC MSMT LEADCHNL RA SENSING INTR AMPL: 1.625 mV
MDC IDC MSMT LEADCHNL RV PACING THRESHOLD AMPLITUDE: 0.75 V
MDC IDC MSMT LEADCHNL RV SENSING INTR AMPL: 14.75 mV
MDC IDC MSMT LEADCHNL RV SENSING INTR AMPL: 19 mV
MDC IDC SET LEADCHNL LV PACING AMPLITUDE: 2.25 V
MDC IDC SET LEADCHNL LV PACING PULSEWIDTH: 0.4 ms
MDC IDC STAT BRADY AP VP PERCENT: 10.33 %
MDC IDC STAT BRADY AP VS PERCENT: 0.18 %
MDC IDC STAT BRADY AS VS PERCENT: 1.76 %
MDC IDC STAT BRADY RA PERCENT PACED: 10.44 %
MDC IDC STAT BRADY RV PERCENT PACED: 15.53 %

## 2016-07-04 NOTE — Patient Instructions (Signed)
Dr Sallyanne Kuster recommends that you continue on your current medications as directed. Please refer to the Current Medication list given to you today.  Your physician recommends that you return for lab work at your earliest [place].  Remote monitoring is used to monitor your Pacemaker or ICD from home. This monitoring reduces the number of office visits required to check your device to one time per year. It allows [place] to keep an eye on the functioning of your device to ensure it is working properly. You are scheduled for a device check from home on Wednesday, July 25th, 2018. You may send your transmission at any time that day. If you have a wireless device, the transmission will be sent automatically. After your physician reviews your transmission, you will receive a notification with your next transmission date.  Dr Sallyanne Kuster recommends that you schedule a follow-up appointment in 12 months with a defibrillator check. You will receive a reminder letter in the mail two months in advance. If you don't receive a letter, please call our office to schedule the follow-up appointment.  If you need a refill on your cardiac medications before your next appointment, please call your pharmacy.
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
## 2016-07-10 ENCOUNTER — Telehealth (HOSPITAL_COMMUNITY): Payer: Self-pay | Admitting: Pharmacist

## 2016-07-10 ENCOUNTER — Other Ambulatory Visit (HOSPITAL_COMMUNITY): Payer: Self-pay

## 2016-07-10 NOTE — Telephone Encounter (Signed)
Mr. Elenes had his medications transferred to [facility] so that he could have them delivered to his home. Unfortunately, he has a low income subsidy through his Medicare Part D which requires him to use [facility] or [facility] to get his Rx's at a discounted price so if he uses any other pharmacy, his Rx's are 10x the amount. Christy Sartorius, the pharmacist at [facility], offered to transfer his Rx's back to [facility]. Katie with paramedicine notified.   Ruta Hinds. Velva Harman, PharmD, BCPS, CPP Clinical Pharmacist Pager: [phone] Phone: [phone] 07/10/2016 3:37 PM

## 2016-07-10 NOTE — Progress Notes (Signed)
Paramedicine Encounter    Patient ID: [name], male    DOB: 05/09/1945, 71 y.o.   MRN: [id number]   Patient Care Team: Wenda Low, MD as PCP - General (Internal Medicine) Lorretta Harp, MD as Consulting Physician (Cardiology)  Patient Active Problem List   Diagnosis Date Noted  . Biventricular ICD (implantable cardioverter-defibrillator) in place 07/04/2016  . PAD (peripheral artery disease) ([place]) 12/27/2015  . Erectile dysfunction 08/29/2015  . CKD (chronic kidney disease) stage 4, GFR 15-29 ml/min (HCC) 01/25/2015  . Diabetes mellitus due to underlying condition with diabetic chronic kidney disease ([place])   . Chronic kidney disease (CKD), stage IV (severe) ([place]) 11/24/2014  . DM (diabetes mellitus), type 2 with renal complications ([place]) 19/41/7408  . Chronic combined systolic and diastolic CHF (congestive heart failure) ([place]) 07/19/2014  . Hypoxia   . CHF exacerbation ([place]) 12/15/2013  . History of carotid artery stenosis 10/05/2013  . Carotid artery disease ([place]) 09/08/2013  . Hypercholesterolemia 09/08/2013  . CHF (congestive heart failure) ([place]) 06/02/2012  . Essential hypertension 06/02/2012  . At risk for sudden cardiac death 05/04/2012  . Cardiomyopathy, ischemic 2012-05-04  . S/P ICD (internal cardiac defibrillator) procedure, May 04, 2012, Medtronic device ro ICM May 04, 2012  . CKD (chronic kidney disease) stage 3, GFR 30-59 ml/min 04/03/2012  . COPD (chronic obstructive pulmonary disease) ([place]) 04/03/2012  . Pulmonary edema  04/01/2012  . Chest pain at rest 04/01/2012  . Diabetes mellitus due to underlying condition ([place]) 11/01/2006  . ABUSE, COCAINE, EPISODIC 11/01/2006  . Polysubstance abuse 11/01/2006  . MYOCARDIAL INFARCTION, HX OF 11/01/2006  . CAD (coronary artery disease) 11/01/2006    Current Outpatient Prescriptions:  .  allopurinol (ZYLOPRIM) 100 MG tablet, Take 300 mg by mouth daily as needed (For gout.). , Disp: , Rfl:  .  amLODipine (NORVASC) 5 MG  tablet, TAKE ONE TABLET BY MOUTH IN THE MORNING, Disp: 90 tablet, Rfl: 3 .  aspirin EC 81 MG tablet, Take 81 mg by mouth daily., Disp: , Rfl:  .  atorvastatin (LIPITOR) 40 MG tablet, Take 40 mg by mouth every morning. , Disp: , Rfl:  .  carvedilol (COREG) 12.5 MG tablet, TAKE ONE TABLET BY MOUTH TWICE DAILY, Disp: 60 tablet, Rfl: 3 .  clopidogrel (PLAVIX) 75 MG tablet, Take 1 tablet (75 mg total) by mouth daily., Disp: 90 tablet, Rfl: 3 .  docusate sodium (COLACE) 100 MG capsule, Take 1 capsule (100 mg total) by mouth 2 (two) times daily. Hold for diarrhea. (Patient taking differently: Take 100 mg by mouth daily as needed. Hold for diarrhea.), Disp: 60 capsule, Rfl: 1 .  glimepiride (AMARYL) 1 MG tablet, Take 1 tablet by mouth daily. Reported on 03/10/2015, Disp: , Rfl:  .  hydrALAZINE (APRESOLINE) 100 MG tablet, Take 1 tablet (100 mg total) by mouth 3 (three) times daily., Disp: 90 tablet, Rfl: 5 .  isosorbide mononitrate (IMDUR) 60 MG 24 hr tablet, TAKE ONE & ONE-HALF TABLETS BY MOUTH ONCE DAILY, Disp: 135 tablet, Rfl: 3 .  potassium chloride SA (K-DUR,KLOR-CON) 20 MEQ tablet, Take 1 tablet (20 mEq total) by mouth daily., Disp: 15 tablet, Rfl: 0 .  torsemide (DEMADEX) 20 MG tablet, TAKE 4 TABLETS BY MOUTH EVERY OTHER DAY ALTERNATING WITH 3 TABLETS EVERY OTHER DAY., Disp: 120 tablet, Rfl: 6 .  traZODone (DESYREL) 150 MG tablet, Take 150 mg by mouth at bedtime as needed for sleep. , Disp: , Rfl:  .  HYDROcodone-acetaminophen (NORCO/VICODIN) 5-325 MG tablet, Take 1 tablet by mouth every  4 (four) hours as needed for moderate pain. (Patient not taking: Reported on 05/17/2016), Disp: 10 tablet, Rfl: 0 .  oxyCODONE-acetaminophen (ROXICET) 5-325 MG tablet, Take 1-2 tablets by mouth every 4 (four) hours as needed for severe pain. (Patient not taking: Reported on 06/18/2016), Disp: 31 tablet, Rfl: 0 No Known Allergies   Social History   Social History  . Marital status: Divorced    Spouse name: N/A  .  Number of children: N/A  . Years of education: N/A   Occupational History  . Not on file.   Social History Main Topics  . Smoking status: Former Smoker    Quit date: 04/18/1982  . Smokeless tobacco: Never Used  . Alcohol use No  . Drug use: No     Comment: stopped three years ago  . Sexual activity: No   Other Topics Concern  . Not on file   Social History Narrative  . No narrative on file    Physical Exam      Future Appointments Date Time Provider [place]  07/24/2016 8:00 AM MC ECHO 1-BUZZ [facility]  07/24/2016 9:20 AM MC-HVSC CLINIC MC-HVSC None  10/03/2016 9:50 AM [facility] DEVICE REMOTES [facility]   BP (!) 120/58   Pulse 60   Resp 15   Wt 165 lb (74.8 kg)   SpO2 98%   BMI 25.84 kg/m  Weight yesterday-165 Last visit weight-165 CBG PTA-79  Pt reports everything is going good.  He has glimepiride in through for 5 days to thursday plavix in through 2nd pill box through tues. He states he will get his meds and I will come back out Thursday to finish up pill box.  Weights maintaining stable- He is set up on an approx 3 week pill box as long as pills allow for it- He needs pot, hydra,atorvastatin, plavix, carv, glimepiride refilled. We attempted to move his meds to a pharmacy that delivers however due to his insurance he has to get them from [place] [facility] to get the discount so they were  Moved back over to [place].  Pt denies any issues or concerns. No dizziness. No sob. No h/a.    ACTION: Home visit completed Next visit planned for thursday for med rec   Marylouise Stacks, EMT-Paramedic 07/10/16

## 2016-07-16 ENCOUNTER — Other Ambulatory Visit (HOSPITAL_COMMUNITY): Payer: Self-pay

## 2016-07-16 NOTE — Progress Notes (Signed)
Paramedicine Encounter    Patient ID: [name], male    DOB: 19-May-1945, 71 y.o.   MRN: [id number]    Patient Care Team: Wenda Low, MD as PCP - General (Internal Medicine) Lorretta Harp, MD as Consulting Physician (Cardiology)  Patient Active Problem List   Diagnosis Date Noted  . Biventricular ICD (implantable cardioverter-defibrillator) in place 07/04/2016  . PAD (peripheral artery disease) ([place]) 12/27/2015  . Erectile dysfunction 08/29/2015  . CKD (chronic kidney disease) stage 4, GFR 15-29 ml/min (HCC) 01/25/2015  . Diabetes mellitus due to underlying condition with diabetic chronic kidney disease ([place])   . Chronic kidney disease (CKD), stage IV (severe) ([place]) 11/24/2014  . DM (diabetes mellitus), type 2 with renal complications ([place]) 69/67/8938  . Chronic combined systolic and diastolic CHF (congestive heart failure) ([place]) 07/19/2014  . Hypoxia   . CHF exacerbation ([place]) 12/15/2013  . History of carotid artery stenosis 10/05/2013  . Carotid artery disease ([place]) 09/08/2013  . Hypercholesterolemia 09/08/2013  . CHF (congestive heart failure) ([place]) 06/02/2012  . Essential hypertension 06/02/2012  . At risk for sudden cardiac death Apr 21, 2012  . Cardiomyopathy, ischemic 2012-04-21  . S/P ICD (internal cardiac defibrillator) procedure, 2012/04/21, Medtronic device ro ICM 04-21-12  . CKD (chronic kidney disease) stage 3, GFR 30-59 ml/min 04/03/2012  . COPD (chronic obstructive pulmonary disease) ([place]) 04/03/2012  . Pulmonary edema  04/01/2012  . Chest pain at rest 04/01/2012  . Diabetes mellitus due to underlying condition ([place]) 11/01/2006  . ABUSE, COCAINE, EPISODIC 11/01/2006  . Polysubstance abuse 11/01/2006  . MYOCARDIAL INFARCTION, HX OF 11/01/2006  . CAD (coronary artery disease) 11/01/2006    Current Outpatient Prescriptions:  .  allopurinol (ZYLOPRIM) 100 MG tablet, Take 300 mg by mouth daily as needed (For gout.). , Disp: , Rfl:  .  amLODipine (NORVASC) 5  MG tablet, TAKE ONE TABLET BY MOUTH IN THE MORNING, Disp: 90 tablet, Rfl: 3 .  aspirin EC 81 MG tablet, Take 81 mg by mouth daily., Disp: , Rfl:  .  atorvastatin (LIPITOR) 40 MG tablet, Take 40 mg by mouth every morning. , Disp: , Rfl:  .  carvedilol (COREG) 12.5 MG tablet, TAKE ONE TABLET BY MOUTH TWICE DAILY, Disp: 60 tablet, Rfl: 3 .  clopidogrel (PLAVIX) 75 MG tablet, Take 1 tablet (75 mg total) by mouth daily., Disp: 90 tablet, Rfl: 3 .  glimepiride (AMARYL) 1 MG tablet, Take 1 tablet by mouth daily. Reported on 03/10/2015, Disp: , Rfl:  .  hydrALAZINE (APRESOLINE) 100 MG tablet, Take 1 tablet (100 mg total) by mouth 3 (three) times daily., Disp: 90 tablet, Rfl: 5 .  isosorbide mononitrate (IMDUR) 60 MG 24 hr tablet, TAKE ONE & ONE-HALF TABLETS BY MOUTH ONCE DAILY, Disp: 135 tablet, Rfl: 3 .  potassium chloride SA (K-DUR,KLOR-CON) 20 MEQ tablet, Take 1 tablet (20 mEq total) by mouth daily., Disp: 15 tablet, Rfl: 0 .  torsemide (DEMADEX) 20 MG tablet, TAKE 4 TABLETS BY MOUTH EVERY OTHER DAY ALTERNATING WITH 3 TABLETS EVERY OTHER DAY., Disp: 120 tablet, Rfl: 6 .  traZODone (DESYREL) 150 MG tablet, Take 150 mg by mouth at bedtime as needed for sleep. , Disp: , Rfl:  .  docusate sodium (COLACE) 100 MG capsule, Take 1 capsule (100 mg total) by mouth 2 (two) times daily. Hold for diarrhea. (Patient taking differently: Take 100 mg by mouth daily as needed. Hold for diarrhea.), Disp: 60 capsule, Rfl: 1 .  HYDROcodone-acetaminophen (NORCO/VICODIN) 5-325 MG tablet, Take 1 tablet by mouth  every 4 (four) hours as needed for moderate pain. (Patient not taking: Reported on 05/17/2016), Disp: 10 tablet, Rfl: 0 .  oxyCODONE-acetaminophen (ROXICET) 5-325 MG tablet, Take 1-2 tablets by mouth every 4 (four) hours as needed for severe pain. (Patient not taking: Reported on 06/18/2016), Disp: 31 tablet, Rfl: 0 No Known Allergies   Social History   Social History  . Marital status: Divorced    Spouse name: N/A  .  Number of children: N/A  . Years of education: N/A   Occupational History  . Not on file.   Social History Main Topics  . Smoking status: Former Smoker    Quit date: 04/18/1982  . Smokeless tobacco: Never Used  . Alcohol use No  . Drug use: No     Comment: stopped three years ago  . Sexual activity: No   Other Topics Concern  . Not on file   Social History Narrative  . No narrative on file    Physical Exam  Eyes: Pupils are equal, round, and reactive to light.  Pulmonary/Chest: No respiratory distress. He has no wheezes. He has no rales.  Abdominal: He exhibits no distension. There is no tenderness. There is no guarding.  Musculoskeletal: He exhibits no edema.  Skin: Skin is warm and dry. He is not diaphoretic.        Future Appointments Date Time Provider [place]  07/24/2016 8:00 AM MC ECHO 1-BUZZ MC[facility]ECHOL[facility]  07/24/2016 9:20 AM MC-HVSC CLINIC MC-HVSC None  10/03/2016 9:50 AM [facility] DEVICE REMOTES [facility]   ATF pt CAO x4 standing outside at his mailbox with no complaints. Pt denies dizziess, sob, headache and chest pain. rx bottles verified and pill boxes refilled. Will come out next week for f/u med rec.   **atrovastatin only in box 1  BP 112/64 (BP Location: Left Arm, Patient Position: Sitting, Cuff Size: Normal)   Pulse 67   Resp 16   Wt 165 lb (74.8 kg)   SpO2 98%   BMI 25.84 kg/m   cbg 78 Weight yesterday-165 Last visit weight-165  Marylouise Stacks, EMT Paramedic 07/16/2016    ACTION: Home visit completed

## 2016-07-24 ENCOUNTER — Ambulatory Visit (HOSPITAL_BASED_OUTPATIENT_CLINIC_OR_DEPARTMENT_OTHER)
Admission: RE | Admit: 2016-07-24 | Discharge: 2016-07-24 | Disposition: A | Discharge status: Home / Self Care | Payer: Medicare Other | Source: Ambulatory Visit | Attending: Cardiology | Admitting: Cardiology

## 2016-07-24 ENCOUNTER — Ambulatory Visit (HOSPITAL_COMMUNITY)
Admission: RE | Admit: 2016-07-24 | Discharge: 2016-07-24 | Disposition: A | Discharge status: Home / Self Care | Payer: Medicare Other | Source: Ambulatory Visit | Attending: Internal Medicine | Admitting: Internal Medicine

## 2016-07-24 ENCOUNTER — Encounter (HOSPITAL_COMMUNITY): Payer: Self-pay

## 2016-07-24 ENCOUNTER — Other Ambulatory Visit (HOSPITAL_COMMUNITY): Payer: Self-pay

## 2016-07-24 VITALS — BP 124/59 | HR 59 | Wt 164.2 lb

## 2016-07-24 DIAGNOSIS — I739 Peripheral vascular disease, unspecified: Secondary | ICD-10-CM

## 2016-07-24 DIAGNOSIS — I5042 Chronic combined systolic (congestive) and diastolic (congestive) heart failure: Secondary | ICD-10-CM | POA: Diagnosis not present

## 2016-07-24 DIAGNOSIS — Z87891 Personal history of nicotine dependence: Secondary | ICD-10-CM | POA: Insufficient documentation

## 2016-07-24 DIAGNOSIS — J449 Chronic obstructive pulmonary disease, unspecified: Secondary | ICD-10-CM | POA: Insufficient documentation

## 2016-07-24 DIAGNOSIS — Z7902 Long term (current) use of antithrombotics/antiplatelets: Secondary | ICD-10-CM | POA: Insufficient documentation

## 2016-07-24 DIAGNOSIS — Z7984 Long term (current) use of oral hypoglycemic drugs: Secondary | ICD-10-CM | POA: Insufficient documentation

## 2016-07-24 DIAGNOSIS — Z95810 Presence of automatic (implantable) cardiac defibrillator: Secondary | ICD-10-CM | POA: Diagnosis not present

## 2016-07-24 DIAGNOSIS — I340 Nonrheumatic mitral (valve) insufficiency: Secondary | ICD-10-CM | POA: Diagnosis not present

## 2016-07-24 DIAGNOSIS — I255 Ischemic cardiomyopathy: Secondary | ICD-10-CM | POA: Diagnosis not present

## 2016-07-24 DIAGNOSIS — E785 Hyperlipidemia, unspecified: Secondary | ICD-10-CM | POA: Diagnosis not present

## 2016-07-24 DIAGNOSIS — I130 Hypertensive heart and chronic kidney disease with heart failure and stage 1 through stage 4 chronic kidney disease, or unspecified chronic kidney disease: Secondary | ICD-10-CM | POA: Diagnosis not present

## 2016-07-24 DIAGNOSIS — I2510 Atherosclerotic heart disease of native coronary artery without angina pectoris: Secondary | ICD-10-CM | POA: Diagnosis not present

## 2016-07-24 DIAGNOSIS — Z955 Presence of coronary angioplasty implant and graft: Secondary | ICD-10-CM | POA: Insufficient documentation

## 2016-07-24 DIAGNOSIS — Z833 Family history of diabetes mellitus: Secondary | ICD-10-CM | POA: Diagnosis not present

## 2016-07-24 DIAGNOSIS — N184 Chronic kidney disease, stage 4 (severe): Secondary | ICD-10-CM | POA: Insufficient documentation

## 2016-07-24 DIAGNOSIS — I510 Cardiac septal defect, acquired: Secondary | ICD-10-CM | POA: Insufficient documentation

## 2016-07-24 DIAGNOSIS — Z8249 Family history of ischemic heart disease and other diseases of the circulatory system: Secondary | ICD-10-CM | POA: Diagnosis not present

## 2016-07-24 DIAGNOSIS — E7800 Pure hypercholesterolemia, unspecified: Secondary | ICD-10-CM | POA: Diagnosis not present

## 2016-07-24 DIAGNOSIS — I5022 Chronic systolic (congestive) heart failure: Secondary | ICD-10-CM | POA: Diagnosis not present

## 2016-07-24 DIAGNOSIS — Z951 Presence of aortocoronary bypass graft: Secondary | ICD-10-CM | POA: Insufficient documentation

## 2016-07-24 DIAGNOSIS — E1122 Type 2 diabetes mellitus with diabetic chronic kidney disease: Secondary | ICD-10-CM | POA: Diagnosis not present

## 2016-07-24 DIAGNOSIS — Z809 Family history of malignant neoplasm, unspecified: Secondary | ICD-10-CM | POA: Insufficient documentation

## 2016-07-24 DIAGNOSIS — I6529 Occlusion and stenosis of unspecified carotid artery: Secondary | ICD-10-CM | POA: Diagnosis not present

## 2016-07-24 DIAGNOSIS — Z7982 Long term (current) use of aspirin: Secondary | ICD-10-CM | POA: Diagnosis not present

## 2016-07-24 LAB — BASIC METABOLIC PANEL
Anion gap: 7 (ref 5–15)
BUN: 46 mg/dL — AB (ref 6–20)
CALCIUM: 9.9 mg/dL (ref 8.9–10.3)
CO2: 23 mmol/L (ref 22–32)
CREATININE: 2.32 mg/dL — AB (ref 0.61–1.24)
Chloride: 109 mmol/L (ref 101–111)
GFR, EST AFRICAN AMERICAN: 31 mL/min — AB (ref >60)
GFR, EST NON AFRICAN AMERICAN: 27 mL/min — AB (ref >60)
Glucose, Bld: 127 mg/dL — ABNORMAL HIGH (ref 65–99)
Potassium: 4.4 mmol/L (ref 3.5–5.1)
SODIUM: 139 mmol/L (ref 135–145)

## 2016-07-24 LAB — CBC
HEMATOCRIT: 32.9 % — AB (ref 39.0–52.0)
HEMOGLOBIN: 10.5 g/dL — AB (ref 13.0–17.0)
MCH: 30.1 pg (ref 26.0–34.0)
MCHC: 31.9 g/dL (ref 30.0–36.0)
MCV: 94.3 fL (ref 78.0–100.0)
Platelets: 124 10*3/uL — ABNORMAL LOW (ref 150–400)
RBC: 3.49 MIL/uL — AB (ref 4.22–5.81)
RDW: 13.8 % (ref 11.5–15.5)
WBC: 3.4 10*3/uL — ABNORMAL LOW (ref 4.0–10.5)

## 2016-07-24 LAB — LIPID PANEL
CHOLESTEROL: 98 mg/dL (ref 0–200)
HDL: 51 mg/dL (ref >40)
LDL Cholesterol: 31 mg/dL (ref 0–99)
TRIGLYCERIDES: 80 mg/dL (ref <150)
Total CHOL/HDL Ratio: 1.9 RATIO
VLDL: 16 mg/dL (ref 0–40)

## 2016-07-24 NOTE — Progress Notes (Signed)
Paramedicine Encounter   Patient ID: [name] , male,   DOB: 1945/05/17,70 y.o.,  MRN: [id number]   Met patient in clinic today with provider.  Time spent with patient 45 min   Pt reports he is feeling good. He took his meds this morning prior to coming in.  He reports he has a cyst like bump to the right side of his chest under his skin- I advised him to either see his PCP or dermotologist-minimal pain when touching it, no leaking noted. Weights at home stable. He is due back to clinic in 33mhs. Will continue to see pt to aid in pill box as he has no other resources to help him manage that and he isnt able to do it himself.  Artery check due in august/sept with dr berry. Advised him to call me once he picks up that med that's needed to be placed in pill box.  [place] EMT-Paramedic 07/24/2016   ACTION: Home visit completed

## 2016-07-24 NOTE — Progress Notes (Signed)
  Echocardiogram 2D Echocardiogram has been performed.  [name] 07/24/2016, 8:49 AM

## 2016-07-24 NOTE — Patient Instructions (Signed)
Labs today  We will contact you in 6 months to schedule your next appointment.  

## 2016-07-25 NOTE — Progress Notes (Signed)
Patient ID: [name], male   DOB: [birth year], 71 y.o.   MRN: [id number] PCP: Dr Lysle Rubens Nephrology: Dr. Florene Glen Cardiology: Dr. Sallyanne Kuster HF Cardiology: Dr Aundra Dubin  HPI: [name] is a 71 y.o. with chronic systolic CHF CHF 97-67% s/p Medtronic ICD placement 2/14, CAD s/p CABG x 3, COPD, DM, HTN, LBB, Carotid artery disease s/p stent 8/15 on DAPT, and CKD stage 3-4.   Admitted to [facility] 9/12 through 11/26/14. Diuresed with lasix drip and later transitioned to torsemide 60 mg daily. No ACEI, spironolactone, or digoxin with CKD. Discharge weight was 195 pounds.  Echo in 9/16 with EF 30-35%.  Medtronic CRT-D upgrade in 1/17.   He is generally doing quite well.   Does some walking for exercise.  Despite evidence for PAD from ABIs, he denies claudication-like symptoms.  No significant dyspnea walking on flat ground or up a flight of steps.  No chest pain.  No orthopnea/PND.  No palpitations/lightheadedness.    Optivol: fluid index has trended up but < threshold, thoracic impedance has started to increase.  Very active (>5 hrs/day).   Labs (11/16): K 4.8 => 4.5, creatinine 3.62 => 2.97, BNP 287, LDL 34, HDL 39 Labs (1/17): K 4.9, creatinine 3.19 Labs (11/17): hgb 10.1, BNP 184, K 4.2, creatinine 2.51  ROS: All systems negative except as listed in HPI, PMH and Problem List.  PMH: 1. CAD: s/p CABG with LIMA-LAD, SVG-LCx, SVG-PDA.  Cardiolite 6/15 with no ischemia.  2. Chronic systolic CHF: Ischemic CMP.  Medtronic ICD with CRT upgrade in 1/17.  Echo (9/16) with EF 30-35%, regional WMAs, mild MR, mildly decreased RV systolic function.  - Echo (5/18): EF 55%, basal to mid inferior akinesis, normal RV size and systolic function.  3. COPD 4. LBBB 5. Carotid stenosis: Carotid stent in 8/15.  6. CKD stage IV 7. Type II diabetes 8. HTN 9. Hyperlipidemia 10. PAD: ABIs 9/17 1.1 on right, 0.64 on left.   SH:  Social History   Social History  . Marital status: Single    Spouse name: N/A  . Number  of children: N/A  . Years of education: N/A   Occupational History  . Not on file.   Social History Main Topics  . Smoking status: Former Smoker    Quit date: 04/18/1982  . Smokeless tobacco: Never Used  . Alcohol use No  . Drug use: No     Comment: stopped three years ago  . Sexual activity: No   Other Topics Concern  . Not on file   Social History Narrative  . No narrative on file    FH:  Family History  Problem Relation Age of Onset  . Cancer Mother   . Hypertension Mother   . Cancer Sister   . Diabetes Brother     Current Outpatient Prescriptions  Medication Sig Dispense Refill  . allopurinol (ZYLOPRIM) 100 MG tablet Take 300 mg by mouth daily as needed (For gout.).     Marland Kitchen amLODipine (NORVASC) 5 MG tablet TAKE ONE TABLET BY MOUTH IN THE MORNING 90 tablet 3  . aspirin EC 81 MG tablet Take 81 mg by mouth daily.    Marland Kitchen atorvastatin (LIPITOR) 40 MG tablet Take 40 mg by mouth every morning.     . carvedilol (COREG) 12.5 MG tablet TAKE ONE TABLET BY MOUTH TWICE DAILY 60 tablet 3  . clopidogrel (PLAVIX) 75 MG tablet Take 1 tablet (75 mg total) by mouth daily. 90 tablet 3  . docusate sodium (COLACE) 100  MG capsule Take 1 capsule (100 mg total) by mouth 2 (two) times daily. Hold for diarrhea. 60 capsule 1  . glimepiride (AMARYL) 1 MG tablet Take 1 tablet by mouth daily. Reported on 03/10/2015    . hydrALAZINE (APRESOLINE) 100 MG tablet Take 1 tablet (100 mg total) by mouth 3 (three) times daily. 90 tablet 5  . isosorbide mononitrate (IMDUR) 60 MG 24 hr tablet TAKE ONE & ONE-HALF TABLETS BY MOUTH ONCE DAILY 135 tablet 3  . potassium chloride SA (K-DUR,KLOR-CON) 20 MEQ tablet Take 1 tablet (20 mEq total) by mouth daily. 15 tablet 0  . torsemide (DEMADEX) 20 MG tablet TAKE 4 TABLETS BY MOUTH EVERY OTHER DAY ALTERNATING WITH 3 TABLETS EVERY OTHER DAY. 120 tablet 6  . traZODone (DESYREL) 150 MG tablet Take 150 mg by mouth at bedtime as needed for sleep.     Marland Kitchen HYDROcodone-acetaminophen  (NORCO/VICODIN) 5-325 MG tablet Take 1 tablet by mouth every 4 (four) hours as needed for moderate pain. (Patient not taking: Reported on 05/17/2016) 10 tablet 0  . oxyCODONE-acetaminophen (ROXICET) 5-325 MG tablet Take 1-2 tablets by mouth every 4 (four) hours as needed for severe pain. (Patient not taking: Reported on 06/18/2016) 31 tablet 0   No current facility-administered medications for this encounter.     Vitals:   07/24/16 0856  BP: (!) 124/59  Pulse: (!) 59  SpO2: 99%  Weight: 164 lb 4 oz (74.5 kg)    PHYSICAL EXAM:  General:  Well appearing. No resp difficulty. Ambulated in the clinic without difficulty.  HEENT: normal Neck: supple. JVP 7 cm. Bilateral carotid bruits. No lymphadenopathy or thryomegaly appreciated. Cor: PMI normal. Regular rate & rhythm. No rubs, gallops or murmurs.  Unable to palpate pedal pulses but feet warm.  Lungs: clear Abdomen: soft, nontender, nondistended. No hepatosplenomegaly. No bruits or masses. Good bowel sounds. Extremities: no cyanosis, clubbing, rash.  No edema Neuro: alert & orientedx3, cranial nerves grossly intact. Moves all 4 extremities w/o difficulty. Affect pleasant.  ASSESSMENT & PLAN: 1. Chronic systolic CHF:  Echo 1/61/0960 with LVEF 30-35%, Grade 2 DD, PA peak pressure 53, RV systolic function mildly reduced. Ischemic cardiomyopathy. S/p Medtronic ICD, now upgraded to CRT-D. Echo was done today and reviewed, EF up to 55% with normal-appearing RV (improved s/p CRT).  NYHA class II symptoms, improved. Volume status ok by exam and Optivol.  - Continue current Coreg, hydralazine and Imdur.   - No ACEI or spironolactone with CKD stage IV.  - Continue torsemide 80 mg daily alternating with 60 mg daily.  BMET today.   2. CAD: s/p CABG 2006 with a LIMA to LAD, veins to the circumflex and PDA. Cardiolite performed 09/02/13 showed no ischemia. Denies CP.  - Continue ASA, Plavix, statin.  3. CKD stage 4: Check BMET today. He is followed by  nephrology.   4. Hyperlipidemia: Will get lipids today.   5. Hypertension: BP controlled on current meds. 6. Carotid artery disease: Stent placed 8/15. On ASA and Plavix - Due for carotid dopplers in 8/18.  - Dr. Gwenlyn Found following.  7. PAD: Noted by ABIs in 9/17 but denies claudication and no pedal ulcerations.  Treating medically for now, followed by Dr. Gwenlyn Found.    Followup in 6 months.   Loralie Champagne  07/25/2016

## 2016-07-31 ENCOUNTER — Other Ambulatory Visit (HOSPITAL_COMMUNITY): Payer: Self-pay

## 2016-07-31 NOTE — Progress Notes (Signed)
Paramedicine Encounter    Patient ID: [name], male    DOB: 18-Feb-1946, 71 y.o.   MRN: [id number]   Patient Care Team: Wenda Low, MD as PCP - General (Internal Medicine) Lorretta Harp, MD as Consulting Physician (Cardiology)  Patient Active Problem List   Diagnosis Date Noted  . Biventricular ICD (implantable cardioverter-defibrillator) in place 07/04/2016  . PAD (peripheral artery disease) ([place]) 12/27/2015  . Erectile dysfunction 08/29/2015  . CKD (chronic kidney disease) stage 4, GFR 15-29 ml/min (HCC) 01/25/2015  . Diabetes mellitus due to underlying condition with diabetic chronic kidney disease ([place])   . Chronic kidney disease (CKD), stage IV (severe) ([place]) 11/24/2014  . DM (diabetes mellitus), type 2 with renal complications ([place]) 22/97/9892  . Chronic combined systolic and diastolic CHF (congestive heart failure) ([place]) 07/19/2014  . Hypoxia   . CHF exacerbation ([place]) 12/15/2013  . History of carotid artery stenosis 10/05/2013  . Carotid artery disease ([place]) 09/08/2013  . Hypercholesterolemia 09/08/2013  . CHF (congestive heart failure) ([place]) 06/02/2012  . Essential hypertension 06/02/2012  . At risk for sudden cardiac death May 03, 2012  . Cardiomyopathy, ischemic 05/03/12  . S/P ICD (internal cardiac defibrillator) procedure, 2012-05-03, Medtronic device ro ICM May 03, 2012  . CKD (chronic kidney disease) stage 3, GFR 30-59 ml/min 04/03/2012  . COPD (chronic obstructive pulmonary disease) ([place]) 04/03/2012  . Pulmonary edema  04/01/2012  . Chest pain at rest 04/01/2012  . Diabetes mellitus due to underlying condition ([place]) 11/01/2006  . ABUSE, COCAINE, EPISODIC 11/01/2006  . Polysubstance abuse 11/01/2006  . MYOCARDIAL INFARCTION, HX OF 11/01/2006  . CAD (coronary artery disease) 11/01/2006    Current Outpatient Prescriptions:  .  allopurinol (ZYLOPRIM) 100 MG tablet, Take 300 mg by mouth daily as needed (For gout.). , Disp: , Rfl:  .  amLODipine (NORVASC) 5  MG tablet, TAKE ONE TABLET BY MOUTH IN THE MORNING, Disp: 90 tablet, Rfl: 3 .  aspirin EC 81 MG tablet, Take 81 mg by mouth daily., Disp: , Rfl:  .  atorvastatin (LIPITOR) 40 MG tablet, Take 40 mg by mouth every morning. , Disp: , Rfl:  .  carvedilol (COREG) 12.5 MG tablet, TAKE ONE TABLET BY MOUTH TWICE DAILY, Disp: 60 tablet, Rfl: 3 .  clopidogrel (PLAVIX) 75 MG tablet, Take 1 tablet (75 mg total) by mouth daily., Disp: 90 tablet, Rfl: 3 .  docusate sodium (COLACE) 100 MG capsule, Take 1 capsule (100 mg total) by mouth 2 (two) times daily. Hold for diarrhea., Disp: 60 capsule, Rfl: 1 .  glimepiride (AMARYL) 1 MG tablet, Take 1 tablet by mouth daily. Reported on 03/10/2015, Disp: , Rfl:  .  hydrALAZINE (APRESOLINE) 100 MG tablet, Take 1 tablet (100 mg total) by mouth 3 (three) times daily., Disp: 90 tablet, Rfl: 5 .  isosorbide mononitrate (IMDUR) 60 MG 24 hr tablet, TAKE ONE & ONE-HALF TABLETS BY MOUTH ONCE DAILY, Disp: 135 tablet, Rfl: 3 .  potassium chloride SA (K-DUR,KLOR-CON) 20 MEQ tablet, Take 1 tablet (20 mEq total) by mouth daily., Disp: 15 tablet, Rfl: 0 .  torsemide (DEMADEX) 20 MG tablet, TAKE 4 TABLETS BY MOUTH EVERY OTHER DAY ALTERNATING WITH 3 TABLETS EVERY OTHER DAY., Disp: 120 tablet, Rfl: 6 .  traZODone (DESYREL) 150 MG tablet, Take 150 mg by mouth at bedtime as needed for sleep. , Disp: , Rfl:  .  HYDROcodone-acetaminophen (NORCO/VICODIN) 5-325 MG tablet, Take 1 tablet by mouth every 4 (four) hours as needed for moderate pain. (Patient not taking: Reported on 05/17/2016),  Disp: 10 tablet, Rfl: 0 .  oxyCODONE-acetaminophen (ROXICET) 5-325 MG tablet, Take 1-2 tablets by mouth every 4 (four) hours as needed for severe pain. (Patient not taking: Reported on 06/18/2016), Disp: 31 tablet, Rfl: 0 No Known Allergies   Social History   Social History  . Marital status: Single    Spouse name: N/A  . Number of children: N/A  . Years of education: N/A   Occupational History  . Not on  file.   Social History Main Topics  . Smoking status: Former Smoker    Quit date: 04/18/1982  . Smokeless tobacco: Never Used  . Alcohol use No  . Drug use: No     Comment: stopped three years ago  . Sexual activity: No   Other Topics Concern  . Not on file   Social History Narrative  . No narrative on file    Physical Exam      Future Appointments Date Time Provider [place]  10/03/2016 9:50 AM [facility] DEVICE REMOTES [facility]   BP (!) 114/58   Pulse 74   Resp 16   Wt 165 lb (74.8 kg)   SpO2 98%   BMI 25.84 kg/m  Weight yesterday-164  Last visit weight-165 CBG PTA-102  Pt reports he is doing better today, when I talked to him yesterday he reported he felt down-he said the rain all wknd kept him inside and got his mood down but now doing much better.  He needs glimepiride in box 3 for fri and sat am dose. Need to get that called in and it needs refills. Before next visit will try to get all his other meds refilled if possible. -isosorbide -potassium -hydralazine -glimepiride -clopidogrel -carvedilol  meds verified and pill box refilled. Will see back in a few wks-he is set up 3 pill boxes to be filled as that is the only assistance he needs and does not have anyone else to help with meds.    ACTION: Home visit completed  Marylouise Stacks, EMT-Paramedic 07/31/16

## 2016-08-07 ENCOUNTER — Other Ambulatory Visit: Payer: Self-pay | Admitting: Cardiology

## 2016-08-16 ENCOUNTER — Other Ambulatory Visit: Payer: Self-pay | Admitting: Cardiology

## 2016-08-23 ENCOUNTER — Other Ambulatory Visit (HOSPITAL_COMMUNITY): Payer: Self-pay

## 2016-08-23 NOTE — Progress Notes (Signed)
Paramedicine Encounter    Patient ID: [name], male    DOB: 11/27/1945, 71 y.o.   MRN: [id number]   Patient Care Team: Wenda Low, MD as PCP - General (Internal Medicine) Lorretta Harp, MD as Consulting Physician (Cardiology)  Patient Active Problem List   Diagnosis Date Noted  . Biventricular ICD (implantable cardioverter-defibrillator) in place 07/04/2016  . PAD (peripheral artery disease) ([place]) 12/27/2015  . Erectile dysfunction 08/29/2015  . CKD (chronic kidney disease) stage 4, GFR 15-29 ml/min (HCC) 01/25/2015  . Diabetes mellitus due to underlying condition with diabetic chronic kidney disease ([place])   . Chronic kidney disease (CKD), stage IV (severe) ([place]) 11/24/2014  . DM (diabetes mellitus), type 2 with renal complications ([place]) 32/02/2481  . Chronic combined systolic and diastolic CHF (congestive heart failure) ([place]) 07/19/2014  . Hypoxia   . CHF exacerbation ([place]) 12/15/2013  . History of carotid artery stenosis 10/05/2013  . Carotid artery disease ([place]) 09/08/2013  . Hypercholesterolemia 09/08/2013  . CHF (congestive heart failure) ([place]) 06/02/2012  . Essential hypertension 06/02/2012  . At risk for sudden cardiac death May 05, 2012  . Cardiomyopathy, ischemic 05-05-2012  . S/P ICD (internal cardiac defibrillator) procedure, 05/05/2012, Medtronic device ro ICM 2012-05-05  . CKD (chronic kidney disease) stage 3, GFR 30-59 ml/min 04/03/2012  . COPD (chronic obstructive pulmonary disease) ([place]) 04/03/2012  . Pulmonary edema  04/01/2012  . Chest pain at rest 04/01/2012  . Diabetes mellitus due to underlying condition ([place]) 11/01/2006  . ABUSE, COCAINE, EPISODIC 11/01/2006  . Polysubstance abuse 11/01/2006  . MYOCARDIAL INFARCTION, HX OF 11/01/2006  . CAD (coronary artery disease) 11/01/2006    Current Outpatient Prescriptions:  .  allopurinol (ZYLOPRIM) 100 MG tablet, Take 300 mg by mouth daily as needed (For gout.). , Disp: , Rfl:  .  amLODipine (NORVASC) 5  MG tablet, TAKE ONE TABLET BY MOUTH IN THE MORNING, Disp: 90 tablet, Rfl: 3 .  aspirin EC 81 MG tablet, Take 81 mg by mouth daily., Disp: , Rfl:  .  atorvastatin (LIPITOR) 40 MG tablet, Take 40 mg by mouth every morning. , Disp: , Rfl:  .  carvedilol (COREG) 12.5 MG tablet, TAKE 1 TABLET BY MOUTH TWICE DAILY, Disp: 60 tablet, Rfl: 3 .  clopidogrel (PLAVIX) 75 MG tablet, Take 1 tablet (75 mg total) by mouth daily., Disp: 90 tablet, Rfl: 3 .  docusate sodium (COLACE) 100 MG capsule, Take 1 capsule (100 mg total) by mouth 2 (two) times daily. Hold for diarrhea., Disp: 60 capsule, Rfl: 1 .  glimepiride (AMARYL) 1 MG tablet, Take 1 tablet by mouth daily. Reported on 03/10/2015, Disp: , Rfl:  .  hydrALAZINE (APRESOLINE) 100 MG tablet, Take 1 tablet (100 mg total) by mouth 3 (three) times daily., Disp: 90 tablet, Rfl: 5 .  isosorbide mononitrate (IMDUR) 60 MG 24 hr tablet, TAKE ONE & ONE-HALF TABLETS BY MOUTH ONCE DAILY, Disp: 135 tablet, Rfl: 3 .  potassium chloride SA (K-DUR,KLOR-CON) 20 MEQ tablet, Take 1 tablet (20 mEq total) by mouth daily., Disp: 15 tablet, Rfl: 0 .  torsemide (DEMADEX) 20 MG tablet, TAKE 4 TABLETS BY MOUTH EVERY OTHER DAY ALTERNATING WITH 3 TABLETS EVERY OTHER DAY., Disp: 120 tablet, Rfl: 6 .  traZODone (DESYREL) 150 MG tablet, Take 150 mg by mouth at bedtime as needed for sleep. , Disp: , Rfl:  .  HYDROcodone-acetaminophen (NORCO/VICODIN) 5-325 MG tablet, Take 1 tablet by mouth every 4 (four) hours as needed for moderate pain. (Patient not taking: Reported on 05/17/2016),  Disp: 10 tablet, Rfl: 0 .  oxyCODONE-acetaminophen (ROXICET) 5-325 MG tablet, Take 1-2 tablets by mouth every 4 (four) hours as needed for severe pain. (Patient not taking: Reported on 06/18/2016), Disp: 31 tablet, Rfl: 0 No Known Allergies   Social History   Social History  . Marital status: Single    Spouse name: N/A  . Number of children: N/A  . Years of education: N/A   Occupational History  . Not on  file.   Social History Main Topics  . Smoking status: Former Smoker    Quit date: 04/18/1982  . Smokeless tobacco: Never Used  . Alcohol use No  . Drug use: No     Comment: stopped three years ago  . Sexual activity: No   Other Topics Concern  . Not on file   Social History Narrative  . No narrative on file    Physical Exam      Future Appointments Date Time Provider [place]  10/03/2016 9:50 AM [facility] DEVICE REMOTES [facility]   BP (!) 110/58   Pulse 76   Resp 15   Wt 169 lb (76.7 kg)   SpO2 98%   BMI 26.47 kg/m  Weight yesterday-168 Last visit weight-165 CBG PTA-79  Pt reports he is doing quite well, he just got back from pharmacy and picked up all his meds.  Pt denies any sob, no pains, no dizziness. No swelling noted.  He has everything but the atorvastatin-he has it in 2 pill boxes. meds verified and pill box refilled.  No issues at this time. Will f/u in 2wks and get the atorvastatin called in.   ACTION: Home visit completed  Marylouise Stacks, EMT-Paramedic 08/23/16

## 2016-09-10 ENCOUNTER — Other Ambulatory Visit (HOSPITAL_COMMUNITY): Payer: Self-pay

## 2016-09-10 NOTE — Progress Notes (Signed)
Paramedicine Encounter    Patient ID: [name], male    DOB: 05-11-1945, 71 y.o.   MRN: [id number]   Patient Care Team: Wenda Low, MD as PCP - General (Internal Medicine) Lorretta Harp, MD as Consulting Physician (Cardiology)  Patient Active Problem List   Diagnosis Date Noted  . Biventricular ICD (implantable cardioverter-defibrillator) in place 07/04/2016  . PAD (peripheral artery disease) ([place]) 12/27/2015  . Erectile dysfunction 08/29/2015  . CKD (chronic kidney disease) stage 4, GFR 15-29 ml/min (HCC) 01/25/2015  . Diabetes mellitus due to underlying condition with diabetic chronic kidney disease ([place])   . Chronic kidney disease (CKD), stage IV (severe) ([place]) 11/24/2014  . DM (diabetes mellitus), type 2 with renal complications ([place]) 40/98/1191  . Chronic combined systolic and diastolic CHF (congestive heart failure) ([place]) 07/19/2014  . Hypoxia   . CHF exacerbation ([place]) 12/15/2013  . History of carotid artery stenosis 10/05/2013  . Carotid artery disease ([place]) 09/08/2013  . Hypercholesterolemia 09/08/2013  . CHF (congestive heart failure) ([place]) 06/02/2012  . Essential hypertension 06/02/2012  . At risk for sudden cardiac death May 02, 2012  . Cardiomyopathy, ischemic 05/02/2012  . S/P ICD (internal cardiac defibrillator) procedure, 02-May-2012, Medtronic device ro ICM 05-02-12  . CKD (chronic kidney disease) stage 3, GFR 30-59 ml/min 04/03/2012  . COPD (chronic obstructive pulmonary disease) ([place]) 04/03/2012  . Pulmonary edema  04/01/2012  . Chest pain at rest 04/01/2012  . Diabetes mellitus due to underlying condition ([place]) 11/01/2006  . ABUSE, COCAINE, EPISODIC 11/01/2006  . Polysubstance abuse 11/01/2006  . MYOCARDIAL INFARCTION, HX OF 11/01/2006  . CAD (coronary artery disease) 11/01/2006    Current Outpatient Prescriptions:  .  allopurinol (ZYLOPRIM) 100 MG tablet, Take 300 mg by mouth daily as needed (For gout.). , Disp: , Rfl:  .  amLODipine (NORVASC) 5  MG tablet, TAKE ONE TABLET BY MOUTH IN THE MORNING, Disp: 90 tablet, Rfl: 3 .  aspirin EC 81 MG tablet, Take 81 mg by mouth daily., Disp: , Rfl:  .  carvedilol (COREG) 12.5 MG tablet, TAKE 1 TABLET BY MOUTH TWICE DAILY, Disp: 60 tablet, Rfl: 3 .  clopidogrel (PLAVIX) 75 MG tablet, Take 1 tablet (75 mg total) by mouth daily., Disp: 90 tablet, Rfl: 3 .  glimepiride (AMARYL) 1 MG tablet, Take 1 tablet by mouth daily. Reported on 03/10/2015, Disp: , Rfl:  .  hydrALAZINE (APRESOLINE) 100 MG tablet, Take 1 tablet (100 mg total) by mouth 3 (three) times daily., Disp: 90 tablet, Rfl: 5 .  isosorbide mononitrate (IMDUR) 60 MG 24 hr tablet, TAKE ONE & ONE-HALF TABLETS BY MOUTH ONCE DAILY, Disp: 135 tablet, Rfl: 3 .  potassium chloride SA (K-DUR,KLOR-CON) 20 MEQ tablet, Take 1 tablet (20 mEq total) by mouth daily., Disp: 15 tablet, Rfl: 0 .  torsemide (DEMADEX) 20 MG tablet, TAKE 4 TABLETS BY MOUTH EVERY OTHER DAY ALTERNATING WITH 3 TABLETS EVERY OTHER DAY., Disp: 120 tablet, Rfl: 6 .  traZODone (DESYREL) 150 MG tablet, Take 150 mg by mouth at bedtime as needed for sleep. , Disp: , Rfl:  .  atorvastatin (LIPITOR) 40 MG tablet, Take 40 mg by mouth every morning. , Disp: , Rfl:  .  docusate sodium (COLACE) 100 MG capsule, Take 1 capsule (100 mg total) by mouth 2 (two) times daily. Hold for diarrhea. (Patient not taking: Reported on 09/10/2016), Disp: 60 capsule, Rfl: 1 .  HYDROcodone-acetaminophen (NORCO/VICODIN) 5-325 MG tablet, Take 1 tablet by mouth every 4 (four) hours as needed for moderate pain. (  Patient not taking: Reported on 05/17/2016), Disp: 10 tablet, Rfl: 0 .  oxyCODONE-acetaminophen (ROXICET) 5-325 MG tablet, Take 1-2 tablets by mouth every 4 (four) hours as needed for severe pain. (Patient not taking: Reported on 06/18/2016), Disp: 31 tablet, Rfl: 0 No Known Allergies   Social History   Social History  . Marital status: Single    Spouse name: N/A  . Number of children: N/A  . Years of education:  N/A   Occupational History  . Not on file.   Social History Main Topics  . Smoking status: Former Smoker    Quit date: 04/18/1982  . Smokeless tobacco: Never Used  . Alcohol use No  . Drug use: No     Comment: stopped three years ago  . Sexual activity: No   Other Topics Concern  . Not on file   Social History Narrative  . No narrative on file    Physical Exam      Future Appointments Date Time Provider [place]  10/03/2016 9:50 AM [facility] DEVICE REMOTES [facility]   BP (!) 106/58   Pulse 60   Resp 15   Wt 167 lb (75.8 kg)   BMI 26.16 kg/m  Weight yesterday-165 Last visit weight-169  CBG PTA-96  Pt reports he is doing well, no complaints, he forgot to get his atorvastatin from pharmacy--so he has been without that for about 2wks now--I will have to go get it for him and bring it back-he said last week he was going to get it but forgot--working on alternate plan for his meds--pt denies any sob, no dizziness, he does have arthritic pains to his hand, no recent gout flares. Weight staying stable.no swelling noted.  After his atorvastatin gets filled I will come back out in 2wks.    ACTION: Home visit completed  Marylouise Stacks, EMT-Paramedic 09/10/16

## 2016-09-11 ENCOUNTER — Other Ambulatory Visit (HOSPITAL_COMMUNITY): Payer: Self-pay

## 2016-09-11 NOTE — Progress Notes (Signed)
Came by for a med rec--had to pick up atorvastatin from pharmacy yesterday and came by today to place it in all his pill boxes. [facility] and I gave her patients info per his permission and she is suppose to call me back once she finds out more to see if he is eligible-this will save him trip to pharmacy and make sure he has all his meds since transportation is an issue.   Marylouise Stacks, EMT-Paramedic 09/11/16

## 2016-09-27 ENCOUNTER — Other Ambulatory Visit (HOSPITAL_COMMUNITY): Payer: Self-pay

## 2016-09-27 NOTE — Progress Notes (Signed)
Paramedicine Encounter    Patient ID: [name], male    DOB: 1945-08-29, 71 y.o.   MRN: [id number]   Patient Care Team: Wenda Low, MD as PCP - General (Internal Medicine) Lorretta Harp, MD as Consulting Physician (Cardiology)  Patient Active Problem List   Diagnosis Date Noted  . Biventricular ICD (implantable cardioverter-defibrillator) in place 07/04/2016  . PAD (peripheral artery disease) ([place]) 12/27/2015  . Erectile dysfunction 08/29/2015  . CKD (chronic kidney disease) stage 4, GFR 15-29 ml/min (HCC) 01/25/2015  . Diabetes mellitus due to underlying condition with diabetic chronic kidney disease ([place])   . Chronic kidney disease (CKD), stage IV (severe) ([place]) 11/24/2014  . DM (diabetes mellitus), type 2 with renal complications ([place]) 01/03/8526  . Chronic combined systolic and diastolic CHF (congestive heart failure) ([place]) 07/19/2014  . Hypoxia   . CHF exacerbation ([place]) 12/15/2013  . History of carotid artery stenosis 10/05/2013  . Carotid artery disease ([place]) 09/08/2013  . Hypercholesterolemia 09/08/2013  . CHF (congestive heart failure) ([place]) 06/02/2012  . Essential hypertension 06/02/2012  . At risk for sudden cardiac death May 18, 2012  . Cardiomyopathy, ischemic 2012-05-18  . S/P ICD (internal cardiac defibrillator) procedure, May 18, 2012, Medtronic device ro ICM 05-18-2012  . CKD (chronic kidney disease) stage 3, GFR 30-59 ml/min 04/03/2012  . COPD (chronic obstructive pulmonary disease) ([place]) 04/03/2012  . Pulmonary edema  04/01/2012  . Chest pain at rest 04/01/2012  . Diabetes mellitus due to underlying condition ([place]) 11/01/2006  . ABUSE, COCAINE, EPISODIC 11/01/2006  . Polysubstance abuse 11/01/2006  . MYOCARDIAL INFARCTION, HX OF 11/01/2006  . CAD (coronary artery disease) 11/01/2006    Current Outpatient Prescriptions:  .  allopurinol (ZYLOPRIM) 100 MG tablet, Take 300 mg by mouth daily as needed (For gout.). , Disp: , Rfl:  .  amLODipine (NORVASC) 5  MG tablet, TAKE ONE TABLET BY MOUTH IN THE MORNING, Disp: 90 tablet, Rfl: 3 .  aspirin EC 81 MG tablet, Take 81 mg by mouth daily., Disp: , Rfl:  .  atorvastatin (LIPITOR) 40 MG tablet, Take 40 mg by mouth every morning. , Disp: , Rfl:  .  carvedilol (COREG) 12.5 MG tablet, TAKE 1 TABLET BY MOUTH TWICE DAILY, Disp: 60 tablet, Rfl: 3 .  clopidogrel (PLAVIX) 75 MG tablet, Take 1 tablet (75 mg total) by mouth daily., Disp: 90 tablet, Rfl: 3 .  docusate sodium (COLACE) 100 MG capsule, Take 1 capsule (100 mg total) by mouth 2 (two) times daily. Hold for diarrhea., Disp: 60 capsule, Rfl: 1 .  glimepiride (AMARYL) 1 MG tablet, Take 1 tablet by mouth daily. Reported on 03/10/2015, Disp: , Rfl:  .  hydrALAZINE (APRESOLINE) 100 MG tablet, Take 1 tablet (100 mg total) by mouth 3 (three) times daily., Disp: 90 tablet, Rfl: 5 .  isosorbide mononitrate (IMDUR) 60 MG 24 hr tablet, TAKE ONE & ONE-HALF TABLETS BY MOUTH ONCE DAILY, Disp: 135 tablet, Rfl: 3 .  potassium chloride SA (K-DUR,KLOR-CON) 20 MEQ tablet, Take 1 tablet (20 mEq total) by mouth daily., Disp: 15 tablet, Rfl: 0 .  torsemide (DEMADEX) 20 MG tablet, TAKE 4 TABLETS BY MOUTH EVERY OTHER DAY ALTERNATING WITH 3 TABLETS EVERY OTHER DAY., Disp: 120 tablet, Rfl: 6 .  traZODone (DESYREL) 150 MG tablet, Take 150 mg by mouth at bedtime as needed for sleep. , Disp: , Rfl:  .  HYDROcodone-acetaminophen (NORCO/VICODIN) 5-325 MG tablet, Take 1 tablet by mouth every 4 (four) hours as needed for moderate pain. (Patient not taking: Reported on 09/27/2016),  Disp: 10 tablet, Rfl: 0 .  oxyCODONE-acetaminophen (ROXICET) 5-325 MG tablet, Take 1-2 tablets by mouth every 4 (four) hours as needed for severe pain. (Patient not taking: Reported on 06/18/2016), Disp: 31 tablet, Rfl: 0 No Known Allergies   Social History   Social History  . Marital status: Single    Spouse name: N/A  . Number of children: N/A  . Years of education: N/A   Occupational History  . Not on  file.   Social History Main Topics  . Smoking status: Former Smoker    Quit date: 04/18/1982  . Smokeless tobacco: Never Used  . Alcohol use No  . Drug use: No     Comment: stopped three years ago  . Sexual activity: No   Other Topics Concern  . Not on file   Social History Narrative  . No narrative on file    Physical Exam      Future Appointments Date Time Provider [place]  10/03/2016 9:50 AM [facility] DEVICE REMOTES [facility]   BP (!) 110/54   Pulse 68   Resp 14   Wt 169 lb (76.7 kg)   SpO2 97%   BMI 26.47 kg/m  Weight yesterday-168 Last visit weight-167 CBG PTA-79  Pt reports he is feeling good, did not get the meds from pharmacy-- Called in potassium, hydralazine, glimepiride, carvedilol, atorvastatin, clopidogrel..will come back out Monday to refill box.  [facility] to get pt enrolled but they were going to have to call me back due to the long wait.    ACTION: Home visit completed  Marylouise Stacks, EMT-Paramedic 09/27/16

## 2016-10-01 ENCOUNTER — Telehealth (HOSPITAL_COMMUNITY): Payer: Self-pay

## 2016-10-01 NOTE — Telephone Encounter (Signed)
Per pt request he wanted me to call to get his meds switched over to [facility]. [facility], [facility] day [facility] of his meds to cover him until the delivery comes, that is sch for 8/3 and he is to begin the pill pack dose on 8/6.

## 2016-10-02 ENCOUNTER — Other Ambulatory Visit (HOSPITAL_COMMUNITY): Payer: Self-pay

## 2016-10-02 NOTE — Progress Notes (Signed)
Paramedicine Encounter    Patient ID: [name], male    DOB: 28-Feb-1946, 71 y.o.   MRN: [id number]   Patient Care Team: Wenda Low, MD as PCP - General (Internal Medicine) Lorretta Harp, MD as Consulting Physician (Cardiology)  Patient Active Problem List   Diagnosis Date Noted  . Biventricular ICD (implantable cardioverter-defibrillator) in place 07/04/2016  . PAD (peripheral artery disease) ([place]) 12/27/2015  . Erectile dysfunction 08/29/2015  . CKD (chronic kidney disease) stage 4, GFR 15-29 ml/min (HCC) 01/25/2015  . Diabetes mellitus due to underlying condition with diabetic chronic kidney disease ([place])   . Chronic kidney disease (CKD), stage IV (severe) ([place]) 11/24/2014  . DM (diabetes mellitus), type 2 with renal complications ([place]) 40/98/1191  . Chronic combined systolic and diastolic CHF (congestive heart failure) ([place]) 07/19/2014  . Hypoxia   . CHF exacerbation ([place]) 12/15/2013  . History of carotid artery stenosis 10/05/2013  . Carotid artery disease ([place]) 09/08/2013  . Hypercholesterolemia 09/08/2013  . CHF (congestive heart failure) ([place]) 06/02/2012  . Essential hypertension 06/02/2012  . At risk for sudden cardiac death 30-Apr-2012  . Cardiomyopathy, ischemic 04/30/2012  . S/P ICD (internal cardiac defibrillator) procedure, April 30, 2012, Medtronic device ro ICM April 30, 2012  . CKD (chronic kidney disease) stage 3, GFR 30-59 ml/min 04/03/2012  . COPD (chronic obstructive pulmonary disease) ([place]) 04/03/2012  . Pulmonary edema  04/01/2012  . Chest pain at rest 04/01/2012  . Diabetes mellitus due to underlying condition ([place]) 11/01/2006  . ABUSE, COCAINE, EPISODIC 11/01/2006  . Polysubstance abuse 11/01/2006  . MYOCARDIAL INFARCTION, HX OF 11/01/2006  . CAD (coronary artery disease) 11/01/2006    Current Outpatient Prescriptions:  .  allopurinol (ZYLOPRIM) 100 MG tablet, Take 300 mg by mouth daily as needed (For gout.). , Disp: , Rfl:  .  amLODipine (NORVASC) 5  MG tablet, TAKE ONE TABLET BY MOUTH IN THE MORNING, Disp: 90 tablet, Rfl: 3 .  aspirin EC 81 MG tablet, Take 81 mg by mouth daily., Disp: , Rfl:  .  atorvastatin (LIPITOR) 40 MG tablet, Take 40 mg by mouth every morning. , Disp: , Rfl:  .  carvedilol (COREG) 12.5 MG tablet, TAKE 1 TABLET BY MOUTH TWICE DAILY, Disp: 60 tablet, Rfl: 3 .  clopidogrel (PLAVIX) 75 MG tablet, Take 1 tablet (75 mg total) by mouth daily., Disp: 90 tablet, Rfl: 3 .  glimepiride (AMARYL) 1 MG tablet, Take 1 tablet by mouth daily. Reported on 03/10/2015, Disp: , Rfl:  .  hydrALAZINE (APRESOLINE) 100 MG tablet, Take 1 tablet (100 mg total) by mouth 3 (three) times daily., Disp: 90 tablet, Rfl: 5 .  isosorbide mononitrate (IMDUR) 60 MG 24 hr tablet, TAKE ONE & ONE-HALF TABLETS BY MOUTH ONCE DAILY, Disp: 135 tablet, Rfl: 3 .  potassium chloride SA (K-DUR,KLOR-CON) 20 MEQ tablet, Take 1 tablet (20 mEq total) by mouth daily., Disp: 15 tablet, Rfl: 0 .  torsemide (DEMADEX) 20 MG tablet, TAKE 4 TABLETS BY MOUTH EVERY OTHER DAY ALTERNATING WITH 3 TABLETS EVERY OTHER DAY., Disp: 120 tablet, Rfl: 6 .  traZODone (DESYREL) 150 MG tablet, Take 150 mg by mouth at bedtime as needed for sleep. , Disp: , Rfl:  .  docusate sodium (COLACE) 100 MG capsule, Take 1 capsule (100 mg total) by mouth 2 (two) times daily. Hold for diarrhea. (Patient not taking: Reported on 10/02/2016), Disp: 60 capsule, Rfl: 1 .  HYDROcodone-acetaminophen (NORCO/VICODIN) 5-325 MG tablet, Take 1 tablet by mouth every 4 (four) hours as needed for moderate pain. (  Patient not taking: Reported on 09/27/2016), Disp: 10 tablet, Rfl: 0 .  oxyCODONE-acetaminophen (ROXICET) 5-325 MG tablet, Take 1-2 tablets by mouth every 4 (four) hours as needed for severe pain. (Patient not taking: Reported on 06/18/2016), Disp: 31 tablet, Rfl: 0 No Known Allergies   Social History   Social History  . Marital status: Single    Spouse name: N/A  . Number of children: N/A  . Years of education:  N/A   Occupational History  . Not on file.   Social History Main Topics  . Smoking status: Former Smoker    Quit date: 04/18/1982  . Smokeless tobacco: Never Used  . Alcohol use No  . Drug use: No     Comment: stopped three years ago  . Sexual activity: No   Other Topics Concern  . Not on file   Social History Narrative  . No narrative on file    Physical Exam      Future Appointments Date Time Provider [place]  10/03/2016 9:50 AM [facility] DEVICE REMOTES [facility]   BP 130/64   Pulse 68   Resp 15   Wt 165 lb (74.8 kg)   SpO2 97%   BMI 25.84 kg/m  Weight yesterday-166 Last visit weight-169 CBG PTA-79  Pt reports he is doing well, [facility]-he is good for 2 more weeks with his pill box at home-pt denies any sob. No dizziness. No h/a.  [facility] and it will start on 8/6. Delivered on 8/3.   [facility] [address] [place] [address]  Store # [phone] Fax #[phone]--multi dose packaging pharm Filled first and then transferred to the multi dose    ACTION: Home visit completed  Marylouise Stacks, EMT-Paramedic 10/02/16

## 2016-10-03 ENCOUNTER — Ambulatory Visit (INDEPENDENT_AMBULATORY_CARE_PROVIDER_SITE_OTHER): Payer: Medicare Other | Admitting: *Deleted

## 2016-10-03 DIAGNOSIS — I255 Ischemic cardiomyopathy: Secondary | ICD-10-CM

## 2016-10-03 DIAGNOSIS — I5042 Chronic combined systolic (congestive) and diastolic (congestive) heart failure: Secondary | ICD-10-CM | POA: Diagnosis not present

## 2016-10-03 NOTE — Progress Notes (Signed)
Remote ICD transmission.   

## 2016-10-04 ENCOUNTER — Encounter: Payer: Self-pay | Admitting: Cardiology

## 2016-10-05 ENCOUNTER — Other Ambulatory Visit (HOSPITAL_COMMUNITY): Payer: Self-pay | Admitting: Cardiology

## 2016-10-05 MED ORDER — ASPIRIN EC 81 MG PO TBEC: 81.0000 mg | DELAYED_RELEASE_TABLET | Freq: Every day | ORAL | 3 refills | Status: DC

## 2016-10-05 NOTE — Telephone Encounter (Signed)
New rx needed for "pill pack" program

## 2016-10-22 ENCOUNTER — Other Ambulatory Visit: Payer: Self-pay | Admitting: Cardiovascular Disease

## 2016-10-22 ENCOUNTER — Other Ambulatory Visit (HOSPITAL_COMMUNITY): Payer: Self-pay

## 2016-10-22 DIAGNOSIS — I779 Disorder of arteries and arterioles, unspecified: Secondary | ICD-10-CM

## 2016-10-22 DIAGNOSIS — I739 Peripheral vascular disease, unspecified: Principal | ICD-10-CM

## 2016-10-22 NOTE — Progress Notes (Signed)
Came out to review and show pt how to use the pill packing system however due to the instructions on the torsemide it was sent in a bottle and not in the pill pack-they also have a 4 pill limit in each pack and there was 2 packs for morning and they left out the isosorbide as well. So I think we will just switch him back to [facility] and continue doing what we was doing before as this seems to be very confusing for the pt.

## 2016-10-30 LAB — CUP PACEART REMOTE DEVICE CHECK
Battery Remaining Longevity: 93 mo
Brady Statistic AP VS Percent: 0.55 %
Brady Statistic AS VP Percent: 64.91 %
Brady Statistic AS VS Percent: 1.08 %
Date Time Interrogation Session: 20180725041604
HighPow Impedance: 57 Ohm
Implantable Lead Implant Date: 20140207
Implantable Lead Implant Date: 20140207
Implantable Lead Location: 753858
Implantable Pulse Generator Implant Date: 20170105
Lead Channel Impedance Value: 247 Ohm
Lead Channel Impedance Value: 361 Ohm
Lead Channel Impedance Value: 361 Ohm
Lead Channel Impedance Value: 361 Ohm
Lead Channel Impedance Value: 418 Ohm
Lead Channel Impedance Value: 513 Ohm
Lead Channel Impedance Value: 589 Ohm
Lead Channel Pacing Threshold Amplitude: 0.5 V
Lead Channel Pacing Threshold Amplitude: 1.25 V
Lead Channel Pacing Threshold Pulse Width: 0.4 ms
Lead Channel Pacing Threshold Pulse Width: 0.4 ms
Lead Channel Pacing Threshold Pulse Width: 0.4 ms
Lead Channel Sensing Intrinsic Amplitude: 1.25 mV
Lead Channel Setting Pacing Amplitude: 1.5 V
Lead Channel Setting Pacing Pulse Width: 0.4 ms
Lead Channel Setting Pacing Pulse Width: 0.4 ms
Lead Channel Setting Sensing Sensitivity: 0.3 mV
MDC IDC LEAD IMPLANT DT: 20170105
MDC IDC LEAD LOCATION: 753859
MDC IDC LEAD LOCATION: 753860
MDC IDC MSMT BATTERY VOLTAGE: 2.95 V
MDC IDC MSMT LEADCHNL LV IMPEDANCE VALUE: 304 Ohm
MDC IDC MSMT LEADCHNL LV IMPEDANCE VALUE: 342 Ohm
MDC IDC MSMT LEADCHNL LV IMPEDANCE VALUE: 475 Ohm
MDC IDC MSMT LEADCHNL LV IMPEDANCE VALUE: 532 Ohm
MDC IDC MSMT LEADCHNL LV IMPEDANCE VALUE: 532 Ohm
MDC IDC MSMT LEADCHNL RA SENSING INTR AMPL: 1.25 mV
MDC IDC MSMT LEADCHNL RV IMPEDANCE VALUE: 304 Ohm
MDC IDC MSMT LEADCHNL RV PACING THRESHOLD AMPLITUDE: 0.75 V
MDC IDC MSMT LEADCHNL RV SENSING INTR AMPL: 18.25 mV
MDC IDC MSMT LEADCHNL RV SENSING INTR AMPL: 18.25 mV
MDC IDC SET LEADCHNL LV PACING AMPLITUDE: 2.25 V
MDC IDC SET LEADCHNL RV PACING AMPLITUDE: 2 V
MDC IDC STAT BRADY AP VP PERCENT: 33.47 %
MDC IDC STAT BRADY RA PERCENT PACED: 33.89 %
MDC IDC STAT BRADY RV PERCENT PACED: 36.73 %

## 2016-10-31 ENCOUNTER — Other Ambulatory Visit: Payer: Self-pay | Admitting: Cardiovascular Disease

## 2016-10-31 ENCOUNTER — Ambulatory Visit (HOSPITAL_COMMUNITY)
Admission: RE | Admit: 2016-10-31 | Discharge: 2016-10-31 | Disposition: A | Discharge status: Home / Self Care | Payer: Medicare Other | Source: Ambulatory Visit | Attending: Internal Medicine | Admitting: Internal Medicine

## 2016-10-31 DIAGNOSIS — I779 Disorder of arteries and arterioles, unspecified: Secondary | ICD-10-CM

## 2016-10-31 DIAGNOSIS — I2510 Atherosclerotic heart disease of native coronary artery without angina pectoris: Secondary | ICD-10-CM | POA: Insufficient documentation

## 2016-10-31 DIAGNOSIS — J449 Chronic obstructive pulmonary disease, unspecified: Secondary | ICD-10-CM | POA: Insufficient documentation

## 2016-10-31 DIAGNOSIS — E1151 Type 2 diabetes mellitus with diabetic peripheral angiopathy without gangrene: Secondary | ICD-10-CM | POA: Diagnosis not present

## 2016-10-31 DIAGNOSIS — I10 Essential (primary) hypertension: Secondary | ICD-10-CM | POA: Insufficient documentation

## 2016-10-31 DIAGNOSIS — E785 Hyperlipidemia, unspecified: Secondary | ICD-10-CM | POA: Diagnosis not present

## 2016-10-31 DIAGNOSIS — Z87891 Personal history of nicotine dependence: Secondary | ICD-10-CM | POA: Insufficient documentation

## 2016-10-31 DIAGNOSIS — Z951 Presence of aortocoronary bypass graft: Secondary | ICD-10-CM | POA: Diagnosis not present

## 2016-10-31 DIAGNOSIS — I6523 Occlusion and stenosis of bilateral carotid arteries: Secondary | ICD-10-CM | POA: Insufficient documentation

## 2016-10-31 DIAGNOSIS — I739 Peripheral vascular disease, unspecified: Secondary | ICD-10-CM

## 2016-11-13 ENCOUNTER — Other Ambulatory Visit (HOSPITAL_COMMUNITY): Payer: Self-pay

## 2016-11-13 NOTE — Progress Notes (Signed)
Paramedicine Encounter    Patient ID: [name], male    DOB: 08-23-1945, 71 y.o.   MRN: [id number]   Patient Care Team: Wenda Low, MD as PCP - General (Internal Medicine) Lorretta Harp, MD as Consulting Physician (Cardiology)  Patient Active Problem List   Diagnosis Date Noted  . Biventricular ICD (implantable cardioverter-defibrillator) in place 07/04/2016  . PAD (peripheral artery disease) ([place]) 12/27/2015  . Erectile dysfunction 08/29/2015  . CKD (chronic kidney disease) stage 4, GFR 15-29 ml/min (HCC) 01/25/2015  . Diabetes mellitus due to underlying condition with diabetic chronic kidney disease ([place])   . Chronic kidney disease (CKD), stage IV (severe) ([place]) 11/24/2014  . DM (diabetes mellitus), type 2 with renal complications ([place]) 31/51/7616  . Chronic combined systolic and diastolic CHF (congestive heart failure) ([place]) 07/19/2014  . Hypoxia   . CHF exacerbation ([place]) 12/15/2013  . History of carotid artery stenosis 10/05/2013  . Carotid artery disease ([place]) 09/08/2013  . Hypercholesterolemia 09/08/2013  . CHF (congestive heart failure) ([place]) 06/02/2012  . Essential hypertension 06/02/2012  . At risk for sudden cardiac death 2012/05/01  . Cardiomyopathy, ischemic 2012-05-01  . S/P ICD (internal cardiac defibrillator) procedure, 05/01/12, Medtronic device ro ICM 2012/05/01  . CKD (chronic kidney disease) stage 3, GFR 30-59 ml/min 04/03/2012  . COPD (chronic obstructive pulmonary disease) ([place]) 04/03/2012  . Pulmonary edema  04/01/2012  . Chest pain at rest 04/01/2012  . Diabetes mellitus due to underlying condition ([place]) 11/01/2006  . ABUSE, COCAINE, EPISODIC 11/01/2006  . Polysubstance abuse 11/01/2006  . MYOCARDIAL INFARCTION, HX OF 11/01/2006  . CAD (coronary artery disease) 11/01/2006    Current Outpatient Prescriptions:  .  allopurinol (ZYLOPRIM) 100 MG tablet, Take 300 mg by mouth daily as needed (For gout.). , Disp: , Rfl:  .  amLODipine (NORVASC) 5  MG tablet, TAKE ONE TABLET BY MOUTH IN THE MORNING, Disp: 90 tablet, Rfl: 3 .  aspirin EC 81 MG tablet, Take 1 tablet (81 mg total) by mouth daily., Disp: 90 tablet, Rfl: 3 .  atorvastatin (LIPITOR) 40 MG tablet, Take 40 mg by mouth every morning. , Disp: , Rfl:  .  carvedilol (COREG) 12.5 MG tablet, TAKE 1 TABLET BY MOUTH TWICE DAILY, Disp: 60 tablet, Rfl: 3 .  clopidogrel (PLAVIX) 75 MG tablet, Take 1 tablet (75 mg total) by mouth daily., Disp: 90 tablet, Rfl: 3 .  docusate sodium (COLACE) 100 MG capsule, Take 1 capsule (100 mg total) by mouth 2 (two) times daily. Hold for diarrhea., Disp: 60 capsule, Rfl: 1 .  glimepiride (AMARYL) 1 MG tablet, Take 1 tablet by mouth daily. Reported on 03/10/2015, Disp: , Rfl:  .  hydrALAZINE (APRESOLINE) 100 MG tablet, Take 1 tablet (100 mg total) by mouth 3 (three) times daily., Disp: 90 tablet, Rfl: 5 .  isosorbide mononitrate (IMDUR) 60 MG 24 hr tablet, TAKE ONE & ONE-HALF TABLETS BY MOUTH ONCE DAILY, Disp: 135 tablet, Rfl: 3 .  potassium chloride SA (K-DUR,KLOR-CON) 20 MEQ tablet, Take 1 tablet (20 mEq total) by mouth daily., Disp: 15 tablet, Rfl: 0 .  torsemide (DEMADEX) 20 MG tablet, TAKE 4 TABLETS BY MOUTH EVERY OTHER DAY ALTERNATING WITH 3 TABLETS EVERY OTHER DAY., Disp: 120 tablet, Rfl: 6 .  traZODone (DESYREL) 150 MG tablet, Take 150 mg by mouth at bedtime as needed for sleep. , Disp: , Rfl:  .  HYDROcodone-acetaminophen (NORCO/VICODIN) 5-325 MG tablet, Take 1 tablet by mouth every 4 (four) hours as needed for moderate pain. (Patient not  taking: Reported on 09/27/2016), Disp: 10 tablet, Rfl: 0 .  oxyCODONE-acetaminophen (ROXICET) 5-325 MG tablet, Take 1-2 tablets by mouth every 4 (four) hours as needed for severe pain. (Patient not taking: Reported on 06/18/2016), Disp: 31 tablet, Rfl: 0 No Known Allergies   Social History   Social History  . Marital status: Single    Spouse name: N/A  . Number of children: N/A  . Years of education: N/A    Occupational History  . Not on file.   Social History Main Topics  . Smoking status: Former Smoker    Quit date: 04/18/1982  . Smokeless tobacco: Never Used  . Alcohol use No  . Drug use: No     Comment: stopped three years ago  . Sexual activity: No   Other Topics Concern  . Not on file   Social History Narrative  . No narrative on file    Physical Exam      Future Appointments Date Time Provider [place]  01/02/2017 9:05 AM [facility]OT[facility]   BP 134/60   Pulse 74   Resp 15   Wt 160 lb (72.6 kg)   SpO2 97%   BMI 25.06 kg/m  Weight yesterday-160 Last visit weight-165 CBG PTA-82 CBG EMS-115  Pt reports he is doing well, his wife has noticed the past couple days he has not been able to hold his urine-he has been incontinent.  We [facility] due to the inconvenience of them, they moved them to bottles but did not fill the glimepiride so that was called in and will p/u tomor.  He also needs atorvastatin, plavix and hydralazine. [facility] and [facility] and they advised it was transferred over and it had refills left on it. Pt denies any sob. Will come back out tomor.   ACTION: Home visit completed  Marylouise Stacks, EMT-Paramedic 11/13/16

## 2016-11-14 ENCOUNTER — Other Ambulatory Visit (HOSPITAL_COMMUNITY): Payer: Self-pay

## 2016-11-14 ENCOUNTER — Telehealth (HOSPITAL_COMMUNITY): Payer: Self-pay | Admitting: Pharmacist

## 2016-11-14 MED ORDER — ATORVASTATIN CALCIUM 40 MG PO TABS: 40.0000 mg | ORAL_TABLET | ORAL | 5 refills | Status: DC

## 2016-11-14 NOTE — Progress Notes (Signed)
Paramedicine Encounter    Patient ID: [name], male    DOB: 11/19/1945, 71 y.o.   MRN: [id number]   Patient Care Team: Wenda Low, MD as PCP - General (Internal Medicine) Lorretta Harp, MD as Consulting Physician (Cardiology)  Patient Active Problem List   Diagnosis Date Noted  . Biventricular ICD (implantable cardioverter-defibrillator) in place 07/04/2016  . PAD (peripheral artery disease) ([place]) 12/27/2015  . Erectile dysfunction 08/29/2015  . CKD (chronic kidney disease) stage 4, GFR 15-29 ml/min (HCC) 01/25/2015  . Diabetes mellitus due to underlying condition with diabetic chronic kidney disease ([place])   . Chronic kidney disease (CKD), stage IV (severe) ([place]) 11/24/2014  . DM (diabetes mellitus), type 2 with renal complications ([place]) 75/91/6384  . Chronic combined systolic and diastolic CHF (congestive heart failure) ([place]) 07/19/2014  . Hypoxia   . CHF exacerbation ([place]) 12/15/2013  . History of carotid artery stenosis 10/05/2013  . Carotid artery disease ([place]) 09/08/2013  . Hypercholesterolemia 09/08/2013  . CHF (congestive heart failure) ([place]) 06/02/2012  . Essential hypertension 06/02/2012  . At risk for sudden cardiac death 04/22/2012  . Cardiomyopathy, ischemic 04-22-12  . S/P ICD (internal cardiac defibrillator) procedure, 04/22/2012, Medtronic device ro ICM 04-22-12  . CKD (chronic kidney disease) stage 3, GFR 30-59 ml/min 04/03/2012  . COPD (chronic obstructive pulmonary disease) ([place]) 04/03/2012  . Pulmonary edema  04/01/2012  . Chest pain at rest 04/01/2012  . Diabetes mellitus due to underlying condition ([place]) 11/01/2006  . ABUSE, COCAINE, EPISODIC 11/01/2006  . Polysubstance abuse 11/01/2006  . MYOCARDIAL INFARCTION, HX OF 11/01/2006  . CAD (coronary artery disease) 11/01/2006    Current Outpatient Prescriptions:  .  allopurinol (ZYLOPRIM) 100 MG tablet, Take 300 mg by mouth daily as needed (For gout.). , Disp: , Rfl:  .  amLODipine (NORVASC) 5  MG tablet, TAKE ONE TABLET BY MOUTH IN THE MORNING, Disp: 90 tablet, Rfl: 3 .  aspirin EC 81 MG tablet, Take 1 tablet (81 mg total) by mouth daily., Disp: 90 tablet, Rfl: 3 .  atorvastatin (LIPITOR) 40 MG tablet, Take 1 tablet (40 mg total) by mouth every morning., Disp: 30 tablet, Rfl: 5 .  carvedilol (COREG) 12.5 MG tablet, TAKE 1 TABLET BY MOUTH TWICE DAILY, Disp: 60 tablet, Rfl: 3 .  clopidogrel (PLAVIX) 75 MG tablet, Take 1 tablet (75 mg total) by mouth daily., Disp: 90 tablet, Rfl: 3 .  glimepiride (AMARYL) 1 MG tablet, Take 1 tablet by mouth daily. Reported on 03/10/2015, Disp: , Rfl:  .  hydrALAZINE (APRESOLINE) 100 MG tablet, Take 1 tablet (100 mg total) by mouth 3 (three) times daily., Disp: 90 tablet, Rfl: 5 .  isosorbide mononitrate (IMDUR) 60 MG 24 hr tablet, TAKE ONE & ONE-HALF TABLETS BY MOUTH ONCE DAILY, Disp: 135 tablet, Rfl: 3 .  potassium chloride SA (K-DUR,KLOR-CON) 20 MEQ tablet, Take 1 tablet (20 mEq total) by mouth daily., Disp: 15 tablet, Rfl: 0 .  torsemide (DEMADEX) 20 MG tablet, TAKE 4 TABLETS BY MOUTH EVERY OTHER DAY ALTERNATING WITH 3 TABLETS EVERY OTHER DAY., Disp: 120 tablet, Rfl: 6 .  traZODone (DESYREL) 150 MG tablet, Take 150 mg by mouth at bedtime as needed for sleep. , Disp: , Rfl:  .  docusate sodium (COLACE) 100 MG capsule, Take 1 capsule (100 mg total) by mouth 2 (two) times daily. Hold for diarrhea. (Patient not taking: Reported on 11/14/2016), Disp: 60 capsule, Rfl: 1 .  HYDROcodone-acetaminophen (NORCO/VICODIN) 5-325 MG tablet, Take 1 tablet by mouth every 4 (  four) hours as needed for moderate pain. (Patient not taking: Reported on 09/27/2016), Disp: 10 tablet, Rfl: 0 .  oxyCODONE-acetaminophen (ROXICET) 5-325 MG tablet, Take 1-2 tablets by mouth every 4 (four) hours as needed for severe pain. (Patient not taking: Reported on 06/18/2016), Disp: 31 tablet, Rfl: 0 No Known Allergies   Social History   Social History  . Marital status: Single    Spouse name: N/A   . Number of children: N/A  . Years of education: N/A   Occupational History  . Not on file.   Social History Main Topics  . Smoking status: Former Smoker    Quit date: 04/18/1982  . Smokeless tobacco: Never Used  . Alcohol use No  . Drug use: No     Comment: stopped three years ago  . Sexual activity: No   Other Topics Concern  . Not on file   Social History Narrative  . No narrative on file    Physical Exam      Future Appointments Date Time Provider [place]  01/02/2017 9:05 AM [facility]ICE [facility]OT[facility]   BP 138/62   Pulse 86   Resp 15   Wt 159 lb (72.1 kg)   SpO2 98%   BMI 24.90 kg/m  Weight yesterday-160 Last visit weight-160 CBG PTA-84  Came out today for med rec and finish pill box, [facility].  Pharmacy only filled 14 pills for the glimepiride and the plavix- the pharmacy will fill the other 14 tabs and I will get them and bring them to him. No complaints from pt. No sob.  Will come back in 2 wks. Pt did just take his med when I arrived.    ACTION: Home visit completed  Marylouise Stacks, EMT-Paramedic 11/14/16

## 2016-11-14 NOTE — Telephone Encounter (Signed)
Refill atorvastatin

## 2016-11-28 ENCOUNTER — Other Ambulatory Visit (HOSPITAL_COMMUNITY): Payer: Self-pay

## 2016-11-28 NOTE — Progress Notes (Signed)
Paramedicine Encounter    Patient ID: [name], male    DOB: 20-Oct-1945, 71 y.o.   MRN: [id number]   Patient Care Team: Wenda Low, MD as PCP - General (Internal Medicine) Lorretta Harp, MD as Consulting Physician (Cardiology)  Patient Active Problem List   Diagnosis Date Noted  . Biventricular ICD (implantable cardioverter-defibrillator) in place 07/04/2016  . PAD (peripheral artery disease) ([place]) 12/27/2015  . Erectile dysfunction 08/29/2015  . CKD (chronic kidney disease) stage 4, GFR 15-29 ml/min (HCC) 01/25/2015  . Diabetes mellitus due to underlying condition with diabetic chronic kidney disease ([place])   . Chronic kidney disease (CKD), stage IV (severe) ([place]) 11/24/2014  . DM (diabetes mellitus), type 2 with renal complications ([place]) 46/96/2952  . Chronic combined systolic and diastolic CHF (congestive heart failure) ([place]) 07/19/2014  . Hypoxia   . CHF exacerbation ([place]) 12/15/2013  . History of carotid artery stenosis 10/05/2013  . Carotid artery disease ([place]) 09/08/2013  . Hypercholesterolemia 09/08/2013  . CHF (congestive heart failure) ([place]) 06/02/2012  . Essential hypertension 06/02/2012  . At risk for sudden cardiac death 05/10/12  . Cardiomyopathy, ischemic May 10, 2012  . S/P ICD (internal cardiac defibrillator) procedure, May 10, 2012, Medtronic device ro ICM 05-10-12  . CKD (chronic kidney disease) stage 3, GFR 30-59 ml/min 04/03/2012  . COPD (chronic obstructive pulmonary disease) ([place]) 04/03/2012  . Pulmonary edema  04/01/2012  . Chest pain at rest 04/01/2012  . Diabetes mellitus due to underlying condition ([place]) 11/01/2006  . ABUSE, COCAINE, EPISODIC 11/01/2006  . Polysubstance abuse 11/01/2006  . MYOCARDIAL INFARCTION, HX OF 11/01/2006  . CAD (coronary artery disease) 11/01/2006    Current Outpatient Prescriptions:  .  allopurinol (ZYLOPRIM) 100 MG tablet, Take 300 mg by mouth daily as needed (For gout.). , Disp: , Rfl:  .  amLODipine (NORVASC) 5  MG tablet, TAKE ONE TABLET BY MOUTH IN THE MORNING, Disp: 90 tablet, Rfl: 3 .  aspirin EC 81 MG tablet, Take 1 tablet (81 mg total) by mouth daily., Disp: 90 tablet, Rfl: 3 .  atorvastatin (LIPITOR) 40 MG tablet, Take 1 tablet (40 mg total) by mouth every morning., Disp: 30 tablet, Rfl: 5 .  carvedilol (COREG) 12.5 MG tablet, TAKE 1 TABLET BY MOUTH TWICE DAILY, Disp: 60 tablet, Rfl: 3 .  clopidogrel (PLAVIX) 75 MG tablet, Take 1 tablet (75 mg total) by mouth daily., Disp: 90 tablet, Rfl: 3 .  docusate sodium (COLACE) 100 MG capsule, Take 1 capsule (100 mg total) by mouth 2 (two) times daily. Hold for diarrhea. (Patient not taking: Reported on 11/14/2016), Disp: 60 capsule, Rfl: 1 .  glimepiride (AMARYL) 1 MG tablet, Take 1 tablet by mouth daily. Reported on 03/10/2015, Disp: , Rfl:  .  hydrALAZINE (APRESOLINE) 100 MG tablet, Take 1 tablet (100 mg total) by mouth 3 (three) times daily., Disp: 90 tablet, Rfl: 5 .  HYDROcodone-acetaminophen (NORCO/VICODIN) 5-325 MG tablet, Take 1 tablet by mouth every 4 (four) hours as needed for moderate pain. (Patient not taking: Reported on 09/27/2016), Disp: 10 tablet, Rfl: 0 .  isosorbide mononitrate (IMDUR) 60 MG 24 hr tablet, TAKE ONE & ONE-HALF TABLETS BY MOUTH ONCE DAILY, Disp: 135 tablet, Rfl: 3 .  oxyCODONE-acetaminophen (ROXICET) 5-325 MG tablet, Take 1-2 tablets by mouth every 4 (four) hours as needed for severe pain. (Patient not taking: Reported on 06/18/2016), Disp: 31 tablet, Rfl: 0 .  potassium chloride SA (K-DUR,KLOR-CON) 20 MEQ tablet, Take 1 tablet (20 mEq total) by mouth daily., Disp: 15 tablet, Rfl: 0 .  torsemide (DEMADEX) 20 MG tablet, TAKE 4 TABLETS BY MOUTH EVERY OTHER DAY ALTERNATING WITH 3 TABLETS EVERY OTHER DAY., Disp: 120 tablet, Rfl: 6 .  traZODone (DESYREL) 150 MG tablet, Take 150 mg by mouth at bedtime as needed for sleep. , Disp: , Rfl:  No Known Allergies   Social History   Social History  . Marital status: Single    Spouse name: N/A   . Number of children: N/A  . Years of education: N/A   Occupational History  . Not on file.   Social History Main Topics  . Smoking status: Former Smoker    Quit date: 04/18/1982  . Smokeless tobacco: Never Used  . Alcohol use No  . Drug use: No     Comment: stopped three years ago  . Sexual activity: No   Other Topics Concern  . Not on file   Social History Narrative  . No narrative on file    Physical Exam      Future Appointments Date Time Provider [place]  01/02/2017 9:05 AM [facility]ICE [facility]OT[facility]   BP 114/64 Comment: 114  Pulse 64   Resp 15   Wt 167 lb (75.8 kg)   SpO2 98%   BMI 26.16 kg/m  Weight yesterday-??? Last visit weight-160  Pt reports he is doing very well, he has been staying with his wife in [place], sept 5 he picked up glimeperide and plavix but he lost the bottles so I called the pharmacy to see what can be done, so he is going to have to pay out of pocket for the 16 pills that was the remainder of the one he got when he picked up on 9/5 so the wife will go by and pick it up and able to place it in his pill box and I will see him in 2 wks.   ACTION: Home visit completed  Marylouise Stacks, EMT-Paramedic 11/28/16

## 2016-12-04 MED FILL — CLOPIDOGREL 75 MG TABLET: 75 | 7 days supply | Qty: 7 | Fill #0

## 2016-12-10 ENCOUNTER — Other Ambulatory Visit: Payer: Self-pay | Admitting: Internal Medicine

## 2016-12-17 ENCOUNTER — Other Ambulatory Visit (HOSPITAL_COMMUNITY): Payer: Self-pay

## 2016-12-17 NOTE — Progress Notes (Signed)
Paramedicine Encounter    Patient ID: [name], male    DOB: 1945-04-05, 71 y.o.   MRN: [id number]   Patient Care Team: Wenda Low, MD as PCP - General (Internal Medicine) Lorretta Harp, MD as Consulting Physician (Cardiology)  Patient Active Problem List   Diagnosis Date Noted  . Biventricular ICD (implantable cardioverter-defibrillator) in place 07/04/2016  . PAD (peripheral artery disease) ([place]) 12/27/2015  . Erectile dysfunction 08/29/2015  . CKD (chronic kidney disease) stage 4, GFR 15-29 ml/min (HCC) 01/25/2015  . Diabetes mellitus due to underlying condition with diabetic chronic kidney disease ([place])   . Chronic kidney disease (CKD), stage IV (severe) ([place]) 11/24/2014  . DM (diabetes mellitus), type 2 with renal complications ([place]) 40/98/1191  . Chronic combined systolic and diastolic CHF (congestive heart failure) ([place]) 07/19/2014  . Hypoxia   . CHF exacerbation ([place]) 12/15/2013  . History of carotid artery stenosis 10/05/2013  . Carotid artery disease ([place]) 09/08/2013  . Hypercholesterolemia 09/08/2013  . CHF (congestive heart failure) ([place]) 06/02/2012  . Essential hypertension 06/02/2012  . At risk for sudden cardiac death 04/23/12  . Cardiomyopathy, ischemic 04-23-2012  . S/P ICD (internal cardiac defibrillator) procedure, 04-23-12, Medtronic device ro ICM April 23, 2012  . CKD (chronic kidney disease) stage 3, GFR 30-59 ml/min (HCC) 04/03/2012  . COPD (chronic obstructive pulmonary disease) ([place]) 04/03/2012  . Pulmonary edema  04/01/2012  . Chest pain at rest 04/01/2012  . Diabetes mellitus due to underlying condition ([place]) 11/01/2006  . ABUSE, COCAINE, EPISODIC 11/01/2006  . Polysubstance abuse ([place]) 11/01/2006  . MYOCARDIAL INFARCTION, HX OF 11/01/2006  . CAD (coronary artery disease) 11/01/2006    Current Outpatient Prescriptions:  .  allopurinol (ZYLOPRIM) 100 MG tablet, Take 300 mg by mouth daily as needed (For gout.). , Disp: , Rfl:  .  amLODipine  (NORVASC) 5 MG tablet, TAKE ONE TABLET BY MOUTH IN THE MORNING, Disp: 90 tablet, Rfl: 3 .  aspirin EC 81 MG tablet, Take 1 tablet (81 mg total) by mouth daily., Disp: 90 tablet, Rfl: 3 .  atorvastatin (LIPITOR) 40 MG tablet, Take 1 tablet (40 mg total) by mouth every morning., Disp: 30 tablet, Rfl: 5 .  carvedilol (COREG) 12.5 MG tablet, TAKE 1 TABLET BY MOUTH TWICE DAILY, Disp: 60 tablet, Rfl: 3 .  clopidogrel (PLAVIX) 75 MG tablet, Take 1 tablet (75 mg total) by mouth daily., Disp: 90 tablet, Rfl: 3 .  glimepiride (AMARYL) 1 MG tablet, Take 1 tablet by mouth daily. Reported on 03/10/2015, Disp: , Rfl:  .  hydrALAZINE (APRESOLINE) 100 MG tablet, Take 1 tablet (100 mg total) by mouth 3 (three) times daily., Disp: 90 tablet, Rfl: 5 .  isosorbide mononitrate (IMDUR) 60 MG 24 hr tablet, TAKE ONE & ONE-HALF TABLETS BY MOUTH ONCE DAILY, Disp: 135 tablet, Rfl: 3 .  potassium chloride SA (K-DUR,KLOR-CON) 20 MEQ tablet, Take 1 tablet (20 mEq total) by mouth daily., Disp: 15 tablet, Rfl: 0 .  torsemide (DEMADEX) 20 MG tablet, TAKE 4 TABLETS BY MOUTH EVERY OTHER DAY ALTERNATING WITH 3 TABLETS EVERY OTHER DAY., Disp: 120 tablet, Rfl: 6 .  traZODone (DESYREL) 150 MG tablet, Take 150 mg by mouth at bedtime as needed for sleep. , Disp: , Rfl:  .  docusate sodium (COLACE) 100 MG capsule, Take 1 capsule (100 mg total) by mouth 2 (two) times daily. Hold for diarrhea. (Patient not taking: Reported on 11/14/2016), Disp: 60 capsule, Rfl: 1 .  HYDROcodone-acetaminophen (NORCO/VICODIN) 5-325 MG tablet, Take 1 tablet by mouth  every 4 (four) hours as needed for moderate pain. (Patient not taking: Reported on 09/27/2016), Disp: 10 tablet, Rfl: 0 .  oxyCODONE-acetaminophen (ROXICET) 5-325 MG tablet, Take 1-2 tablets by mouth every 4 (four) hours as needed for severe pain. (Patient not taking: Reported on 06/18/2016), Disp: 31 tablet, Rfl: 0 No Known Allergies   Social History   Social History  . Marital status: Single     Spouse name: N/A  . Number of children: N/A  . Years of education: N/A   Occupational History  . Not on file.   Social History Main Topics  . Smoking status: Former Smoker    Quit date: 04/18/1982  . Smokeless tobacco: Never Used  . Alcohol use No  . Drug use: No     Comment: stopped three years ago  . Sexual activity: No   Other Topics Concern  . Not on file   Social History Narrative  . No narrative on file    Physical Exam      Future Appointments Date Time Provider [place]  01/02/2017 9:05 AM [facility]   BP (!) 112/52   Pulse 70   Resp 15   Wt 166 lb (75.3 kg)   BMI 26.00 kg/m  Weight yesterday-165 Last visit weight-167 CBG PTA-97  Pt reports he is doing great, no complaints. No sob, no h/a. No dizziness. Called in all his meds and he asked that I go get them for him and he will pay me back. His wife is his transportation but she wont be back to get him until tomor. I will have to come back tomor to finish his pill box. He only had enough to do 5 full days of meds.    ACTION: Home visit completed  Marylouise Stacks, EMT-Paramedic 12/17/16

## 2016-12-18 ENCOUNTER — Other Ambulatory Visit (HOSPITAL_COMMUNITY): Payer: Self-pay

## 2016-12-18 NOTE — Progress Notes (Signed)
CAME OUT TODAY TO FINISH UP PILL BOX, I WENT BY PHARMACY AND PICKED UP  HIS MEDS AND BROUGHT THEM OUT TO HIM. 3 PILL BOXES FILLED.

## 2017-01-02 ENCOUNTER — Ambulatory Visit (INDEPENDENT_AMBULATORY_CARE_PROVIDER_SITE_OTHER): Payer: Medicare Other | Admitting: *Deleted

## 2017-01-02 DIAGNOSIS — I255 Ischemic cardiomyopathy: Secondary | ICD-10-CM | POA: Diagnosis not present

## 2017-01-02 DIAGNOSIS — I5042 Chronic combined systolic (congestive) and diastolic (congestive) heart failure: Secondary | ICD-10-CM

## 2017-01-02 NOTE — Progress Notes (Signed)
Remote ICD transmission.   

## 2017-01-04 ENCOUNTER — Encounter: Payer: Self-pay | Admitting: Cardiology

## 2017-01-04 LAB — CUP PACEART REMOTE DEVICE CHECK
Battery Remaining Longevity: 89 mo
Battery Voltage: 2.93 V
Brady Statistic AP VP Percent: 25.31 %
Brady Statistic AP VS Percent: 0.39 %
Brady Statistic AS VS Percent: 1.18 %
Brady Statistic RV Percent Paced: 43.05 %
Date Time Interrogation Session: 20181024062723
HighPow Impedance: 59 Ohm
Implantable Lead Implant Date: 20140207
Implantable Lead Implant Date: 20140207
Implantable Lead Location: 753858
Implantable Lead Location: 753860
Implantable Lead Model: 4298
Implantable Lead Model: 5076
Implantable Pulse Generator Implant Date: 20170105
Lead Channel Impedance Value: 247 Ohm
Lead Channel Impedance Value: 342 Ohm
Lead Channel Impedance Value: 342 Ohm
Lead Channel Impedance Value: 532 Ohm
Lead Channel Impedance Value: 532 Ohm
Lead Channel Impedance Value: 608 Ohm
Lead Channel Pacing Threshold Amplitude: 0.5 V
Lead Channel Pacing Threshold Amplitude: 0.625 V
Lead Channel Pacing Threshold Amplitude: 1.125 V
Lead Channel Pacing Threshold Pulse Width: 0.4 ms
Lead Channel Pacing Threshold Pulse Width: 0.4 ms
Lead Channel Sensing Intrinsic Amplitude: 1.125 mV
Lead Channel Setting Pacing Amplitude: 1.5 V
Lead Channel Setting Sensing Sensitivity: 0.3 mV
MDC IDC LEAD IMPLANT DT: 20170105
MDC IDC LEAD LOCATION: 753859
MDC IDC MSMT LEADCHNL LV IMPEDANCE VALUE: 342 Ohm
MDC IDC MSMT LEADCHNL LV IMPEDANCE VALUE: 399 Ohm
MDC IDC MSMT LEADCHNL LV IMPEDANCE VALUE: 418 Ohm
MDC IDC MSMT LEADCHNL LV IMPEDANCE VALUE: 532 Ohm
MDC IDC MSMT LEADCHNL LV IMPEDANCE VALUE: 589 Ohm
MDC IDC MSMT LEADCHNL LV PACING THRESHOLD PULSEWIDTH: 0.4 ms
MDC IDC MSMT LEADCHNL RA SENSING INTR AMPL: 1.125 mV
MDC IDC MSMT LEADCHNL RV IMPEDANCE VALUE: 304 Ohm
MDC IDC MSMT LEADCHNL RV IMPEDANCE VALUE: 361 Ohm
MDC IDC MSMT LEADCHNL RV SENSING INTR AMPL: 14.875 mV
MDC IDC MSMT LEADCHNL RV SENSING INTR AMPL: 14.875 mV
MDC IDC SET LEADCHNL LV PACING AMPLITUDE: 2.25 V
MDC IDC SET LEADCHNL LV PACING PULSEWIDTH: 0.4 ms
MDC IDC SET LEADCHNL RV PACING AMPLITUDE: 2 V
MDC IDC SET LEADCHNL RV PACING PULSEWIDTH: 0.4 ms
MDC IDC STAT BRADY AS VP PERCENT: 73.11 %
MDC IDC STAT BRADY RA PERCENT PACED: 25.58 %

## 2017-01-10 ENCOUNTER — Other Ambulatory Visit (HOSPITAL_COMMUNITY): Payer: Self-pay

## 2017-01-10 NOTE — Progress Notes (Signed)
Paramedicine Encounter    Patient ID: [name], male    DOB: 30-Jun-1945, 71 y.o.   MRN: [id number]   Patient Care Team: Wenda Low, MD as PCP - General (Internal Medicine) Lorretta Harp, MD as Consulting Physician (Cardiology)  Patient Active Problem List   Diagnosis Date Noted  . Biventricular ICD (implantable cardioverter-defibrillator) in place 07/04/2016  . PAD (peripheral artery disease) ([place]) 12/27/2015  . Erectile dysfunction 08/29/2015  . CKD (chronic kidney disease) stage 4, GFR 15-29 ml/min (HCC) 01/25/2015  . Diabetes mellitus due to underlying condition with diabetic chronic kidney disease ([place])   . Chronic kidney disease (CKD), stage IV (severe) ([place]) 11/24/2014  . DM (diabetes mellitus), type 2 with renal complications ([place]) 72/53/6644  . Chronic combined systolic and diastolic CHF (congestive heart failure) ([place]) 07/19/2014  . Hypoxia   . CHF exacerbation ([place]) 12/15/2013  . History of carotid artery stenosis 10/05/2013  . Carotid artery disease ([place]) 09/08/2013  . Hypercholesterolemia 09/08/2013  . CHF (congestive heart failure) ([place]) 06/02/2012  . Essential hypertension 06/02/2012  . At risk for sudden cardiac death 05/08/2012  . Cardiomyopathy, ischemic May 08, 2012  . S/P ICD (internal cardiac defibrillator) procedure, 08-May-2012, Medtronic device ro ICM 05-08-12  . CKD (chronic kidney disease) stage 3, GFR 30-59 ml/min (HCC) 04/03/2012  . COPD (chronic obstructive pulmonary disease) ([place]) 04/03/2012  . Pulmonary edema  04/01/2012  . Chest pain at rest 04/01/2012  . Diabetes mellitus due to underlying condition ([place]) 11/01/2006  . ABUSE, COCAINE, EPISODIC 11/01/2006  . Polysubstance abuse ([place]) 11/01/2006  . MYOCARDIAL INFARCTION, HX OF 11/01/2006  . CAD (coronary artery disease) 11/01/2006    Current Outpatient Prescriptions:  .  allopurinol (ZYLOPRIM) 100 MG tablet, Take 300 mg by mouth daily as needed (For gout.). , Disp: , Rfl:  .  amLODipine  (NORVASC) 5 MG tablet, TAKE ONE TABLET BY MOUTH IN THE MORNING, Disp: 90 tablet, Rfl: 3 .  aspirin EC 81 MG tablet, Take 1 tablet (81 mg total) by mouth daily., Disp: 90 tablet, Rfl: 3 .  atorvastatin (LIPITOR) 40 MG tablet, Take 1 tablet (40 mg total) by mouth every morning., Disp: 30 tablet, Rfl: 5 .  carvedilol (COREG) 12.5 MG tablet, TAKE 1 TABLET BY MOUTH TWICE DAILY, Disp: 60 tablet, Rfl: 3 .  clopidogrel (PLAVIX) 75 MG tablet, Take 1 tablet (75 mg total) by mouth daily., Disp: 90 tablet, Rfl: 3 .  docusate sodium (COLACE) 100 MG capsule, Take 1 capsule (100 mg total) by mouth 2 (two) times daily. Hold for diarrhea. (Patient not taking: Reported on 11/14/2016), Disp: 60 capsule, Rfl: 1 .  glimepiride (AMARYL) 1 MG tablet, Take 1 tablet by mouth daily. Reported on 03/10/2015, Disp: , Rfl:  .  hydrALAZINE (APRESOLINE) 100 MG tablet, Take 1 tablet (100 mg total) by mouth 3 (three) times daily., Disp: 90 tablet, Rfl: 5 .  HYDROcodone-acetaminophen (NORCO/VICODIN) 5-325 MG tablet, Take 1 tablet by mouth every 4 (four) hours as needed for moderate pain. (Patient not taking: Reported on 09/27/2016), Disp: 10 tablet, Rfl: 0 .  isosorbide mononitrate (IMDUR) 60 MG 24 hr tablet, TAKE ONE & ONE-HALF TABLETS BY MOUTH ONCE DAILY, Disp: 135 tablet, Rfl: 3 .  oxyCODONE-acetaminophen (ROXICET) 5-325 MG tablet, Take 1-2 tablets by mouth every 4 (four) hours as needed for severe pain. (Patient not taking: Reported on 06/18/2016), Disp: 31 tablet, Rfl: 0 .  potassium chloride SA (K-DUR,KLOR-CON) 20 MEQ tablet, Take 1 tablet (20 mEq total) by mouth daily., Disp: 15 tablet,  Rfl: 0 .  torsemide (DEMADEX) 20 MG tablet, TAKE 4 TABLETS BY MOUTH EVERY OTHER DAY ALTERNATING WITH 3 TABLETS EVERY OTHER DAY., Disp: 120 tablet, Rfl: 6 .  traZODone (DESYREL) 150 MG tablet, Take 150 mg by mouth at bedtime as needed for sleep. , Disp: , Rfl:  No Known Allergies   Social History   Social History  . Marital status: Single     Spouse name: N/A  . Number of children: N/A  . Years of education: N/A   Occupational History  . Not on file.   Social History Main Topics  . Smoking status: Former Smoker    Quit date: 04/18/1982  . Smokeless tobacco: Never Used  . Alcohol use No  . Drug use: No     Comment: stopped three years ago  . Sexual activity: No   Other Topics Concern  . Not on file   Social History Narrative  . No narrative on file    Physical Exam      Future Appointments Date Time Provider [place]  04/03/2017 9:45 AM [facility] DEV[facility] REMOT[facility]   There were no vitals taken for this visit. Weight yesterday-167 Last visit weight-166 CBG PTA-79   Pt reports he is doing great, takes all his meds. No complaints. No sob, no dizziness, no h/a-feels good. He will need to get refills next weekend--atorvastatin. Clopidogrel, potassium, hydralazine, trazadone. Other meds are good for a while-will come back in 2 wks to refill pill boxes.    ACTION: Home visit completed  Marylouise Stacks, EMT-Paramedic 01/10/17

## 2017-01-14 ENCOUNTER — Other Ambulatory Visit: Payer: Self-pay | Admitting: Internal Medicine

## 2017-01-21 ENCOUNTER — Other Ambulatory Visit (HOSPITAL_COMMUNITY): Payer: Self-pay

## 2017-01-21 NOTE — Progress Notes (Signed)
Paramedicine Encounter    Patient ID: [name], male    DOB: 07-31-1945, 71 y.o.   MRN: [id number]   Patient Care Team: Wenda Low, MD as PCP - General (Internal Medicine) Lorretta Harp, MD as Consulting Physician (Cardiology)  Patient Active Problem List   Diagnosis Date Noted  . Biventricular ICD (implantable cardioverter-defibrillator) in place 07/04/2016  . PAD (peripheral artery disease) ([place]) 12/27/2015  . Erectile dysfunction 08/29/2015  . CKD (chronic kidney disease) stage 4, GFR 15-29 ml/min (HCC) 01/25/2015  . Diabetes mellitus due to underlying condition with diabetic chronic kidney disease ([place])   . Chronic kidney disease (CKD), stage IV (severe) ([place]) 11/24/2014  . DM (diabetes mellitus), type 2 with renal complications ([place]) 93/81/0175  . Chronic combined systolic and diastolic CHF (congestive heart failure) ([place]) 07/19/2014  . Hypoxia   . CHF exacerbation ([place]) 12/15/2013  . History of carotid artery stenosis 10/05/2013  . Carotid artery disease ([place]) 09/08/2013  . Hypercholesterolemia 09/08/2013  . CHF (congestive heart failure) ([place]) 06/02/2012  . Essential hypertension 06/02/2012  . At risk for sudden cardiac death 04/22/12  . Cardiomyopathy, ischemic April 22, 2012  . S/P ICD (internal cardiac defibrillator) procedure, 2012/04/22, Medtronic device ro ICM 22-Apr-2012  . CKD (chronic kidney disease) stage 3, GFR 30-59 ml/min (HCC) 04/03/2012  . COPD (chronic obstructive pulmonary disease) ([place]) 04/03/2012  . Pulmonary edema  04/01/2012  . Chest pain at rest 04/01/2012  . Diabetes mellitus due to underlying condition ([place]) 11/01/2006  . ABUSE, COCAINE, EPISODIC 11/01/2006  . Polysubstance abuse ([place]) 11/01/2006  . MYOCARDIAL INFARCTION, HX OF 11/01/2006  . CAD (coronary artery disease) 11/01/2006    Current Outpatient Medications:  .  allopurinol (ZYLOPRIM) 100 MG tablet, Take 300 mg by mouth daily as needed (For gout.). , Disp: , Rfl:  .  allopurinol  (ZYLOPRIM) 300 MG tablet, TAKE 1 TABLET BY MOUTH DAILY, Disp: 30 tablet, Rfl: 4 .  amLODipine (NORVASC) 5 MG tablet, TAKE 1 TABLET BY MOUTH EVERY MORNING, Disp: 90 tablet, Rfl: 3 .  aspirin EC 81 MG tablet, Take 1 tablet (81 mg total) by mouth daily., Disp: 90 tablet, Rfl: 3 .  atorvastatin (LIPITOR) 40 MG tablet, Take 1 tablet (40 mg total) by mouth every morning., Disp: 30 tablet, Rfl: 5 .  carvedilol (COREG) 12.5 MG tablet, TAKE 1 TABLET BY MOUTH TWICE DAILY, Disp: 60 tablet, Rfl: 3 .  clopidogrel (PLAVIX) 75 MG tablet, Take 1 tablet (75 mg total) by mouth daily., Disp: 90 tablet, Rfl: 3 .  docusate sodium (COLACE) 100 MG capsule, Take 1 capsule (100 mg total) by mouth 2 (two) times daily. Hold for diarrhea. (Patient not taking: Reported on 11/14/2016), Disp: 60 capsule, Rfl: 1 .  glimepiride (AMARYL) 1 MG tablet, Take 1 tablet by mouth daily. Reported on 03/10/2015, Disp: , Rfl:  .  hydrALAZINE (APRESOLINE) 100 MG tablet, Take 1 tablet (100 mg total) by mouth 3 (three) times daily., Disp: 90 tablet, Rfl: 5 .  HYDROcodone-acetaminophen (NORCO/VICODIN) 5-325 MG tablet, Take 1 tablet by mouth every 4 (four) hours as needed for moderate pain. (Patient not taking: Reported on 09/27/2016), Disp: 10 tablet, Rfl: 0 .  isosorbide mononitrate (IMDUR) 60 MG 24 hr tablet, TAKE ONE & ONE-HALF TABLETS BY MOUTH ONCE DAILY, Disp: 135 tablet, Rfl: 3 .  oxyCODONE-acetaminophen (ROXICET) 5-325 MG tablet, Take 1-2 tablets by mouth every 4 (four) hours as needed for severe pain. (Patient not taking: Reported on 06/18/2016), Disp: 31 tablet, Rfl: 0 .  potassium chloride  SA (K-DUR,KLOR-CON) 20 MEQ tablet, Take 1 tablet (20 mEq total) by mouth daily., Disp: 15 tablet, Rfl: 0 .  torsemide (DEMADEX) 20 MG tablet, TAKE 4 TABLETS BY MOUTH EVERY OTHER DAY ALTERNATING WITH 3 TABLETS EVERY OTHER DAY., Disp: 120 tablet, Rfl: 6 .  traZODone (DESYREL) 150 MG tablet, Take 150 mg by mouth at bedtime as needed for sleep. , Disp: , Rfl:   No Known Allergies   Social History   Socioeconomic History  . Marital status: Single    Spouse name: Not on file  . Number of children: Not on file  . Years of education: Not on file  . Highest education level: Not on file  Social Needs  . Financial resource strain: Not on file  . Food insecurity - worry: Not on file  . Food insecurity - inability: Not on file  . Transportation needs - medical: Not on file  . Transportation needs - non-medical: Not on file  Occupational History  . Not on file  Tobacco Use  . Smoking status: Former Smoker    Last attempt to quit: 04/18/1982    Years since quitting: 34.7  . Smokeless tobacco: Never Used  Substance and Sexual Activity  . Alcohol use: No  . Drug use: No    Comment: stopped three years ago  . Sexual activity: No  Other Topics Concern  . Not on file  Social History Narrative  . Not on file    Physical Exam      Future Appointments  Date Time Provider [place]  04/03/2017  9:45 AM [facility]ICE [facility]OT[facility]   BP (!) 110/52   Pulse 76   Wt 169 lb (76.7 kg)   SpO2 98%   BMI 26.47 kg/m  Weight yesterday-169 Last visit weight-167 CBG PTA-74   Pt reports he is doing well, no sob, no dizziness, no h/a, no c/p.  He die not get his meds from pharmacy and he doesn't have the money today so I will come back Wednesday to get the money and then go get his meds and come back to fill pill boxes--he no longer has transportation since him and his wife broke up again.    ACTION: Home visit completed  Marylouise Stacks, EMT-Paramedic 01/21/17

## 2017-01-23 ENCOUNTER — Other Ambulatory Visit (HOSPITAL_COMMUNITY): Payer: Self-pay

## 2017-01-24 ENCOUNTER — Other Ambulatory Visit (HOSPITAL_COMMUNITY): Payer: Self-pay

## 2017-01-24 NOTE — Progress Notes (Signed)
Came by today to get the money from pt to go get his meds but pt reports his friend hadnt come got him yet to take him to get the money-I advised pt to call me as soon as he gets it so I can come out to do his pill boxes. I never received phone call from him and when I tried to call him again there was no answer.

## 2017-01-24 NOTE — Progress Notes (Signed)
Paramedicine Encounter    Patient ID: [name], male    DOB: September 16, 1945, 71 y.o.   MRN: [id number]   Patient Care Team: Wenda Low, MD as PCP - General (Internal Medicine) Lorretta Harp, MD as Consulting Physician (Cardiology)  Patient Active Problem List   Diagnosis Date Noted  . Biventricular ICD (implantable cardioverter-defibrillator) in place 07/04/2016  . PAD (peripheral artery disease) ([place]) 12/27/2015  . Erectile dysfunction 08/29/2015  . CKD (chronic kidney disease) stage 4, GFR 15-29 ml/min (HCC) 01/25/2015  . Diabetes mellitus due to underlying condition with diabetic chronic kidney disease ([place])   . Chronic kidney disease (CKD), stage IV (severe) ([place]) 11/24/2014  . DM (diabetes mellitus), type 2 with renal complications ([place]) 09/05/9483  . Chronic combined systolic and diastolic CHF (congestive heart failure) ([place]) 07/19/2014  . Hypoxia   . CHF exacerbation ([place]) 12/15/2013  . History of carotid artery stenosis 10/05/2013  . Carotid artery disease ([place]) 09/08/2013  . Hypercholesterolemia 09/08/2013  . CHF (congestive heart failure) ([place]) 06/02/2012  . Essential hypertension 06/02/2012  . At risk for sudden cardiac death 05/16/2012  . Cardiomyopathy, ischemic 05-16-2012  . S/P ICD (internal cardiac defibrillator) procedure, 2012/05/16, Medtronic device ro ICM 2012/05/16  . CKD (chronic kidney disease) stage 3, GFR 30-59 ml/min (HCC) 04/03/2012  . COPD (chronic obstructive pulmonary disease) ([place]) 04/03/2012  . Pulmonary edema  04/01/2012  . Chest pain at rest 04/01/2012  . Diabetes mellitus due to underlying condition ([place]) 11/01/2006  . ABUSE, COCAINE, EPISODIC 11/01/2006  . Polysubstance abuse ([place]) 11/01/2006  . MYOCARDIAL INFARCTION, HX OF 11/01/2006  . CAD (coronary artery disease) 11/01/2006    Current Outpatient Medications:  .  allopurinol (ZYLOPRIM) 100 MG tablet, Take 300 mg by mouth daily as needed (For gout.). , Disp: , Rfl:  .  allopurinol  (ZYLOPRIM) 300 MG tablet, TAKE 1 TABLET BY MOUTH DAILY, Disp: 30 tablet, Rfl: 4 .  amLODipine (NORVASC) 5 MG tablet, TAKE 1 TABLET BY MOUTH EVERY MORNING, Disp: 90 tablet, Rfl: 3 .  aspirin EC 81 MG tablet, Take 1 tablet (81 mg total) by mouth daily., Disp: 90 tablet, Rfl: 3 .  atorvastatin (LIPITOR) 40 MG tablet, Take 1 tablet (40 mg total) by mouth every morning., Disp: 30 tablet, Rfl: 5 .  carvedilol (COREG) 12.5 MG tablet, TAKE 1 TABLET BY MOUTH TWICE DAILY, Disp: 60 tablet, Rfl: 3 .  clopidogrel (PLAVIX) 75 MG tablet, Take 1 tablet (75 mg total) by mouth daily., Disp: 90 tablet, Rfl: 3 .  docusate sodium (COLACE) 100 MG capsule, Take 1 capsule (100 mg total) by mouth 2 (two) times daily. Hold for diarrhea. (Patient not taking: Reported on 11/14/2016), Disp: 60 capsule, Rfl: 1 .  glimepiride (AMARYL) 1 MG tablet, Take 1 tablet by mouth daily. Reported on 03/10/2015, Disp: , Rfl:  .  hydrALAZINE (APRESOLINE) 100 MG tablet, Take 1 tablet (100 mg total) by mouth 3 (three) times daily., Disp: 90 tablet, Rfl: 5 .  HYDROcodone-acetaminophen (NORCO/VICODIN) 5-325 MG tablet, Take 1 tablet by mouth every 4 (four) hours as needed for moderate pain. (Patient not taking: Reported on 09/27/2016), Disp: 10 tablet, Rfl: 0 .  isosorbide mononitrate (IMDUR) 60 MG 24 hr tablet, TAKE ONE & ONE-HALF TABLETS BY MOUTH ONCE DAILY, Disp: 135 tablet, Rfl: 3 .  oxyCODONE-acetaminophen (ROXICET) 5-325 MG tablet, Take 1-2 tablets by mouth every 4 (four) hours as needed for severe pain. (Patient not taking: Reported on 06/18/2016), Disp: 31 tablet, Rfl: 0 .  potassium chloride  SA (K-DUR,KLOR-CON) 20 MEQ tablet, Take 1 tablet (20 mEq total) by mouth daily., Disp: 15 tablet, Rfl: 0 .  torsemide (DEMADEX) 20 MG tablet, TAKE 4 TABLETS BY MOUTH EVERY OTHER DAY ALTERNATING WITH 3 TABLETS EVERY OTHER DAY., Disp: 120 tablet, Rfl: 6 .  traZODone (DESYREL) 150 MG tablet, Take 150 mg by mouth at bedtime as needed for sleep. , Disp: , Rfl:   No Known Allergies   Social History   Socioeconomic History  . Marital status: Single    Spouse name: Not on file  . Number of children: Not on file  . Years of education: Not on file  . Highest education level: Not on file  Social Needs  . Financial resource strain: Not on file  . Food insecurity - worry: Not on file  . Food insecurity - inability: Not on file  . Transportation needs - medical: Not on file  . Transportation needs - non-medical: Not on file  Occupational History  . Not on file  Tobacco Use  . Smoking status: Former Smoker    Last attempt to quit: 04/18/1982    Years since quitting: 34.7  . Smokeless tobacco: Never Used  Substance and Sexual Activity  . Alcohol use: No  . Drug use: No    Comment: stopped three years ago  . Sexual activity: No  Other Topics Concern  . Not on file  Social History Narrative  . Not on file    Physical Exam      Future Appointments  Date Time Provider [place]  04/03/2017  9:45 AM [facility]ICE REMOT[facility]   BP (!) 118/50   Pulse 76   Resp 15   SpO2 98%   CBG PTA-72  Came back out today after I p/u his meds from. pharmacy and brought them to him due to his transportation issues and filled up his pill boxes. Will see him again in 3wks. He will need refill of hydralazine p/u by next visit. Pt complaint free.   ACTION: Home visit completed  Marylouise Stacks, EMT-Paramedic 01/24/17

## 2017-02-13 ENCOUNTER — Other Ambulatory Visit (HOSPITAL_COMMUNITY): Payer: Self-pay

## 2017-02-13 NOTE — Progress Notes (Signed)
Paramedicine Encounter    Patient ID: [name], male    DOB: 07/01/1945, 71 y.o.   MRN: [id number]   Patient Care Team: Wenda Low, MD as PCP - General (Internal Medicine) Lorretta Harp, MD as Consulting Physician (Cardiology)  Patient Active Problem List   Diagnosis Date Noted  . Biventricular ICD (implantable cardioverter-defibrillator) in place 07/04/2016  . PAD (peripheral artery disease) ([place]) 12/27/2015  . Erectile dysfunction 08/29/2015  . CKD (chronic kidney disease) stage 4, GFR 15-29 ml/min (HCC) 01/25/2015  . Diabetes mellitus due to underlying condition with diabetic chronic kidney disease ([place])   . Chronic kidney disease (CKD), stage IV (severe) ([place]) 11/24/2014  . DM (diabetes mellitus), type 2 with renal complications ([place]) 51/88/4166  . Chronic combined systolic and diastolic CHF (congestive heart failure) ([place]) 07/19/2014  . Hypoxia   . CHF exacerbation ([place]) 12/15/2013  . History of carotid artery stenosis 10/05/2013  . Carotid artery disease ([place]) 09/08/2013  . Hypercholesterolemia 09/08/2013  . CHF (congestive heart failure) ([place]) 06/02/2012  . Essential hypertension 06/02/2012  . At risk for sudden cardiac death May 13, 2012  . Cardiomyopathy, ischemic 2012-05-13  . S/P ICD (internal cardiac defibrillator) procedure, 2012-05-13, Medtronic device ro ICM 13-May-2012  . CKD (chronic kidney disease) stage 3, GFR 30-59 ml/min (HCC) 04/03/2012  . COPD (chronic obstructive pulmonary disease) ([place]) 04/03/2012  . Pulmonary edema  04/01/2012  . Chest pain at rest 04/01/2012  . Diabetes mellitus due to underlying condition ([place]) 11/01/2006  . ABUSE, COCAINE, EPISODIC 11/01/2006  . Polysubstance abuse ([place]) 11/01/2006  . MYOCARDIAL INFARCTION, HX OF 11/01/2006  . CAD (coronary artery disease) 11/01/2006    Current Outpatient Medications:  .  allopurinol (ZYLOPRIM) 100 MG tablet, Take 300 mg by mouth daily as needed (For gout.). , Disp: , Rfl:  .  amLODipine  (NORVASC) 5 MG tablet, TAKE 1 TABLET BY MOUTH EVERY MORNING, Disp: 90 tablet, Rfl: 3 .  aspirin EC 81 MG tablet, Take 1 tablet (81 mg total) by mouth daily., Disp: 90 tablet, Rfl: 3 .  atorvastatin (LIPITOR) 40 MG tablet, Take 1 tablet (40 mg total) by mouth every morning., Disp: 30 tablet, Rfl: 5 .  carvedilol (COREG) 12.5 MG tablet, TAKE 1 TABLET BY MOUTH TWICE DAILY, Disp: 60 tablet, Rfl: 3 .  clopidogrel (PLAVIX) 75 MG tablet, Take 1 tablet (75 mg total) by mouth daily., Disp: 90 tablet, Rfl: 3 .  glimepiride (AMARYL) 1 MG tablet, Take 1 tablet by mouth daily. Reported on 03/10/2015, Disp: , Rfl:  .  hydrALAZINE (APRESOLINE) 100 MG tablet, Take 1 tablet (100 mg total) by mouth 3 (three) times daily., Disp: 90 tablet, Rfl: 5 .  isosorbide mononitrate (IMDUR) 60 MG 24 hr tablet, TAKE ONE & ONE-HALF TABLETS BY MOUTH ONCE DAILY, Disp: 135 tablet, Rfl: 3 .  potassium chloride SA (K-DUR,KLOR-CON) 20 MEQ tablet, Take 1 tablet (20 mEq total) by mouth daily., Disp: 15 tablet, Rfl: 0 .  torsemide (DEMADEX) 20 MG tablet, TAKE 4 TABLETS BY MOUTH EVERY OTHER DAY ALTERNATING WITH 3 TABLETS EVERY OTHER DAY., Disp: 120 tablet, Rfl: 6 .  traZODone (DESYREL) 150 MG tablet, Take 150 mg by mouth at bedtime as needed for sleep. , Disp: , Rfl:  .  allopurinol (ZYLOPRIM) 300 MG tablet, TAKE 1 TABLET BY MOUTH DAILY, Disp: 30 tablet, Rfl: 4 .  docusate sodium (COLACE) 100 MG capsule, Take 1 capsule (100 mg total) by mouth 2 (two) times daily. Hold for diarrhea. (Patient not taking: Reported on 11/14/2016),  Disp: 60 capsule, Rfl: 1 .  HYDROcodone-acetaminophen (NORCO/VICODIN) 5-325 MG tablet, Take 1 tablet by mouth every 4 (four) hours as needed for moderate pain. (Patient not taking: Reported on 09/27/2016), Disp: 10 tablet, Rfl: 0 .  oxyCODONE-acetaminophen (ROXICET) 5-325 MG tablet, Take 1-2 tablets by mouth every 4 (four) hours as needed for severe pain. (Patient not taking: Reported on 06/18/2016), Disp: 31 tablet, Rfl:  0 No Known Allergies   Social History   Socioeconomic History  . Marital status: Single    Spouse name: Not on file  . Number of children: Not on file  . Years of education: Not on file  . Highest education level: Not on file  Social Needs  . Financial resource strain: Not on file  . Food insecurity - worry: Not on file  . Food insecurity - inability: Not on file  . Transportation needs - medical: Not on file  . Transportation needs - non-medical: Not on file  Occupational History  . Not on file  Tobacco Use  . Smoking status: Former Smoker    Last attempt to quit: 04/18/1982    Years since quitting: 34.8  . Smokeless tobacco: Never Used  Substance and Sexual Activity  . Alcohol use: No  . Drug use: No    Comment: stopped three years ago  . Sexual activity: No  Other Topics Concern  . Not on file  Social History Narrative  . Not on file    Physical Exam      Future Appointments  Date Time Provider [place]  04/03/2017  9:45 AM [facility]   BP (!) 108/52   Pulse 68   Resp 14   Wt 159 lb (72.1 kg)   SpO2 97%   BMI 24.90 kg/m  Weight yesterday- Last visit weight-169  CBG PTA-84  Pt reports he is doing great. No complaints.no sob, no pains, no dizziness, no issues. No swelling noted.  I went yesterday to get his hydralazine for him @ pharmacy due to his transportation issues. meds verified and I  filled up 4 pill boxes for him. Will need to get isosorbide, carvedilol, glimepiride, clopidogrel, potassium-if poss-refilled for next visit.  Will come back out in 4 wks.    ACTION: Home visit completed  Marylouise Stacks, EMT-Paramedic 02/13/17

## 2017-03-11 ENCOUNTER — Other Ambulatory Visit: Payer: Self-pay | Admitting: Cardiology

## 2017-03-11 MED ORDER — CARVEDILOL 12.5 MG PO TABS: 12.5000 mg | ORAL_TABLET | Freq: Two times a day (BID) | ORAL | 0 refills | Status: DC

## 2017-03-14 ENCOUNTER — Other Ambulatory Visit (HOSPITAL_COMMUNITY): Payer: Self-pay

## 2017-03-14 ENCOUNTER — Telehealth (HOSPITAL_COMMUNITY): Payer: Self-pay

## 2017-03-14 ENCOUNTER — Other Ambulatory Visit: Payer: Self-pay | Admitting: Internal Medicine

## 2017-03-14 DIAGNOSIS — M160 Bilateral primary osteoarthritis of hip: Secondary | ICD-10-CM | POA: Diagnosis not present

## 2017-03-14 DIAGNOSIS — M545 Low back pain: Secondary | ICD-10-CM | POA: Diagnosis not present

## 2017-03-14 MED ORDER — CLOPIDOGREL BISULFATE 75 MG PO TABS: 75.0000 mg | ORAL_TABLET | Freq: Every day | ORAL | 3 refills | Status: AC

## 2017-03-14 MED ORDER — TORSEMIDE 20 MG PO TABS: ORAL_TABLET | ORAL | 6 refills | Status: DC

## 2017-03-14 MED ORDER — CLOPIDOGREL BISULFATE 75 MG PO TABS: 75.0000 mg | ORAL_TABLET | Freq: Every day | ORAL | 3 refills | Status: DC

## 2017-03-14 NOTE — Progress Notes (Signed)
Paramedicine Encounter    Patient ID: [name], male    DOB: 1945-12-26, 72 y.o.   MRN: [id number]   Patient Care Team: Wenda Low, MD as PCP - General (Internal Medicine) Lorretta Harp, MD as Consulting Physician (Cardiology)  Patient Active Problem List   Diagnosis Date Noted  . Biventricular ICD (implantable cardioverter-defibrillator) in place 07/04/2016  . PAD (peripheral artery disease) ([place]) 12/27/2015  . Erectile dysfunction 08/29/2015  . CKD (chronic kidney disease) stage 4, GFR 15-29 ml/min (HCC) 01/25/2015  . Diabetes mellitus due to underlying condition with diabetic chronic kidney disease ([place])   . Chronic kidney disease (CKD), stage IV (severe) ([place]) 11/24/2014  . DM (diabetes mellitus), type 2 with renal complications ([place]) 53/29/9242  . Chronic combined systolic and diastolic CHF (congestive heart failure) ([place]) 07/19/2014  . Hypoxia   . CHF exacerbation ([place]) 12/15/2013  . History of carotid artery stenosis 10/05/2013  . Carotid artery disease ([place]) 09/08/2013  . Hypercholesterolemia 09/08/2013  . CHF (congestive heart failure) ([place]) 06/02/2012  . Essential hypertension 06/02/2012  . At risk for sudden cardiac death May 14, 2012  . Cardiomyopathy, ischemic 2012-05-14  . S/P ICD (internal cardiac defibrillator) procedure, 05/14/2012, Medtronic device ro ICM 2012/05/14  . CKD (chronic kidney disease) stage 3, GFR 30-59 ml/min (HCC) 04/03/2012  . COPD (chronic obstructive pulmonary disease) ([place]) 04/03/2012  . Pulmonary edema  04/01/2012  . Chest pain at rest 04/01/2012  . Diabetes mellitus due to underlying condition ([place]) 11/01/2006  . ABUSE, COCAINE, EPISODIC 11/01/2006  . Polysubstance abuse ([place]) 11/01/2006  . MYOCARDIAL INFARCTION, HX OF 11/01/2006  . CAD (coronary artery disease) 11/01/2006    Current Outpatient Medications:  .  allopurinol (ZYLOPRIM) 100 MG tablet, Take 300 mg by mouth daily as needed (For gout.). , Disp: , Rfl:  .  allopurinol  (ZYLOPRIM) 300 MG tablet, TAKE 1 TABLET BY MOUTH DAILY, Disp: 30 tablet, Rfl: 4 .  amLODipine (NORVASC) 5 MG tablet, TAKE 1 TABLET BY MOUTH EVERY MORNING, Disp: 90 tablet, Rfl: 3 .  aspirin EC 81 MG tablet, Take 1 tablet (81 mg total) by mouth daily., Disp: 90 tablet, Rfl: 3 .  atorvastatin (LIPITOR) 40 MG tablet, Take 1 tablet (40 mg total) by mouth every morning., Disp: 30 tablet, Rfl: 5 .  carvedilol (COREG) 12.5 MG tablet, Take 1 tablet (12.5 mg total) by mouth 2 (two) times daily. Please make overdue yearly appt with Dr. Curt Bears. 2nd attempt, Disp: 30 tablet, Rfl: 0 .  clopidogrel (PLAVIX) 75 MG tablet, Take 1 tablet (75 mg total) by mouth daily., Disp: 90 tablet, Rfl: 3 .  glimepiride (AMARYL) 1 MG tablet, Take 1 tablet by mouth daily. Reported on 03/10/2015, Disp: , Rfl:  .  hydrALAZINE (APRESOLINE) 100 MG tablet, Take 1 tablet (100 mg total) by mouth 3 (three) times daily., Disp: 90 tablet, Rfl: 5 .  isosorbide mononitrate (IMDUR) 60 MG 24 hr tablet, TAKE ONE & ONE-HALF TABLETS BY MOUTH ONCE DAILY, Disp: 135 tablet, Rfl: 3 .  potassium chloride SA (K-DUR,KLOR-CON) 20 MEQ tablet, Take 1 tablet (20 mEq total) by mouth daily., Disp: 15 tablet, Rfl: 0 .  torsemide (DEMADEX) 20 MG tablet, TAKE 4 TABLETS BY MOUTH EVERY OTHER DAY ALTERNATING WITH 3 TABLETS EVERY OTHER DAY., Disp: 120 tablet, Rfl: 6 .  traZODone (DESYREL) 150 MG tablet, Take 150 mg by mouth at bedtime as needed for sleep. , Disp: , Rfl:  .  docusate sodium (COLACE) 100 MG capsule, Take 1 capsule (100 mg total)  by mouth 2 (two) times daily. Hold for diarrhea. (Patient not taking: Reported on 11/14/2016), Disp: 60 capsule, Rfl: 1 .  HYDROcodone-acetaminophen (NORCO/VICODIN) 5-325 MG tablet, Take 1 tablet by mouth every 4 (four) hours as needed for moderate pain. (Patient not taking: Reported on 09/27/2016), Disp: 10 tablet, Rfl: 0 .  oxyCODONE-acetaminophen (ROXICET) 5-325 MG tablet, Take 1-2 tablets by mouth every 4 (four) hours as needed  for severe pain. (Patient not taking: Reported on 06/18/2016), Disp: 31 tablet, Rfl: 0 No Known Allergies   Social History   Socioeconomic History  . Marital status: Single    Spouse name: Not on file  . Number of children: Not on file  . Years of education: Not on file  . Highest education level: Not on file  Social Needs  . Financial resource strain: Not on file  . Food insecurity - worry: Not on file  . Food insecurity - inability: Not on file  . Transportation needs - medical: Not on file  . Transportation needs - non-medical: Not on file  Occupational History  . Not on file  Tobacco Use  . Smoking status: Former Smoker    Last attempt to quit: 04/18/1982    Years since quitting: 34.9  . Smokeless tobacco: Never Used  Substance and Sexual Activity  . Alcohol use: No  . Drug use: No    Comment: stopped three years ago  . Sexual activity: No  Other Topics Concern  . Not on file  Social History Narrative  . Not on file    Physical Exam      Future Appointments  Date Time Provider [place]  04/03/2017  9:45 AM [facility] DEVICE REMOTES [facility]   BP (!) 116/58   Pulse 94   Resp 15   Wt 163 lb (73.9 kg)   SpO2 99%   BMI 25.53 kg/m  Weight yesterday-164 Last visit weight-159 CBG PTA-79  Pt states he is doing good, we got 2 Rxs at [facility] that needs to be filled once they get the Rx from the clinic and he has enough pills in pill box to get him through next Wednesday so I will return then and complete all 4 boxes.  Pt still seems to be doing great, he needs to call dr Jethro Poling office to sch appointment with them. Clinic was able to send in the refills he needed for his heart meds. He needs refill on trazadone as well by hussain but he will need to contact him to sch visit.  Pt denies any sob, no dizziness, no swelling noted, no h/a.   ACTION: Home visit completed  Marylouise Stacks, EMT-Paramedic 03/14/17

## 2017-03-14 NOTE — Telephone Encounter (Signed)
I called the pharmacy to see if all of pts refills were ready for p/u-she advised 2 of them needed refills from doc before it could be filled. I called dr Jethro Poling office and left a message as that was the doctor that prescribed it to verify that they got the refill request--one of the assistants from that office advised that he has not been seen in a year and did not keep him last appointment in December and she is not willing to refill them again until he comes in to the office to be seen. One of those medications is clopidogrel. She is aware he is about to run out tomorrow.   Marylouise Stacks, EMT-Paramedic 03/14/17  .

## 2017-03-20 ENCOUNTER — Other Ambulatory Visit (HOSPITAL_COMMUNITY): Payer: Self-pay

## 2017-03-20 NOTE — Progress Notes (Signed)
P/u pts meds today at pharmacy-he wasn't available for me to come out today as planned-will see him in the morning.   Marylouise Stacks, EMT-Paramedic 03/20/17

## 2017-03-21 ENCOUNTER — Other Ambulatory Visit (HOSPITAL_COMMUNITY): Payer: Self-pay

## 2017-03-21 NOTE — Progress Notes (Signed)
Paramedicine Encounter    Patient ID: [name], male    DOB: Aug 12, 1945, 71 y.o.   MRN: [id number]   Patient Care Team: Wenda Low, MD as PCP - General (Internal Medicine) Lorretta Harp, MD as Consulting Physician (Cardiology)  Patient Active Problem List   Diagnosis Date Noted  . Biventricular ICD (implantable cardioverter-defibrillator) in place 07/04/2016  . PAD (peripheral artery disease) ([place]) 12/27/2015  . Erectile dysfunction 08/29/2015  . CKD (chronic kidney disease) stage 4, GFR 15-29 ml/min (HCC) 01/25/2015  . Diabetes mellitus due to underlying condition with diabetic chronic kidney disease ([place])   . Chronic kidney disease (CKD), stage IV (severe) ([place]) 11/24/2014  . DM (diabetes mellitus), type 2 with renal complications ([place]) 25/42/7062  . Chronic combined systolic and diastolic CHF (congestive heart failure) ([place]) 07/19/2014  . Hypoxia   . CHF exacerbation ([place]) 12/15/2013  . History of carotid artery stenosis 10/05/2013  . Carotid artery disease ([place]) 09/08/2013  . Hypercholesterolemia 09/08/2013  . CHF (congestive heart failure) ([place]) 06/02/2012  . Essential hypertension 06/02/2012  . At risk for sudden cardiac death 2012-04-29  . Cardiomyopathy, ischemic 04-29-2012  . S/P ICD (internal cardiac defibrillator) procedure, 04-29-2012, Medtronic device ro ICM 04-29-2012  . CKD (chronic kidney disease) stage 3, GFR 30-59 ml/min (HCC) 04/03/2012  . COPD (chronic obstructive pulmonary disease) ([place]) 04/03/2012  . Pulmonary edema  04/01/2012  . Chest pain at rest 04/01/2012  . Diabetes mellitus due to underlying condition ([place]) 11/01/2006  . ABUSE, COCAINE, EPISODIC 11/01/2006  . Polysubstance abuse ([place]) 11/01/2006  . MYOCARDIAL INFARCTION, HX OF 11/01/2006  . CAD (coronary artery disease) 11/01/2006    Current Outpatient Medications:  .  allopurinol (ZYLOPRIM) 100 MG tablet, Take 300 mg by mouth daily as needed (For gout.). , Disp: , Rfl:  .  allopurinol  (ZYLOPRIM) 300 MG tablet, TAKE 1 TABLET BY MOUTH DAILY, Disp: 30 tablet, Rfl: 4 .  amLODipine (NORVASC) 5 MG tablet, TAKE 1 TABLET BY MOUTH EVERY MORNING, Disp: 90 tablet, Rfl: 3 .  aspirin EC 81 MG tablet, Take 1 tablet (81 mg total) by mouth daily., Disp: 90 tablet, Rfl: 3 .  atorvastatin (LIPITOR) 40 MG tablet, Take 1 tablet (40 mg total) by mouth every morning., Disp: 30 tablet, Rfl: 5 .  carvedilol (COREG) 12.5 MG tablet, Take 1 tablet (12.5 mg total) by mouth 2 (two) times daily. Please make overdue yearly appt with Dr. Curt Bears. 2nd attempt, Disp: 30 tablet, Rfl: 0 .  clopidogrel (PLAVIX) 75 MG tablet, Take 1 tablet (75 mg total) by mouth daily., Disp: 90 tablet, Rfl: 3 .  glimepiride (AMARYL) 1 MG tablet, Take 1 tablet by mouth daily. Reported on 03/10/2015, Disp: , Rfl:  .  hydrALAZINE (APRESOLINE) 100 MG tablet, Take 1 tablet (100 mg total) by mouth 3 (three) times daily., Disp: 90 tablet, Rfl: 5 .  isosorbide mononitrate (IMDUR) 60 MG 24 hr tablet, TAKE 1 AND 1/2 TABLET BY MOUTH DAILY, Disp: 90 tablet, Rfl: 2 .  potassium chloride SA (K-DUR,KLOR-CON) 20 MEQ tablet, Take 1 tablet (20 mEq total) by mouth daily., Disp: 15 tablet, Rfl: 0 .  torsemide (DEMADEX) 20 MG tablet, TAKE 4 TABLETS BY MOUTH EVERY OTHER DAY ALTERNATING WITH 3 TABLETS EVERY OTHER DAY., Disp: 120 tablet, Rfl: 6 .  traZODone (DESYREL) 150 MG tablet, Take 150 mg by mouth at bedtime as needed for sleep. , Disp: , Rfl:  .  docusate sodium (COLACE) 100 MG capsule, Take 1 capsule (100 mg total) by  mouth 2 (two) times daily. Hold for diarrhea. (Patient not taking: Reported on 11/14/2016), Disp: 60 capsule, Rfl: 1 .  HYDROcodone-acetaminophen (NORCO/VICODIN) 5-325 MG tablet, Take 1 tablet by mouth every 4 (four) hours as needed for moderate pain. (Patient not taking: Reported on 09/27/2016), Disp: 10 tablet, Rfl: 0 .  oxyCODONE-acetaminophen (ROXICET) 5-325 MG tablet, Take 1-2 tablets by mouth every 4 (four) hours as needed for severe  pain. (Patient not taking: Reported on 06/18/2016), Disp: 31 tablet, Rfl: 0 No Known Allergies   Social History   Socioeconomic History  . Marital status: Single    Spouse name: Not on file  . Number of children: Not on file  . Years of education: Not on file  . Highest education level: Not on file  Social Needs  . Financial resource strain: Not on file  . Food insecurity - worry: Not on file  . Food insecurity - inability: Not on file  . Transportation needs - medical: Not on file  . Transportation needs - non-medical: Not on file  Occupational History  . Not on file  Tobacco Use  . Smoking status: Former Smoker    Last attempt to quit: 04/18/1982    Years since quitting: 34.9  . Smokeless tobacco: Never Used  Substance and Sexual Activity  . Alcohol use: No  . Drug use: No    Comment: stopped three years ago  . Sexual activity: No  Other Topics Concern  . Not on file  Social History Narrative  . Not on file    Physical Exam      Future Appointments  Date Time Provider [place]  04/03/2017  9:45 AM [facility] DEVICE REMOTES [facility]   BP (!) 116/0 Comment: palpated  Pulse 68   Wt 164 lb (74.4 kg)   BMI 25.69 kg/m   Came out today for med rec to fill all 4 of his pill boxes--he will need carvedilol, atorvastatin, glimepiride in 4wks but will have to come out in 2 wks to bring out the hydralazine.  Tried to sch the annual visit for his PCP office that he missed in December but they report he has a balance due of $76.73 that will need to be paid in full before he is seen again.  He said he would call and handle that.   ACTION: Home visit completed  Marylouise Stacks, EMT-Paramedic 03/21/17

## 2017-04-03 ENCOUNTER — Ambulatory Visit (INDEPENDENT_AMBULATORY_CARE_PROVIDER_SITE_OTHER): Payer: Medicare Other | Admitting: *Deleted

## 2017-04-03 DIAGNOSIS — I255 Ischemic cardiomyopathy: Secondary | ICD-10-CM

## 2017-04-03 DIAGNOSIS — I5042 Chronic combined systolic (congestive) and diastolic (congestive) heart failure: Secondary | ICD-10-CM

## 2017-04-03 NOTE — Progress Notes (Signed)
Remote ICD transmission.   

## 2017-04-04 ENCOUNTER — Encounter: Payer: Self-pay | Admitting: Cardiology

## 2017-04-08 ENCOUNTER — Other Ambulatory Visit (HOSPITAL_COMMUNITY): Payer: Self-pay | Admitting: Pharmacist

## 2017-04-08 ENCOUNTER — Other Ambulatory Visit (HOSPITAL_COMMUNITY): Payer: Self-pay

## 2017-04-08 MED ORDER — HYDRALAZINE HCL 100 MG PO TABS: 100.0000 mg | ORAL_TABLET | Freq: Three times a day (TID) | ORAL | 5 refills | Status: DC

## 2017-04-08 NOTE — Progress Notes (Signed)
Paramedicine Encounter    Patient ID: [name], male    DOB: 1945/04/17, 72 y.o.   MRN: [id number]   Patient Care Team: Wenda Low, MD as PCP - General (Internal Medicine) Lorretta Harp, MD as Consulting Physician (Cardiology)  Patient Active Problem List   Diagnosis Date Noted  . Biventricular ICD (implantable cardioverter-defibrillator) in place 07/04/2016  . PAD (peripheral artery disease) ([place]) 12/27/2015  . Erectile dysfunction 08/29/2015  . CKD (chronic kidney disease) stage 4, GFR 15-29 ml/min (HCC) 01/25/2015  . Diabetes mellitus due to underlying condition with diabetic chronic kidney disease ([place])   . Chronic kidney disease (CKD), stage IV (severe) ([place]) 11/24/2014  . DM (diabetes mellitus), type 2 with renal complications ([place]) 85/63/1497  . Chronic combined systolic and diastolic CHF (congestive heart failure) ([place]) 07/19/2014  . Hypoxia   . CHF exacerbation ([place]) 12/15/2013  . History of carotid artery stenosis 10/05/2013  . Carotid artery disease ([place]) 09/08/2013  . Hypercholesterolemia 09/08/2013  . CHF (congestive heart failure) ([place]) 06/02/2012  . Essential hypertension 06/02/2012  . At risk for sudden cardiac death 05-11-12  . Cardiomyopathy, ischemic 2012/05/11  . S/P ICD (internal cardiac defibrillator) procedure, 2012-05-11, Medtronic device ro ICM 05-11-2012  . CKD (chronic kidney disease) stage 3, GFR 30-59 ml/min (HCC) 04/03/2012  . COPD (chronic obstructive pulmonary disease) ([place]) 04/03/2012  . Pulmonary edema  04/01/2012  . Chest pain at rest 04/01/2012  . Diabetes mellitus due to underlying condition ([place]) 11/01/2006  . ABUSE, COCAINE, EPISODIC 11/01/2006  . Polysubstance abuse ([place]) 11/01/2006  . MYOCARDIAL INFARCTION, HX OF 11/01/2006  . CAD (coronary artery disease) 11/01/2006    Current Outpatient Medications:  .  allopurinol (ZYLOPRIM) 100 MG tablet, Take 300 mg by mouth daily as needed (For gout.). , Disp: , Rfl:  .  allopurinol  (ZYLOPRIM) 300 MG tablet, TAKE 1 TABLET BY MOUTH DAILY, Disp: 30 tablet, Rfl: 4 .  amLODipine (NORVASC) 5 MG tablet, TAKE 1 TABLET BY MOUTH EVERY MORNING, Disp: 90 tablet, Rfl: 3 .  aspirin EC 81 MG tablet, Take 1 tablet (81 mg total) by mouth daily., Disp: 90 tablet, Rfl: 3 .  atorvastatin (LIPITOR) 40 MG tablet, Take 1 tablet (40 mg total) by mouth every morning., Disp: 30 tablet, Rfl: 5 .  carvedilol (COREG) 12.5 MG tablet, Take 1 tablet (12.5 mg total) by mouth 2 (two) times daily. Please make overdue yearly appt with Dr. Curt Bears. 2nd attempt, Disp: 30 tablet, Rfl: 0 .  clopidogrel (PLAVIX) 75 MG tablet, Take 1 tablet (75 mg total) by mouth daily., Disp: 90 tablet, Rfl: 3 .  glimepiride (AMARYL) 1 MG tablet, Take 1 tablet by mouth daily. Reported on 03/10/2015, Disp: , Rfl:  .  hydrALAZINE (APRESOLINE) 100 MG tablet, Take 1 tablet (100 mg total) by mouth 3 (three) times daily., Disp: 90 tablet, Rfl: 5 .  isosorbide mononitrate (IMDUR) 60 MG 24 hr tablet, TAKE 1 AND 1/2 TABLET BY MOUTH DAILY, Disp: 90 tablet, Rfl: 2 .  potassium chloride SA (K-DUR,KLOR-CON) 20 MEQ tablet, Take 1 tablet (20 mEq total) by mouth daily., Disp: 15 tablet, Rfl: 0 .  torsemide (DEMADEX) 20 MG tablet, TAKE 4 TABLETS BY MOUTH EVERY OTHER DAY ALTERNATING WITH 3 TABLETS EVERY OTHER DAY., Disp: 120 tablet, Rfl: 6 .  traZODone (DESYREL) 150 MG tablet, Take 150 mg by mouth at bedtime as needed for sleep. , Disp: , Rfl:  .  docusate sodium (COLACE) 100 MG capsule, Take 1 capsule (100 mg total) by  mouth 2 (two) times daily. Hold for diarrhea. (Patient not taking: Reported on 11/14/2016), Disp: 60 capsule, Rfl: 1 .  HYDROcodone-acetaminophen (NORCO/VICODIN) 5-325 MG tablet, Take 1 tablet by mouth every 4 (four) hours as needed for moderate pain. (Patient not taking: Reported on 09/27/2016), Disp: 10 tablet, Rfl: 0 .  oxyCODONE-acetaminophen (ROXICET) 5-325 MG tablet, Take 1-2 tablets by mouth every 4 (four) hours as needed for severe  pain. (Patient not taking: Reported on 06/18/2016), Disp: 31 tablet, Rfl: 0 No Known Allergies   Social History   Socioeconomic History  . Marital status: Single    Spouse name: Not on file  . Number of children: Not on file  . Years of education: Not on file  . Highest education level: Not on file  Social Needs  . Financial resource strain: Not on file  . Food insecurity - worry: Not on file  . Food insecurity - inability: Not on file  . Transportation needs - medical: Not on file  . Transportation needs - non-medical: Not on file  Occupational History  . Not on file  Tobacco Use  . Smoking status: Former Smoker    Last attempt to quit: 04/18/1982    Years since quitting: 34.9  . Smokeless tobacco: Never Used  Substance and Sexual Activity  . Alcohol use: No  . Drug use: No    Comment: stopped three years ago  . Sexual activity: No  Other Topics Concern  . Not on file  Social History Narrative  . Not on file    Physical Exam      Future Appointments  Date Time Provider [place]  07/03/2017  8:25 AM [facility] DEVICE REMOTES [facility]   BP (!) 126/50   Pulse 84   Resp 15   Wt 164 lb (74.4 kg)   SpO2 97%   BMI 25.69 kg/m  Weight yesterday-163 Last visit weight-164 CBG PTA-79  Pt reports he is doing ok, he states he has been coughing up white phlegm since yesterday.  went by to p/u hydralazine from pharmacy. He went a few days without the hydralazine-it was sent to pharmacy last week but never heard back from doctor-I asked them to send the request to dr mclean--pt states he still had some days left to take however a couple of those were without the hydralazine but he had everything else. This morning pharmacy states they dont have the refill request so I asked Doroteo Bradford to fax it over and it was done then.   Needs Carvedilol in 2 wks. meds verified and pill box refilled. He states he has been taking BC powders for the cough--I advised against  that. He states he is going to see his PCP beginning of next month and he is going to pay that balance so he is able to be seen again. Pt denies any sob. I told him he could take tylenol. And to consult with pharmacist to help him choose meds with his HTN tha the could take to relieve those symptoms. If he begins to feel worsee reports he will go to ER.    ACTION: Home visit completed  Marylouise Stacks, EMT-Paramedic 04/08/17

## 2017-04-16 DIAGNOSIS — E1165 Type 2 diabetes mellitus with hyperglycemia: Secondary | ICD-10-CM | POA: Diagnosis not present

## 2017-04-16 DIAGNOSIS — M109 Gout, unspecified: Secondary | ICD-10-CM | POA: Diagnosis not present

## 2017-04-16 DIAGNOSIS — R062 Wheezing: Secondary | ICD-10-CM | POA: Diagnosis not present

## 2017-04-16 DIAGNOSIS — G8929 Other chronic pain: Secondary | ICD-10-CM | POA: Diagnosis not present

## 2017-04-16 DIAGNOSIS — I509 Heart failure, unspecified: Secondary | ICD-10-CM | POA: Diagnosis not present

## 2017-04-16 DIAGNOSIS — Z23 Encounter for immunization: Secondary | ICD-10-CM | POA: Diagnosis not present

## 2017-04-16 DIAGNOSIS — N184 Chronic kidney disease, stage 4 (severe): Secondary | ICD-10-CM | POA: Diagnosis not present

## 2017-04-16 DIAGNOSIS — E1122 Type 2 diabetes mellitus with diabetic chronic kidney disease: Secondary | ICD-10-CM | POA: Diagnosis not present

## 2017-04-16 DIAGNOSIS — M1712 Unilateral primary osteoarthritis, left knee: Secondary | ICD-10-CM | POA: Diagnosis not present

## 2017-04-16 DIAGNOSIS — Z7984 Long term (current) use of oral hypoglycemic drugs: Secondary | ICD-10-CM | POA: Diagnosis not present

## 2017-04-16 DIAGNOSIS — E782 Mixed hyperlipidemia: Secondary | ICD-10-CM | POA: Diagnosis not present

## 2017-04-16 DIAGNOSIS — I10 Essential (primary) hypertension: Secondary | ICD-10-CM | POA: Diagnosis not present

## 2017-04-16 DIAGNOSIS — I6523 Occlusion and stenosis of bilateral carotid arteries: Secondary | ICD-10-CM | POA: Diagnosis not present

## 2017-04-17 LAB — CUP PACEART REMOTE DEVICE CHECK
Battery Remaining Longevity: 82 mo
Battery Voltage: 2.91 V
Brady Statistic RA Percent Paced: 23.82 %
HighPow Impedance: 48 Ohm
Implantable Lead Location: 753859
Implantable Lead Location: 753860
Implantable Lead Model: 4298
Implantable Lead Model: 5076
Implantable Pulse Generator Implant Date: 20170105
Lead Channel Impedance Value: 247 Ohm
Lead Channel Impedance Value: 285 Ohm
Lead Channel Impedance Value: 285 Ohm
Lead Channel Impedance Value: 342 Ohm
Lead Channel Impedance Value: 342 Ohm
Lead Channel Impedance Value: 475 Ohm
Lead Channel Impedance Value: 513 Ohm
Lead Channel Impedance Value: 551 Ohm
Lead Channel Pacing Threshold Amplitude: 0.5 V
Lead Channel Pacing Threshold Amplitude: 0.75 V
Lead Channel Pacing Threshold Pulse Width: 0.4 ms
Lead Channel Sensing Intrinsic Amplitude: 1.125 mV
Lead Channel Sensing Intrinsic Amplitude: 1.125 mV
Lead Channel Sensing Intrinsic Amplitude: 12.875 mV
Lead Channel Setting Pacing Amplitude: 2 V
Lead Channel Setting Sensing Sensitivity: 0.3 mV
MDC IDC LEAD IMPLANT DT: 20140207
MDC IDC LEAD IMPLANT DT: 20140207
MDC IDC LEAD IMPLANT DT: 20170105
MDC IDC LEAD LOCATION: 753858
MDC IDC MSMT LEADCHNL LV IMPEDANCE VALUE: 399 Ohm
MDC IDC MSMT LEADCHNL LV IMPEDANCE VALUE: 513 Ohm
MDC IDC MSMT LEADCHNL LV IMPEDANCE VALUE: 513 Ohm
MDC IDC MSMT LEADCHNL LV PACING THRESHOLD AMPLITUDE: 1.25 V
MDC IDC MSMT LEADCHNL LV PACING THRESHOLD PULSEWIDTH: 0.4 ms
MDC IDC MSMT LEADCHNL RA IMPEDANCE VALUE: 304 Ohm
MDC IDC MSMT LEADCHNL RA PACING THRESHOLD PULSEWIDTH: 0.4 ms
MDC IDC MSMT LEADCHNL RV IMPEDANCE VALUE: 399 Ohm
MDC IDC MSMT LEADCHNL RV SENSING INTR AMPL: 12.875 mV
MDC IDC SESS DTM: 20190123083622
MDC IDC SET LEADCHNL LV PACING AMPLITUDE: 2.25 V
MDC IDC SET LEADCHNL LV PACING PULSEWIDTH: 0.4 ms
MDC IDC SET LEADCHNL RA PACING AMPLITUDE: 1.5 V
MDC IDC SET LEADCHNL RV PACING PULSEWIDTH: 0.4 ms
MDC IDC STAT BRADY AP VP PERCENT: 23.53 %
MDC IDC STAT BRADY AP VS PERCENT: 0.38 %
MDC IDC STAT BRADY AS VP PERCENT: 74.86 %
MDC IDC STAT BRADY AS VS PERCENT: 1.23 %
MDC IDC STAT BRADY RV PERCENT PACED: 40.24 %

## 2017-04-29 ENCOUNTER — Other Ambulatory Visit: Payer: Self-pay

## 2017-04-29 ENCOUNTER — Emergency Department (HOSPITAL_COMMUNITY): Payer: Medicare Other

## 2017-04-29 ENCOUNTER — Encounter (HOSPITAL_COMMUNITY): Payer: Self-pay

## 2017-04-29 ENCOUNTER — Emergency Department (HOSPITAL_COMMUNITY)
Admission: EM | Admit: 2017-04-29 | Discharge: 2017-04-29 | Disposition: A | Discharge status: Home / Self Care | Payer: Medicare Other | Attending: Emergency Medicine | Admitting: Emergency Medicine

## 2017-04-29 DIAGNOSIS — I5042 Chronic combined systolic (congestive) and diastolic (congestive) heart failure: Secondary | ICD-10-CM | POA: Diagnosis not present

## 2017-04-29 DIAGNOSIS — J069 Acute upper respiratory infection, unspecified: Secondary | ICD-10-CM | POA: Diagnosis not present

## 2017-04-29 DIAGNOSIS — N183 Chronic kidney disease, stage 3 (moderate): Secondary | ICD-10-CM | POA: Insufficient documentation

## 2017-04-29 DIAGNOSIS — E785 Hyperlipidemia, unspecified: Secondary | ICD-10-CM | POA: Insufficient documentation

## 2017-04-29 DIAGNOSIS — Z7984 Long term (current) use of oral hypoglycemic drugs: Secondary | ICD-10-CM | POA: Diagnosis not present

## 2017-04-29 DIAGNOSIS — Z87891 Personal history of nicotine dependence: Secondary | ICD-10-CM | POA: Diagnosis not present

## 2017-04-29 DIAGNOSIS — I2510 Atherosclerotic heart disease of native coronary artery without angina pectoris: Secondary | ICD-10-CM | POA: Insufficient documentation

## 2017-04-29 DIAGNOSIS — R079 Chest pain, unspecified: Secondary | ICD-10-CM | POA: Diagnosis not present

## 2017-04-29 DIAGNOSIS — Z7902 Long term (current) use of antithrombotics/antiplatelets: Secondary | ICD-10-CM | POA: Diagnosis not present

## 2017-04-29 DIAGNOSIS — R05 Cough: Secondary | ICD-10-CM | POA: Diagnosis not present

## 2017-04-29 DIAGNOSIS — Z951 Presence of aortocoronary bypass graft: Secondary | ICD-10-CM | POA: Diagnosis not present

## 2017-04-29 DIAGNOSIS — Z7982 Long term (current) use of aspirin: Secondary | ICD-10-CM | POA: Insufficient documentation

## 2017-04-29 DIAGNOSIS — Z95810 Presence of automatic (implantable) cardiac defibrillator: Secondary | ICD-10-CM | POA: Insufficient documentation

## 2017-04-29 DIAGNOSIS — I130 Hypertensive heart and chronic kidney disease with heart failure and stage 1 through stage 4 chronic kidney disease, or unspecified chronic kidney disease: Secondary | ICD-10-CM | POA: Diagnosis not present

## 2017-04-29 DIAGNOSIS — R0602 Shortness of breath: Secondary | ICD-10-CM | POA: Diagnosis not present

## 2017-04-29 DIAGNOSIS — I252 Old myocardial infarction: Secondary | ICD-10-CM | POA: Diagnosis not present

## 2017-04-29 DIAGNOSIS — Z79899 Other long term (current) drug therapy: Secondary | ICD-10-CM | POA: Diagnosis not present

## 2017-04-29 LAB — INFLUENZA PANEL BY PCR (TYPE A & B)
Influenza A By PCR: NEGATIVE
Influenza B By PCR: NEGATIVE

## 2017-04-29 MED ORDER — AZITHROMYCIN 250 MG PO TABS: 250.0000 mg | ORAL_TABLET | Freq: Every day | ORAL | 0 refills | Status: DC

## 2017-04-29 NOTE — ED Triage Notes (Signed)
Pt presents to the ed with complaints of chest pain, cough, shortness of breath and congestion. States he feels like he has pneumonia.

## 2017-04-29 NOTE — Discharge Instructions (Signed)
Please read attached information. If you experience any new or worsening signs or symptoms please return to the emergency room for evaluation. Please follow-up with your primary care provider or specialist as discussed. Please use medication prescribed only as directed and discontinue taking if you have any concerning signs or symptoms.   °
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
## 2017-04-30 ENCOUNTER — Other Ambulatory Visit (HOSPITAL_COMMUNITY): Payer: Self-pay | Admitting: *Deleted

## 2017-04-30 MED ORDER — CARVEDILOL 12.5 MG PO TABS: 12.5000 mg | ORAL_TABLET | Freq: Two times a day (BID) | ORAL | 3 refills | Status: DC

## 2017-05-01 ENCOUNTER — Other Ambulatory Visit (HOSPITAL_COMMUNITY): Payer: Self-pay

## 2017-05-01 NOTE — Progress Notes (Signed)
Paramedicine Encounter    Patient ID: [name], male    DOB: [birth year], 72 y.o.   MRN: [id number]   Patient Care Team: Wenda Low, MD as PCP - General (Internal Medicine) Lorretta Harp, MD as Consulting Physician (Cardiology)  Patient Active Problem List   Diagnosis Date Noted  . Biventricular ICD (implantable cardioverter-defibrillator) in place 07/04/2016  . PAD (peripheral artery disease) ([place]) 12/27/2015  . Erectile dysfunction 08/29/2015  . CKD (chronic kidney disease) stage 4, GFR 15-29 ml/min (HCC) 01/25/2015  . Diabetes mellitus due to underlying condition with diabetic chronic kidney disease ([place])   . Chronic kidney disease (CKD), stage IV (severe) ([place]) 11/24/2014  . DM (diabetes mellitus), type 2 with renal complications ([place]) 48/18/5631  . Chronic combined systolic and diastolic CHF (congestive heart failure) ([place]) 07/19/2014  . Hypoxia   . CHF exacerbation ([place]) 12/15/2013  . History of carotid artery stenosis 10/05/2013  . Carotid artery disease ([place]) 09/08/2013  . Hypercholesterolemia 09/08/2013  . CHF (congestive heart failure) ([place]) 06/02/2012  . Essential hypertension 06/02/2012  . At risk for sudden cardiac death 02-May-2012  . Cardiomyopathy, ischemic 05-02-12  . S/P ICD (internal cardiac defibrillator) procedure, 2012/05/02, Medtronic device ro ICM 05/02/2012  . CKD (chronic kidney disease) stage 3, GFR 30-59 ml/min (HCC) 04/03/2012  . COPD (chronic obstructive pulmonary disease) ([place]) 04/03/2012  . Pulmonary edema  04/01/2012  . Chest pain at rest 04/01/2012  . Diabetes mellitus due to underlying condition ([place]) 11/01/2006  . ABUSE, COCAINE, EPISODIC 11/01/2006  . Polysubstance abuse ([place]) 11/01/2006  . MYOCARDIAL INFARCTION, HX OF 11/01/2006  . CAD (coronary artery disease) 11/01/2006    Current Outpatient Medications:  .  allopurinol (ZYLOPRIM) 100 MG tablet, Take 300 mg by mouth daily as needed (For gout.). , Disp: , Rfl:  .  allopurinol  (ZYLOPRIM) 300 MG tablet, TAKE 1 TABLET BY MOUTH DAILY, Disp: 30 tablet, Rfl: 4 .  amLODipine (NORVASC) 5 MG tablet, TAKE 1 TABLET BY MOUTH EVERY MORNING, Disp: 90 tablet, Rfl: 3 .  aspirin EC 81 MG tablet, Take 1 tablet (81 mg total) by mouth daily., Disp: 90 tablet, Rfl: 3 .  atorvastatin (LIPITOR) 40 MG tablet, Take 1 tablet (40 mg total) by mouth every morning., Disp: 30 tablet, Rfl: 5 .  azithromycin (ZITHROMAX) 250 MG tablet, Take 1 tablet (250 mg total) by mouth daily. Take first 2 tablets together, then 1 every day until finished., Disp: 6 tablet, Rfl: 0 .  carvedilol (COREG) 12.5 MG tablet, Take 1 tablet (12.5 mg total) by mouth 2 (two) times daily. Please make overdue yearly appt with Dr. Curt Bears. 2nd attempt, Disp: 30 tablet, Rfl: 3 .  clopidogrel (PLAVIX) 75 MG tablet, Take 1 tablet (75 mg total) by mouth daily., Disp: 90 tablet, Rfl: 3 .  glimepiride (AMARYL) 1 MG tablet, Take 1 tablet by mouth daily. Reported on 03/10/2015, Disp: , Rfl:  .  hydrALAZINE (APRESOLINE) 100 MG tablet, Take 1 tablet (100 mg total) by mouth 3 (three) times daily., Disp: 90 tablet, Rfl: 5 .  isosorbide mononitrate (IMDUR) 60 MG 24 hr tablet, TAKE 1 AND 1/2 TABLET BY MOUTH DAILY, Disp: 90 tablet, Rfl: 2 .  potassium chloride SA (K-DUR,KLOR-CON) 20 MEQ tablet, Take 1 tablet (20 mEq total) by mouth daily., Disp: 15 tablet, Rfl: 0 .  torsemide (DEMADEX) 20 MG tablet, TAKE 4 TABLETS BY MOUTH EVERY OTHER DAY ALTERNATING WITH 3 TABLETS EVERY OTHER DAY., Disp: 120 tablet, Rfl: 6 .  traZODone (DESYREL) 150 MG  tablet, Take 150 mg by mouth at bedtime as needed for sleep. , Disp: , Rfl:  .  docusate sodium (COLACE) 100 MG capsule, Take 1 capsule (100 mg total) by mouth 2 (two) times daily. Hold for diarrhea. (Patient not taking: Reported on 11/14/2016), Disp: 60 capsule, Rfl: 1 .  HYDROcodone-acetaminophen (NORCO/VICODIN) 5-325 MG tablet, Take 1 tablet by mouth every 4 (four) hours as needed for moderate pain. (Patient not  taking: Reported on 09/27/2016), Disp: 10 tablet, Rfl: 0 .  oxyCODONE-acetaminophen (ROXICET) 5-325 MG tablet, Take 1-2 tablets by mouth every 4 (four) hours as needed for severe pain. (Patient not taking: Reported on 06/18/2016), Disp: 31 tablet, Rfl: 0 No Known Allergies    Social History   Socioeconomic History  . Marital status: Single    Spouse name: Not on file  . Number of children: Not on file  . Years of education: Not on file  . Highest education level: Not on file  Social Needs  . Financial resource strain: Not on file  . Food insecurity - worry: Not on file  . Food insecurity - inability: Not on file  . Transportation needs - medical: Not on file  . Transportation needs - non-medical: Not on file  Occupational History  . Not on file  Tobacco Use  . Smoking status: Former Smoker    Last attempt to quit: 04/18/1982    Years since quitting: 35.0  . Smokeless tobacco: Never Used  Substance and Sexual Activity  . Alcohol use: No  . Drug use: No    Comment: stopped three years ago  . Sexual activity: No  Other Topics Concern  . Not on file  Social History Narrative  . Not on file    Physical Exam      Future Appointments  Date Time Provider [place]  07/03/2017  8:25 AM [facility] DEVICE REMOTES [facility]    BP 130/70   Pulse 86   Resp 15   Wt 163 lb (73.9 kg)   SpO2 97%   BMI 25.53 kg/m   Weight yesterday-? Last visit weight-164  Pt states he is feeling better, he had to go to ER the other day for URI symptoms. He was given a z-pack and has been taking it.  Pt denies any sob, no dizziness. No h/a.   Glimepiride, amlodipine  Atorvastatin for next visit in 2 wks.  Need to see if he is eligible for extra help program--his net income is 17,[phone]--he has $1608 taken out yearly for premiums--roughly take home pay is $[phone] Will see in 2 wks for med rec again once   meds are picked up.     Marylouise Stacks,  [facility] Paramedic  05/01/17

## 2017-05-13 ENCOUNTER — Other Ambulatory Visit (HOSPITAL_COMMUNITY): Payer: Self-pay | Admitting: *Deleted

## 2017-05-13 MED ORDER — ATORVASTATIN CALCIUM 40 MG PO TABS: 40.0000 mg | ORAL_TABLET | ORAL | 5 refills | Status: DC

## 2017-05-15 ENCOUNTER — Other Ambulatory Visit (HOSPITAL_COMMUNITY): Payer: Self-pay

## 2017-05-15 NOTE — Progress Notes (Signed)
Paramedicine Encounter    Patient ID: [name], male    DOB: [birth year], 72 y.o.   MRN: [id number]   Patient Care Team: Wenda Low, MD as PCP - General (Internal Medicine) Lorretta Harp, MD as Consulting Physician (Cardiology)  Patient Active Problem List   Diagnosis Date Noted  . Biventricular ICD (implantable cardioverter-defibrillator) in place 07/04/2016  . PAD (peripheral artery disease) ([place]) 12/27/2015  . Erectile dysfunction 08/29/2015  . CKD (chronic kidney disease) stage 4, GFR 15-29 ml/min (HCC) 01/25/2015  . Diabetes mellitus due to underlying condition with diabetic chronic kidney disease ([place])   . Chronic kidney disease (CKD), stage IV (severe) ([place]) 11/24/2014  . DM (diabetes mellitus), type 2 with renal complications ([place]) 40/97/3532  . Chronic combined systolic and diastolic CHF (congestive heart failure) ([place]) 07/19/2014  . Hypoxia   . CHF exacerbation ([place]) 12/15/2013  . History of carotid artery stenosis 10/05/2013  . Carotid artery disease ([place]) 09/08/2013  . Hypercholesterolemia 09/08/2013  . CHF (congestive heart failure) ([place]) 06/02/2012  . Essential hypertension 06/02/2012  . At risk for sudden cardiac death 2012/05/17  . Cardiomyopathy, ischemic May 17, 2012  . S/P ICD (internal cardiac defibrillator) procedure, 05-17-2012, Medtronic device ro ICM 2012-05-17  . CKD (chronic kidney disease) stage 3, GFR 30-59 ml/min (HCC) 04/03/2012  . COPD (chronic obstructive pulmonary disease) ([place]) 04/03/2012  . Pulmonary edema  04/01/2012  . Chest pain at rest 04/01/2012  . Diabetes mellitus due to underlying condition ([place]) 11/01/2006  . ABUSE, COCAINE, EPISODIC 11/01/2006  . Polysubstance abuse ([place]) 11/01/2006  . MYOCARDIAL INFARCTION, HX OF 11/01/2006  . CAD (coronary artery disease) 11/01/2006    Current Outpatient Medications:  .  allopurinol (ZYLOPRIM) 100 MG tablet, Take 300 mg by mouth daily as needed (For gout.). , Disp: , Rfl:  .  allopurinol  (ZYLOPRIM) 300 MG tablet, TAKE 1 TABLET BY MOUTH DAILY, Disp: 30 tablet, Rfl: 4 .  amLODipine (NORVASC) 5 MG tablet, TAKE 1 TABLET BY MOUTH EVERY MORNING, Disp: 90 tablet, Rfl: 3 .  aspirin EC 81 MG tablet, Take 1 tablet (81 mg total) by mouth daily., Disp: 90 tablet, Rfl: 3 .  atorvastatin (LIPITOR) 40 MG tablet, Take 1 tablet (40 mg total) by mouth every morning., Disp: 30 tablet, Rfl: 5 .  carvedilol (COREG) 12.5 MG tablet, Take 1 tablet (12.5 mg total) by mouth 2 (two) times daily. Please make overdue yearly appt with Dr. Curt Bears. 2nd attempt, Disp: 30 tablet, Rfl: 3 .  clopidogrel (PLAVIX) 75 MG tablet, Take 1 tablet (75 mg total) by mouth daily., Disp: 90 tablet, Rfl: 3 .  glimepiride (AMARYL) 1 MG tablet, Take 1 tablet by mouth daily. Reported on 03/10/2015, Disp: , Rfl:  .  hydrALAZINE (APRESOLINE) 100 MG tablet, Take 1 tablet (100 mg total) by mouth 3 (three) times daily., Disp: 90 tablet, Rfl: 5 .  isosorbide mononitrate (IMDUR) 60 MG 24 hr tablet, TAKE 1 AND 1/2 TABLET BY MOUTH DAILY, Disp: 90 tablet, Rfl: 2 .  potassium chloride SA (K-DUR,KLOR-CON) 20 MEQ tablet, Take 1 tablet (20 mEq total) by mouth daily., Disp: 15 tablet, Rfl: 0 .  torsemide (DEMADEX) 20 MG tablet, TAKE 4 TABLETS BY MOUTH EVERY OTHER DAY ALTERNATING WITH 3 TABLETS EVERY OTHER DAY., Disp: 120 tablet, Rfl: 6 .  traZODone (DESYREL) 150 MG tablet, Take 150 mg by mouth at bedtime as needed for sleep. , Disp: , Rfl:  .  azithromycin (ZITHROMAX) 250 MG tablet, Take 1 tablet (250 mg total) by mouth  daily. Take first 2 tablets together, then 1 every day until finished. (Patient not taking: Reported on 05/15/2017), Disp: 6 tablet, Rfl: 0 .  docusate sodium (COLACE) 100 MG capsule, Take 1 capsule (100 mg total) by mouth 2 (two) times daily. Hold for diarrhea. (Patient not taking: Reported on 11/14/2016), Disp: 60 capsule, Rfl: 1 .  HYDROcodone-acetaminophen (NORCO/VICODIN) 5-325 MG tablet, Take 1 tablet by mouth every 4 (four) hours as  needed for moderate pain. (Patient not taking: Reported on 09/27/2016), Disp: 10 tablet, Rfl: 0 .  oxyCODONE-acetaminophen (ROXICET) 5-325 MG tablet, Take 1-2 tablets by mouth every 4 (four) hours as needed for severe pain. (Patient not taking: Reported on 06/18/2016), Disp: 31 tablet, Rfl: 0 No Known Allergies    Social History   Socioeconomic History  . Marital status: Single    Spouse name: Not on file  . Number of children: Not on file  . Years of education: Not on file  . Highest education level: Not on file  Social Needs  . Financial resource strain: Not on file  . Food insecurity - worry: Not on file  . Food insecurity - inability: Not on file  . Transportation needs - medical: Not on file  . Transportation needs - non-medical: Not on file  Occupational History  . Not on file  Tobacco Use  . Smoking status: Former Smoker    Last attempt to quit: 04/18/1982    Years since quitting: 35.0  . Smokeless tobacco: Never Used  Substance and Sexual Activity  . Alcohol use: No  . Drug use: No    Comment: stopped three years ago  . Sexual activity: No  Other Topics Concern  . Not on file  Social History Narrative  . Not on file    Physical Exam      Future Appointments  Date Time Provider [place]  07/03/2017  8:25 AM [facility] DEVICE REMOTES [facility]    BP (!) 128/58   Pulse 68   Resp 15   Wt 169 lb (76.7 kg)   SpO2 97%   BMI 26.47 kg/m   Weight yesterday-168 Last visit weight-163 CBG PTA-79  Pt reports he is feeling good, no complaints at this time. I picked his meds up for him today and brought them to him-he was not able to afford the ventolin inhaler so that was placed back. Pt denies any sob, no pains, no dizziness, no h/a.  meds verified and 4 pill boxes refilled.  He needs his trazadone and allopurinol picked up and he asked if I could get that and drop off for him tomor so I will do that also.     Marylouise Stacks,  [facility] Paramedic  05/15/17

## 2017-05-17 ENCOUNTER — Other Ambulatory Visit (HOSPITAL_COMMUNITY): Payer: Self-pay

## 2017-05-17 NOTE — Progress Notes (Signed)
I saw Mr Favorite today to drop off his medications. He stated he was doing well and did not need me to put them in his pillbox.

## 2017-05-29 DIAGNOSIS — E1122 Type 2 diabetes mellitus with diabetic chronic kidney disease: Secondary | ICD-10-CM | POA: Diagnosis not present

## 2017-05-29 DIAGNOSIS — Z7984 Long term (current) use of oral hypoglycemic drugs: Secondary | ICD-10-CM | POA: Diagnosis not present

## 2017-05-29 DIAGNOSIS — M109 Gout, unspecified: Secondary | ICD-10-CM | POA: Diagnosis not present

## 2017-06-07 ENCOUNTER — Other Ambulatory Visit (HOSPITAL_COMMUNITY): Payer: Self-pay | Admitting: Cardiology

## 2017-06-12 ENCOUNTER — Other Ambulatory Visit (HOSPITAL_COMMUNITY): Payer: Self-pay

## 2017-06-12 NOTE — Progress Notes (Signed)
Paramedicine Encounter    Patient ID: [name], male    DOB: Jun 14, 1945, 72 y.o.   MRN: [id number]   Patient Care Team: Wenda Low, MD as PCP - General (Internal Medicine) Lorretta Harp, MD as Consulting Physician (Cardiology)  Patient Active Problem List   Diagnosis Date Noted  . Biventricular ICD (implantable cardioverter-defibrillator) in place 07/04/2016  . PAD (peripheral artery disease) ([place]) 12/27/2015  . Erectile dysfunction 08/29/2015  . CKD (chronic kidney disease) stage 4, GFR 15-29 ml/min (HCC) 01/25/2015  . Diabetes mellitus due to underlying condition with diabetic chronic kidney disease ([place])   . Chronic kidney disease (CKD), stage IV (severe) ([place]) 11/24/2014  . DM (diabetes mellitus), type 2 with renal complications ([place]) 95/18/8416  . Chronic combined systolic and diastolic CHF (congestive heart failure) ([place]) 07/19/2014  . Hypoxia   . CHF exacerbation ([place]) 12/15/2013  . History of carotid artery stenosis 10/05/2013  . Carotid artery disease ([place]) 09/08/2013  . Hypercholesterolemia 09/08/2013  . CHF (congestive heart failure) ([place]) 06/02/2012  . Essential hypertension 06/02/2012  . At risk for sudden cardiac death 2012/05/16  . Cardiomyopathy, ischemic 2012/05/16  . S/P ICD (internal cardiac defibrillator) procedure, 05/16/12, Medtronic device ro ICM 05-16-12  . CKD (chronic kidney disease) stage 3, GFR 30-59 ml/min (HCC) 04/03/2012  . COPD (chronic obstructive pulmonary disease) ([place]) 04/03/2012  . Pulmonary edema  04/01/2012  . Chest pain at rest 04/01/2012  . Diabetes mellitus due to underlying condition ([place]) 11/01/2006  . ABUSE, COCAINE, EPISODIC 11/01/2006  . Polysubstance abuse ([place]) 11/01/2006  . MYOCARDIAL INFARCTION, HX OF 11/01/2006  . CAD (coronary artery disease) 11/01/2006    Current Outpatient Medications:  .  allopurinol (ZYLOPRIM) 100 MG tablet, Take 300 mg by mouth daily as needed (For gout.). , Disp: , Rfl:  .  allopurinol  (ZYLOPRIM) 300 MG tablet, TAKE 1 TABLET BY MOUTH DAILY, Disp: 30 tablet, Rfl: 4 .  amLODipine (NORVASC) 5 MG tablet, TAKE 1 TABLET BY MOUTH EVERY MORNING, Disp: 90 tablet, Rfl: 3 .  aspirin EC 81 MG tablet, Take 1 tablet (81 mg total) by mouth daily., Disp: 90 tablet, Rfl: 3 .  atorvastatin (LIPITOR) 40 MG tablet, Take 1 tablet (40 mg total) by mouth every morning., Disp: 30 tablet, Rfl: 5 .  carvedilol (COREG) 12.5 MG tablet, PLEASE SEE ATTACHED FOR DETAILED DIRECTIONS, Disp: 30 tablet, Rfl: 2 .  clopidogrel (PLAVIX) 75 MG tablet, Take 1 tablet (75 mg total) by mouth daily., Disp: 90 tablet, Rfl: 3 .  glimepiride (AMARYL) 1 MG tablet, Take 1 tablet by mouth daily. Reported on 03/10/2015, Disp: , Rfl:  .  hydrALAZINE (APRESOLINE) 100 MG tablet, Take 1 tablet (100 mg total) by mouth 3 (three) times daily., Disp: 90 tablet, Rfl: 5 .  isosorbide mononitrate (IMDUR) 60 MG 24 hr tablet, TAKE 1 AND 1/2 TABLET BY MOUTH DAILY, Disp: 90 tablet, Rfl: 2 .  potassium chloride SA (K-DUR,KLOR-CON) 20 MEQ tablet, Take 1 tablet (20 mEq total) by mouth daily., Disp: 15 tablet, Rfl: 0 .  torsemide (DEMADEX) 20 MG tablet, TAKE 4 TABLETS BY MOUTH EVERY OTHER DAY ALTERNATING WITH 3 TABLETS EVERY OTHER DAY., Disp: 120 tablet, Rfl: 6 .  traZODone (DESYREL) 150 MG tablet, Take 150 mg by mouth at bedtime as needed for sleep. , Disp: , Rfl:  .  azithromycin (ZITHROMAX) 250 MG tablet, Take 1 tablet (250 mg total) by mouth daily. Take first 2 tablets together, then 1 every day until finished. (Patient not taking: Reported  on 05/15/2017), Disp: 6 tablet, Rfl: 0 .  docusate sodium (COLACE) 100 MG capsule, Take 1 capsule (100 mg total) by mouth 2 (two) times daily. Hold for diarrhea. (Patient not taking: Reported on 11/14/2016), Disp: 60 capsule, Rfl: 1 .  HYDROcodone-acetaminophen (NORCO/VICODIN) 5-325 MG tablet, Take 1 tablet by mouth every 4 (four) hours as needed for moderate pain. (Patient not taking: Reported on 09/27/2016), Disp:  10 tablet, Rfl: 0 .  oxyCODONE-acetaminophen (ROXICET) 5-325 MG tablet, Take 1-2 tablets by mouth every 4 (four) hours as needed for severe pain. (Patient not taking: Reported on 06/18/2016), Disp: 31 tablet, Rfl: 0 No Known Allergies    Social History   Socioeconomic History  . Marital status: Single    Spouse name: Not on file  . Number of children: Not on file  . Years of education: Not on file  . Highest education level: Not on file  Occupational History  . Not on file  Social Needs  . Financial resource strain: Not on file  . Food insecurity:    Worry: Not on file    Inability: Not on file  . Transportation needs:    Medical: Not on file    Non-medical: Not on file  Tobacco Use  . Smoking status: Former Smoker    Last attempt to quit: 04/18/1982    Years since quitting: 35.1  . Smokeless tobacco: Never Used  Substance and Sexual Activity  . Alcohol use: No  . Drug use: No    Frequency: 7.0 times per week    Types: Marijuana    Comment: stopped three years ago  . Sexual activity: Never  Lifestyle  . Physical activity:    Days per week: Not on file    Minutes per session: Not on file  . Stress: Not on file  Relationships  . Social connections:    Talks on phone: Not on file    Gets together: Not on file    Attends religious service: Not on file    Active member of club or organization: Not on file    Attends meetings of clubs or organizations: Not on file    Relationship status: Not on file  . Intimate partner violence:    Fear of current or ex partner: Not on file    Emotionally abused: Not on file    Physically abused: Not on file    Forced sexual activity: Not on file  Other Topics Concern  . Not on file  Social History Narrative  . Not on file    Physical Exam      Future Appointments  Date Time Provider [place]  07/03/2017  8:25 AM [facility] DEVICE REMOTES [facility]    BP 132/70   Pulse 70   Resp 14   Wt 167 lb  (75.8 kg)   SpO2 98%   BMI 26.16 kg/m   CBG EMS-92  Pt reports he is feeling good, no complaints at this time. No swelling no sob, no dizziness. Pt reports his brother passed away last week and left him the car so he got the insurance and will get the car this weekend so transportation shouldn't be an issue any further. meds verified and pill boxes refilled-only 2 weeks was able to be done.  Called in trazadone, carvedilol, isosorbide, clopidogrel-should be ready in the next day or two- He got a new glucometer and it was reviewed how to use it.   Weight yesterday-? Last visit weight-169    [facility]  Donnal Debar, [facility] Paramedic  06/12/17

## 2017-06-26 ENCOUNTER — Encounter: Payer: Medicare Other | Admitting: Cardiovascular Disease

## 2017-06-26 ENCOUNTER — Other Ambulatory Visit (HOSPITAL_COMMUNITY): Payer: Self-pay

## 2017-06-26 NOTE — Progress Notes (Signed)
Paramedicine Encounter    Patient ID: [name], male    DOB: 08/13/1945, 72 y.o.   MRN: [id number]   Patient Care Team: Wenda Low, MD as PCP - General (Internal Medicine) Lorretta Harp, MD as Consulting Physician (Cardiology)  Patient Active Problem List   Diagnosis Date Noted  . Biventricular ICD (implantable cardioverter-defibrillator) in place 07/04/2016  . PAD (peripheral artery disease) ([place]) 12/27/2015  . Erectile dysfunction 08/29/2015  . CKD (chronic kidney disease) stage 4, GFR 15-29 ml/min (HCC) 01/25/2015  . Diabetes mellitus due to underlying condition with diabetic chronic kidney disease ([place])   . Chronic kidney disease (CKD), stage IV (severe) ([place]) 11/24/2014  . DM (diabetes mellitus), type 2 with renal complications ([place]) 30/86/5784  . Chronic combined systolic and diastolic CHF (congestive heart failure) ([place]) 07/19/2014  . Hypoxia   . CHF exacerbation ([place]) 12/15/2013  . History of carotid artery stenosis 10/05/2013  . Carotid artery disease ([place]) 09/08/2013  . Hypercholesterolemia 09/08/2013  . CHF (congestive heart failure) ([place]) 06/02/2012  . Essential hypertension 06/02/2012  . At risk for sudden cardiac death 04/26/12  . Cardiomyopathy, ischemic 04-26-12  . S/P ICD (internal cardiac defibrillator) procedure, 2012/04/26, Medtronic device ro ICM 04/26/12  . CKD (chronic kidney disease) stage 3, GFR 30-59 ml/min (HCC) 04/03/2012  . COPD (chronic obstructive pulmonary disease) ([place]) 04/03/2012  . Pulmonary edema  04/01/2012  . Chest pain at rest 04/01/2012  . Diabetes mellitus due to underlying condition ([place]) 11/01/2006  . ABUSE, COCAINE, EPISODIC 11/01/2006  . Polysubstance abuse ([place]) 11/01/2006  . MYOCARDIAL INFARCTION, HX OF 11/01/2006  . CAD (coronary artery disease) 11/01/2006    Current Outpatient Medications:  .  allopurinol (ZYLOPRIM) 100 MG tablet, Take 300 mg by mouth daily as needed (For gout.). , Disp: , Rfl:  .  amLODipine  (NORVASC) 5 MG tablet, TAKE 1 TABLET BY MOUTH EVERY MORNING, Disp: 90 tablet, Rfl: 3 .  aspirin EC 81 MG tablet, Take 1 tablet (81 mg total) by mouth daily., Disp: 90 tablet, Rfl: 3 .  atorvastatin (LIPITOR) 40 MG tablet, Take 1 tablet (40 mg total) by mouth every morning., Disp: 30 tablet, Rfl: 5 .  carvedilol (COREG) 12.5 MG tablet, PLEASE SEE ATTACHED FOR DETAILED DIRECTIONS, Disp: 30 tablet, Rfl: 2 .  clopidogrel (PLAVIX) 75 MG tablet, Take 1 tablet (75 mg total) by mouth daily., Disp: 90 tablet, Rfl: 3 .  glimepiride (AMARYL) 1 MG tablet, Take 1 tablet by mouth daily. Reported on 03/10/2015, Disp: , Rfl:  .  hydrALAZINE (APRESOLINE) 100 MG tablet, Take 1 tablet (100 mg total) by mouth 3 (three) times daily., Disp: 90 tablet, Rfl: 5 .  isosorbide mononitrate (IMDUR) 60 MG 24 hr tablet, TAKE 1 AND 1/2 TABLET BY MOUTH DAILY, Disp: 90 tablet, Rfl: 2 .  potassium chloride SA (K-DUR,KLOR-CON) 20 MEQ tablet, Take 1 tablet (20 mEq total) by mouth daily., Disp: 15 tablet, Rfl: 0 .  torsemide (DEMADEX) 20 MG tablet, TAKE 4 TABLETS BY MOUTH EVERY OTHER DAY ALTERNATING WITH 3 TABLETS EVERY OTHER DAY., Disp: 120 tablet, Rfl: 6 .  traZODone (DESYREL) 150 MG tablet, Take 150 mg by mouth at bedtime as needed for sleep. , Disp: , Rfl:  .  allopurinol (ZYLOPRIM) 300 MG tablet, TAKE 1 TABLET BY MOUTH DAILY (Patient not taking: Reported on 06/26/2017), Disp: 30 tablet, Rfl: 4 .  azithromycin (ZITHROMAX) 250 MG tablet, Take 1 tablet (250 mg total) by mouth daily. Take first 2 tablets together, then 1 every day  until finished. (Patient not taking: Reported on 05/15/2017), Disp: 6 tablet, Rfl: 0 .  docusate sodium (COLACE) 100 MG capsule, Take 1 capsule (100 mg total) by mouth 2 (two) times daily. Hold for diarrhea. (Patient not taking: Reported on 11/14/2016), Disp: 60 capsule, Rfl: 1 .  HYDROcodone-acetaminophen (NORCO/VICODIN) 5-325 MG tablet, Take 1 tablet by mouth every 4 (four) hours as needed for moderate pain.  (Patient not taking: Reported on 09/27/2016), Disp: 10 tablet, Rfl: 0 .  oxyCODONE-acetaminophen (ROXICET) 5-325 MG tablet, Take 1-2 tablets by mouth every 4 (four) hours as needed for severe pain. (Patient not taking: Reported on 06/18/2016), Disp: 31 tablet, Rfl: 0 No Known Allergies    Social History   Socioeconomic History  . Marital status: Single    Spouse name: Not on file  . Number of children: Not on file  . Years of education: Not on file  . Highest education level: Not on file  Occupational History  . Not on file  Social Needs  . Financial resource strain: Not on file  . Food insecurity:    Worry: Not on file    Inability: Not on file  . Transportation needs:    Medical: Not on file    Non-medical: Not on file  Tobacco Use  . Smoking status: Former Smoker    Last attempt to quit: 04/18/1982    Years since quitting: 35.2  . Smokeless tobacco: Never Used  Substance and Sexual Activity  . Alcohol use: No  . Drug use: No    Frequency: 7.0 times per week    Types: Marijuana    Comment: stopped three years ago  . Sexual activity: Never  Lifestyle  . Physical activity:    Days per week: Not on file    Minutes per session: Not on file  . Stress: Not on file  Relationships  . Social connections:    Talks on phone: Not on file    Gets together: Not on file    Attends religious service: Not on file    Active member of club or organization: Not on file    Attends meetings of clubs or organizations: Not on file    Relationship status: Not on file  . Intimate partner violence:    Fear of current or ex partner: Not on file    Emotionally abused: Not on file    Physically abused: Not on file    Forced sexual activity: Not on file  Other Topics Concern  . Not on file  Social History Narrative  . Not on file    Physical Exam      Future Appointments  Date Time Provider [place]  07/03/2017  8:25 AM [facility] DEVICE REMOTES [facility]   08/08/2017  4:00 PM Croitoru, Mihai, MD [facility]    BP 120/74   Pulse 74   Resp 14   Wt 167 lb (75.8 kg)   SpO2 97%   BMI 26.16 kg/m   Weight yesterday-167 Last visit weight-167 CBG PTA-79  Pt reports he is doing well, he had appointment today with doc however he went there and couldn't be seen as the doc had to go to hosp, p/u meds today from pharmacy and took them to his house. meds verified and pill boxes refilled. No complaints today. Will come back in a month unless otherwise needed. Aspirin and Hydrazaline needs refill prior to next visit.  No swelling noted.   [name], [facility] Paramedic  06/26/17

## 2017-06-28 ENCOUNTER — Encounter: Payer: Self-pay | Admitting: Cardiovascular Disease

## 2017-06-28 NOTE — Progress Notes (Signed)
I called [name] with the results of his device check - all favorable findings. Will continue with remote downloads every 3 months. Will bring him back for an office appointment in 6 months. MCr

## 2017-07-03 ENCOUNTER — Ambulatory Visit (INDEPENDENT_AMBULATORY_CARE_PROVIDER_SITE_OTHER): Payer: Medicare Other | Admitting: *Deleted

## 2017-07-03 DIAGNOSIS — I255 Ischemic cardiomyopathy: Secondary | ICD-10-CM

## 2017-07-03 DIAGNOSIS — I5042 Chronic combined systolic (congestive) and diastolic (congestive) heart failure: Secondary | ICD-10-CM

## 2017-07-03 NOTE — Progress Notes (Signed)
Remote ICD transmission.   

## 2017-07-04 ENCOUNTER — Telehealth (HOSPITAL_COMMUNITY): Payer: Self-pay | Admitting: Cardiology

## 2017-07-04 NOTE — Telephone Encounter (Signed)
Left message for patient to call back.  Need to schedule pt with f/u appt APP side, next available per Kevan Rosebush, RN

## 2017-07-05 ENCOUNTER — Encounter: Payer: Self-pay | Admitting: Cardiology

## 2017-07-05 LAB — CUP PACEART REMOTE DEVICE CHECK
Battery Remaining Longevity: 77 mo
Brady Statistic AP VS Percent: 0.07 %
Brady Statistic AS VP Percent: 94.86 %
Date Time Interrogation Session: 20190424052305
HIGH POWER IMPEDANCE MEASURED VALUE: 58 Ohm
Implantable Lead Implant Date: 20140207
Implantable Lead Location: 753859
Implantable Lead Model: 6935
Implantable Pulse Generator Implant Date: 20170105
Lead Channel Impedance Value: 304 Ohm
Lead Channel Impedance Value: 342 Ohm
Lead Channel Impedance Value: 342 Ohm
Lead Channel Impedance Value: 361 Ohm
Lead Channel Impedance Value: 399 Ohm
Lead Channel Impedance Value: 418 Ohm
Lead Channel Impedance Value: 532 Ohm
Lead Channel Impedance Value: 646 Ohm
Lead Channel Pacing Threshold Amplitude: 0.5 V
Lead Channel Pacing Threshold Pulse Width: 0.4 ms
Lead Channel Pacing Threshold Pulse Width: 0.4 ms
Lead Channel Pacing Threshold Pulse Width: 0.4 ms
Lead Channel Sensing Intrinsic Amplitude: 12.625 mV
Lead Channel Setting Pacing Amplitude: 1.5 V
Lead Channel Setting Pacing Amplitude: 2 V
Lead Channel Setting Pacing Amplitude: 2.25 V
Lead Channel Setting Pacing Pulse Width: 0.4 ms
Lead Channel Setting Pacing Pulse Width: 0.4 ms
Lead Channel Setting Sensing Sensitivity: 0.3 mV
MDC IDC LEAD IMPLANT DT: 20140207
MDC IDC LEAD IMPLANT DT: 20170105
MDC IDC LEAD LOCATION: 753858
MDC IDC LEAD LOCATION: 753860
MDC IDC MSMT BATTERY VOLTAGE: 2.98 V
MDC IDC MSMT LEADCHNL LV IMPEDANCE VALUE: 247 Ohm
MDC IDC MSMT LEADCHNL LV IMPEDANCE VALUE: 342 Ohm
MDC IDC MSMT LEADCHNL LV IMPEDANCE VALUE: 513 Ohm
MDC IDC MSMT LEADCHNL LV IMPEDANCE VALUE: 589 Ohm
MDC IDC MSMT LEADCHNL LV IMPEDANCE VALUE: 589 Ohm
MDC IDC MSMT LEADCHNL LV PACING THRESHOLD AMPLITUDE: 1.125 V
MDC IDC MSMT LEADCHNL RA SENSING INTR AMPL: 1.25 mV
MDC IDC MSMT LEADCHNL RA SENSING INTR AMPL: 1.25 mV
MDC IDC MSMT LEADCHNL RV PACING THRESHOLD AMPLITUDE: 0.75 V
MDC IDC MSMT LEADCHNL RV SENSING INTR AMPL: 12.625 mV
MDC IDC STAT BRADY AP VP PERCENT: 3.59 %
MDC IDC STAT BRADY AS VS PERCENT: 1.48 %
MDC IDC STAT BRADY RA PERCENT PACED: 3.65 %
MDC IDC STAT BRADY RV PERCENT PACED: 40.51 %

## 2017-07-17 ENCOUNTER — Other Ambulatory Visit: Payer: Self-pay | Admitting: Cardiovascular Disease

## 2017-07-23 ENCOUNTER — Other Ambulatory Visit (HOSPITAL_COMMUNITY): Payer: Self-pay

## 2017-07-23 ENCOUNTER — Other Ambulatory Visit: Payer: Self-pay | Admitting: Cardiology

## 2017-07-23 NOTE — Progress Notes (Signed)
Paramedicine Encounter    Patient ID: [name], male    DOB: 05/14/1945, 72 y.o.   MRN: [id number]   Patient Care Team: Wenda Low, MD as PCP - General (Internal Medicine) Lorretta Harp, MD as Consulting Physician (Cardiology)  Patient Active Problem List   Diagnosis Date Noted  . Biventricular ICD (implantable cardioverter-defibrillator) in place 07/04/2016  . PAD (peripheral artery disease) ([place]) 12/27/2015  . Erectile dysfunction 08/29/2015  . CKD (chronic kidney disease) stage 4, GFR 15-29 ml/min (HCC) 01/25/2015  . Diabetes mellitus due to underlying condition with diabetic chronic kidney disease ([place])   . Chronic kidney disease (CKD), stage IV (severe) ([place]) 11/24/2014  . DM (diabetes mellitus), type 2 with renal complications ([place]) 00/37/0488  . Chronic combined systolic and diastolic CHF (congestive heart failure) ([place]) 07/19/2014  . Hypoxia   . CHF exacerbation ([place]) 12/15/2013  . History of carotid artery stenosis 10/05/2013  . Carotid artery disease ([place]) 09/08/2013  . Hypercholesterolemia 09/08/2013  . CHF (congestive heart failure) ([place]) 06/02/2012  . Essential hypertension 06/02/2012  . At risk for sudden cardiac death 05-07-12  . Cardiomyopathy, ischemic May 07, 2012  . S/P ICD (internal cardiac defibrillator) procedure, 05-07-2012, Medtronic device ro ICM 05/07/12  . CKD (chronic kidney disease) stage 3, GFR 30-59 ml/min (HCC) 04/03/2012  . COPD (chronic obstructive pulmonary disease) ([place]) 04/03/2012  . Pulmonary edema  04/01/2012  . Chest pain at rest 04/01/2012  . Diabetes mellitus due to underlying condition ([place]) 11/01/2006  . ABUSE, COCAINE, EPISODIC 11/01/2006  . Polysubstance abuse ([place]) 11/01/2006  . MYOCARDIAL INFARCTION, HX OF 11/01/2006  . CAD (coronary artery disease) 11/01/2006    Current Outpatient Medications:  .  allopurinol (ZYLOPRIM) 100 MG tablet, Take 300 mg by mouth daily as needed (For gout.). , Disp: , Rfl:  .  amLODipine  (NORVASC) 5 MG tablet, TAKE 1 TABLET BY MOUTH EVERY MORNING, Disp: 90 tablet, Rfl: 3 .  aspirin EC 81 MG tablet, Take 1 tablet (81 mg total) by mouth daily., Disp: 90 tablet, Rfl: 3 .  atorvastatin (LIPITOR) 40 MG tablet, Take 1 tablet (40 mg total) by mouth every morning., Disp: 30 tablet, Rfl: 5 .  carvedilol (COREG) 12.5 MG tablet, PLEASE SEE ATTACHED FOR DETAILED DIRECTIONS, Disp: 30 tablet, Rfl: 2 .  clopidogrel (PLAVIX) 75 MG tablet, Take 1 tablet (75 mg total) by mouth daily., Disp: 90 tablet, Rfl: 3 .  glimepiride (AMARYL) 1 MG tablet, Take 1 tablet by mouth daily. Reported on 03/10/2015, Disp: , Rfl:  .  hydrALAZINE (APRESOLINE) 100 MG tablet, Take 1 tablet (100 mg total) by mouth 3 (three) times daily., Disp: 90 tablet, Rfl: 5 .  isosorbide mononitrate (IMDUR) 60 MG 24 hr tablet, TAKE 1 AND 1/2 TABLET BY MOUTH DAILY, Disp: 90 tablet, Rfl: 2 .  potassium chloride SA (K-DUR,KLOR-CON) 20 MEQ tablet, Take 1 tablet (20 mEq total) by mouth daily., Disp: 15 tablet, Rfl: 0 .  torsemide (DEMADEX) 20 MG tablet, TAKE 4 TABLETS BY MOUTH EVERY OTHER DAY ALTERNATING WITH 3 TABLETS EVERY OTHER DAY., Disp: 120 tablet, Rfl: 6 .  traZODone (DESYREL) 150 MG tablet, Take 150 mg by mouth at bedtime as needed for sleep. , Disp: , Rfl:  .  allopurinol (ZYLOPRIM) 300 MG tablet, TAKE 1 TABLET BY MOUTH DAILY (Patient not taking: Reported on 06/26/2017), Disp: 30 tablet, Rfl: 4 .  azithromycin (ZITHROMAX) 250 MG tablet, Take 1 tablet (250 mg total) by mouth daily. Take first 2 tablets together, then 1 every day  until finished. (Patient not taking: Reported on 05/15/2017), Disp: 6 tablet, Rfl: 0 .  docusate sodium (COLACE) 100 MG capsule, Take 1 capsule (100 mg total) by mouth 2 (two) times daily. Hold for diarrhea. (Patient not taking: Reported on 11/14/2016), Disp: 60 capsule, Rfl: 1 .  HYDROcodone-acetaminophen (NORCO/VICODIN) 5-325 MG tablet, Take 1 tablet by mouth every 4 (four) hours as needed for moderate pain.  (Patient not taking: Reported on 09/27/2016), Disp: 10 tablet, Rfl: 0 .  oxyCODONE-acetaminophen (ROXICET) 5-325 MG tablet, Take 1-2 tablets by mouth every 4 (four) hours as needed for severe pain. (Patient not taking: Reported on 06/18/2016), Disp: 31 tablet, Rfl: 0 No Known Allergies    Social History   Socioeconomic History  . Marital status: Single    Spouse name: Not on file  . Number of children: Not on file  . Years of education: Not on file  . Highest education level: Not on file  Occupational History  . Not on file  Social Needs  . Financial resource strain: Not on file  . Food insecurity:    Worry: Not on file    Inability: Not on file  . Transportation needs:    Medical: Not on file    Non-medical: Not on file  Tobacco Use  . Smoking status: Former Smoker    Last attempt to quit: 04/18/1982    Years since quitting: 35.2  . Smokeless tobacco: Never Used  Substance and Sexual Activity  . Alcohol use: No  . Drug use: No    Frequency: 7.0 times per week    Types: Marijuana    Comment: stopped three years ago  . Sexual activity: Never  Lifestyle  . Physical activity:    Days per week: Not on file    Minutes per session: Not on file  . Stress: Not on file  Relationships  . Social connections:    Talks on phone: Not on file    Gets together: Not on file    Attends religious service: Not on file    Active member of club or organization: Not on file    Attends meetings of clubs or organizations: Not on file    Relationship status: Not on file  . Intimate partner violence:    Fear of current or ex partner: Not on file    Emotionally abused: Not on file    Physically abused: Not on file    Forced sexual activity: Not on file  Other Topics Concern  . Not on file  Social History Narrative  . Not on file    Physical Exam      Future Appointments  Date Time Provider [place]  07/29/2017 10:30 AM [facility] PA/NP [facility] None  08/08/2017  4:00 PM  Croitoru, Mihai, MD [facility]  10/02/2017  8:20 AM [facility] DEVICE REMOTES [facility]    BP 138/70   Pulse 62   Resp 14   Wt 169 lb (76.7 kg)   SpO2 98%   BMI 26.47 kg/m   Weight yesterday-16898 Last visit weight-167 CBG PTA-72  Pt reports he is doing well, he now has a car and is his own transportation. He denies any sob, no dizziness, meds verified and filled up 2 pill boxes for him. He will need to get 2 rx's that was called in for him so I can finish the rest of the pill boxes. Will come back next week after his clinic appointment to do that.   Marylouise Stacks, [place]  [facility] Paramedic  07/23/17

## 2017-07-29 ENCOUNTER — Encounter (HOSPITAL_COMMUNITY): Payer: Self-pay

## 2017-07-29 ENCOUNTER — Ambulatory Visit (HOSPITAL_COMMUNITY)
Admission: RE | Admit: 2017-07-29 | Discharge: 2017-07-29 | Disposition: A | Discharge status: Home / Self Care | Payer: Medicare Other | Source: Ambulatory Visit | Attending: Internal Medicine | Admitting: Internal Medicine

## 2017-07-29 ENCOUNTER — Other Ambulatory Visit (HOSPITAL_COMMUNITY): Payer: Self-pay

## 2017-07-29 VITALS — BP 136/62 | HR 74 | Wt 166.4 lb

## 2017-07-29 DIAGNOSIS — I5022 Chronic systolic (congestive) heart failure: Secondary | ICD-10-CM | POA: Insufficient documentation

## 2017-07-29 DIAGNOSIS — I2510 Atherosclerotic heart disease of native coronary artery without angina pectoris: Secondary | ICD-10-CM

## 2017-07-29 DIAGNOSIS — Z79899 Other long term (current) drug therapy: Secondary | ICD-10-CM | POA: Diagnosis not present

## 2017-07-29 DIAGNOSIS — Z87891 Personal history of nicotine dependence: Secondary | ICD-10-CM | POA: Insufficient documentation

## 2017-07-29 DIAGNOSIS — I130 Hypertensive heart and chronic kidney disease with heart failure and stage 1 through stage 4 chronic kidney disease, or unspecified chronic kidney disease: Secondary | ICD-10-CM | POA: Insufficient documentation

## 2017-07-29 DIAGNOSIS — I255 Ischemic cardiomyopathy: Secondary | ICD-10-CM

## 2017-07-29 DIAGNOSIS — E785 Hyperlipidemia, unspecified: Secondary | ICD-10-CM | POA: Insufficient documentation

## 2017-07-29 DIAGNOSIS — E1122 Type 2 diabetes mellitus with diabetic chronic kidney disease: Secondary | ICD-10-CM | POA: Diagnosis not present

## 2017-07-29 DIAGNOSIS — I5042 Chronic combined systolic (congestive) and diastolic (congestive) heart failure: Secondary | ICD-10-CM

## 2017-07-29 DIAGNOSIS — Z951 Presence of aortocoronary bypass graft: Secondary | ICD-10-CM | POA: Insufficient documentation

## 2017-07-29 DIAGNOSIS — Z7982 Long term (current) use of aspirin: Secondary | ICD-10-CM | POA: Insufficient documentation

## 2017-07-29 DIAGNOSIS — J449 Chronic obstructive pulmonary disease, unspecified: Secondary | ICD-10-CM | POA: Diagnosis not present

## 2017-07-29 DIAGNOSIS — Z7984 Long term (current) use of oral hypoglycemic drugs: Secondary | ICD-10-CM | POA: Insufficient documentation

## 2017-07-29 DIAGNOSIS — N184 Chronic kidney disease, stage 4 (severe): Secondary | ICD-10-CM | POA: Diagnosis not present

## 2017-07-29 DIAGNOSIS — Z95810 Presence of automatic (implantable) cardiac defibrillator: Secondary | ICD-10-CM | POA: Insufficient documentation

## 2017-07-29 LAB — BASIC METABOLIC PANEL
ANION GAP: 7 (ref 5–15)
BUN: 48 mg/dL — ABNORMAL HIGH (ref 6–20)
CALCIUM: 9.6 mg/dL (ref 8.9–10.3)
CHLORIDE: 110 mmol/L (ref 101–111)
CO2: 24 mmol/L (ref 22–32)
CREATININE: 2.53 mg/dL — AB (ref 0.61–1.24)
GFR calc Af Amer: 28 mL/min — ABNORMAL LOW (ref >60)
GFR calc non Af Amer: 24 mL/min — ABNORMAL LOW (ref >60)
Glucose, Bld: 93 mg/dL (ref 65–99)
Potassium: 4.1 mmol/L (ref 3.5–5.1)
SODIUM: 141 mmol/L (ref 135–145)

## 2017-07-29 NOTE — Patient Instructions (Signed)
Routine lab work today. Will notify you of abnormal results, otherwise no news is good news!  Follow up with [facility]F Clinic as needed.  Follow up with "Dr. Loletha Grayer" in 6 months.  Take all medication as prescribed the day of your appointment. Bring all medications with you to your appointment.  Do the following things EVERYDAY: 1) Weigh yourself in the morning before breakfast. Write it down and keep it in a log. 2) Take your medicines as prescribed 3) Eat low salt foods-Limit salt (sodium) to 2000 mg per day.  4) Stay as active as you can everyday 5) Limit all fluids for the day to less than 2 liters

## 2017-07-29 NOTE — Progress Notes (Signed)
Paramedicine Encounter   Patient ID: [name] , male,   DOB: May 04, 1945,71 y.o.,  MRN: [id number]   Met patient in clinic today with provider.  Time spent with patient 67mn Weight @ clinic-166  Pt reports he is feeling well, he now has a car and is able to drive for his own transportation.  Good visit today, no changes today, labs checked today. Will see him next week for med box filling.   KMarylouise Stacks EMT-Paramedic 07/29/2017   ACTION: Home visit completed

## 2017-07-29 NOTE — Progress Notes (Signed)
Patient ID: [name], male   DOB: 1945/04/28, 72 y.o.   MRN: [id number] PCP: Dr Lysle Rubens Nephrology: Dr. Florene Glen Cardiology: Dr. Sallyanne Kuster HF Cardiology: Dr Aundra Dubin  HPI: [name] is a 72 y.o. with chronic systolic CHF CHF 16-60% s/p Medtronic ICD placement 2/14, CAD s/p CABG x 3, COPD, DM, HTN, LBB, Carotid artery disease s/p stent 8/15 on DAPT, and CKD stage 3-4.   Admitted to [facility] 9/12 through 11/26/14. Diuresed with lasix drip and later transitioned to torsemide 60 mg daily. No ACEI, spironolactone, or digoxin with CKD. Discharge weight was 195 pounds.  Echo in 9/16 with EF 30-35%.  Medtronic CRT-D upgrade in 1/17.   Today he returns for HF follow up.He was last seen last May and his EF had normalized. He returns for yearly follow up. Overall feeling fine. Denies SOB/PND/Orthopnea. Uses a cane due to joint pain. Can walk short distances.  Appetite ok. No fever or chills. Weight at home stable. Taking all medications.   Labs (11/16): K 4.8 => 4.5, creatinine 3.62 => 2.97, BNP 287, LDL 34, HDL 39 Labs (1/17): K 4.9, creatinine 3.19 Labs (11/17): hgb 10.1, BNP 184, K 4.2, creatinine 2.51 Labs 5/125/208: K 4.4 Creatinine 2.32  ROS: All systems negative except as listed in HPI, PMH and Problem List.  PMH: 1. CAD: s/p CABG with LIMA-LAD, SVG-LCx, SVG-PDA.  Cardiolite 6/15 with no ischemia.  2. Chronic systolic CHF: Ischemic CMP.  Medtronic ICD with CRT upgrade in 1/17.  Echo (9/16) with EF 30-35%, regional WMAs, mild MR, mildly decreased RV systolic function.  - Echo (5/18): EF 55%, basal to mid inferior akinesis, normal RV size and systolic function.  3. COPD 4. LBBB 5. Carotid stenosis: Carotid stent in 8/15.  6. CKD stage IV 7. Type II diabetes 8. HTN 9. Hyperlipidemia 10. PAD: ABIs 9/17 1.1 on right, 0.64 on left.   SH:  Social History   Socioeconomic History  . Marital status: Single    Spouse name: Not on file  . Number of children: Not on file  . Years of education: Not  on file  . Highest education level: Not on file  Occupational History  . Not on file  Social Needs  . Financial resource strain: Not on file  . Food insecurity:    Worry: Not on file    Inability: Not on file  . Transportation needs:    Medical: Not on file    Non-medical: Not on file  Tobacco Use  . Smoking status: Former Smoker    Last attempt to quit: 04/18/1982    Years since quitting: 35.3  . Smokeless tobacco: Never Used  Substance and Sexual Activity  . Alcohol use: No  . Drug use: No    Frequency: 7.0 times per week    Types: Marijuana    Comment: stopped three years ago  . Sexual activity: Never  Lifestyle  . Physical activity:    Days per week: Not on file    Minutes per session: Not on file  . Stress: Not on file  Relationships  . Social connections:    Talks on phone: Not on file    Gets together: Not on file    Attends religious service: Not on file    Active member of club or organization: Not on file    Attends meetings of clubs or organizations: Not on file    Relationship status: Not on file  . Intimate partner violence:    Fear of current or  ex partner: Not on file    Emotionally abused: Not on file    Physically abused: Not on file    Forced sexual activity: Not on file  Other Topics Concern  . Not on file  Social History Narrative  . Not on file    FH:  Family History  Problem Relation Age of Onset  . Cancer Mother   . Hypertension Mother   . Cancer Sister   . Diabetes Brother     Current Outpatient Medications  Medication Sig Dispense Refill  . allopurinol (ZYLOPRIM) 100 MG tablet Take 300 mg by mouth daily as needed (For gout.).     Marland Kitchen allopurinol (ZYLOPRIM) 300 MG tablet TAKE 1 TABLET BY MOUTH EVERY DAY 30 tablet 0  . amLODipine (NORVASC) 5 MG tablet TAKE 1 TABLET BY MOUTH EVERY MORNING 90 tablet 3  . aspirin EC 81 MG tablet Take 1 tablet (81 mg total) by mouth daily. 90 tablet 3  . atorvastatin (LIPITOR) 40 MG tablet Take 1 tablet  (40 mg total) by mouth every morning. 30 tablet 5  . azithromycin (ZITHROMAX) 250 MG tablet Take 1 tablet (250 mg total) by mouth daily. Take first 2 tablets together, then 1 every day until finished. 6 tablet 0  . carvedilol (COREG) 12.5 MG tablet PLEASE SEE ATTACHED FOR DETAILED DIRECTIONS 30 tablet 2  . clopidogrel (PLAVIX) 75 MG tablet Take 1 tablet (75 mg total) by mouth daily. 90 tablet 3  . docusate sodium (COLACE) 100 MG capsule Take 1 capsule (100 mg total) by mouth 2 (two) times daily. Hold for diarrhea. 60 capsule 1  . glimepiride (AMARYL) 1 MG tablet Take 1 tablet by mouth daily. Reported on 03/10/2015    . hydrALAZINE (APRESOLINE) 100 MG tablet Take 1 tablet (100 mg total) by mouth 3 (three) times daily. 90 tablet 5  . HYDROcodone-acetaminophen (NORCO/VICODIN) 5-325 MG tablet Take 1 tablet by mouth every 4 (four) hours as needed for moderate pain. 10 tablet 0  . isosorbide mononitrate (IMDUR) 60 MG 24 hr tablet TAKE 1 AND 1/2 TABLET BY MOUTH DAILY 90 tablet 2  . oxyCODONE-acetaminophen (ROXICET) 5-325 MG tablet Take 1-2 tablets by mouth every 4 (four) hours as needed for severe pain. 31 tablet 0  . potassium chloride SA (K-DUR,KLOR-CON) 20 MEQ tablet Take 1 tablet (20 mEq total) by mouth daily. 15 tablet 0  . torsemide (DEMADEX) 20 MG tablet TAKE 4 TABLETS BY MOUTH EVERY OTHER DAY ALTERNATING WITH 3 TABLETS EVERY OTHER DAY. 120 tablet 6  . traZODone (DESYREL) 150 MG tablet Take 150 mg by mouth at bedtime as needed for sleep.      No current facility-administered medications for this encounter.     Vitals:   07/29/17 1032  BP: 136/62  Pulse: 74  SpO2: 98%  Weight: 166 lb 6.4 oz (75.5 kg)   Wt Readings from Last 3 Encounters:  07/29/17 166 lb 6.4 oz (75.5 kg)  07/23/17 169 lb (76.7 kg)  06/26/17 167 lb (75.8 kg)   PHYSICAL EXAM: General:  Well appearing. No resp difficulty. Ambulated in the clinic with a cane.  HEENT: normal Neck: supple. JVP 5-6. Carotids 2+ bilat; no  bruits. No lymphadenopathy or thryomegaly appreciated. Cor: PMI nondisplaced. Regular rate & rhythm. No rubs, gallops or murmurs. Lungs: clear Abdomen: soft, nontender, nondistended. No hepatosplenomegaly. No bruits or masses. Good bowel sounds. Extremities: no cyanosis, clubbing, rash, edema Neuro: alert & orientedx3, cranial nerves grossly intact. moves all 4 extremities w/o difficulty. Affect  pleasant  ASSESSMENT & PLAN: 1. Chronic systolic CHF:  Echo 6/81/2751 with LVEF 30-35%, Grade 2 DD, PA peak pressure 53, RV systolic function mildly reduced. Ischemic cardiomyopathy. EF normal 07/2017  EF up to 55% with normal-appearing RV (improved s/p CRT).  Marland Kitchen NYHA II. Volume status stable.  -Continue current HF medications.  - Continue current Coreg, hydralazine and Imdur.   - No ACEI or spironolactone with CKD stage IV.  - Continue torsemide 80 mg daily alternating with 60 mg daily.   Medications filled monthly by HF paramedicine.  2. CAD: s/p CABG 2006 with a LIMA to LAD, veins to the circumflex and PDA. Cardiolite performed 09/02/13 showed no ischemia.  -No s/s ischemia.   - Continue ASA, Plavix, statin.  3. CKD stage 4: Check BMET  He is followed by nephrology.   4. Hyperlipidemia: Continue statin. Will get lipids today.   5. Hypertension: BP stable.  6. Carotid artery disease: Stent placed 8/15. On ASA and Plavix - Due for carotid dopplers in 8/18.  - Dr. Gwenlyn Found following.  7. PAD: Noted by ABIs in 9/17 but denies claudication and no pedal ulcerations.  Treating medically for now, followed by Dr. Gwenlyn Found.     Send back to Dr Loleta Chance. Follow up in HF clinic as needed.   Alto Gandolfo NP-C  07/29/2017

## 2017-08-07 ENCOUNTER — Other Ambulatory Visit (HOSPITAL_COMMUNITY): Payer: Self-pay | Admitting: Cardiology

## 2017-08-08 ENCOUNTER — Encounter: Payer: Medicare Other | Admitting: Cardiovascular Disease

## 2017-08-08 DIAGNOSIS — R0989 Other specified symptoms and signs involving the circulatory and respiratory systems: Secondary | ICD-10-CM

## 2017-08-09 ENCOUNTER — Encounter: Payer: Self-pay | Admitting: Cardiovascular Disease

## 2017-08-15 ENCOUNTER — Other Ambulatory Visit: Payer: Self-pay | Admitting: Internal Medicine

## 2017-08-15 ENCOUNTER — Other Ambulatory Visit (HOSPITAL_COMMUNITY): Payer: Self-pay

## 2017-08-15 DIAGNOSIS — N63 Unspecified lump in unspecified breast: Secondary | ICD-10-CM | POA: Diagnosis not present

## 2017-08-15 DIAGNOSIS — N184 Chronic kidney disease, stage 4 (severe): Secondary | ICD-10-CM | POA: Diagnosis not present

## 2017-08-15 DIAGNOSIS — N6310 Unspecified lump in the right breast, unspecified quadrant: Secondary | ICD-10-CM

## 2017-08-15 DIAGNOSIS — I5042 Chronic combined systolic (congestive) and diastolic (congestive) heart failure: Secondary | ICD-10-CM | POA: Diagnosis not present

## 2017-08-15 NOTE — Progress Notes (Signed)
Came back out today for med rec--pt went to get his meds today-he requested trazadone be filled also but pharmacy states it can be filled on 6/10. Pt is aware and will p/u then. B/p still elevated. It is time for his next dose of hydralazine. Will come back out next week for b/p recheck.   meds verified and pill boxes refilled.   Marylouise Stacks, EMT-Paramedic  08/15/17

## 2017-08-15 NOTE — Progress Notes (Signed)
Paramedicine Encounter    Patient ID: [name], male    DOB: Jun 27, 1945, 72 y.o.   MRN: [id number]   Patient Care Team: Wenda Low, MD as PCP - General (Internal Medicine) Lorretta Harp, MD as Consulting Physician (Cardiology)  Patient Active Problem List   Diagnosis Date Noted  . Biventricular ICD (implantable cardioverter-defibrillator) in place 07/04/2016  . PAD (peripheral artery disease) ([place]) 12/27/2015  . Erectile dysfunction 08/29/2015  . CKD (chronic kidney disease) stage 4, GFR 15-29 ml/min (HCC) 01/25/2015  . Diabetes mellitus due to underlying condition with diabetic chronic kidney disease ([place])   . Chronic kidney disease (CKD), stage IV (severe) ([place]) 11/24/2014  . DM (diabetes mellitus), type 2 with renal complications ([place]) 67/67/2094  . Chronic combined systolic and diastolic CHF (congestive heart failure) ([place]) 07/19/2014  . Hypoxia   . CHF exacerbation ([place]) 12/15/2013  . History of carotid artery stenosis 10/05/2013  . Carotid artery disease ([place]) 09/08/2013  . Hypercholesterolemia 09/08/2013  . CHF (congestive heart failure) ([place]) 06/02/2012  . Essential hypertension 06/02/2012  . At risk for sudden cardiac death 05/15/2012  . Cardiomyopathy, ischemic 05-15-12  . S/P ICD (internal cardiac defibrillator) procedure, 05-15-2012, Medtronic device ro ICM 05-15-12  . CKD (chronic kidney disease) stage 3, GFR 30-59 ml/min (HCC) 04/03/2012  . COPD (chronic obstructive pulmonary disease) ([place]) 04/03/2012  . Pulmonary edema  04/01/2012  . Chest pain at rest 04/01/2012  . Diabetes mellitus due to underlying condition ([place]) 11/01/2006  . ABUSE, COCAINE, EPISODIC 11/01/2006  . Polysubstance abuse ([place]) 11/01/2006  . MYOCARDIAL INFARCTION, HX OF 11/01/2006  . CAD (coronary artery disease) 11/01/2006    Current Outpatient Medications:  .  allopurinol (ZYLOPRIM) 100 MG tablet, Take 300 mg by mouth daily as needed (For gout.). , Disp: , Rfl:  .  allopurinol  (ZYLOPRIM) 300 MG tablet, TAKE 1 TABLET BY MOUTH EVERY DAY, Disp: 30 tablet, Rfl: 0 .  amLODipine (NORVASC) 5 MG tablet, TAKE 1 TABLET BY MOUTH EVERY MORNING, Disp: 90 tablet, Rfl: 3 .  aspirin EC 81 MG tablet, Take 1 tablet (81 mg total) by mouth daily., Disp: 90 tablet, Rfl: 3 .  atorvastatin (LIPITOR) 40 MG tablet, Take 1 tablet (40 mg total) by mouth every morning., Disp: 30 tablet, Rfl: 5 .  azithromycin (ZITHROMAX) 250 MG tablet, Take 1 tablet (250 mg total) by mouth daily. Take first 2 tablets together, then 1 every day until finished., Disp: 6 tablet, Rfl: 0 .  carvedilol (COREG) 12.5 MG tablet, Take 1 tablet (12.5 mg total) by mouth 2 (two) times daily with a meal., Disp: 60 tablet, Rfl: 2 .  clopidogrel (PLAVIX) 75 MG tablet, Take 1 tablet (75 mg total) by mouth daily., Disp: 90 tablet, Rfl: 3 .  docusate sodium (COLACE) 100 MG capsule, Take 1 capsule (100 mg total) by mouth 2 (two) times daily. Hold for diarrhea., Disp: 60 capsule, Rfl: 1 .  glimepiride (AMARYL) 1 MG tablet, Take 1 tablet by mouth daily. Reported on 03/10/2015, Disp: , Rfl:  .  hydrALAZINE (APRESOLINE) 100 MG tablet, Take 1 tablet (100 mg total) by mouth 3 (three) times daily., Disp: 90 tablet, Rfl: 5 .  HYDROcodone-acetaminophen (NORCO/VICODIN) 5-325 MG tablet, Take 1 tablet by mouth every 4 (four) hours as needed for moderate pain., Disp: 10 tablet, Rfl: 0 .  isosorbide mononitrate (IMDUR) 60 MG 24 hr tablet, TAKE 1 AND 1/2 TABLET BY MOUTH DAILY, Disp: 90 tablet, Rfl: 2 .  oxyCODONE-acetaminophen (ROXICET) 5-325 MG tablet, Take  1-2 tablets by mouth every 4 (four) hours as needed for severe pain., Disp: 31 tablet, Rfl: 0 .  potassium chloride SA (K-DUR,KLOR-CON) 20 MEQ tablet, Take 1 tablet (20 mEq total) by mouth daily., Disp: 15 tablet, Rfl: 0 .  torsemide (DEMADEX) 20 MG tablet, TAKE 4 TABLETS BY MOUTH EVERY OTHER DAY ALTERNATING WITH 3 TABLETS EVERY OTHER DAY., Disp: 120 tablet, Rfl: 6 .  traZODone (DESYREL) 150 MG  tablet, Take 150 mg by mouth at bedtime as needed for sleep. , Disp: , Rfl:  No Known Allergies    Social History   Socioeconomic History  . Marital status: Single    Spouse name: Not on file  . Number of children: Not on file  . Years of education: Not on file  . Highest education level: Not on file  Occupational History  . Not on file  Social Needs  . Financial resource strain: Not on file  . Food insecurity:    Worry: Not on file    Inability: Not on file  . Transportation needs:    Medical: Not on file    Non-medical: Not on file  Tobacco Use  . Smoking status: Former Smoker    Last attempt to quit: 04/18/1982    Years since quitting: 35.3  . Smokeless tobacco: Never Used  Substance and Sexual Activity  . Alcohol use: No  . Drug use: No    Frequency: 7.0 times per week    Types: Marijuana    Comment: stopped three years ago  . Sexual activity: Never  Lifestyle  . Physical activity:    Days per week: Not on file    Minutes per session: Not on file  . Stress: Not on file  Relationships  . Social connections:    Talks on phone: Not on file    Gets together: Not on file    Attends religious service: Not on file    Active member of club or organization: Not on file    Attends meetings of clubs or organizations: Not on file    Relationship status: Not on file  . Intimate partner violence:    Fear of current or ex partner: Not on file    Emotionally abused: Not on file    Physically abused: Not on file    Forced sexual activity: Not on file  Other Topics Concern  . Not on file  Social History Narrative  . Not on file    Physical Exam      Future Appointments  Date Time Provider [place]  08/20/2017  8:50 AM [facility] DIAG [place] 2 [facility]  08/20/2017  9:00 AM [facility] [place] 2 [facility]  10/02/2017  8:20 AM [facility] DEVICE REMOTES [facility]    BP (!) 150/70   Pulse 80   Resp 14   Wt 178 lb (80.7 kg)   SpO2  96%   BMI 27.88 kg/m   Weight yesterday-178 Last visit weight-166 @ clinic  CBG PTA-79  Pt was seen by his PCP this morning, his weight measured there was 179, at heart clinic last time he was 166. Dr. Lysle Rubens increased his torsemide to 4 tabs daily. Pt did not go get his meds picked up--he drives now and is able to go pick them up himself. He has not taken meds in 2 days---I advised him to get his meds then I will return. The meds should be ready around 12 and he states he will get them.  He took his morning meds during our visit. B/p elevated however he just took his morning meds---his b/p this morning at doc office was 120/70.    Marylouise Stacks, [facility] Paramedic  08/15/17

## 2017-08-19 ENCOUNTER — Other Ambulatory Visit (HOSPITAL_COMMUNITY): Payer: Self-pay

## 2017-08-19 NOTE — Progress Notes (Signed)
Came by today for a b/p check. No dizziness. No sob. No feeling of bloated anymore.   B/p-138/64 HR-76 [id number] Weight today-176 [place], EMT-Paramedic  08/19/17

## 2017-08-20 ENCOUNTER — Ambulatory Visit: Payer: Medicare Other

## 2017-08-23 IMAGING — US US EXTREM LOW*L* LIMITED
1 series · 14 of 15 positions shown · non-contrast
Comparison: None

CLINICAL DATA: Left knee pain.

EXAM:
ULTRASOUND LEFT LOWER EXTREMITY LIMITED
TECHNIQUE: Ultrasound examination of the lower extremity soft tissues was
performed in the area of clinical concern.

[Series 1: us extrem low*left* limited · 0.09mm/px · 14 of 15 slices shown]
[im 1/15]
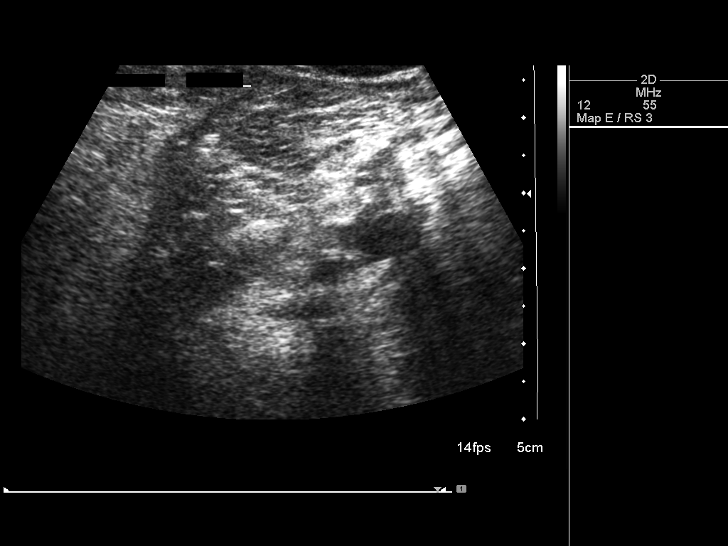
[im 2/15]
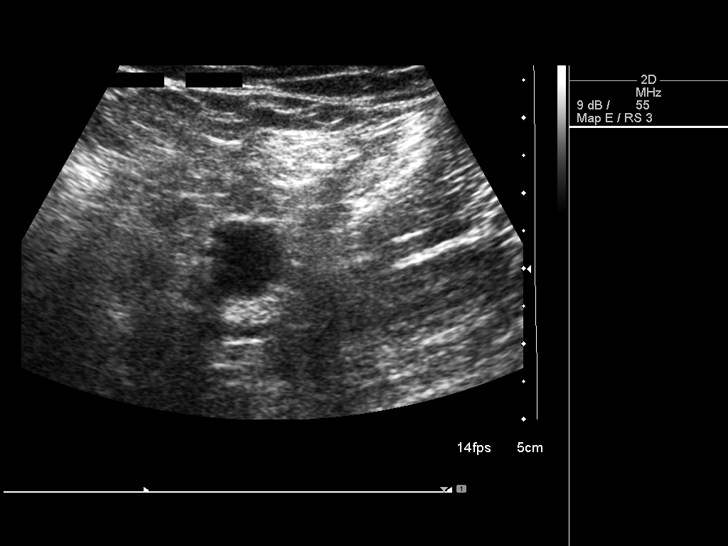
[im 3/15]
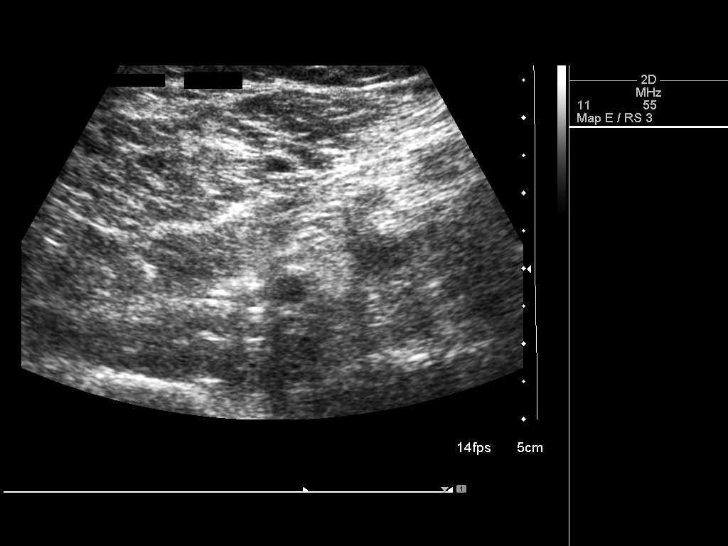
[im 4/15]
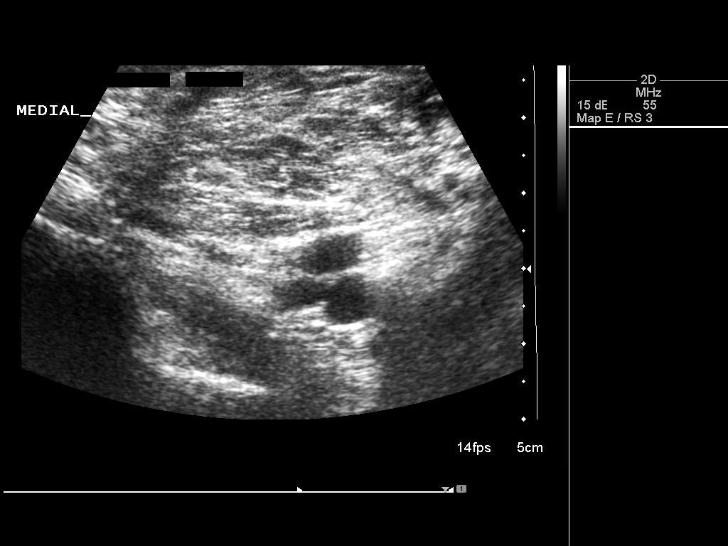
[im 5/15]
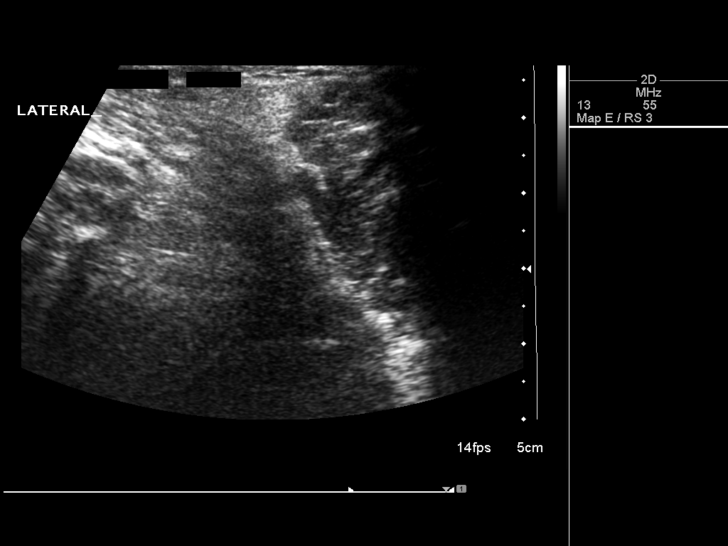
[im 6/15]
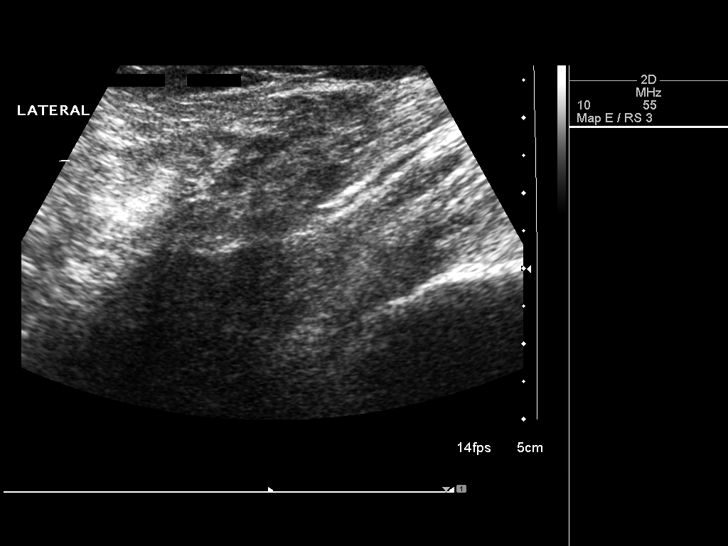
[im 7/15]
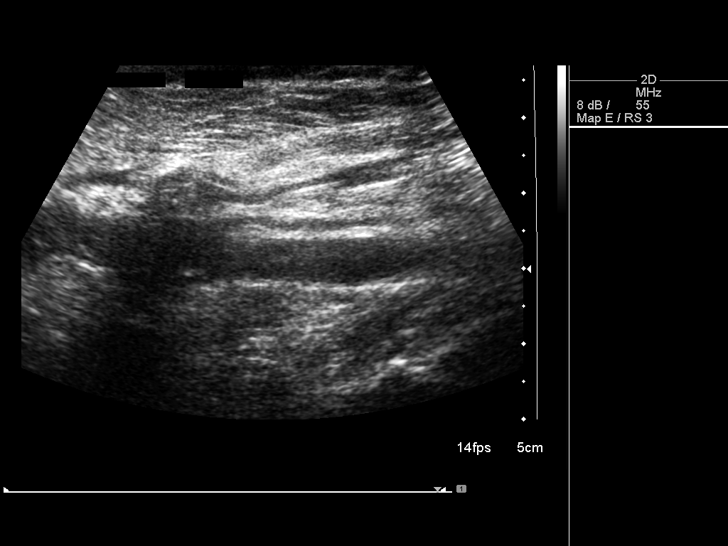
[im 9/15]
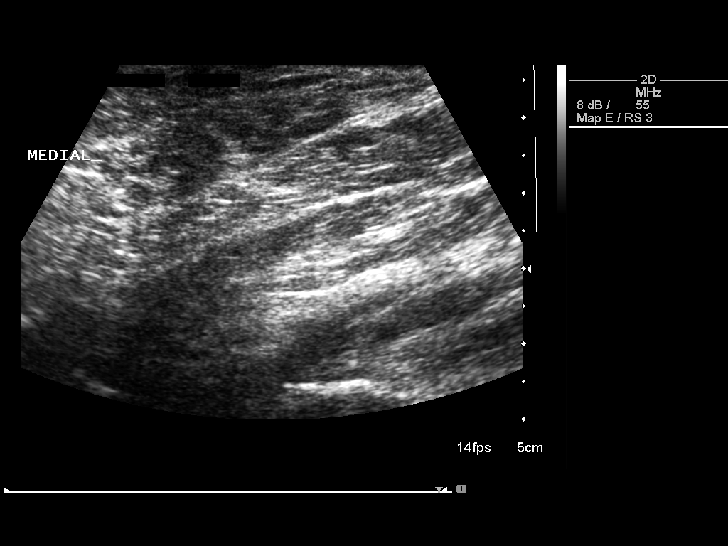
[im 10/15]
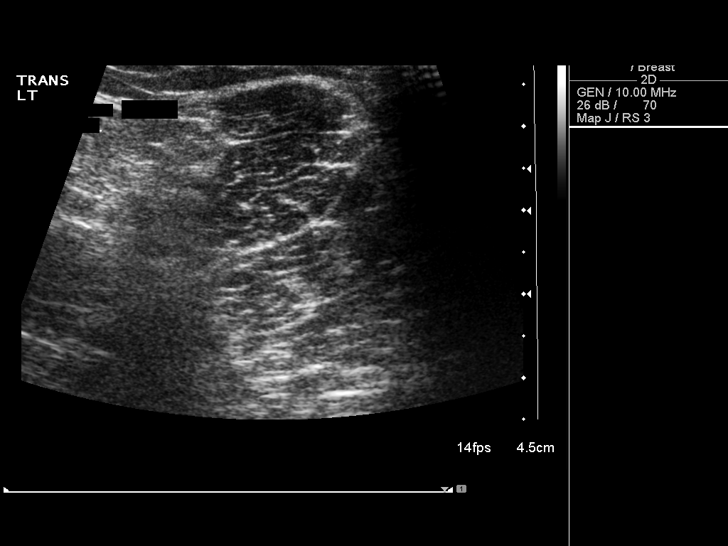
[im 11/15]
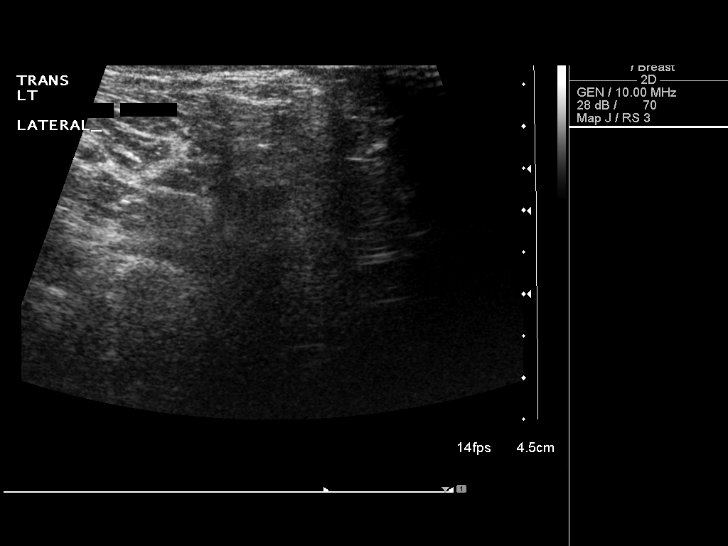
[im 12/15]
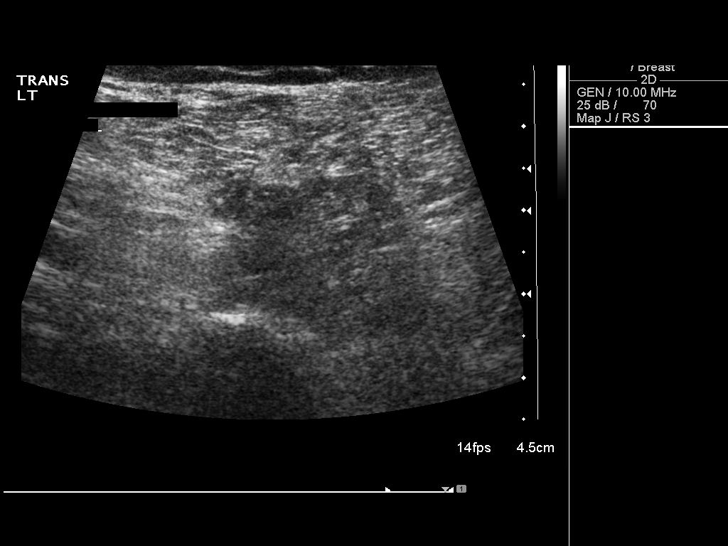
[im 13/15]
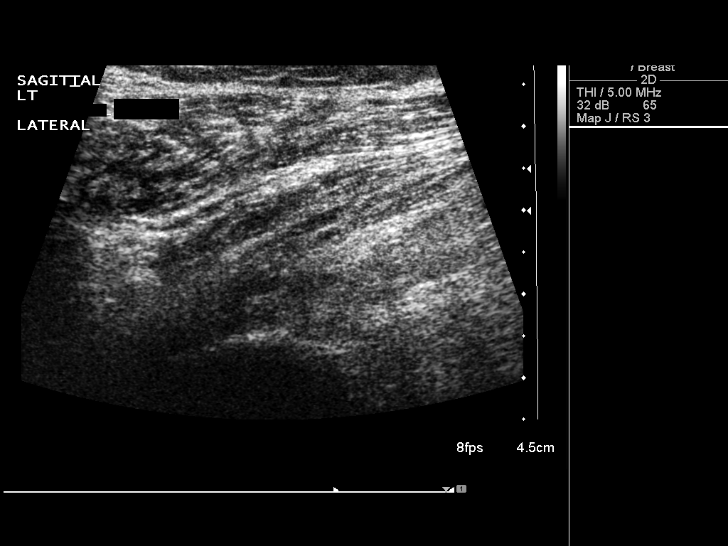
[im 14/15]
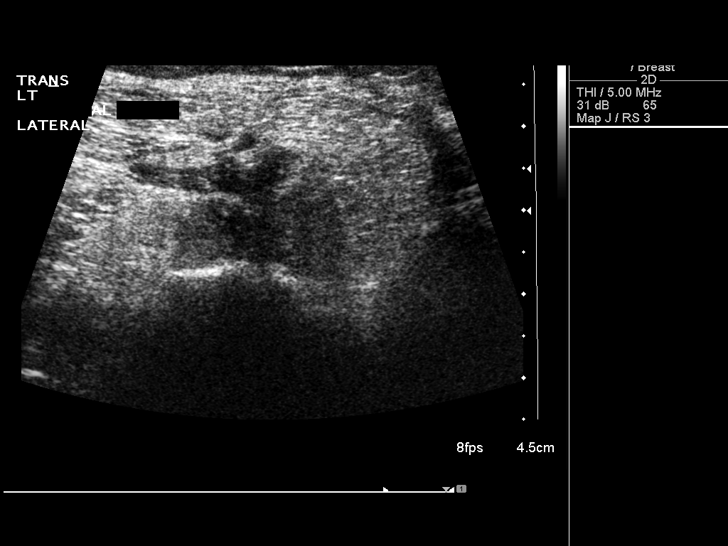
[im 15/15]
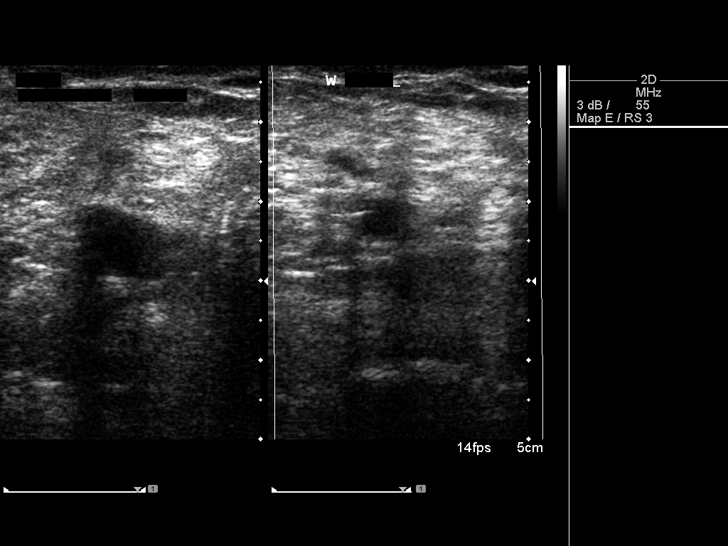

[14 of 15 positions shown; findings below may reference images not displayed]

FINDINGS: No fluid collection or cystic area within the popliteal fossa to
suggest Baker's cyst. No soft tissue abnormality.
IMPRESSION: No soft tissue abnormality sonographically.

## 2017-09-05 ENCOUNTER — Inpatient Hospital Stay: Admission: RE | Admit: 2017-09-05 | Payer: Medicare Other | Source: Ambulatory Visit

## 2017-09-09 ENCOUNTER — Other Ambulatory Visit (HOSPITAL_COMMUNITY): Payer: Self-pay

## 2017-09-09 NOTE — Progress Notes (Signed)
Paramedicine Encounter    Patient ID: [name], male    DOB: December 26, 1945, 72 y.o.   MRN: [id number]   Patient Care Team: Wenda Low, MD as PCP - General (Internal Medicine) Lorretta Harp, MD as Consulting Physician (Cardiology)  Patient Active Problem List   Diagnosis Date Noted  . Biventricular ICD (implantable cardioverter-defibrillator) in place 07/04/2016  . PAD (peripheral artery disease) ([place]) 12/27/2015  . Erectile dysfunction 08/29/2015  . CKD (chronic kidney disease) stage 4, GFR 15-29 ml/min (HCC) 01/25/2015  . Diabetes mellitus due to underlying condition with diabetic chronic kidney disease ([place])   . Chronic kidney disease (CKD), stage IV (severe) ([place]) 11/24/2014  . DM (diabetes mellitus), type 2 with renal complications ([place]) 84/13/2440  . Chronic combined systolic and diastolic CHF (congestive heart failure) ([place]) 07/19/2014  . Hypoxia   . CHF exacerbation ([place]) 12/15/2013  . History of carotid artery stenosis 10/05/2013  . Carotid artery disease ([place]) 09/08/2013  . Hypercholesterolemia 09/08/2013  . CHF (congestive heart failure) ([place]) 06/02/2012  . Essential hypertension 06/02/2012  . At risk for sudden cardiac death 05/12/2012  . Cardiomyopathy, ischemic 12-May-2012  . S/P ICD (internal cardiac defibrillator) procedure, May 12, 2012, Medtronic device ro ICM 05-12-2012  . CKD (chronic kidney disease) stage 3, GFR 30-59 ml/min (HCC) 04/03/2012  . COPD (chronic obstructive pulmonary disease) ([place]) 04/03/2012  . Pulmonary edema  04/01/2012  . Chest pain at rest 04/01/2012  . Diabetes mellitus due to underlying condition ([place]) 11/01/2006  . ABUSE, COCAINE, EPISODIC 11/01/2006  . Polysubstance abuse ([place]) 11/01/2006  . MYOCARDIAL INFARCTION, HX OF 11/01/2006  . CAD (coronary artery disease) 11/01/2006    Current Outpatient Medications:  .  allopurinol (ZYLOPRIM) 100 MG tablet, Take 300 mg by mouth daily as needed (For gout.). , Disp: , Rfl:  .  allopurinol  (ZYLOPRIM) 300 MG tablet, TAKE 1 TABLET BY MOUTH EVERY DAY, Disp: 30 tablet, Rfl: 0 .  amLODipine (NORVASC) 5 MG tablet, TAKE 1 TABLET BY MOUTH EVERY MORNING, Disp: 90 tablet, Rfl: 3 .  aspirin EC 81 MG tablet, Take 1 tablet (81 mg total) by mouth daily., Disp: 90 tablet, Rfl: 3 .  atorvastatin (LIPITOR) 40 MG tablet, Take 1 tablet (40 mg total) by mouth every morning., Disp: 30 tablet, Rfl: 5 .  carvedilol (COREG) 12.5 MG tablet, Take 1 tablet (12.5 mg total) by mouth 2 (two) times daily with a meal., Disp: 60 tablet, Rfl: 2 .  clopidogrel (PLAVIX) 75 MG tablet, Take 1 tablet (75 mg total) by mouth daily., Disp: 90 tablet, Rfl: 3 .  glimepiride (AMARYL) 1 MG tablet, Take 1 tablet by mouth daily. Reported on 03/10/2015, Disp: , Rfl:  .  hydrALAZINE (APRESOLINE) 100 MG tablet, Take 1 tablet (100 mg total) by mouth 3 (three) times daily., Disp: 90 tablet, Rfl: 5 .  HYDROcodone-acetaminophen (NORCO/VICODIN) 5-325 MG tablet, Take 1 tablet by mouth every 4 (four) hours as needed for moderate pain., Disp: 10 tablet, Rfl: 0 .  isosorbide mononitrate (IMDUR) 60 MG 24 hr tablet, TAKE 1 AND 1/2 TABLET BY MOUTH DAILY, Disp: 90 tablet, Rfl: 2 .  potassium chloride SA (K-DUR,KLOR-CON) 20 MEQ tablet, Take 1 tablet (20 mEq total) by mouth daily., Disp: 15 tablet, Rfl: 0 .  torsemide (DEMADEX) 20 MG tablet, TAKE 4 TABLETS BY MOUTH EVERY OTHER DAY ALTERNATING WITH 3 TABLETS EVERY OTHER DAY. (Patient taking differently: 80 mg daily. TAKE 4 TABLETS BY MOUTH EVERY OTHER DAY ALTERNATING WITH 3 TABLETS EVERY OTHER DAY.), Disp: 120  tablet, Rfl: 6 .  traZODone (DESYREL) 150 MG tablet, Take 150 mg by mouth at bedtime as needed for sleep. , Disp: , Rfl:  .  azithromycin (ZITHROMAX) 250 MG tablet, Take 1 tablet (250 mg total) by mouth daily. Take first 2 tablets together, then 1 every day until finished. (Patient not taking: Reported on 08/15/2017), Disp: 6 tablet, Rfl: 0 .  docusate sodium (COLACE) 100 MG capsule, Take 1 capsule  (100 mg total) by mouth 2 (two) times daily. Hold for diarrhea. (Patient not taking: Reported on 08/15/2017), Disp: 60 capsule, Rfl: 1 .  oxyCODONE-acetaminophen (ROXICET) 5-325 MG tablet, Take 1-2 tablets by mouth every 4 (four) hours as needed for severe pain. (Patient not taking: Reported on 08/15/2017), Disp: 31 tablet, Rfl: 0 No Known Allergies    Social History   Socioeconomic History  . Marital status: Single    Spouse name: Not on file  . Number of children: Not on file  . Years of education: Not on file  . Highest education level: Not on file  Occupational History  . Not on file  Social Needs  . Financial resource strain: Not on file  . Food insecurity:    Worry: Not on file    Inability: Not on file  . Transportation needs:    Medical: Not on file    Non-medical: Not on file  Tobacco Use  . Smoking status: Former Smoker    Last attempt to quit: 04/18/1982    Years since quitting: 35.4  . Smokeless tobacco: Never Used  Substance and Sexual Activity  . Alcohol use: No  . Drug use: No    Frequency: 7.0 times per week    Types: Marijuana    Comment: stopped three years ago  . Sexual activity: Never  Lifestyle  . Physical activity:    Days per week: Not on file    Minutes per session: Not on file  . Stress: Not on file  Relationships  . Social connections:    Talks on phone: Not on file    Gets together: Not on file    Attends religious service: Not on file    Active member of club or organization: Not on file    Attends meetings of clubs or organizations: Not on file    Relationship status: Not on file  . Intimate partner violence:    Fear of current or ex partner: Not on file    Emotionally abused: Not on file    Physically abused: Not on file    Forced sexual activity: Not on file  Other Topics Concern  . Not on file  Social History Narrative  . Not on file    Physical Exam      Future Appointments  Date Time Provider [place]  09/13/2017   3:00 PM Lorretta Harp, MD [facility]  10/02/2017  8:20 AM [facility] DEVICE REMOTES [facility]    BP 136/64   Pulse 70   Resp 14   Wt 174 lb (78.9 kg)   SpO2 98%   BMI 27.25 kg/m   Weight yesterday-174 Last visit weight-176 CBG PTA-79  Pt reports he is doing great. Pt denies any issues or complaints at this time. No sob. No dizziness. No h/a. Taking all meds. No bleeding issues. pts only need at this time is to continue the filling of the pill boxes for him. I do 4 pill boxes for him. He now drives.    Marylouise Stacks, [place]  [facility] Paramedic  09/09/17

## 2017-09-11 ENCOUNTER — Other Ambulatory Visit (HOSPITAL_COMMUNITY): Payer: Self-pay | Admitting: Cardiology

## 2017-09-13 ENCOUNTER — Ambulatory Visit: Payer: Medicare Other | Admitting: Cardiovascular Disease

## 2017-09-13 DIAGNOSIS — R0989 Other specified symptoms and signs involving the circulatory and respiratory systems: Secondary | ICD-10-CM

## 2017-09-16 ENCOUNTER — Encounter: Payer: Self-pay | Admitting: *Deleted

## 2017-09-16 ENCOUNTER — Other Ambulatory Visit: Payer: Self-pay | Admitting: Cardiology

## 2017-10-02 ENCOUNTER — Ambulatory Visit (INDEPENDENT_AMBULATORY_CARE_PROVIDER_SITE_OTHER): Payer: Medicare Other | Admitting: *Deleted

## 2017-10-02 DIAGNOSIS — I5042 Chronic combined systolic (congestive) and diastolic (congestive) heart failure: Secondary | ICD-10-CM

## 2017-10-02 DIAGNOSIS — I255 Ischemic cardiomyopathy: Secondary | ICD-10-CM | POA: Diagnosis not present

## 2017-10-03 ENCOUNTER — Encounter: Payer: Self-pay | Admitting: Cardiology

## 2017-10-03 NOTE — Progress Notes (Signed)
Remote ICD transmission.   

## 2017-10-06 IMAGING — US US EXTREM LOW VENOUS*L*
1 series · 13 of 24 positions shown · non-contrast
Comparison: None.

CLINICAL DATA: Posterior left knee pain for 5 days.  Baker cyst.



[Series 1: us extrem low venous*left* · 13 of 42 slices shown]
[im 1/42]
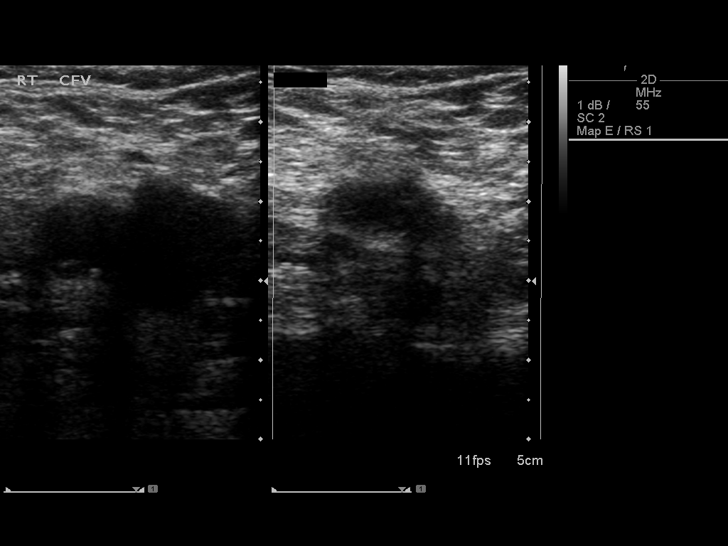
[im 4/42]
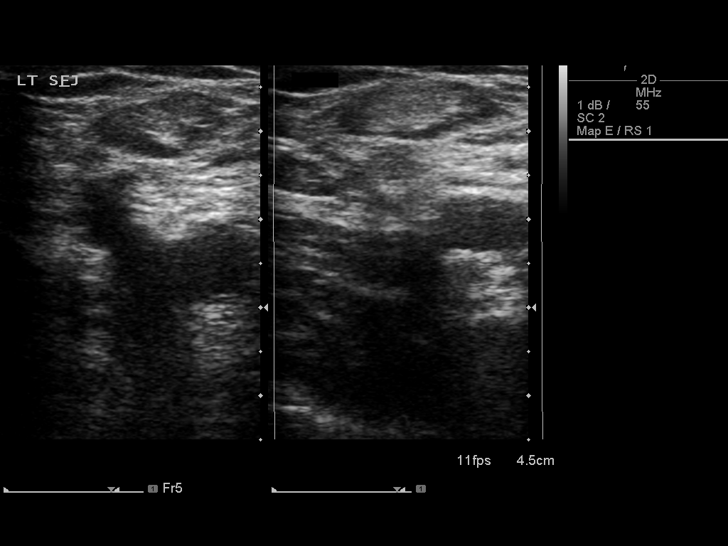
[im 8/42]
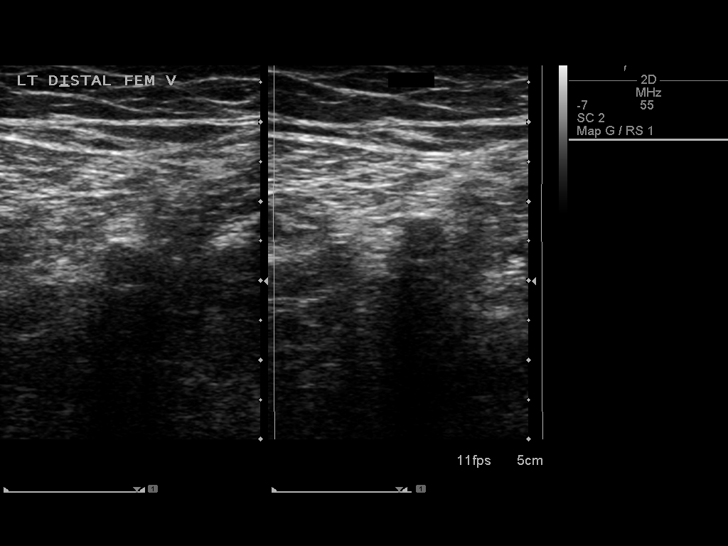
[im 11/42]
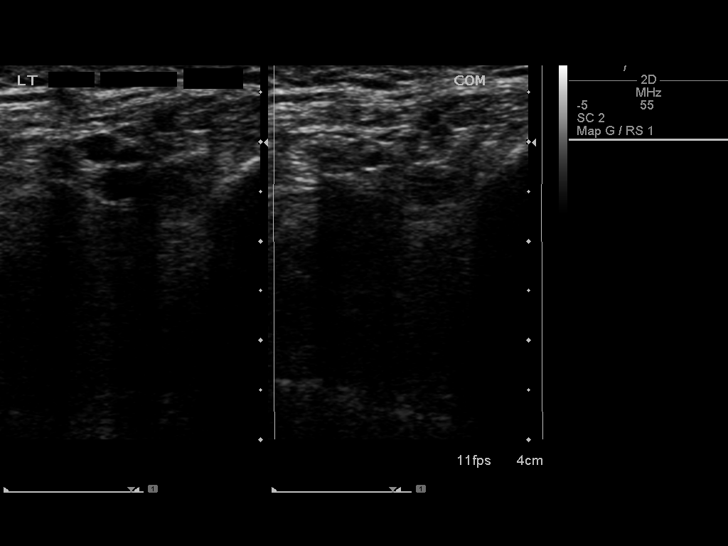
[im 15/42]
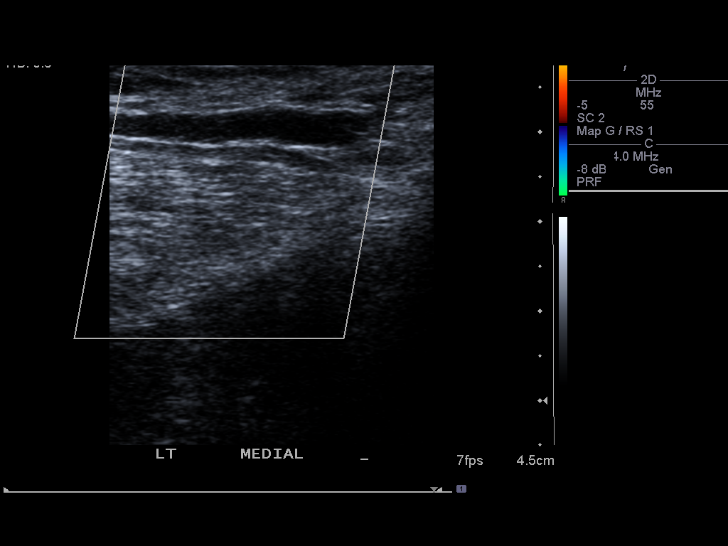
[im 18/42]
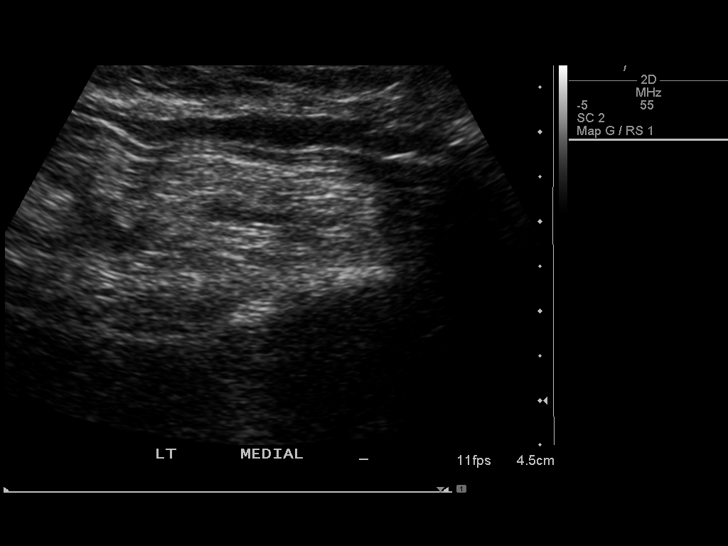
[im 22/42]
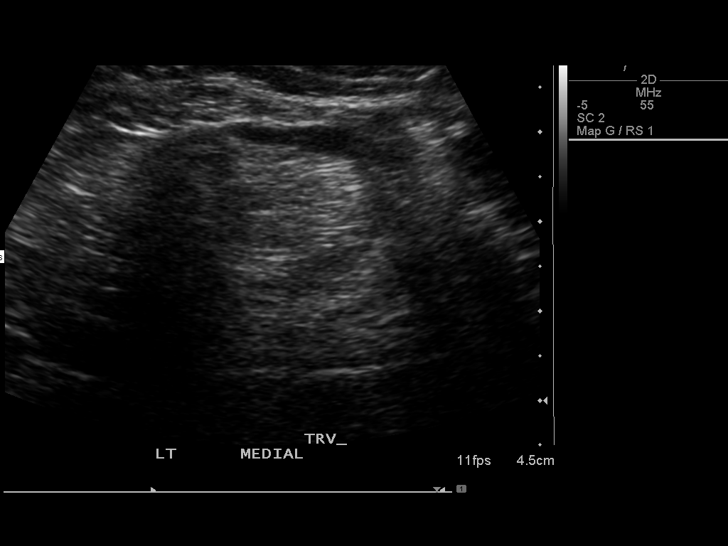
[im 24/42]
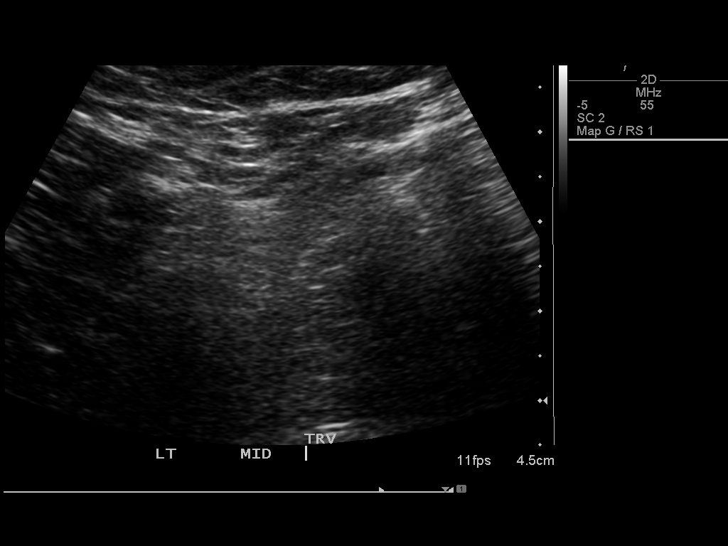
[im 27/42]
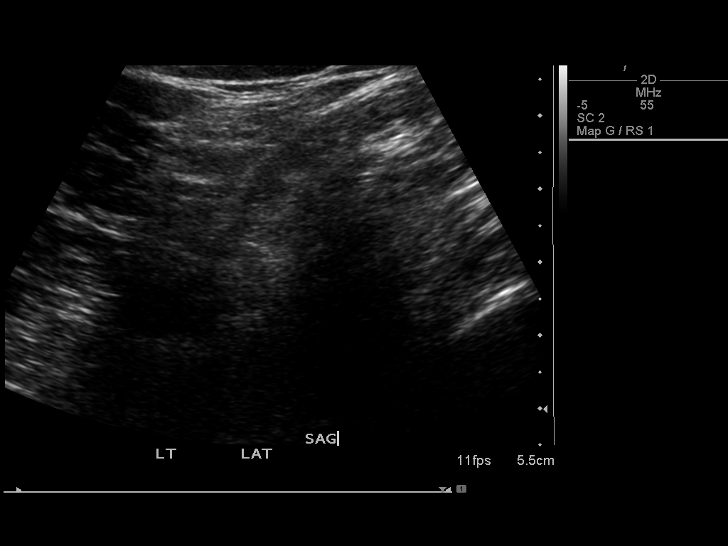
[im 31/42]
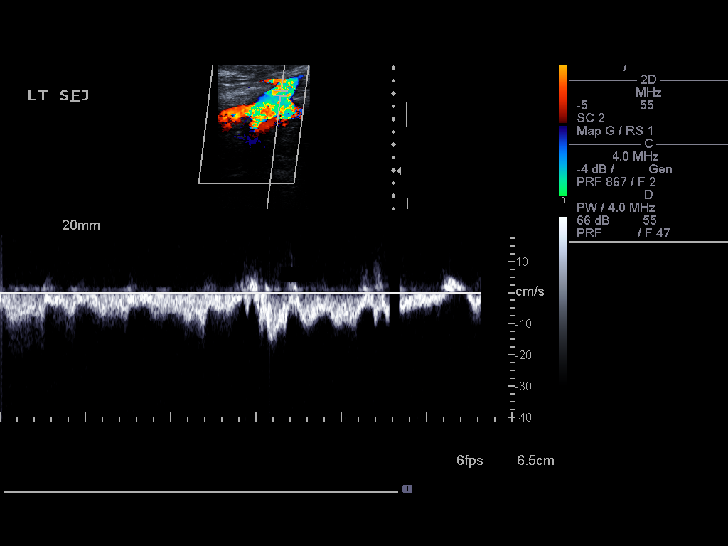
[im 34/42]
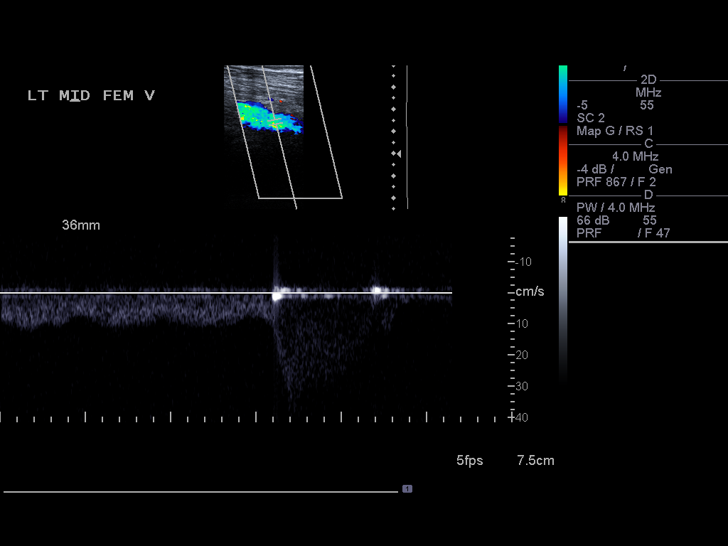
[im 38/42]
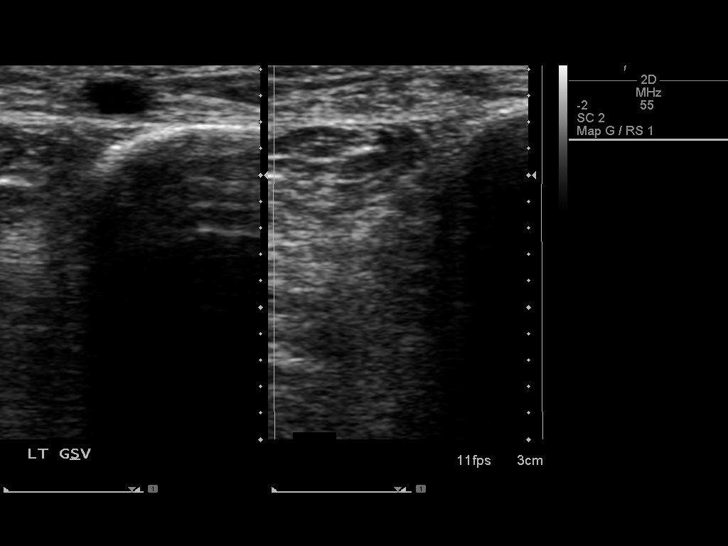
[im 42/42]
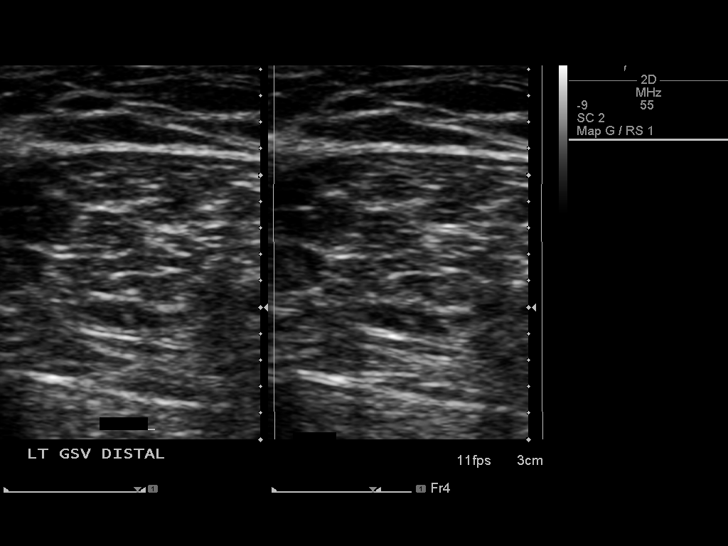

[13 of 24 positions shown; findings below may reference images not displayed]

FINDINGS: Contralateral Common Femoral Vein: Respiratory phasicity is normal
and symmetric with the symptomatic side. No evidence of thrombus.
Normal compressibility.

Common Femoral Vein: No evidence of thrombus. Normal
compressibility, respiratory phasicity and response to augmentation.

Saphenofemoral Junction: No evidence of thrombus. Normal
compressibility and flow on color Doppler imaging.

Profunda Femoral Vein: No evidence of thrombus. Normal
compressibility and flow on color Doppler imaging.

Femoral Vein: No evidence of thrombus. Normal compressibility,
respiratory phasicity and response to augmentation.

Popliteal Vein: No evidence of thrombus. Normal compressibility,
respiratory phasicity and response to augmentation.

Calf Veins: No evidence of thrombus within the posterior tibial
vein. Normal compressibility and flow on color Doppler imaging.
Anterior tibial and peroneal veins not well visualized on this exam.

Superficial Great Saphenous Vein: No evidence of thrombus. Normal
compressibility and flow on color Doppler imaging.

Venous Reflux:  None.

Other Findings: A small fluid collection is seen in the area of the
left popliteal fossa measuring 3.8 x 0.4 x 1.1 cm, suspicious for a
small Baker's cyst.
IMPRESSION: No evidence of deep venous thrombosis.

Small fluid collection in left popliteal fossa, suspicious for small
Baker's cyst.

## 2017-10-07 ENCOUNTER — Ambulatory Visit
Admission: RE | Admit: 2017-10-07 | Discharge: 2017-10-07 | Disposition: A | Discharge status: Home / Self Care | Payer: Medicare Other | Source: Ambulatory Visit | Attending: Internal Medicine | Admitting: Internal Medicine

## 2017-10-07 DIAGNOSIS — R922 Inconclusive mammogram: Secondary | ICD-10-CM | POA: Diagnosis not present

## 2017-10-07 DIAGNOSIS — N6310 Unspecified lump in the right breast, unspecified quadrant: Secondary | ICD-10-CM

## 2017-10-09 ENCOUNTER — Other Ambulatory Visit (HOSPITAL_COMMUNITY): Payer: Self-pay

## 2017-10-09 NOTE — Progress Notes (Signed)
Paramedicine Encounter    Patient ID: [name], male    DOB: 1945-12-26, 72 y.o.   MRN: [id number]   Patient Care Team: Wenda Low, MD as PCP - General (Internal Medicine) Lorretta Harp, MD as Consulting Physician (Cardiology)  Patient Active Problem List   Diagnosis Date Noted  . Biventricular ICD (implantable cardioverter-defibrillator) in place 07/04/2016  . PAD (peripheral artery disease) ([place]) 12/27/2015  . Erectile dysfunction 08/29/2015  . CKD (chronic kidney disease) stage 4, GFR 15-29 ml/min (HCC) 01/25/2015  . Diabetes mellitus due to underlying condition with diabetic chronic kidney disease ([place])   . Chronic kidney disease (CKD), stage IV (severe) ([place]) 11/24/2014  . DM (diabetes mellitus), type 2 with renal complications ([place]) 28/78/6767  . Chronic combined systolic and diastolic CHF (congestive heart failure) ([place]) 07/19/2014  . Hypoxia   . CHF exacerbation ([place]) 12/15/2013  . History of carotid artery stenosis 10/05/2013  . Carotid artery disease ([place]) 09/08/2013  . Hypercholesterolemia 09/08/2013  . CHF (congestive heart failure) ([place]) 06/02/2012  . Essential hypertension 06/02/2012  . At risk for sudden cardiac death May 02, 2012  . Cardiomyopathy, ischemic 05-02-12  . S/P ICD (internal cardiac defibrillator) procedure, 05-02-12, Medtronic device ro ICM May 02, 2012  . CKD (chronic kidney disease) stage 3, GFR 30-59 ml/min (HCC) 04/03/2012  . COPD (chronic obstructive pulmonary disease) ([place]) 04/03/2012  . Pulmonary edema  04/01/2012  . Chest pain at rest 04/01/2012  . Diabetes mellitus due to underlying condition ([place]) 11/01/2006  . ABUSE, COCAINE, EPISODIC 11/01/2006  . Polysubstance abuse ([place]) 11/01/2006  . MYOCARDIAL INFARCTION, HX OF 11/01/2006  . CAD (coronary artery disease) 11/01/2006    Current Outpatient Medications:  .  allopurinol (ZYLOPRIM) 300 MG tablet, TAKE 1 TABLET BY MOUTH EVERY DAY, Disp: 30 tablet, Rfl: 0 .  amLODipine (NORVASC) 5  MG tablet, TAKE 1 TABLET BY MOUTH EVERY MORNING, Disp: 90 tablet, Rfl: 3 .  aspirin 81 MG EC tablet, TAKE 1 TABLET (81 MG TOTAL) BY MOUTH DAILY., Disp: 90 tablet, Rfl: 2 .  atorvastatin (LIPITOR) 40 MG tablet, Take 1 tablet (40 mg total) by mouth every morning., Disp: 30 tablet, Rfl: 5 .  carvedilol (COREG) 12.5 MG tablet, Take 1 tablet (12.5 mg total) by mouth 2 (two) times daily with a meal., Disp: 60 tablet, Rfl: 2 .  clopidogrel (PLAVIX) 75 MG tablet, Take 1 tablet (75 mg total) by mouth daily., Disp: 90 tablet, Rfl: 3 .  glimepiride (AMARYL) 1 MG tablet, Take 1 tablet by mouth daily. Reported on 03/10/2015, Disp: , Rfl:  .  hydrALAZINE (APRESOLINE) 100 MG tablet, Take 1 tablet (100 mg total) by mouth 3 (three) times daily., Disp: 90 tablet, Rfl: 5 .  isosorbide mononitrate (IMDUR) 60 MG 24 hr tablet, TAKE 1 AND 1/2 TABLET BY MOUTH DAILY, Disp: 135 tablet, Rfl: 1 .  potassium chloride SA (K-DUR,KLOR-CON) 20 MEQ tablet, Take 1 tablet (20 mEq total) by mouth daily., Disp: 15 tablet, Rfl: 0 .  torsemide (DEMADEX) 20 MG tablet, TAKE 4 TABLETS BY MOUTH EVERY OTHER DAY ALTERNATING WITH 3 TABLETS EVERY OTHER DAY. (Patient taking differently: 80 mg daily. TAKE 4 TABLETS BY MOUTH EVERY OTHER DAY ALTERNATING WITH 3 TABLETS EVERY OTHER DAY.), Disp: 120 tablet, Rfl: 6 .  traZODone (DESYREL) 150 MG tablet, Take 150 mg by mouth at bedtime as needed for sleep. , Disp: , Rfl:  .  allopurinol (ZYLOPRIM) 100 MG tablet, Take 300 mg by mouth daily as needed (For gout.). , Disp: , Rfl:  .  azithromycin (ZITHROMAX) 250 MG tablet, Take 1 tablet (250 mg total) by mouth daily. Take first 2 tablets together, then 1 every day until finished. (Patient not taking: Reported on 08/15/2017), Disp: 6 tablet, Rfl: 0 .  docusate sodium (COLACE) 100 MG capsule, Take 1 capsule (100 mg total) by mouth 2 (two) times daily. Hold for diarrhea. (Patient not taking: Reported on 08/15/2017), Disp: 60 capsule, Rfl: 1 .  HYDROcodone-acetaminophen  (NORCO/VICODIN) 5-325 MG tablet, Take 1 tablet by mouth every 4 (four) hours as needed for moderate pain. (Patient not taking: Reported on 10/09/2017), Disp: 10 tablet, Rfl: 0 .  oxyCODONE-acetaminophen (ROXICET) 5-325 MG tablet, Take 1-2 tablets by mouth every 4 (four) hours as needed for severe pain. (Patient not taking: Reported on 08/15/2017), Disp: 31 tablet, Rfl: 0 No Known Allergies    Social History   Socioeconomic History  . Marital status: Single    Spouse name: Not on file  . Number of children: Not on file  . Years of education: Not on file  . Highest education level: Not on file  Occupational History  . Not on file  Social Needs  . Financial resource strain: Not on file  . Food insecurity:    Worry: Not on file    Inability: Not on file  . Transportation needs:    Medical: Not on file    Non-medical: Not on file  Tobacco Use  . Smoking status: Former Smoker    Last attempt to quit: 04/18/1982    Years since quitting: 35.5  . Smokeless tobacco: Never Used  Substance and Sexual Activity  . Alcohol use: No  . Drug use: No    Frequency: 7.0 times per week    Types: Marijuana    Comment: stopped three years ago  . Sexual activity: Never  Lifestyle  . Physical activity:    Days per week: Not on file    Minutes per session: Not on file  . Stress: Not on file  Relationships  . Social connections:    Talks on phone: Not on file    Gets together: Not on file    Attends religious service: Not on file    Active member of club or organization: Not on file    Attends meetings of clubs or organizations: Not on file    Relationship status: Not on file  . Intimate partner violence:    Fear of current or ex partner: Not on file    Emotionally abused: Not on file    Physically abused: Not on file    Forced sexual activity: Not on file  Other Topics Concern  . Not on file  Social History Narrative  . Not on file    Physical Exam      Future Appointments  Date  Time Provider [place]  01/01/2018 10:20 AM [facility] DEVICE REMOTES [facility]    BP 128/64   Pulse 60   Resp 14   Wt 170 lb (77.1 kg)   BMI 26.63 kg/m   Weight yesterday-169 Last visit weight-174 CBG PTA-79  Pt reports he is doing well, last visit on 7/1 I did 4 pill boxes for him, he still has 2 filled up. He denies missing any meds so im not sure what going on.  No complaints at this time.  meds verified and 2 pill boxes refilled. Confirmed with pt that he is still using pill box correctly.  Will be due for next visit in a month.  Will need  to refill potassium, clopidogrel, isosorbide before next visit.   Marylouise Stacks, [facility] Paramedic  10/09/17

## 2017-10-22 ENCOUNTER — Other Ambulatory Visit: Payer: Self-pay | Admitting: Cardiovascular Disease

## 2017-10-22 DIAGNOSIS — I6523 Occlusion and stenosis of bilateral carotid arteries: Secondary | ICD-10-CM

## 2017-10-29 ENCOUNTER — Encounter (HOSPITAL_COMMUNITY): Payer: Medicare Other

## 2017-10-31 ENCOUNTER — Ambulatory Visit (HOSPITAL_COMMUNITY)
Admission: RE | Admit: 2017-10-31 | Payer: Medicare Other | Source: Ambulatory Visit | Attending: Cardiovascular Disease | Admitting: Cardiovascular Disease

## 2017-10-31 DIAGNOSIS — R0989 Other specified symptoms and signs involving the circulatory and respiratory systems: Secondary | ICD-10-CM

## 2017-11-10 ENCOUNTER — Other Ambulatory Visit: Payer: Self-pay | Admitting: Cardiology

## 2017-11-10 LAB — CUP PACEART REMOTE DEVICE CHECK
Battery Remaining Longevity: 69 mo
Battery Voltage: 2.97 V
Brady Statistic AP VS Percent: 0.26 %
Brady Statistic AS VP Percent: 74.33 %
Brady Statistic RA Percent Paced: 23.78 %
Brady Statistic RV Percent Paced: 43.32 %
Date Time Interrogation Session: 20190724161603
HighPow Impedance: 53 Ohm
Implantable Lead Implant Date: 20140207
Implantable Lead Location: 753860
Implantable Lead Model: 4298
Implantable Pulse Generator Implant Date: 20170105
Lead Channel Impedance Value: 304 Ohm
Lead Channel Impedance Value: 342 Ohm
Lead Channel Impedance Value: 361 Ohm
Lead Channel Impedance Value: 418 Ohm
Lead Channel Impedance Value: 532 Ohm
Lead Channel Impedance Value: 551 Ohm
Lead Channel Pacing Threshold Amplitude: 0.5 V
Lead Channel Pacing Threshold Pulse Width: 0.4 ms
Lead Channel Sensing Intrinsic Amplitude: 1.25 mV
Lead Channel Sensing Intrinsic Amplitude: 15.625 mV
Lead Channel Sensing Intrinsic Amplitude: 15.625 mV
Lead Channel Setting Pacing Amplitude: 1.5 V
Lead Channel Setting Pacing Amplitude: 2.25 V
Lead Channel Setting Pacing Pulse Width: 0.4 ms
Lead Channel Setting Pacing Pulse Width: 0.4 ms
Lead Channel Setting Sensing Sensitivity: 0.3 mV
MDC IDC LEAD IMPLANT DT: 20140207
MDC IDC LEAD IMPLANT DT: 20170105
MDC IDC LEAD LOCATION: 753858
MDC IDC LEAD LOCATION: 753859
MDC IDC MSMT LEADCHNL LV IMPEDANCE VALUE: 285 Ohm
MDC IDC MSMT LEADCHNL LV IMPEDANCE VALUE: 304 Ohm
MDC IDC MSMT LEADCHNL LV IMPEDANCE VALUE: 399 Ohm
MDC IDC MSMT LEADCHNL LV IMPEDANCE VALUE: 475 Ohm
MDC IDC MSMT LEADCHNL LV IMPEDANCE VALUE: 551 Ohm
MDC IDC MSMT LEADCHNL LV IMPEDANCE VALUE: 608 Ohm
MDC IDC MSMT LEADCHNL LV PACING THRESHOLD AMPLITUDE: 1.125 V
MDC IDC MSMT LEADCHNL LV PACING THRESHOLD PULSEWIDTH: 0.4 ms
MDC IDC MSMT LEADCHNL RA SENSING INTR AMPL: 1.25 mV
MDC IDC MSMT LEADCHNL RV IMPEDANCE VALUE: 285 Ohm
MDC IDC MSMT LEADCHNL RV PACING THRESHOLD AMPLITUDE: 0.875 V
MDC IDC MSMT LEADCHNL RV PACING THRESHOLD PULSEWIDTH: 0.4 ms
MDC IDC SET LEADCHNL RV PACING AMPLITUDE: 2 V
MDC IDC STAT BRADY AP VP PERCENT: 23.73 %
MDC IDC STAT BRADY AS VS PERCENT: 1.67 %

## 2017-11-13 ENCOUNTER — Other Ambulatory Visit (HOSPITAL_COMMUNITY): Payer: Self-pay

## 2017-11-13 NOTE — Progress Notes (Addendum)
Paramedicine Encounter    Patient ID: [name], male    DOB: 10-Mar-1946, 72 y.o.   MRN: [id number]   Patient Care Team: Wenda Low, MD as PCP - General (Internal Medicine) Lorretta Harp, MD as Consulting Physician (Cardiology)  Patient Active Problem List   Diagnosis Date Noted  . Biventricular ICD (implantable cardioverter-defibrillator) in place 07/04/2016  . PAD (peripheral artery disease) ([place]) 12/27/2015  . Erectile dysfunction 08/29/2015  . CKD (chronic kidney disease) stage 4, GFR 15-29 ml/min (HCC) 01/25/2015  . Diabetes mellitus due to underlying condition with diabetic chronic kidney disease ([place])   . Chronic kidney disease (CKD), stage IV (severe) ([place]) 11/24/2014  . DM (diabetes mellitus), type 2 with renal complications ([place]) 14/97/0263  . Chronic combined systolic and diastolic CHF (congestive heart failure) ([place]) 07/19/2014  . Hypoxia   . CHF exacerbation ([place]) 12/15/2013  . History of carotid artery stenosis 10/05/2013  . Carotid artery disease ([place]) 09/08/2013  . Hypercholesterolemia 09/08/2013  . CHF (congestive heart failure) ([place]) 06/02/2012  . Essential hypertension 06/02/2012  . At risk for sudden cardiac death 2012/04/22  . Cardiomyopathy, ischemic 04/22/12  . S/P ICD (internal cardiac defibrillator) procedure, 04/22/2012, Medtronic device ro ICM 04/22/2012  . CKD (chronic kidney disease) stage 3, GFR 30-59 ml/min (HCC) 04/03/2012  . COPD (chronic obstructive pulmonary disease) ([place]) 04/03/2012  . Pulmonary edema  04/01/2012  . Chest pain at rest 04/01/2012  . Diabetes mellitus due to underlying condition ([place]) 11/01/2006  . ABUSE, COCAINE, EPISODIC 11/01/2006  . Polysubstance abuse ([place]) 11/01/2006  . MYOCARDIAL INFARCTION, HX OF 11/01/2006  . CAD (coronary artery disease) 11/01/2006    Current Outpatient Medications:  .  allopurinol (ZYLOPRIM) 100 MG tablet, Take 300 mg by mouth daily as needed (For gout.). , Disp: , Rfl:  .  allopurinol  (ZYLOPRIM) 300 MG tablet, TAKE 1 TABLET BY MOUTH EVERY DAY, Disp: 30 tablet, Rfl: 0 .  amLODipine (NORVASC) 5 MG tablet, TAKE 1 TABLET BY MOUTH EVERY MORNING, Disp: 90 tablet, Rfl: 3 .  aspirin 81 MG EC tablet, TAKE 1 TABLET (81 MG TOTAL) BY MOUTH DAILY., Disp: 90 tablet, Rfl: 2 .  atorvastatin (LIPITOR) 40 MG tablet, Take 1 tablet (40 mg total) by mouth every morning., Disp: 30 tablet, Rfl: 5 .  azithromycin (ZITHROMAX) 250 MG tablet, Take 1 tablet (250 mg total) by mouth daily. Take first 2 tablets together, then 1 every day until finished. (Patient not taking: Reported on 08/15/2017), Disp: 6 tablet, Rfl: 0 .  carvedilol (COREG) 12.5 MG tablet, TAKE 1 TABLET (12.5 MG TOTAL) BY MOUTH 2 (TWO) TIMES DAILY WITH A MEAL., Disp: 180 tablet, Rfl: 2 .  clopidogrel (PLAVIX) 75 MG tablet, Take 1 tablet (75 mg total) by mouth daily., Disp: 90 tablet, Rfl: 3 .  docusate sodium (COLACE) 100 MG capsule, Take 1 capsule (100 mg total) by mouth 2 (two) times daily. Hold for diarrhea. (Patient not taking: Reported on 08/15/2017), Disp: 60 capsule, Rfl: 1 .  glimepiride (AMARYL) 1 MG tablet, Take 1 tablet by mouth daily. Reported on 03/10/2015, Disp: , Rfl:  .  hydrALAZINE (APRESOLINE) 100 MG tablet, TAKE 1 TABLET (100 MG TOTAL) BY MOUTH 3 (THREE) TIMES DAILY., Disp: 270 tablet, Rfl: 2 .  HYDROcodone-acetaminophen (NORCO/VICODIN) 5-325 MG tablet, Take 1 tablet by mouth every 4 (four) hours as needed for moderate pain. (Patient not taking: Reported on 10/09/2017), Disp: 10 tablet, Rfl: 0 .  isosorbide mononitrate (IMDUR) 60 MG 24 hr tablet, TAKE 1 AND  1/2 TABLET BY MOUTH DAILY, Disp: 135 tablet, Rfl: 1 .  oxyCODONE-acetaminophen (ROXICET) 5-325 MG tablet, Take 1-2 tablets by mouth every 4 (four) hours as needed for severe pain. (Patient not taking: Reported on 08/15/2017), Disp: 31 tablet, Rfl: 0 .  potassium chloride SA (K-DUR,KLOR-CON) 20 MEQ tablet, Take 1 tablet (20 mEq total) by mouth daily., Disp: 15 tablet, Rfl: 0 .   torsemide (DEMADEX) 20 MG tablet, TAKE 4 TABLETS BY MOUTH EVERY OTHER DAY ALTERNATING WITH 3 TABLETS EVERY OTHER DAY. (Patient taking differently: 80 mg daily. TAKE 4 TABLETS BY MOUTH EVERY OTHER DAY ALTERNATING WITH 3 TABLETS EVERY OTHER DAY.), Disp: 120 tablet, Rfl: 6 .  traZODone (DESYREL) 150 MG tablet, Take 150 mg by mouth at bedtime as needed for sleep. , Disp: , Rfl:  No Known Allergies    Social History   Socioeconomic History  . Marital status: Single    Spouse name: Not on file  . Number of children: Not on file  . Years of education: Not on file  . Highest education level: Not on file  Occupational History  . Not on file  Social Needs  . Financial resource strain: Not on file  . Food insecurity:    Worry: Not on file    Inability: Not on file  . Transportation needs:    Medical: Not on file    Non-medical: Not on file  Tobacco Use  . Smoking status: Former Smoker    Last attempt to quit: 04/18/1982    Years since quitting: 35.5  . Smokeless tobacco: Never Used  Substance and Sexual Activity  . Alcohol use: No  . Drug use: No    Frequency: 7.0 times per week    Types: Marijuana    Comment: stopped three years ago  . Sexual activity: Never  Lifestyle  . Physical activity:    Days per week: Not on file    Minutes per session: Not on file  . Stress: Not on file  Relationships  . Social connections:    Talks on phone: Not on file    Gets together: Not on file    Attends religious service: Not on file    Active member of club or organization: Not on file    Attends meetings of clubs or organizations: Not on file    Relationship status: Not on file  . Intimate partner violence:    Fear of current or ex partner: Not on file    Emotionally abused: Not on file    Physically abused: Not on file    Forced sexual activity: Not on file  Other Topics Concern  . Not on file  Social History Narrative  . Not on file    Physical Exam      Future Appointments   Date Time Provider [place]  01/01/2018 10:20 AM [facility] DEVICE REMOTES [facility]    BP (!) 122/56   Pulse 62   Resp 14   Wt 167 lb (75.8 kg)   SpO2 98%   BMI 26.16 kg/m   CBG PTA-79  Weight yesterday-167 Last visit weight-170  Pt reports he is doing great, no issues or complaints at this time. meds verified and pill box refilled. No swelling noted. No sob.  He will need asa, carvedilol and atorvastatin by next visit in a month.   Marylouise Stacks, [facility] Paramedic  11/13/17

## 2017-12-09 ENCOUNTER — Other Ambulatory Visit: Payer: Self-pay | Admitting: Cardiology

## 2017-12-10 ENCOUNTER — Other Ambulatory Visit (HOSPITAL_COMMUNITY): Payer: Self-pay

## 2017-12-10 NOTE — Progress Notes (Signed)
Paramedicine Encounter    Patient ID: [name], male    DOB: 1945/10/02, 72 y.o.   MRN: [id number]   Patient Care Team: Wenda Low, MD as PCP - General (Internal Medicine) Lorretta Harp, MD as Consulting Physician (Cardiology)  Patient Active Problem List   Diagnosis Date Noted  . Biventricular ICD (implantable cardioverter-defibrillator) in place 07/04/2016  . PAD (peripheral artery disease) ([place]) 12/27/2015  . Erectile dysfunction 08/29/2015  . CKD (chronic kidney disease) stage 4, GFR 15-29 ml/min (HCC) 01/25/2015  . Diabetes mellitus due to underlying condition with diabetic chronic kidney disease ([place])   . Chronic kidney disease (CKD), stage IV (severe) ([place]) 11/24/2014  . DM (diabetes mellitus), type 2 with renal complications ([place]) 68/02/7516  . Chronic combined systolic and diastolic CHF (congestive heart failure) ([place]) 07/19/2014  . Hypoxia   . CHF exacerbation ([place]) 12/15/2013  . History of carotid artery stenosis 10/05/2013  . Carotid artery disease ([place]) 09/08/2013  . Hypercholesterolemia 09/08/2013  . CHF (congestive heart failure) ([place]) 06/02/2012  . Essential hypertension 06/02/2012  . At risk for sudden cardiac death 2012/04/28  . Cardiomyopathy, ischemic 28-Apr-2012  . S/P ICD (internal cardiac defibrillator) procedure, Apr 28, 2012, Medtronic device ro ICM 28-Apr-2012  . CKD (chronic kidney disease) stage 3, GFR 30-59 ml/min (HCC) 04/03/2012  . COPD (chronic obstructive pulmonary disease) ([place]) 04/03/2012  . Pulmonary edema  04/01/2012  . Chest pain at rest 04/01/2012  . Diabetes mellitus due to underlying condition ([place]) 11/01/2006  . ABUSE, COCAINE, EPISODIC 11/01/2006  . Polysubstance abuse ([place]) 11/01/2006  . MYOCARDIAL INFARCTION, HX OF 11/01/2006  . CAD (coronary artery disease) 11/01/2006    Current Outpatient Medications:  .  allopurinol (ZYLOPRIM) 300 MG tablet, TAKE 1 TABLET BY MOUTH EVERY DAY, Disp: 30 tablet, Rfl: 0 .  amLODipine (NORVASC) 5  MG tablet, TAKE 1 TABLET BY MOUTH EVERY MORNING, Disp: 90 tablet, Rfl: 3 .  aspirin 81 MG EC tablet, TAKE 1 TABLET (81 MG TOTAL) BY MOUTH DAILY., Disp: 90 tablet, Rfl: 2 .  atorvastatin (LIPITOR) 40 MG tablet, Take 1 tablet (40 mg total) by mouth every morning., Disp: 30 tablet, Rfl: 5 .  carvedilol (COREG) 12.5 MG tablet, TAKE 1 TABLET (12.5 MG TOTAL) BY MOUTH 2 (TWO) TIMES DAILY WITH A MEAL., Disp: 180 tablet, Rfl: 2 .  clopidogrel (PLAVIX) 75 MG tablet, Take 1 tablet (75 mg total) by mouth daily., Disp: 90 tablet, Rfl: 3 .  glimepiride (AMARYL) 1 MG tablet, Take 1 tablet by mouth daily. Reported on 03/10/2015, Disp: , Rfl:  .  hydrALAZINE (APRESOLINE) 100 MG tablet, TAKE 1 TABLET (100 MG TOTAL) BY MOUTH 3 (THREE) TIMES DAILY., Disp: 270 tablet, Rfl: 2 .  isosorbide mononitrate (IMDUR) 60 MG 24 hr tablet, TAKE 1 AND 1/2 TABLET BY MOUTH DAILY, Disp: 135 tablet, Rfl: 1 .  potassium chloride SA (K-DUR,KLOR-CON) 20 MEQ tablet, Take 1 tablet (20 mEq total) by mouth daily., Disp: 15 tablet, Rfl: 0 .  torsemide (DEMADEX) 20 MG tablet, TAKE 4 TABLETS BY MOUTH EVERY OTHER DAY ALTERNATING WITH 3 TABLETS EVERY OTHER DAY. (Patient taking differently: 80 mg daily. TAKE 4 TABLETS BY MOUTH EVERY OTHER DAY ALTERNATING WITH 3 TABLETS EVERY OTHER DAY.), Disp: 120 tablet, Rfl: 6 .  traZODone (DESYREL) 150 MG tablet, Take 150 mg by mouth at bedtime as needed for sleep. , Disp: , Rfl:  .  allopurinol (ZYLOPRIM) 100 MG tablet, Take 300 mg by mouth daily as needed (For gout.). , Disp: , Rfl:  .  azithromycin (ZITHROMAX) 250 MG tablet, Take 1 tablet (250 mg total) by mouth daily. Take first 2 tablets together, then 1 every day until finished. (Patient not taking: Reported on 08/15/2017), Disp: 6 tablet, Rfl: 0 .  docusate sodium (COLACE) 100 MG capsule, Take 1 capsule (100 mg total) by mouth 2 (two) times daily. Hold for diarrhea. (Patient not taking: Reported on 08/15/2017), Disp: 60 capsule, Rfl: 1 .   HYDROcodone-acetaminophen (NORCO/VICODIN) 5-325 MG tablet, Take 1 tablet by mouth every 4 (four) hours as needed for moderate pain. (Patient not taking: Reported on 10/09/2017), Disp: 10 tablet, Rfl: 0 .  oxyCODONE-acetaminophen (ROXICET) 5-325 MG tablet, Take 1-2 tablets by mouth every 4 (four) hours as needed for severe pain. (Patient not taking: Reported on 08/15/2017), Disp: 31 tablet, Rfl: 0 No Known Allergies    Social History   Socioeconomic History  . Marital status: Single    Spouse name: Not on file  . Number of children: Not on file  . Years of education: Not on file  . Highest education level: Not on file  Occupational History  . Not on file  Social Needs  . Financial resource strain: Not on file  . Food insecurity:    Worry: Not on file    Inability: Not on file  . Transportation needs:    Medical: Not on file    Non-medical: Not on file  Tobacco Use  . Smoking status: Former Smoker    Last attempt to quit: 04/18/1982    Years since quitting: 35.6  . Smokeless tobacco: Never Used  Substance and Sexual Activity  . Alcohol use: No  . Drug use: No    Frequency: 7.0 times per week    Types: Marijuana    Comment: stopped three years ago  . Sexual activity: Never  Lifestyle  . Physical activity:    Days per week: Not on file    Minutes per session: Not on file  . Stress: Not on file  Relationships  . Social connections:    Talks on phone: Not on file    Gets together: Not on file    Attends religious service: Not on file    Active member of club or organization: Not on file    Attends meetings of clubs or organizations: Not on file    Relationship status: Not on file  . Intimate partner violence:    Fear of current or ex partner: Not on file    Emotionally abused: Not on file    Physically abused: Not on file    Forced sexual activity: Not on file  Other Topics Concern  . Not on file  Social History Narrative  . Not on file    Physical Exam   Constitutional: He is oriented to person, place, and time. He appears well-developed.  HENT:  Head: Normocephalic.  Cardiovascular: Normal rate.  Pulmonary/Chest: Effort normal.  Abdominal: Soft.  Neurological: He is alert and oriented to person, place, and time.        Future Appointments  Date Time Provider [place]  01/01/2018 10:20 AM [facility] DEVICE REMOTES [facility]    BP 134/62   Pulse 78   Resp 14   Wt 172 lb (78 kg)   SpO2 96%   BMI 26.94 kg/m   Weight yesterday-172 Last visit weight-167 CBG PTA-90  Pt reports feeling great, I filled up his 4 pill boxes approx 1 month ago and he still has one and a half week worth left--he  should be on his week now, so apparently he isnt taking them daily.  meds verified and 4 pill boxes refilled.  Went to pharmacy to p/u meds for him--in 2 pill boxes he needs asa placed in there. The asa is on order and will be in tomor so I will come back tomor and p/u and take back to him to complete the 2 pill boxes that needs the asa.  Contacted a Ms Gwendlyn Deutscher sending a staff message regarding his missed carotid check appoint for him to be contacted to resch for that. Left my contact info in case she has trouble getting in contact with him. Discussed with him the importance of taking meds daily.   Marylouise Stacks, [place] [facility] Paramedic  12/10/17

## 2017-12-13 ENCOUNTER — Encounter (HOSPITAL_COMMUNITY): Payer: Self-pay

## 2017-12-17 ENCOUNTER — Other Ambulatory Visit (HOSPITAL_COMMUNITY): Payer: Self-pay

## 2017-12-17 NOTE — Progress Notes (Signed)
Came out today for place the asa in 2 of pts pill boxes.  He also inquired about a cane and after speaking to [place] she advised that medicare should be able to provide one if he gets prescription from PCP, so he is going to try that route. I advised he could just go to [facility] or any pharmacy to get one also.   Marylouise Stacks, EMT-Paramedic  12/17/17

## 2017-12-26 ENCOUNTER — Ambulatory Visit (HOSPITAL_COMMUNITY)
Admission: RE | Admit: 2017-12-26 | Payer: Medicare Other | Source: Ambulatory Visit | Attending: Cardiovascular Disease | Admitting: Cardiovascular Disease

## 2018-01-01 ENCOUNTER — Ambulatory Visit (INDEPENDENT_AMBULATORY_CARE_PROVIDER_SITE_OTHER): Payer: Medicare Other | Admitting: *Deleted

## 2018-01-01 DIAGNOSIS — I255 Ischemic cardiomyopathy: Secondary | ICD-10-CM

## 2018-01-01 NOTE — Progress Notes (Signed)
Remote ICD transmission.   

## 2018-01-10 DIAGNOSIS — M1712 Unilateral primary osteoarthritis, left knee: Secondary | ICD-10-CM | POA: Diagnosis not present

## 2018-01-15 ENCOUNTER — Other Ambulatory Visit (HOSPITAL_COMMUNITY): Payer: Self-pay

## 2018-01-15 NOTE — Progress Notes (Signed)
Paramedicine Encounter    Patient ID: [name], male    DOB: 1945-11-07, 72 y.o.   MRN: [id number]   Patient Care Team: Wenda Low, MD as PCP - General (Internal Medicine) Lorretta Harp, MD as Consulting Physician (Cardiology)  Patient Active Problem List   Diagnosis Date Noted  . Biventricular ICD (implantable cardioverter-defibrillator) in place 07/04/2016  . PAD (peripheral artery disease) ([place]) 12/27/2015  . Erectile dysfunction 08/29/2015  . CKD (chronic kidney disease) stage 4, GFR 15-29 ml/min (HCC) 01/25/2015  . Diabetes mellitus due to underlying condition with diabetic chronic kidney disease ([place])   . Chronic kidney disease (CKD), stage IV (severe) ([place]) 11/24/2014  . DM (diabetes mellitus), type 2 with renal complications ([place]) 40/98/1191  . Chronic combined systolic and diastolic CHF (congestive heart failure) ([place]) 07/19/2014  . Hypoxia   . CHF exacerbation ([place]) 12/15/2013  . History of carotid artery stenosis 10/05/2013  . Carotid artery disease ([place]) 09/08/2013  . Hypercholesterolemia 09/08/2013  . CHF (congestive heart failure) ([place]) 06/02/2012  . Essential hypertension 06/02/2012  . At risk for sudden cardiac death 01-May-2012  . Cardiomyopathy, ischemic 05-01-2012  . S/P ICD (internal cardiac defibrillator) procedure, 05/01/12, Medtronic device ro ICM 2012/05/01  . CKD (chronic kidney disease) stage 3, GFR 30-59 ml/min (HCC) 04/03/2012  . COPD (chronic obstructive pulmonary disease) ([place]) 04/03/2012  . Pulmonary edema  04/01/2012  . Chest pain at rest 04/01/2012  . Diabetes mellitus due to underlying condition ([place]) 11/01/2006  . ABUSE, COCAINE, EPISODIC 11/01/2006  . Polysubstance abuse ([place]) 11/01/2006  . MYOCARDIAL INFARCTION, HX OF 11/01/2006  . CAD (coronary artery disease) 11/01/2006    Current Outpatient Medications:  .  allopurinol (ZYLOPRIM) 300 MG tablet, TAKE 1 TABLET BY MOUTH EVERY DAY, Disp: 30 tablet, Rfl: 0 .  amLODipine (NORVASC) 5  MG tablet, TAKE 1 TABLET BY MOUTH EVERY MORNING, Disp: 90 tablet, Rfl: 3 .  aspirin 81 MG EC tablet, TAKE 1 TABLET (81 MG TOTAL) BY MOUTH DAILY., Disp: 90 tablet, Rfl: 2 .  atorvastatin (LIPITOR) 40 MG tablet, Take 1 tablet (40 mg total) by mouth every morning., Disp: 30 tablet, Rfl: 5 .  carvedilol (COREG) 12.5 MG tablet, TAKE 1 TABLET (12.5 MG TOTAL) BY MOUTH 2 (TWO) TIMES DAILY WITH A MEAL., Disp: 180 tablet, Rfl: 2 .  clopidogrel (PLAVIX) 75 MG tablet, Take 1 tablet (75 mg total) by mouth daily., Disp: 90 tablet, Rfl: 3 .  glimepiride (AMARYL) 1 MG tablet, Take 1 tablet by mouth daily. Reported on 03/10/2015, Disp: , Rfl:  .  hydrALAZINE (APRESOLINE) 100 MG tablet, TAKE 1 TABLET (100 MG TOTAL) BY MOUTH 3 (THREE) TIMES DAILY., Disp: 270 tablet, Rfl: 2 .  isosorbide mononitrate (IMDUR) 60 MG 24 hr tablet, TAKE 1 AND 1/2 TABLET BY MOUTH DAILY, Disp: 135 tablet, Rfl: 1 .  potassium chloride SA (K-DUR,KLOR-CON) 20 MEQ tablet, Take 1 tablet (20 mEq total) by mouth daily., Disp: 15 tablet, Rfl: 0 .  torsemide (DEMADEX) 20 MG tablet, TAKE 4 TABLETS BY MOUTH EVERY OTHER DAY ALTERNATING WITH 3 TABLETS EVERY OTHER DAY. (Patient taking differently: 80 mg daily. TAKE 4 TABLETS BY MOUTH EVERY OTHER DAY ALTERNATING WITH 3 TABLETS EVERY OTHER DAY.), Disp: 120 tablet, Rfl: 6 .  traZODone (DESYREL) 150 MG tablet, Take 150 mg by mouth at bedtime as needed for sleep. , Disp: , Rfl:  .  allopurinol (ZYLOPRIM) 100 MG tablet, Take 300 mg by mouth daily as needed (For gout.). , Disp: , Rfl:  .  azithromycin (ZITHROMAX) 250 MG tablet, Take 1 tablet (250 mg total) by mouth daily. Take first 2 tablets together, then 1 every day until finished. (Patient not taking: Reported on 08/15/2017), Disp: 6 tablet, Rfl: 0 .  docusate sodium (COLACE) 100 MG capsule, Take 1 capsule (100 mg total) by mouth 2 (two) times daily. Hold for diarrhea. (Patient not taking: Reported on 08/15/2017), Disp: 60 capsule, Rfl: 1 .   HYDROcodone-acetaminophen (NORCO/VICODIN) 5-325 MG tablet, Take 1 tablet by mouth every 4 (four) hours as needed for moderate pain. (Patient not taking: Reported on 10/09/2017), Disp: 10 tablet, Rfl: 0 .  oxyCODONE-acetaminophen (ROXICET) 5-325 MG tablet, Take 1-2 tablets by mouth every 4 (four) hours as needed for severe pain. (Patient not taking: Reported on 08/15/2017), Disp: 31 tablet, Rfl: 0 No Known Allergies    Social History   Socioeconomic History  . Marital status: Single    Spouse name: Not on file  . Number of children: Not on file  . Years of education: Not on file  . Highest education level: Not on file  Occupational History  . Not on file  Social Needs  . Financial resource strain: Not on file  . Food insecurity:    Worry: Not on file    Inability: Not on file  . Transportation needs:    Medical: Not on file    Non-medical: Not on file  Tobacco Use  . Smoking status: Former Smoker    Last attempt to quit: 04/18/1982    Years since quitting: 35.7  . Smokeless tobacco: Never Used  Substance and Sexual Activity  . Alcohol use: No  . Drug use: No    Frequency: 7.0 times per week    Types: Marijuana    Comment: stopped three years ago  . Sexual activity: Never  Lifestyle  . Physical activity:    Days per week: Not on file    Minutes per session: Not on file  . Stress: Not on file  Relationships  . Social connections:    Talks on phone: Not on file    Gets together: Not on file    Attends religious service: Not on file    Active member of club or organization: Not on file    Attends meetings of clubs or organizations: Not on file    Relationship status: Not on file  . Intimate partner violence:    Fear of current or ex partner: Not on file    Emotionally abused: Not on file    Physically abused: Not on file    Forced sexual activity: Not on file  Other Topics Concern  . Not on file  Social History Narrative  . Not on file    Physical  Exam      Future Appointments  Date Time Provider [place]  04/02/2018  8:50 AM [facility] DEVICE REMOTES [facility]    BP 136/60   Pulse 70   Resp 15   Wt 167 lb (75.8 kg)   SpO2 98%   BMI 26.16 kg/m   Weight yesterday-? Last visit weight-172 CBG PTA-74  Pt home visit today, pt still has one week left of his pill boxes when he should have none left. He admits to going to [place] with his SO and leaving his pill box behind. I advised him to be sure to take his meds with him and not miss any doses. We have discussed this before as this was an issue the last time I came  out here. He is placing himself at higher risk for cardiac event and stroke and this was explained to him last time as well and said to him again today.  meds verified and pill box refilled.  His carotid check was rescheduled however he was a no show again. Sent a message to the staff asking for another appointment and I will do a reminder call to him to be sure he goes.  Ordered torsemide-one pill box needs torsemide placed-he states he will pikc up med tomor and give me a call so I can come out to place it.  Pt denies any sob, no dizziness. Pt denies c/p. Pt reports good urnie output, no bleeding issues.   Marylouise Stacks, [facility] Paramedic  01/15/18

## 2018-01-22 ENCOUNTER — Ambulatory Visit (INDEPENDENT_AMBULATORY_CARE_PROVIDER_SITE_OTHER): Payer: Medicare Other | Admitting: Cardiovascular Disease

## 2018-01-22 ENCOUNTER — Encounter: Payer: Self-pay | Admitting: Cardiovascular Disease

## 2018-01-22 VITALS — BP 104/56 | HR 60 | Ht 68.0 in | Wt 169.0 lb

## 2018-01-22 DIAGNOSIS — Z95810 Presence of automatic (implantable) cardiac defibrillator: Secondary | ICD-10-CM | POA: Diagnosis not present

## 2018-01-22 DIAGNOSIS — Z9189 Other specified personal risk factors, not elsewhere classified: Secondary | ICD-10-CM | POA: Diagnosis not present

## 2018-01-22 DIAGNOSIS — I5042 Chronic combined systolic (congestive) and diastolic (congestive) heart failure: Secondary | ICD-10-CM

## 2018-01-22 DIAGNOSIS — I739 Peripheral vascular disease, unspecified: Secondary | ICD-10-CM | POA: Diagnosis not present

## 2018-01-22 DIAGNOSIS — I255 Ischemic cardiomyopathy: Secondary | ICD-10-CM

## 2018-01-22 DIAGNOSIS — I2510 Atherosclerotic heart disease of native coronary artery without angina pectoris: Secondary | ICD-10-CM

## 2018-01-22 DIAGNOSIS — E1151 Type 2 diabetes mellitus with diabetic peripheral angiopathy without gangrene: Secondary | ICD-10-CM | POA: Diagnosis not present

## 2018-01-22 DIAGNOSIS — E7800 Pure hypercholesterolemia, unspecified: Secondary | ICD-10-CM | POA: Diagnosis not present

## 2018-01-22 DIAGNOSIS — N184 Chronic kidney disease, stage 4 (severe): Secondary | ICD-10-CM

## 2018-01-22 NOTE — Patient Instructions (Signed)
Medication Instructions:  Dr Sallyanne Kuster recommends that you continue on your current medications as directed. Please refer to the Current Medication list given to you today. If you need a refill on your cardiac medications before your next appointment, please call your pharmacy.   Testing/Procedures: Remote monitoring is used to monitor your Pacemaker of ICD from home. This monitoring reduces the number of office visits required to check your device to one time per year. It allows [place] to keep an eye on the functioning of your device to ensure it is working properly. You are scheduled for a device check from home on Wednesday, April 02, 2018. You may send your transmission at any time that day. If you have a wireless device, the transmission will be sent automatically. After your physician reviews your transmission, you will receive a postcard with your next transmission date.  Follow-Up: At [facility], you and your health needs are our priority.  As part of our continuing mission to provide you with exceptional heart care, we have created designated Provider Care Teams.  These Care Teams include your primary Cardiologist (physician) and Advanced Practice Providers (APPs -  Physician Assistants and Nurse Practitioners) who all work together to provide you with the care you need, when you need it. You will need a follow up appointment in 12 months.  Please call our office 2 months in advance to schedule this appointment.  You may see Sanda Klein, MD or one of the following Advanced Practice Providers on your designated Care Team: [place], [place] . Fabian Sharp, PA-C
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
## 2018-01-23 LAB — CUP PACEART REMOTE DEVICE CHECK
Battery Remaining Longevity: 65 mo
Battery Voltage: 2.97 V
Brady Statistic AP VS Percent: 0.55 %
Brady Statistic RA Percent Paced: 42.72 %
Date Time Interrogation Session: 20191023062604
HighPow Impedance: 57 Ohm
Implantable Lead Implant Date: 20140207
Implantable Pulse Generator Implant Date: 20170105
Lead Channel Impedance Value: 247 Ohm
Lead Channel Impedance Value: 418 Ohm
Lead Channel Impedance Value: 513 Ohm
Lead Channel Impedance Value: 513 Ohm
Lead Channel Impedance Value: 532 Ohm
Lead Channel Impedance Value: 551 Ohm
Lead Channel Impedance Value: 589 Ohm
Lead Channel Pacing Threshold Amplitude: 0.5 V
Lead Channel Pacing Threshold Amplitude: 1 V
Lead Channel Pacing Threshold Pulse Width: 0.4 ms
Lead Channel Pacing Threshold Pulse Width: 0.4 ms
Lead Channel Sensing Intrinsic Amplitude: 1.25 mV
Lead Channel Sensing Intrinsic Amplitude: 15.375 mV
Lead Channel Setting Pacing Amplitude: 1.5 V
Lead Channel Setting Pacing Pulse Width: 0.4 ms
Lead Channel Setting Pacing Pulse Width: 0.4 ms
MDC IDC LEAD IMPLANT DT: 20140207
MDC IDC LEAD IMPLANT DT: 20170105
MDC IDC LEAD LOCATION: 753858
MDC IDC LEAD LOCATION: 753859
MDC IDC LEAD LOCATION: 753860
MDC IDC MSMT LEADCHNL LV IMPEDANCE VALUE: 304 Ohm
MDC IDC MSMT LEADCHNL LV IMPEDANCE VALUE: 342 Ohm
MDC IDC MSMT LEADCHNL LV IMPEDANCE VALUE: 342 Ohm
MDC IDC MSMT LEADCHNL LV IMPEDANCE VALUE: 513 Ohm
MDC IDC MSMT LEADCHNL RA IMPEDANCE VALUE: 304 Ohm
MDC IDC MSMT LEADCHNL RA SENSING INTR AMPL: 1.25 mV
MDC IDC MSMT LEADCHNL RV IMPEDANCE VALUE: 475 Ohm
MDC IDC MSMT LEADCHNL RV PACING THRESHOLD AMPLITUDE: 0.625 V
MDC IDC MSMT LEADCHNL RV PACING THRESHOLD PULSEWIDTH: 0.4 ms
MDC IDC MSMT LEADCHNL RV SENSING INTR AMPL: 15.375 mV
MDC IDC SET LEADCHNL LV PACING AMPLITUDE: 2 V
MDC IDC SET LEADCHNL RV PACING AMPLITUDE: 2 V
MDC IDC SET LEADCHNL RV SENSING SENSITIVITY: 0.3 mV
MDC IDC STAT BRADY AP VP PERCENT: 42.91 %
MDC IDC STAT BRADY AS VP PERCENT: 55.02 %
MDC IDC STAT BRADY AS VS PERCENT: 1.53 %
MDC IDC STAT BRADY RV PERCENT PACED: 50.71 %

## 2018-01-28 LAB — CUP PACEART INCLINIC DEVICE CHECK
Battery Remaining Longevity: 62 mo
Brady Statistic AP VP Percent: 33.5 %
Brady Statistic AP VS Percent: 0.4 %
Brady Statistic AS VP Percent: 64.58 %
Brady Statistic AS VS Percent: 1.52 %
Brady Statistic RA Percent Paced: 33.5 %
Brady Statistic RV Percent Paced: 47.84 %
HighPow Impedance: 56 Ohm
Implantable Lead Implant Date: 20140207
Implantable Lead Implant Date: 20140207
Implantable Lead Location: 753859
Implantable Lead Location: 753860
Implantable Lead Model: 5076
Lead Channel Impedance Value: 247 Ohm
Lead Channel Impedance Value: 342 Ohm
Lead Channel Impedance Value: 342 Ohm
Lead Channel Impedance Value: 342 Ohm
Lead Channel Impedance Value: 399 Ohm
Lead Channel Impedance Value: 418 Ohm
Lead Channel Impedance Value: 551 Ohm
Lead Channel Impedance Value: 551 Ohm
Lead Channel Impedance Value: 589 Ohm
Lead Channel Impedance Value: 646 Ohm
Lead Channel Pacing Threshold Amplitude: 0.5 V
Lead Channel Pacing Threshold Amplitude: 0.5 V
Lead Channel Pacing Threshold Amplitude: 1.125 V
Lead Channel Pacing Threshold Pulse Width: 0.4 ms
Lead Channel Pacing Threshold Pulse Width: 0.4 ms
Lead Channel Sensing Intrinsic Amplitude: 1.125 mV
Lead Channel Sensing Intrinsic Amplitude: 1.375 mV
Lead Channel Sensing Intrinsic Amplitude: 12.125 mV
Lead Channel Setting Pacing Amplitude: 1.5 V
Lead Channel Setting Pacing Amplitude: 2.25 V
Lead Channel Setting Pacing Pulse Width: 0.4 ms
Lead Channel Setting Sensing Sensitivity: 0.3 mV
MDC IDC LEAD IMPLANT DT: 20170105
MDC IDC LEAD LOCATION: 753858
MDC IDC MSMT BATTERY VOLTAGE: 2.97 V
MDC IDC MSMT LEADCHNL LV IMPEDANCE VALUE: 532 Ohm
MDC IDC MSMT LEADCHNL LV IMPEDANCE VALUE: 532 Ohm
MDC IDC MSMT LEADCHNL LV PACING THRESHOLD PULSEWIDTH: 0.4 ms
MDC IDC MSMT LEADCHNL RV IMPEDANCE VALUE: 513 Ohm
MDC IDC MSMT LEADCHNL RV SENSING INTR AMPL: 15.375 mV
MDC IDC PG IMPLANT DT: 20170105
MDC IDC SESS DTM: 20191113154403
MDC IDC SET LEADCHNL LV PACING PULSEWIDTH: 0.4 ms
MDC IDC SET LEADCHNL RV PACING AMPLITUDE: 2 V

## 2018-01-30 ENCOUNTER — Other Ambulatory Visit (HOSPITAL_COMMUNITY): Payer: Self-pay | Admitting: Pharmacist

## 2018-01-30 MED ORDER — TORSEMIDE 20 MG PO TABS: 80.0000 mg | ORAL_TABLET | Freq: Every day | ORAL | 6 refills | Status: DC

## 2018-01-31 ENCOUNTER — Telehealth: Payer: Self-pay

## 2018-01-31 DIAGNOSIS — I739 Peripheral vascular disease, unspecified: Secondary | ICD-10-CM

## 2018-01-31 DIAGNOSIS — I6523 Occlusion and stenosis of bilateral carotid arteries: Secondary | ICD-10-CM

## 2018-01-31 NOTE — Telephone Encounter (Signed)
Tests ordered. Message routed to admin for scheduling.

## 2018-01-31 NOTE — Telephone Encounter (Signed)
-----   Message from Sanda Klein, MD sent at 01/23/2018  7:54 AM EST ----- Please schedule for nonurgent carotid and lower extremity arterial duplex studies for previous carotid stenosis/carotid stent and PAD with intermittent claudication respectively  [facility]

## 2018-02-07 ENCOUNTER — Other Ambulatory Visit: Payer: Self-pay | Admitting: Cardiology

## 2018-02-21 ENCOUNTER — Ambulatory Visit (HOSPITAL_COMMUNITY)
Admission: RE | Admit: 2018-02-21 | Payer: Medicare Other | Source: Ambulatory Visit | Attending: Cardiovascular Disease | Admitting: Cardiovascular Disease

## 2018-02-21 ENCOUNTER — Encounter (HOSPITAL_COMMUNITY): Payer: Self-pay | Admitting: Emergency Medicine

## 2018-02-21 ENCOUNTER — Inpatient Hospital Stay (HOSPITAL_COMMUNITY): Admission: RE | Admit: 2018-02-21 | Payer: Medicare Other | Source: Ambulatory Visit

## 2018-02-21 ENCOUNTER — Emergency Department (HOSPITAL_COMMUNITY)
Admission: EM | Admit: 2018-02-21 | Discharge: 2018-02-21 | Disposition: A | Discharge status: Home / Self Care | Payer: Medicare Other | Attending: Emergency Medicine | Admitting: Emergency Medicine

## 2018-02-21 DIAGNOSIS — N182 Chronic kidney disease, stage 2 (mild): Secondary | ICD-10-CM | POA: Diagnosis not present

## 2018-02-21 DIAGNOSIS — Z87890 Personal history of sex reassignment: Secondary | ICD-10-CM | POA: Diagnosis not present

## 2018-02-21 DIAGNOSIS — Z7901 Long term (current) use of anticoagulants: Secondary | ICD-10-CM | POA: Diagnosis not present

## 2018-02-21 DIAGNOSIS — R490 Dysphonia: Secondary | ICD-10-CM | POA: Insufficient documentation

## 2018-02-21 DIAGNOSIS — Z951 Presence of aortocoronary bypass graft: Secondary | ICD-10-CM | POA: Diagnosis not present

## 2018-02-21 DIAGNOSIS — I252 Old myocardial infarction: Secondary | ICD-10-CM | POA: Diagnosis not present

## 2018-02-21 DIAGNOSIS — I2510 Atherosclerotic heart disease of native coronary artery without angina pectoris: Secondary | ICD-10-CM | POA: Insufficient documentation

## 2018-02-21 DIAGNOSIS — I130 Hypertensive heart and chronic kidney disease with heart failure and stage 1 through stage 4 chronic kidney disease, or unspecified chronic kidney disease: Secondary | ICD-10-CM | POA: Diagnosis not present

## 2018-02-21 DIAGNOSIS — I509 Heart failure, unspecified: Secondary | ICD-10-CM | POA: Diagnosis not present

## 2018-02-21 DIAGNOSIS — F1290 Cannabis use, unspecified, uncomplicated: Secondary | ICD-10-CM | POA: Diagnosis not present

## 2018-02-21 DIAGNOSIS — Z95810 Presence of automatic (implantable) cardiac defibrillator: Secondary | ICD-10-CM | POA: Diagnosis not present

## 2018-02-21 DIAGNOSIS — E1122 Type 2 diabetes mellitus with diabetic chronic kidney disease: Secondary | ICD-10-CM | POA: Diagnosis not present

## 2018-02-21 DIAGNOSIS — Z79899 Other long term (current) drug therapy: Secondary | ICD-10-CM | POA: Insufficient documentation

## 2018-02-21 DIAGNOSIS — J029 Acute pharyngitis, unspecified: Secondary | ICD-10-CM | POA: Diagnosis present

## 2018-02-21 NOTE — Discharge Instructions (Signed)
Please follow up with Ear-Nose-Throat doctor
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
## 2018-02-21 NOTE — ED Triage Notes (Signed)
Pt here from home with c/o sore throat time 1 week , congestion , no fevers , pt state that he does smoke everyday

## 2018-02-24 ENCOUNTER — Telehealth (HOSPITAL_COMMUNITY): Payer: Self-pay

## 2018-02-24 NOTE — Telephone Encounter (Signed)
Pt was suppose to have picked up his meds last week, he missed my call last week, I spoke to him today as he needs his meds, I called pharmacy to verify they are still there for him, they had placed one rx back on shelf due to him not being picked up last week.  He said he was going to pick them up today, I asked him to call me today once he got home and I would come out later today to fill pill boxes. I did not hear back from pt, I will try him again tomorrow.   Marylouise Stacks, EMT-Paramedic 02/24/18

## 2018-02-25 ENCOUNTER — Other Ambulatory Visit (HOSPITAL_COMMUNITY): Payer: Self-pay

## 2018-02-25 ENCOUNTER — Telehealth (HOSPITAL_COMMUNITY): Payer: Self-pay

## 2018-02-25 NOTE — Telephone Encounter (Signed)
I called pt again this morning, since I did not get a phone call from him yesterday stating he picked up his meds.  He said he was in [place] this morning and will get his meds when he comes back in [place]. He said he was out of meds, I advised him he needed to pick up his meds so his pill box can be done.  I told him to call me once he returns home with his meds.   Marylouise Stacks, EMT-Paramedic  02/25/18

## 2018-02-25 NOTE — Progress Notes (Signed)
Paramedicine Encounter    Patient ID: [name], male    DOB: [birth year], 72 y.o.   MRN: [id number]   Patient Care Team: Wenda Low, MD as PCP - General (Internal Medicine) Lorretta Harp, MD as Consulting Physician (Cardiology) Jorge Ny, LCSW as Social Worker (Licensed Clinical Social Worker)  Patient Active Problem List   Diagnosis Date Noted  . Biventricular ICD (implantable cardioverter-defibrillator) in place 07/04/2016  . PAD (peripheral artery disease) ([place]) 12/27/2015  . Erectile dysfunction 08/29/2015  . CKD (chronic kidney disease) stage 4, GFR 15-29 ml/min (HCC) 01/25/2015  . Diabetes mellitus due to underlying condition with diabetic chronic kidney disease ([place])   . Chronic kidney disease (CKD), stage IV (severe) ([place]) 11/24/2014  . DM (diabetes mellitus), type 2 with renal complications ([place]) 29/51/8841  . Chronic combined systolic and diastolic CHF (congestive heart failure) ([place]) 07/19/2014  . Hypoxia   . CHF exacerbation ([place]) 12/15/2013  . History of carotid artery stenosis 10/05/2013  . Carotid artery disease ([place]) 09/08/2013  . Hypercholesterolemia 09/08/2013  . CHF (congestive heart failure) ([place]) 06/02/2012  . Essential hypertension 06/02/2012  . At risk for sudden cardiac death Apr 24, 2012  . Cardiomyopathy, ischemic April 24, 2012  . S/P ICD (internal cardiac defibrillator) procedure, 04-24-2012, Medtronic device ro ICM 2012/04/24  . CKD (chronic kidney disease) stage 3, GFR 30-59 ml/min (HCC) 04/03/2012  . COPD (chronic obstructive pulmonary disease) ([place]) 04/03/2012  . Pulmonary edema  04/01/2012  . Chest pain at rest 04/01/2012  . Diabetes mellitus due to underlying condition ([place]) 11/01/2006  . ABUSE, COCAINE, EPISODIC 11/01/2006  . Polysubstance abuse ([place]) 11/01/2006  . MYOCARDIAL INFARCTION, HX OF 11/01/2006  . CAD (coronary artery disease) 11/01/2006    Current Outpatient Medications:  .  allopurinol (ZYLOPRIM) 300 MG tablet, TAKE 1  TABLET BY MOUTH EVERY DAY, Disp: 30 tablet, Rfl: 0 .  amLODipine (NORVASC) 5 MG tablet, TAKE 1 TABLET BY MOUTH EVERY DAY IN THE MORNING, Disp: 90 tablet, Rfl: 3 .  aspirin 81 MG EC tablet, TAKE 1 TABLET (81 MG TOTAL) BY MOUTH DAILY., Disp: 90 tablet, Rfl: 2 .  atorvastatin (LIPITOR) 40 MG tablet, Take 1 tablet (40 mg total) by mouth every morning., Disp: 30 tablet, Rfl: 5 .  carvedilol (COREG) 12.5 MG tablet, TAKE 1 TABLET (12.5 MG TOTAL) BY MOUTH 2 (TWO) TIMES DAILY WITH A MEAL., Disp: 180 tablet, Rfl: 2 .  clopidogrel (PLAVIX) 75 MG tablet, Take 1 tablet (75 mg total) by mouth daily., Disp: 90 tablet, Rfl: 3 .  glimepiride (AMARYL) 1 MG tablet, Take 1 tablet by mouth daily. Reported on 03/10/2015, Disp: , Rfl:  .  hydrALAZINE (APRESOLINE) 100 MG tablet, TAKE 1 TABLET (100 MG TOTAL) BY MOUTH 3 (THREE) TIMES DAILY., Disp: 270 tablet, Rfl: 2 .  isosorbide mononitrate (IMDUR) 60 MG 24 hr tablet, TAKE 1 AND 1/2 TABLET BY MOUTH DAILY, Disp: 135 tablet, Rfl: 1 .  oxyCODONE-acetaminophen (PERCOCET) 10-325 MG tablet, Take 1 tablet by mouth. , Disp: , Rfl: 0 .  potassium chloride SA (K-DUR,KLOR-CON) 20 MEQ tablet, Take 1 tablet (20 mEq total) by mouth daily., Disp: 15 tablet, Rfl: 0 .  torsemide (DEMADEX) 20 MG tablet, Take 4 tablets (80 mg total) by mouth daily., Disp: 120 tablet, Rfl: 6 .  traZODone (DESYREL) 150 MG tablet, Take 150 mg by mouth at bedtime as needed for sleep. , Disp: , Rfl:  No Known Allergies    Social History   Socioeconomic History  . Marital status: Single  Spouse name: Not on file  . Number of children: Not on file  . Years of education: Not on file  . Highest education level: Not on file  Occupational History  . Not on file  Social Needs  . Financial resource strain: Not on file  . Food insecurity:    Worry: Not on file    Inability: Not on file  . Transportation needs:    Medical: Not on file    Non-medical: Not on file  Tobacco Use  . Smoking status: Former  Smoker    Last attempt to quit: 04/18/1982    Years since quitting: 35.8  . Smokeless tobacco: Never Used  Substance and Sexual Activity  . Alcohol use: No  . Drug use: No    Frequency: 7.0 times per week    Types: Marijuana    Comment: stopped three years ago  . Sexual activity: Never  Lifestyle  . Physical activity:    Days per week: Not on file    Minutes per session: Not on file  . Stress: Not on file  Relationships  . Social connections:    Talks on phone: Not on file    Gets together: Not on file    Attends religious service: Not on file    Active member of club or organization: Not on file    Attends meetings of clubs or organizations: Not on file    Relationship status: Not on file  . Intimate partner violence:    Fear of current or ex partner: Not on file    Emotionally abused: Not on file    Physically abused: Not on file    Forced sexual activity: Not on file  Other Topics Concern  . Not on file  Social History Narrative  . Not on file    Physical Exam      Future Appointments  Date Time Provider [place]  03/04/2018  9:00 AM [facility] VASC 1 [facility]  03/04/2018 11:00 AM [facility] VASC 1 [facility]  04/02/2018  8:50 AM [facility] DEVICE REMOTES [facility]    BP (!) 152/60   Pulse 72   Resp 16   Wt 157 lb (71.2 kg)   SpO2 98%   BMI 24.59 kg/m   Weight yesterday-? Last visit weight-167 [facility]-133  Pt called me when he got home with his meds, I came for home visit to fill pill boxes.  He has been a week without his fluid pills. Everything else was in the last pill box except the torsemide.  meds verified and pill box refilled.  I will need to call in several of his meds a week prior to me seeing him again.  According to his scales he is down 10lbs from a month ago.  He does have a slight wheeze to his rt lower lobe area, he recently went to ER for hoarseness, he was referred to ENT , he went and was eval and  nothing further is needed. I did tell him to contact his PCP for further eval of his wheezing. He denies sob, no edema noted. No c/p, no dizziness. B/p elevated however he has not been taking his meds due to him not picking up his meds last week.   Marylouise Stacks, [facility] Paramedic  02/25/18

## 2018-02-26 DIAGNOSIS — Z23 Encounter for immunization: Secondary | ICD-10-CM | POA: Diagnosis not present

## 2018-02-26 DIAGNOSIS — R490 Dysphonia: Secondary | ICD-10-CM | POA: Diagnosis not present

## 2018-03-04 ENCOUNTER — Ambulatory Visit (HOSPITAL_COMMUNITY)
Admission: RE | Admit: 2018-03-04 | Payer: Medicare Other | Source: Ambulatory Visit | Attending: Cardiovascular Disease | Admitting: Cardiovascular Disease

## 2018-03-04 ENCOUNTER — Other Ambulatory Visit (HOSPITAL_COMMUNITY): Payer: Self-pay | Admitting: Cardiology

## 2018-03-04 ENCOUNTER — Ambulatory Visit (HOSPITAL_COMMUNITY)
Admission: RE | Admit: 2018-03-04 | Disposition: A | Discharge status: Home / Self Care | Payer: Medicare Other | Source: Ambulatory Visit | Attending: Cardiovascular Disease | Admitting: Cardiovascular Disease

## 2018-03-04 DIAGNOSIS — R0989 Other specified symptoms and signs involving the circulatory and respiratory systems: Secondary | ICD-10-CM

## 2018-03-24 ENCOUNTER — Telehealth (HOSPITAL_COMMUNITY): Payer: Self-pay

## 2018-03-27 ENCOUNTER — Telehealth (HOSPITAL_COMMUNITY): Payer: Self-pay

## 2018-03-27 NOTE — Telephone Encounter (Signed)
I was able to reach pt today to advise him that meds are ready for pick up--he stated his car was broke down and asked if I could assist to pick them up--will handle that next week.   Marylouise Stacks, EMT-Paramedic  03/27/18

## 2018-03-27 NOTE — Telephone Encounter (Signed)
Tried calling pt without success--ordered 10 of his meds and pharmacy advised it was $45 and ready for pick up.   Marylouise Stacks, EMT-Paramedic  03/27/18

## 2018-04-02 ENCOUNTER — Ambulatory Visit (INDEPENDENT_AMBULATORY_CARE_PROVIDER_SITE_OTHER): Payer: Medicare Other

## 2018-04-02 DIAGNOSIS — I5042 Chronic combined systolic (congestive) and diastolic (congestive) heart failure: Secondary | ICD-10-CM

## 2018-04-02 DIAGNOSIS — I255 Ischemic cardiomyopathy: Secondary | ICD-10-CM

## 2018-04-03 NOTE — Progress Notes (Signed)
Remote ICD transmission.   

## 2018-04-04 ENCOUNTER — Encounter: Payer: Self-pay | Admitting: Cardiology

## 2018-04-06 LAB — CUP PACEART REMOTE DEVICE CHECK
Battery Remaining Longevity: 57 mo
Battery Voltage: 2.97 V
Brady Statistic AP VP Percent: 41.96 %
Brady Statistic AS VP Percent: 56.4 %
Brady Statistic RA Percent Paced: 42.27 %
Brady Statistic RV Percent Paced: 45.88 %
HIGH POWER IMPEDANCE MEASURED VALUE: 66 Ohm
Implantable Lead Implant Date: 20140207
Implantable Lead Implant Date: 20170105
Implantable Lead Location: 753858
Implantable Lead Location: 753859
Implantable Lead Model: 6935
Implantable Pulse Generator Implant Date: 20170105
Lead Channel Impedance Value: 342 Ohm
Lead Channel Impedance Value: 342 Ohm
Lead Channel Impedance Value: 361 Ohm
Lead Channel Impedance Value: 399 Ohm
Lead Channel Impedance Value: 532 Ohm
Lead Channel Impedance Value: 532 Ohm
Lead Channel Impedance Value: 589 Ohm
Lead Channel Pacing Threshold Amplitude: 0.5 V
Lead Channel Pacing Threshold Amplitude: 0.625 V
Lead Channel Pacing Threshold Pulse Width: 0.4 ms
Lead Channel Pacing Threshold Pulse Width: 0.4 ms
Lead Channel Sensing Intrinsic Amplitude: 14.875 mV
Lead Channel Sensing Intrinsic Amplitude: 14.875 mV
Lead Channel Setting Pacing Amplitude: 2.25 V
Lead Channel Setting Pacing Pulse Width: 0.4 ms
MDC IDC LEAD IMPLANT DT: 20140207
MDC IDC LEAD LOCATION: 753860
MDC IDC MSMT LEADCHNL LV IMPEDANCE VALUE: 247 Ohm
MDC IDC MSMT LEADCHNL LV IMPEDANCE VALUE: 456 Ohm
MDC IDC MSMT LEADCHNL LV IMPEDANCE VALUE: 551 Ohm
MDC IDC MSMT LEADCHNL LV IMPEDANCE VALUE: 589 Ohm
MDC IDC MSMT LEADCHNL LV IMPEDANCE VALUE: 665 Ohm
MDC IDC MSMT LEADCHNL LV PACING THRESHOLD AMPLITUDE: 1.125 V
MDC IDC MSMT LEADCHNL RA PACING THRESHOLD PULSEWIDTH: 0.4 ms
MDC IDC MSMT LEADCHNL RA SENSING INTR AMPL: 1 mV
MDC IDC MSMT LEADCHNL RA SENSING INTR AMPL: 1 mV
MDC IDC MSMT LEADCHNL RV IMPEDANCE VALUE: 551 Ohm
MDC IDC SESS DTM: 20200122062304
MDC IDC SET LEADCHNL RA PACING AMPLITUDE: 1.5 V
MDC IDC SET LEADCHNL RV PACING AMPLITUDE: 2 V
MDC IDC SET LEADCHNL RV PACING PULSEWIDTH: 0.4 ms
MDC IDC SET LEADCHNL RV SENSING SENSITIVITY: 0.3 mV
MDC IDC STAT BRADY AP VS PERCENT: 0.6 %
MDC IDC STAT BRADY AS VS PERCENT: 1.04 %

## 2018-04-07 ENCOUNTER — Other Ambulatory Visit (HOSPITAL_COMMUNITY): Payer: Self-pay

## 2018-04-07 NOTE — Progress Notes (Signed)
Paramedicine Encounter    Patient ID: [name], male    DOB: January 27, 1946, 73 y.o.   MRN: [id number]   Patient Care Team: Wenda Low, MD as PCP - General (Internal Medicine) Lorretta Harp, MD as Consulting Physician (Cardiology) Jorge Ny, LCSW as Social Worker (Licensed Clinical Social Worker)  Patient Active Problem List   Diagnosis Date Noted  . Biventricular ICD (implantable cardioverter-defibrillator) in place 07/04/2016  . PAD (peripheral artery disease) ([place]) 12/27/2015  . Erectile dysfunction 08/29/2015  . CKD (chronic kidney disease) stage 4, GFR 15-29 ml/min (HCC) 01/25/2015  . Diabetes mellitus due to underlying condition with diabetic chronic kidney disease ([place])   . Chronic kidney disease (CKD), stage IV (severe) ([place]) 11/24/2014  . DM (diabetes mellitus), type 2 with renal complications ([place]) 67/67/2094  . Chronic combined systolic and diastolic CHF (congestive heart failure) ([place]) 07/19/2014  . Hypoxia   . CHF exacerbation ([place]) 12/15/2013  . History of carotid artery stenosis 10/05/2013  . Carotid artery disease ([place]) 09/08/2013  . Hypercholesterolemia 09/08/2013  . CHF (congestive heart failure) ([place]) 06/02/2012  . Essential hypertension 06/02/2012  . At risk for sudden cardiac death 05/06/2012  . Cardiomyopathy, ischemic May 06, 2012  . S/P ICD (internal cardiac defibrillator) procedure, May 06, 2012, Medtronic device ro ICM 2012/05/06  . CKD (chronic kidney disease) stage 3, GFR 30-59 ml/min (HCC) 04/03/2012  . COPD (chronic obstructive pulmonary disease) ([place]) 04/03/2012  . Pulmonary edema  04/01/2012  . Chest pain at rest 04/01/2012  . Diabetes mellitus due to underlying condition ([place]) 11/01/2006  . ABUSE, COCAINE, EPISODIC 11/01/2006  . Polysubstance abuse ([place]) 11/01/2006  . MYOCARDIAL INFARCTION, HX OF 11/01/2006  . CAD (coronary artery disease) 11/01/2006    Current Outpatient Medications:  .  allopurinol (ZYLOPRIM) 300 MG tablet, TAKE 1  TABLET BY MOUTH EVERY DAY, Disp: 30 tablet, Rfl: 0 .  amLODipine (NORVASC) 5 MG tablet, TAKE 1 TABLET BY MOUTH EVERY DAY IN THE MORNING, Disp: 90 tablet, Rfl: 3 .  aspirin 81 MG EC tablet, TAKE 1 TABLET (81 MG TOTAL) BY MOUTH DAILY., Disp: 90 tablet, Rfl: 2 .  atorvastatin (LIPITOR) 40 MG tablet, TAKE 1 TABLET BY MOUTH EVERY DAY IN THE MORNING, Disp: 90 tablet, Rfl: 1 .  carvedilol (COREG) 12.5 MG tablet, TAKE 1 TABLET (12.5 MG TOTAL) BY MOUTH 2 (TWO) TIMES DAILY WITH A MEAL., Disp: 180 tablet, Rfl: 2 .  clopidogrel (PLAVIX) 75 MG tablet, Take 1 tablet (75 mg total) by mouth daily., Disp: 90 tablet, Rfl: 3 .  glimepiride (AMARYL) 1 MG tablet, Take 1 tablet by mouth daily. Reported on 03/10/2015, Disp: , Rfl:  .  hydrALAZINE (APRESOLINE) 100 MG tablet, TAKE 1 TABLET (100 MG TOTAL) BY MOUTH 3 (THREE) TIMES DAILY., Disp: 270 tablet, Rfl: 2 .  isosorbide mononitrate (IMDUR) 60 MG 24 hr tablet, TAKE 1 AND 1/2 TABLET BY MOUTH DAILY, Disp: 135 tablet, Rfl: 1 .  potassium chloride SA (K-DUR,KLOR-CON) 20 MEQ tablet, Take 1 tablet (20 mEq total) by mouth daily., Disp: 15 tablet, Rfl: 0 .  torsemide (DEMADEX) 20 MG tablet, Take 4 tablets (80 mg total) by mouth daily., Disp: 120 tablet, Rfl: 6 .  traZODone (DESYREL) 150 MG tablet, Take 150 mg by mouth at bedtime as needed for sleep. , Disp: , Rfl:  .  oxyCODONE-acetaminophen (PERCOCET) 10-325 MG tablet, Take 1 tablet by mouth. , Disp: , Rfl: 0 No Known Allergies    Social History   Socioeconomic History  . Marital status: Single  Spouse name: Not on file  . Number of children: Not on file  . Years of education: Not on file  . Highest education level: Not on file  Occupational History  . Not on file  Social Needs  . Financial resource strain: Not on file  . Food insecurity:    Worry: Not on file    Inability: Not on file  . Transportation needs:    Medical: Not on file    Non-medical: Not on file  Tobacco Use  . Smoking status: Former Smoker     Last attempt to quit: 04/18/1982    Years since quitting: 35.9  . Smokeless tobacco: Never Used  Substance and Sexual Activity  . Alcohol use: No  . Drug use: No    Frequency: 7.0 times per week    Types: Marijuana    Comment: stopped three years ago  . Sexual activity: Never  Lifestyle  . Physical activity:    Days per week: Not on file    Minutes per session: Not on file  . Stress: Not on file  Relationships  . Social connections:    Talks on phone: Not on file    Gets together: Not on file    Attends religious service: Not on file    Active member of club or organization: Not on file    Attends meetings of clubs or organizations: Not on file    Relationship status: Not on file  . Intimate partner violence:    Fear of current or ex partner: Not on file    Emotionally abused: Not on file    Physically abused: Not on file    Forced sexual activity: Not on file  Other Topics Concern  . Not on file  Social History Narrative  . Not on file    Physical Exam      Future Appointments  Date Time Provider [place]  07/02/2018 10:10 AM [facility] DEVICE REMOTES [facility]    BP (!) 122/56   Pulse 68   Resp 15   Wt 168 lb (76.2 kg)   SpO2 98%   BMI 26.31 kg/m   Weight yesterday-167 Last visit weight-157--poss incorrect report Weight before last visit-167  Pt reports feeling well, I had to go by and pick up his med for him b/c his car broke down on him and he didn't have any money until the first of feb.  Pt denies any issues, no sob, no cp. I advised him he missed some appointments with dr berry office, I called to resch it last time he no showed-so I told him he had to call to arrange next appoint.  meds verified and 4 pill boxes refilled for him.  Reminded him again to call dr berry office to resch that missed appoint and I will f/u in 4 wks.    Marylouise Stacks, [facility] Paramedic   04/07/18  ACTION: {Paramed Action:[phone]

## 2018-04-22 ENCOUNTER — Other Ambulatory Visit: Payer: Self-pay | Admitting: Cardiology

## 2018-04-22 NOTE — Telephone Encounter (Signed)
Refill should be sent to PCP for this medication

## 2018-05-14 ENCOUNTER — Other Ambulatory Visit (HOSPITAL_COMMUNITY): Payer: Self-pay

## 2018-05-14 NOTE — Progress Notes (Signed)
Paramedicine Encounter    Patient ID: [name], male    DOB: [birth year], 73 y.o.   MRN: [id number]   Patient Care Team: Wenda Low, MD as PCP - General (Internal Medicine) Lorretta Harp, MD as Consulting Physician (Cardiology) Jorge Ny, LCSW as Social Worker (Licensed Clinical Social Worker)  Patient Active Problem List   Diagnosis Date Noted  . Biventricular ICD (implantable cardioverter-defibrillator) in place 07/04/2016  . PAD (peripheral artery disease) ([place]) 12/27/2015  . Erectile dysfunction 08/29/2015  . CKD (chronic kidney disease) stage 4, GFR 15-29 ml/min (HCC) 01/25/2015  . Diabetes mellitus due to underlying condition with diabetic chronic kidney disease ([place])   . Chronic kidney disease (CKD), stage IV (severe) ([place]) 11/24/2014  . DM (diabetes mellitus), type 2 with renal complications ([place]) 65/53/7482  . Chronic combined systolic and diastolic CHF (congestive heart failure) ([place]) 07/19/2014  . Hypoxia   . CHF exacerbation ([place]) 12/15/2013  . History of carotid artery stenosis 10/05/2013  . Carotid artery disease ([place]) 09/08/2013  . Hypercholesterolemia 09/08/2013  . CHF (congestive heart failure) ([place]) 06/02/2012  . Essential hypertension 06/02/2012  . At risk for sudden cardiac death May 02, 2012  . Cardiomyopathy, ischemic 05/02/2012  . S/P ICD (internal cardiac defibrillator) procedure, May 02, 2012, Medtronic device ro ICM 2012/05/02  . CKD (chronic kidney disease) stage 3, GFR 30-59 ml/min (HCC) 04/03/2012  . COPD (chronic obstructive pulmonary disease) ([place]) 04/03/2012  . Pulmonary edema  04/01/2012  . Chest pain at rest 04/01/2012  . Diabetes mellitus due to underlying condition ([place]) 11/01/2006  . ABUSE, COCAINE, EPISODIC 11/01/2006  . Polysubstance abuse ([place]) 11/01/2006  . MYOCARDIAL INFARCTION, HX OF 11/01/2006  . CAD (coronary artery disease) 11/01/2006    Current Outpatient Medications:  .  allopurinol (ZYLOPRIM) 300 MG tablet, TAKE 1  TABLET BY MOUTH EVERY DAY, Disp: 30 tablet, Rfl: 0 .  amLODipine (NORVASC) 5 MG tablet, TAKE 1 TABLET BY MOUTH EVERY DAY IN THE MORNING, Disp: 90 tablet, Rfl: 3 .  aspirin 81 MG EC tablet, TAKE 1 TABLET (81 MG TOTAL) BY MOUTH DAILY., Disp: 90 tablet, Rfl: 2 .  atorvastatin (LIPITOR) 40 MG tablet, TAKE 1 TABLET BY MOUTH EVERY DAY IN THE MORNING, Disp: 90 tablet, Rfl: 1 .  carvedilol (COREG) 12.5 MG tablet, TAKE 1 TABLET (12.5 MG TOTAL) BY MOUTH 2 (TWO) TIMES DAILY WITH A MEAL., Disp: 180 tablet, Rfl: 2 .  clopidogrel (PLAVIX) 75 MG tablet, Take 1 tablet (75 mg total) by mouth daily., Disp: 90 tablet, Rfl: 3 .  glimepiride (AMARYL) 1 MG tablet, Take 1 tablet by mouth daily. Reported on 03/10/2015, Disp: , Rfl:  .  hydrALAZINE (APRESOLINE) 100 MG tablet, TAKE 1 TABLET (100 MG TOTAL) BY MOUTH 3 (THREE) TIMES DAILY., Disp: 270 tablet, Rfl: 2 .  isosorbide mononitrate (IMDUR) 60 MG 24 hr tablet, TAKE 1 AND 1/2 TABLET BY MOUTH DAILY, Disp: 135 tablet, Rfl: 1 .  oxyCODONE-acetaminophen (PERCOCET) 10-325 MG tablet, Take 1 tablet by mouth. , Disp: , Rfl: 0 .  potassium chloride SA (K-DUR,KLOR-CON) 20 MEQ tablet, Take 1 tablet (20 mEq total) by mouth daily., Disp: 15 tablet, Rfl: 0 .  torsemide (DEMADEX) 20 MG tablet, Take 4 tablets (80 mg total) by mouth daily., Disp: 120 tablet, Rfl: 6 .  traZODone (DESYREL) 150 MG tablet, Take 150 mg by mouth at bedtime as needed for sleep. , Disp: , Rfl:  No Known Allergies    Social History   Socioeconomic History  . Marital status: Single  Spouse name: Not on file  . Number of children: Not on file  . Years of education: Not on file  . Highest education level: Not on file  Occupational History  . Not on file  Social Needs  . Financial resource strain: Not on file  . Food insecurity:    Worry: Not on file    Inability: Not on file  . Transportation needs:    Medical: Not on file    Non-medical: Not on file  Tobacco Use  . Smoking status: Former Smoker     Last attempt to quit: 04/18/1982    Years since quitting: 36.0  . Smokeless tobacco: Never Used  Substance and Sexual Activity  . Alcohol use: No  . Drug use: No    Frequency: 7.0 times per week    Types: Marijuana    Comment: stopped three years ago  . Sexual activity: Never  Lifestyle  . Physical activity:    Days per week: Not on file    Minutes per session: Not on file  . Stress: Not on file  Relationships  . Social connections:    Talks on phone: Not on file    Gets together: Not on file    Attends religious service: Not on file    Active member of club or organization: Not on file    Attends meetings of clubs or organizations: Not on file    Relationship status: Not on file  . Intimate partner violence:    Fear of current or ex partner: Not on file    Emotionally abused: Not on file    Physically abused: Not on file    Forced sexual activity: Not on file  Other Topics Concern  . Not on file  Social History Narrative  . Not on file    Physical Exam      Future Appointments  Date Time Provider [place]  07/02/2018 10:10 AM [facility] DEVICE REMOTES [facility]    BP 138/70   Pulse 80   Resp 16   Wt 160 lb (72.6 kg)   SpO2 98%   BMI 25.06 kg/m   Weight yesterday-160 Last visit weight-168 CBG PTA-74  Pt reports feeling ok, he looks very thin, his weight is down 8 lbs from last week. He still has a week worth of meds left--he should be nearing empty on his last week.  He states he is not trying to lose weight. He has good appetite.  Ordered trazadone and torsemide.  Pt denies any sob, no dizziness, no difficulties with walking,  No tiredness, he states he usually eats once a day and its a large meal and it holds him over.  His HR was slightly elevated with irregular beat about 4x a minute-he had just got back home when I arrived and was up and down moving about the house and just took his meds when I got there as well.  He states  he got letter from dr berry office for him to call and schedule appointment- I advised him to be sure to do that ASAP.   Marylouise Stacks, [facility] Paramedic  05/14/18

## 2018-05-20 DIAGNOSIS — I5020 Unspecified systolic (congestive) heart failure: Secondary | ICD-10-CM | POA: Diagnosis not present

## 2018-05-20 DIAGNOSIS — E1122 Type 2 diabetes mellitus with diabetic chronic kidney disease: Secondary | ICD-10-CM | POA: Diagnosis not present

## 2018-05-20 DIAGNOSIS — D696 Thrombocytopenia, unspecified: Secondary | ICD-10-CM | POA: Diagnosis not present

## 2018-05-20 DIAGNOSIS — R69 Illness, unspecified: Secondary | ICD-10-CM | POA: Diagnosis not present

## 2018-05-20 DIAGNOSIS — E782 Mixed hyperlipidemia: Secondary | ICD-10-CM | POA: Diagnosis not present

## 2018-05-20 DIAGNOSIS — I10 Essential (primary) hypertension: Secondary | ICD-10-CM | POA: Diagnosis not present

## 2018-05-20 DIAGNOSIS — E119 Type 2 diabetes mellitus without complications: Secondary | ICD-10-CM | POA: Diagnosis not present

## 2018-05-20 DIAGNOSIS — M109 Gout, unspecified: Secondary | ICD-10-CM | POA: Diagnosis not present

## 2018-05-20 DIAGNOSIS — N184 Chronic kidney disease, stage 4 (severe): Secondary | ICD-10-CM | POA: Diagnosis not present

## 2018-05-29 DIAGNOSIS — R69 Illness, unspecified: Secondary | ICD-10-CM | POA: Diagnosis not present

## 2018-06-02 ENCOUNTER — Telehealth (HOSPITAL_COMMUNITY): Payer: Self-pay | Admitting: Licensed Clinical Social Worker

## 2018-06-02 NOTE — Telephone Encounter (Signed)
CSW reached out to pt to check in regarding food and medication status at this time. Pt states he has no concerns at this time.  CSW encouraged pt to reach out with any concerns and will continue to follow and assist as needed  Jorge Ny, [place] Worker [facility] [phone]

## 2018-06-18 ENCOUNTER — Telehealth (HOSPITAL_COMMUNITY): Payer: Self-pay | Admitting: *Deleted

## 2018-06-18 ENCOUNTER — Other Ambulatory Visit (HOSPITAL_COMMUNITY): Payer: Self-pay

## 2018-06-18 NOTE — Telephone Encounter (Signed)
Eliezer Lofts CSW was contacted b/c pt's copay for Torsemide is $300 and pt can not afford.  Pt did get a new insurance card 04/12/2018, [employer], ID # [id number]  Attempted to do a PA but CMM states it is covered med and no PA is required.  Completed a tier exception via CMM will await response

## 2018-06-18 NOTE — Progress Notes (Signed)
Paramedicine Encounter    Patient ID: [name], male    DOB: Mar 21, 1945, 73 y.o.   MRN: [id number]   Patient Care Team: Wenda Low, MD as PCP - General (Internal Medicine) Lorretta Harp, MD as Consulting Physician (Cardiology) Jorge Ny, LCSW as Social Worker (Licensed Clinical Social Worker)  Patient Active Problem List   Diagnosis Date Noted  . Biventricular ICD (implantable cardioverter-defibrillator) in place 07/04/2016  . PAD (peripheral artery disease) ([place]) 12/27/2015  . Erectile dysfunction 08/29/2015  . CKD (chronic kidney disease) stage 4, GFR 15-29 ml/min (HCC) 01/25/2015  . Diabetes mellitus due to underlying condition with diabetic chronic kidney disease ([place])   . Chronic kidney disease (CKD), stage IV (severe) ([place]) 11/24/2014  . DM (diabetes mellitus), type 2 with renal complications ([place]) 62/22/9798  . Chronic combined systolic and diastolic CHF (congestive heart failure) ([place]) 07/19/2014  . Hypoxia   . CHF exacerbation ([place]) 12/15/2013  . History of carotid artery stenosis 10/05/2013  . Carotid artery disease ([place]) 09/08/2013  . Hypercholesterolemia 09/08/2013  . CHF (congestive heart failure) ([place]) 06/02/2012  . Essential hypertension 06/02/2012  . At risk for sudden cardiac death 04/30/2012  . Cardiomyopathy, ischemic 04/30/12  . S/P ICD (internal cardiac defibrillator) procedure, 04-30-2012, Medtronic device ro ICM Apr 30, 2012  . CKD (chronic kidney disease) stage 3, GFR 30-59 ml/min (HCC) 04/03/2012  . COPD (chronic obstructive pulmonary disease) ([place]) 04/03/2012  . Pulmonary edema  04/01/2012  . Chest pain at rest 04/01/2012  . Diabetes mellitus due to underlying condition ([place]) 11/01/2006  . ABUSE, COCAINE, EPISODIC 11/01/2006  . Polysubstance abuse ([place]) 11/01/2006  . MYOCARDIAL INFARCTION, HX OF 11/01/2006  . CAD (coronary artery disease) 11/01/2006    Current Outpatient Medications:  .  amLODipine (NORVASC) 5 MG tablet, TAKE 1 TABLET BY  MOUTH EVERY DAY IN THE MORNING, Disp: 90 tablet, Rfl: 3 .  aspirin 81 MG EC tablet, TAKE 1 TABLET (81 MG TOTAL) BY MOUTH DAILY., Disp: 90 tablet, Rfl: 2 .  atorvastatin (LIPITOR) 40 MG tablet, TAKE 1 TABLET BY MOUTH EVERY DAY IN THE MORNING, Disp: 90 tablet, Rfl: 1 .  carvedilol (COREG) 12.5 MG tablet, TAKE 1 TABLET (12.5 MG TOTAL) BY MOUTH 2 (TWO) TIMES DAILY WITH A MEAL., Disp: 180 tablet, Rfl: 2 .  clopidogrel (PLAVIX) 75 MG tablet, Take 1 tablet (75 mg total) by mouth daily., Disp: 90 tablet, Rfl: 3 .  glimepiride (AMARYL) 1 MG tablet, Take 1 tablet by mouth daily. Reported on 03/10/2015, Disp: , Rfl:  .  hydrALAZINE (APRESOLINE) 100 MG tablet, TAKE 1 TABLET (100 MG TOTAL) BY MOUTH 3 (THREE) TIMES DAILY., Disp: 270 tablet, Rfl: 2 .  isosorbide mononitrate (IMDUR) 60 MG 24 hr tablet, TAKE 1 AND 1/2 TABLET BY MOUTH DAILY, Disp: 135 tablet, Rfl: 1 .  potassium chloride SA (K-DUR,KLOR-CON) 20 MEQ tablet, Take 1 tablet (20 mEq total) by mouth daily., Disp: 15 tablet, Rfl: 0 .  torsemide (DEMADEX) 20 MG tablet, Take 4 tablets (80 mg total) by mouth daily., Disp: 120 tablet, Rfl: 6 .  allopurinol (ZYLOPRIM) 300 MG tablet, TAKE 1 TABLET BY MOUTH EVERY DAY (Patient not taking: Reported on 06/18/2018), Disp: 30 tablet, Rfl: 0 .  oxyCODONE-acetaminophen (PERCOCET) 10-325 MG tablet, Take 1 tablet by mouth. , Disp: , Rfl: 0 .  traZODone (DESYREL) 150 MG tablet, Take 150 mg by mouth at bedtime as needed for sleep. , Disp: , Rfl:  No Known Allergies    Social History   Socioeconomic History  .  Marital status: Single    Spouse name: Not on file  . Number of children: Not on file  . Years of education: Not on file  . Highest education level: Not on file  Occupational History  . Not on file  Social Needs  . Financial resource strain: Not on file  . Food insecurity:    Worry: Not on file    Inability: Not on file  . Transportation needs:    Medical: Not on file    Non-medical: Not on file  Tobacco  Use  . Smoking status: Former Smoker    Last attempt to quit: 04/18/1982    Years since quitting: 36.1  . Smokeless tobacco: Never Used  Substance and Sexual Activity  . Alcohol use: No  . Drug use: No    Frequency: 7.0 times per week    Types: Marijuana    Comment: stopped three years ago  . Sexual activity: Never  Lifestyle  . Physical activity:    Days per week: Not on file    Minutes per session: Not on file  . Stress: Not on file  Relationships  . Social connections:    Talks on phone: Not on file    Gets together: Not on file    Attends religious service: Not on file    Active member of club or organization: Not on file    Attends meetings of clubs or organizations: Not on file    Relationship status: Not on file  . Intimate partner violence:    Fear of current or ex partner: Not on file    Emotionally abused: Not on file    Physically abused: Not on file    Forced sexual activity: Not on file  Other Topics Concern  . Not on file  Social History Narrative  . Not on file    Physical Exam      Future Appointments  Date Time Provider [place]  07/02/2018 10:10 AM [facility] DEVICE REMOTES [facility]    BP 118/60   Pulse 60   Temp 98.4 F (36.9 C)   Resp 15   SpO2 98%   Weight yesterday-? Last visit weight-160 CBG EMS-102   Pt reports he is feeling ok, pt denies sob, no dizziness, no c/p.  He is at hotel right now due to him getting behind on his other apt due to drug use. Pt states he has been here alone and has not smoked any more marijuana in several wks. He is trying to get back to that apt. His sister has been bringing him foods and he is doing ok.  meds verified and pill box refilled. Was only able to fill 3 pill boxes due to him being out of torsemide. Last time we tried to get it refilled it was very expensive--will work out a solution for that.  He needs refills on torsemide, trazadone, allopurinol.  In a hotel right  now--unable to weigh and unable to check CBG.    Marylouise Stacks, [facility] Paramedic  06/18/18

## 2018-06-19 ENCOUNTER — Telehealth (HOSPITAL_COMMUNITY): Payer: Self-pay | Admitting: Licensed Clinical Social Worker

## 2018-06-19 NOTE — Telephone Encounter (Signed)
CSW received call from pt community paramedic yesterday requesting assistance with getting pt toresimide as it is currently $133 through insurance for a 90 day supply.  [employer] currently closed so unable to help with copay cost through grant.  CSW requested tier exception be completed by RN in clinic- tier exception approved.  CSW called pharmacy and now copay showing as $0- pts community paramedic updated regarding new copay cost.  CSW will continue to follow and assist as needed.  Jorge Ny, LCSW Clinical Social Worker [facility] Desk#: [phone] Cell#: [phone]

## 2018-06-19 NOTE — Telephone Encounter (Signed)
Tier exception for Torsemide was approved through [employer] until 03/12/2019.  Pharmacy is aware and cost is now $0

## 2018-06-24 ENCOUNTER — Telehealth (HOSPITAL_COMMUNITY): Payer: Self-pay

## 2018-06-24 ENCOUNTER — Other Ambulatory Visit: Payer: Self-pay | Admitting: Cardiology

## 2018-06-24 NOTE — Telephone Encounter (Signed)
I called pharmacy to see if patients trazadone and allopurinol was ready for pick up. The trazadone is and is $0 and pharmacy is sending over refill request to his PCP for the allopurinol.   Marylouise Stacks, EMT-Paramedic  06/24/18

## 2018-06-25 ENCOUNTER — Other Ambulatory Visit: Payer: Self-pay | Admitting: Cardiology

## 2018-07-02 ENCOUNTER — Other Ambulatory Visit: Payer: Self-pay

## 2018-07-02 ENCOUNTER — Encounter: Payer: Medicare HMO | Admitting: *Deleted

## 2018-07-03 ENCOUNTER — Telehealth: Payer: Self-pay

## 2018-07-03 ENCOUNTER — Telehealth (HOSPITAL_COMMUNITY): Payer: Self-pay | Admitting: Licensed Clinical Social Worker

## 2018-07-03 NOTE — Telephone Encounter (Signed)
Spoke with patient to remind of missed remote transmission 

## 2018-07-03 NOTE — Telephone Encounter (Signed)
CSW reached out to pt to check in regarding food and medication status at this time. Pt reports he has everything he needs at this time.  Pt lives alone but is keeping in contact with others and does not feel socially isolated at this time.   CSW encouraged pt to reach out with any concerns and will continue to follow and assist as needed  Jorge Ny, [place] Worker [facility] [phone]

## 2018-07-08 ENCOUNTER — Telehealth (HOSPITAL_COMMUNITY): Payer: Self-pay

## 2018-07-08 ENCOUNTER — Other Ambulatory Visit: Payer: Self-pay | Admitting: Cardiology

## 2018-07-08 ENCOUNTER — Telehealth (HOSPITAL_COMMUNITY): Payer: Self-pay | Admitting: Licensed Clinical Social Worker

## 2018-07-08 NOTE — Telephone Encounter (Signed)
Attempted to reach pt regarding med p/u at pharmacy. No answer.   Marylouise Stacks, EMT 07/08/18

## 2018-07-08 NOTE — Telephone Encounter (Signed)
CSW informed by community paramedic that pt is out of allopurinol and that he is having high copay for his potassium ($45/month).  CSW called pharmacy to discuss.  Allopurinol prescribed through pt PCP so CSW had pharmacy send refill request to PCP office.  Pharmacy unable to identify why patient potassium now $45- likely due to pt being in the donut hole with insurance.  No assistance programs available for potassium.  CSW will continue to follow and assist as needed  Jorge Ny, [facility] Desk#: [phone] Cell#: [phone]

## 2018-07-09 ENCOUNTER — Telehealth (HOSPITAL_COMMUNITY): Payer: Self-pay

## 2018-07-09 NOTE — Telephone Encounter (Signed)
Contacted pt this week regarding his meds and that they are ready for pick up. He did not answer and did not call me back.   Marylouise Stacks, EMT-Paramedic  07/09/18

## 2018-07-10 ENCOUNTER — Encounter: Payer: Self-pay | Admitting: Cardiology

## 2018-07-14 ENCOUNTER — Telehealth (HOSPITAL_COMMUNITY): Payer: Self-pay

## 2018-07-14 NOTE — Telephone Encounter (Signed)
Attempted to reach pt regarding his need to pick up his refills at pharmacy. He did not answer.  Last time I spoke to him he said his living arrangements were fixing to change. No answer today.    Marylouise Stacks, EMT-Paramedic 07/14/18

## 2018-07-16 ENCOUNTER — Telehealth (HOSPITAL_COMMUNITY): Payer: Self-pay

## 2018-07-16 NOTE — Telephone Encounter (Signed)
Attempted to contact pt regarding home visit and his meds refilled. He did not answer and was not able to leave a VM. I spoke to him last week to confirm a visit for this week was needed and he said he was on his last week of pill box. He also said he was not going to be at the hotel anymore but was not able to tell me exactly where he will be. But I am not able to reach him.   Marylouise Stacks, EMT-Paramedic  07/16/18

## 2018-07-17 ENCOUNTER — Telehealth (HOSPITAL_COMMUNITY): Payer: Self-pay

## 2018-07-17 NOTE — Telephone Encounter (Signed)
Pt called me very early this morning and left a message that he was staying at the motel [address] now but left no other information. When I called him back he did not answer.   Marylouise Stacks, EMT-Paramedic  07/17/18

## 2018-07-21 ENCOUNTER — Other Ambulatory Visit (HOSPITAL_COMMUNITY): Payer: Self-pay

## 2018-07-21 ENCOUNTER — Telehealth (HOSPITAL_COMMUNITY): Payer: Self-pay | Admitting: Licensed Clinical Social Worker

## 2018-07-21 NOTE — Telephone Encounter (Signed)
CSW called pt to discuss current outstanding Warehouse manager.  Pt does not know account number so CSW called [employer] and was able to find out that pt owes $614.24 total with $564.30 of that already considered past due- next payment is due on May 15th.  CSW called pt to inform of outstanding bill.  CSW provided pt with number to [employer] to see if they can assist with some of the bill.  Pt also reports that he believes he can pay a portion of the bill as well.  CSW will continue to follow and assist as needed  Jorge Ny, [facility] Desk#: [phone] Cell#: [phone]

## 2018-07-21 NOTE — Progress Notes (Signed)
Telephone encounter--- Pt called me back today, he has not picked up his meds yet, I advised him I would call them all back in and let him know how much it is-it was $65. I advised him to pick it up tomor and once that is done then I will come there to place in pill boxes.  I spoke to his sister who advised that he is staying with her during day, she is supporting him and providing food and he is staying in his car at night.  She is also going to help him pay for his meds.   Marylouise Stacks, EMT-Paramedic 07/21/18

## 2018-07-24 ENCOUNTER — Other Ambulatory Visit (HOSPITAL_COMMUNITY): Payer: Self-pay

## 2018-07-24 ENCOUNTER — Telehealth (HOSPITAL_COMMUNITY): Payer: Self-pay | Admitting: Licensed Clinical Social Worker

## 2018-07-24 NOTE — Telephone Encounter (Signed)
CSW received call from pt to inform CSW that he spoke to [employer] and was informed they would be unable to help with his outstanding light bill.  Pt states he can pay about $250 on his bill currently.  CSW encouraged pt to reach out to [employer] to discuss and pay what he is able to then we would evaluate how we could assist.  Pt then informed CSW that he has relapsed on cocaine and is interested in inpatient rehab options.  CSW called rehabs throughout the state but no rehab able to accept pt insurance carrier.  Found one inpatient rehab facility in [place], [place], that can accept pt regardless of insurance but it is a 2 year work program- pt not interested.  Only available options for pt that accept his insurance are out of state- pt not willing to go out of state for rehab at this time.    CSW continuing to refer to outpatient program options and will follow up with pt regarding options.  Jorge Ny, LCSW Clinical Social Worker [facility] Desk#: [phone] Cell#: [phone]

## 2018-07-24 NOTE — Progress Notes (Signed)
Paramedicine Encounter    Patient ID: [name], male    DOB: 07-01-1945, 73 y.o.   MRN: [id number]   Patient Care Team: Wenda Low, MD as PCP - General (Internal Medicine) Lorretta Harp, MD as Consulting Physician (Cardiology) Jorge Ny, LCSW as Social Worker (Licensed Clinical Social Worker)  Patient Active Problem List   Diagnosis Date Noted  . Biventricular ICD (implantable cardioverter-defibrillator) in place 07/04/2016  . PAD (peripheral artery disease) ([place]) 12/27/2015  . Erectile dysfunction 08/29/2015  . CKD (chronic kidney disease) stage 4, GFR 15-29 ml/min (HCC) 01/25/2015  . Diabetes mellitus due to underlying condition with diabetic chronic kidney disease ([place])   . Chronic kidney disease (CKD), stage IV (severe) ([place]) 11/24/2014  . DM (diabetes mellitus), type 2 with renal complications ([place]) 76/16/0737  . Chronic combined systolic and diastolic CHF (congestive heart failure) ([place]) 07/19/2014  . Hypoxia   . CHF exacerbation ([place]) 12/15/2013  . History of carotid artery stenosis 10/05/2013  . Carotid artery disease ([place]) 09/08/2013  . Hypercholesterolemia 09/08/2013  . CHF (congestive heart failure) ([place]) 06/02/2012  . Essential hypertension 06/02/2012  . At risk for sudden cardiac death 2012-04-20  . Cardiomyopathy, ischemic 20-Apr-2012  . S/P ICD (internal cardiac defibrillator) procedure, 04/20/12, Medtronic device ro ICM 20-Apr-2012  . CKD (chronic kidney disease) stage 3, GFR 30-59 ml/min (HCC) 04/03/2012  . COPD (chronic obstructive pulmonary disease) ([place]) 04/03/2012  . Pulmonary edema  04/01/2012  . Chest pain at rest 04/01/2012  . Diabetes mellitus due to underlying condition ([place]) 11/01/2006  . ABUSE, COCAINE, EPISODIC 11/01/2006  . Polysubstance abuse ([place]) 11/01/2006  . MYOCARDIAL INFARCTION, HX OF 11/01/2006  . CAD (coronary artery disease) 11/01/2006    Current Outpatient Medications:  .  amLODipine (NORVASC) 5 MG tablet, TAKE 1 TABLET BY  MOUTH EVERY DAY IN THE MORNING, Disp: 90 tablet, Rfl: 3 .  aspirin 81 MG EC tablet, TAKE 1 TABLET (81 MG TOTAL) BY MOUTH DAILY., Disp: 90 tablet, Rfl: 2 .  atorvastatin (LIPITOR) 40 MG tablet, TAKE 1 TABLET BY MOUTH EVERY DAY IN THE MORNING, Disp: 90 tablet, Rfl: 1 .  carvedilol (COREG) 12.5 MG tablet, TAKE 1 TABLET (12.5 MG TOTAL) BY MOUTH 2 (TWO) TIMES DAILY WITH A MEAL., Disp: 180 tablet, Rfl: 2 .  clopidogrel (PLAVIX) 75 MG tablet, Take 1 tablet (75 mg total) by mouth daily., Disp: 90 tablet, Rfl: 3 .  glimepiride (AMARYL) 1 MG tablet, Take 1 tablet by mouth daily. Reported on 03/10/2015, Disp: , Rfl:  .  hydrALAZINE (APRESOLINE) 100 MG tablet, TAKE 1 TABLET (100 MG TOTAL) BY MOUTH 3 (THREE) TIMES DAILY., Disp: 270 tablet, Rfl: 2 .  isosorbide mononitrate (IMDUR) 60 MG 24 hr tablet, TAKE 1 AND 1/2 TABLET BY MOUTH DAILY, Disp: 135 tablet, Rfl: 1 .  potassium chloride SA (K-DUR,KLOR-CON) 20 MEQ tablet, Take 1 tablet (20 mEq total) by mouth daily., Disp: 15 tablet, Rfl: 0 .  torsemide (DEMADEX) 20 MG tablet, Take 4 tablets (80 mg total) by mouth daily., Disp: 120 tablet, Rfl: 6 .  traZODone (DESYREL) 150 MG tablet, Take 150 mg by mouth at bedtime as needed for sleep. , Disp: , Rfl:  .  allopurinol (ZYLOPRIM) 300 MG tablet, TAKE 1 TABLET BY MOUTH EVERY DAY (Patient not taking: Reported on 06/18/2018), Disp: 30 tablet, Rfl: 0 .  oxyCODONE-acetaminophen (PERCOCET) 10-325 MG tablet, Take 1 tablet by mouth. , Disp: , Rfl: 0 No Known Allergies    Social History   Socioeconomic History  .  Marital status: Single    Spouse name: Not on file  . Number of children: Not on file  . Years of education: Not on file  . Highest education level: Not on file  Occupational History  . Not on file  Social Needs  . Financial resource strain: Not on file  . Food insecurity:    Worry: Not on file    Inability: Not on file  . Transportation needs:    Medical: Not on file    Non-medical: Not on file  Tobacco  Use  . Smoking status: Former Smoker    Last attempt to quit: 04/18/1982    Years since quitting: 36.2  . Smokeless tobacco: Never Used  Substance and Sexual Activity  . Alcohol use: No  . Drug use: No    Frequency: 7.0 times per week    Types: Marijuana    Comment: stopped three years ago  . Sexual activity: Never  Lifestyle  . Physical activity:    Days per week: Not on file    Minutes per session: Not on file  . Stress: Not on file  Relationships  . Social connections:    Talks on phone: Not on file    Gets together: Not on file    Attends religious service: Not on file    Active member of club or organization: Not on file    Attends meetings of clubs or organizations: Not on file    Relationship status: Not on file  . Intimate partner violence:    Fear of current or ex partner: Not on file    Emotionally abused: Not on file    Physically abused: Not on file    Forced sexual activity: Not on file  Other Topics Concern  . Not on file  Social History Narrative  . Not on file    Physical Exam      No future appointments.  BP 134/60   Pulse 82   Temp 98.2 F (36.8 C)   Resp 15   SpO2 97%   Weight yesterday-?? Last visit weight-??  Pt is living with his sister now, he is unable to weigh daily now. She is letting him stay there but he is sleeping in his car. He has returned to drug use, this time he has been using crack cocaine. He states he has not used in 2wks.  I advised him to not use any drugs while on these meds.  He is unable to get any assistance with his light bill-no funds available. He is 3 months behind on his rent and his landlord will not take him back until it gets paid. I told him to call that landlord and see if there is any deal they can work out. He was also given the number to program of the partners ending homelessness to see if there is anything that can be offered to him.   Clopidogrel, isoso will be needed by next visit,  I filled 4 pill  boxes for him. Will continue to f/u.   Marylouise Stacks, [facility] Paramedic  07/24/18

## 2018-07-25 ENCOUNTER — Telehealth (HOSPITAL_COMMUNITY): Payer: Self-pay | Admitting: Licensed Clinical Social Worker

## 2018-07-25 NOTE — Telephone Encounter (Signed)
CSW calling multiple outpatient drug rehab programs to discuss current functioning during virus.  [facility] is still accepting new patients and would be able to see the patient at 12pm on Monday for an intake appointment- patient agreeable.  CSW will continue to follow and assist as needed  Jorge Ny, [facility] Desk#: [phone] Cell#: [phone]

## 2018-07-25 NOTE — Telephone Encounter (Signed)
Entered in error

## 2018-07-28 ENCOUNTER — Telehealth (HOSPITAL_COMMUNITY): Payer: Self-pay | Admitting: Licensed Clinical Social Worker

## 2018-07-28 DIAGNOSIS — R69 Illness, unspecified: Secondary | ICD-10-CM | POA: Diagnosis not present

## 2018-07-28 NOTE — Telephone Encounter (Signed)
CSW called pt to remind of substance abuse counseling appt at 12pm today- pt acknowledges the appt and states he is still planning to attend.  CSW will continue to follow and assist as needed  Jorge Ny, [facility] Desk#: [phone] Cell#: [phone]

## 2018-08-01 DIAGNOSIS — R69 Illness, unspecified: Secondary | ICD-10-CM | POA: Diagnosis not present

## 2018-08-03 DIAGNOSIS — R69 Illness, unspecified: Secondary | ICD-10-CM | POA: Diagnosis not present

## 2018-08-04 DIAGNOSIS — R69 Illness, unspecified: Secondary | ICD-10-CM | POA: Diagnosis not present

## 2018-08-05 DIAGNOSIS — R69 Illness, unspecified: Secondary | ICD-10-CM | POA: Diagnosis not present

## 2018-08-06 DIAGNOSIS — R69 Illness, unspecified: Secondary | ICD-10-CM | POA: Diagnosis not present

## 2018-08-06 DIAGNOSIS — F1110 Opioid abuse, uncomplicated: Secondary | ICD-10-CM | POA: Diagnosis not present

## 2018-08-06 DIAGNOSIS — F1220 Cannabis dependence, uncomplicated: Secondary | ICD-10-CM | POA: Diagnosis not present

## 2018-08-08 ENCOUNTER — Telehealth (HOSPITAL_COMMUNITY): Payer: Self-pay | Admitting: Licensed Clinical Social Worker

## 2018-08-08 DIAGNOSIS — R69 Illness, unspecified: Secondary | ICD-10-CM | POA: Diagnosis not present

## 2018-08-08 NOTE — Telephone Encounter (Signed)
CSW reached out to pt to check in regarding food and medication status at this time. Pt reports he is doing well and has everything he needs at this time.  Pt states he has spoken with his land lord regarding his overdue rent (states this amounts to about $1500) and is working on paying it off- landlord reportedly will let him back in once he has paid the rent that is owed.  Patient also working on paying off his past light bill.  CSW encouraged him to keep the clinic updated with how this is going and we could try once he gets closer to his goal.  In the meantime the patient reports he is staying with his sister and that there are no concerns about him remaining there while he continues to work on paying off what he owes.  CSW also followed up regarding his app[facility] for [facility]- pt reports [facility] is [facility] right now after another appt and has been there 4-5 times since his initial appt.  CSW encouraged pt to reach out with any concerns and will continue to follow and assist as needed  [name], [place] Worker [facility] [phone]

## 2018-08-11 DIAGNOSIS — F1110 Opioid abuse, uncomplicated: Secondary | ICD-10-CM | POA: Diagnosis not present

## 2018-08-11 DIAGNOSIS — F1220 Cannabis dependence, uncomplicated: Secondary | ICD-10-CM | POA: Diagnosis not present

## 2018-08-11 DIAGNOSIS — R69 Illness, unspecified: Secondary | ICD-10-CM | POA: Diagnosis not present

## 2018-08-25 ENCOUNTER — Telehealth (HOSPITAL_COMMUNITY): Payer: Self-pay

## 2018-08-25 NOTE — Telephone Encounter (Signed)
I called pt to sch appointment,i seen him 4 wks ago and filled 4 pill boxes at that time. He states he does not need it filled this week, but he does need trazadone filled. I told him to call me when he is down to a week left. So apparently he isnt taking all of his meds.  Called in trazadone for him.   Marylouise Stacks, EMT-Paramedic  08/25/18

## 2018-09-08 ENCOUNTER — Telehealth (HOSPITAL_COMMUNITY): Payer: Self-pay

## 2018-09-08 NOTE — Telephone Encounter (Signed)
Finally was able to get with pt in regards to his refills-he said he would go get it--he reports he is moving in a rooming house at first of the month and will call me once he gets the address and I will come back out.   Marylouise Stacks, EMT-Paramedic 09/08/18

## 2018-09-15 ENCOUNTER — Telehealth (HOSPITAL_COMMUNITY): Payer: Self-pay

## 2018-09-15 NOTE — Telephone Encounter (Signed)
Contacted pt regarding home visit, we scheduled it for tomor, he states he has now changed locations and will call me back with the address.   Marylouise Stacks, EMT-Paramedic  09/15/18

## 2018-09-16 ENCOUNTER — Other Ambulatory Visit (HOSPITAL_COMMUNITY): Payer: Self-pay

## 2018-09-16 NOTE — Progress Notes (Signed)
Paramedicine Encounter    Patient ID: [name], male    DOB: 02-03-1946, 73 y.o.   MRN: [id number]   Patient Care Team: Wenda Low, MD as PCP - General (Internal Medicine) Lorretta Harp, MD as Consulting Physician (Cardiology) Jorge Ny, LCSW as Social Worker (Licensed Clinical Social Worker)  Patient Active Problem List   Diagnosis Date Noted  . Biventricular ICD (implantable cardioverter-defibrillator) in place 07/04/2016  . PAD (peripheral artery disease) ([place]) 12/27/2015  . Erectile dysfunction 08/29/2015  . CKD (chronic kidney disease) stage 4, GFR 15-29 ml/min (HCC) 01/25/2015  . Diabetes mellitus due to underlying condition with diabetic chronic kidney disease ([place])   . Chronic kidney disease (CKD), stage IV (severe) ([place]) 11/24/2014  . DM (diabetes mellitus), type 2 with renal complications ([place]) 02/54/2706  . Chronic combined systolic and diastolic CHF (congestive heart failure) ([place]) 07/19/2014  . Hypoxia   . CHF exacerbation ([place]) 12/15/2013  . History of carotid artery stenosis 10/05/2013  . Carotid artery disease ([place]) 09/08/2013  . Hypercholesterolemia 09/08/2013  . CHF (congestive heart failure) ([place]) 06/02/2012  . Essential hypertension 06/02/2012  . At risk for sudden cardiac death 08-May-2012  . Cardiomyopathy, ischemic 08-May-2012  . S/P ICD (internal cardiac defibrillator) procedure, 2012-05-08, Medtronic device ro ICM 08-May-2012  . CKD (chronic kidney disease) stage 3, GFR 30-59 ml/min (HCC) 04/03/2012  . COPD (chronic obstructive pulmonary disease) ([place]) 04/03/2012  . Pulmonary edema  04/01/2012  . Chest pain at rest 04/01/2012  . Diabetes mellitus due to underlying condition ([place]) 11/01/2006  . ABUSE, COCAINE, EPISODIC 11/01/2006  . Polysubstance abuse ([place]) 11/01/2006  . MYOCARDIAL INFARCTION, HX OF 11/01/2006  . CAD (coronary artery disease) 11/01/2006    Current Outpatient Medications:  .  allopurinol (ZYLOPRIM) 300 MG tablet, TAKE 1  TABLET BY MOUTH EVERY DAY (Patient not taking: Reported on 06/18/2018), Disp: 30 tablet, Rfl: 0 .  amLODipine (NORVASC) 5 MG tablet, TAKE 1 TABLET BY MOUTH EVERY DAY IN THE MORNING, Disp: 90 tablet, Rfl: 3 .  aspirin 81 MG EC tablet, TAKE 1 TABLET (81 MG TOTAL) BY MOUTH DAILY., Disp: 90 tablet, Rfl: 2 .  atorvastatin (LIPITOR) 40 MG tablet, TAKE 1 TABLET BY MOUTH EVERY DAY IN THE MORNING, Disp: 90 tablet, Rfl: 1 .  carvedilol (COREG) 12.5 MG tablet, TAKE 1 TABLET (12.5 MG TOTAL) BY MOUTH 2 (TWO) TIMES DAILY WITH A MEAL., Disp: 180 tablet, Rfl: 2 .  clopidogrel (PLAVIX) 75 MG tablet, Take 1 tablet (75 mg total) by mouth daily., Disp: 90 tablet, Rfl: 3 .  glimepiride (AMARYL) 1 MG tablet, Take 1 tablet by mouth daily. Reported on 03/10/2015, Disp: , Rfl:  .  hydrALAZINE (APRESOLINE) 100 MG tablet, TAKE 1 TABLET (100 MG TOTAL) BY MOUTH 3 (THREE) TIMES DAILY., Disp: 270 tablet, Rfl: 2 .  isosorbide mononitrate (IMDUR) 60 MG 24 hr tablet, TAKE 1 AND 1/2 TABLET BY MOUTH DAILY, Disp: 135 tablet, Rfl: 1 .  oxyCODONE-acetaminophen (PERCOCET) 10-325 MG tablet, Take 1 tablet by mouth. , Disp: , Rfl: 0 .  potassium chloride SA (K-DUR,KLOR-CON) 20 MEQ tablet, Take 1 tablet (20 mEq total) by mouth daily., Disp: 15 tablet, Rfl: 0 .  torsemide (DEMADEX) 20 MG tablet, Take 4 tablets (80 mg total) by mouth daily., Disp: 120 tablet, Rfl: 6 .  traZODone (DESYREL) 150 MG tablet, Take 150 mg by mouth at bedtime as needed for sleep. , Disp: , Rfl:  No Known Allergies    Social History   Socioeconomic History  .  Marital status: Single    Spouse name: Not on file  . Number of children: Not on file  . Years of education: Not on file  . Highest education level: Not on file  Occupational History  . Not on file  Social Needs  . Financial resource strain: Not on file  . Food insecurity    Worry: Not on file    Inability: Not on file  . Transportation needs    Medical: Not on file    Non-medical: Not on file   Tobacco Use  . Smoking status: Former Smoker    Quit date: 04/18/1982    Years since quitting: 36.4  . Smokeless tobacco: Never Used  Substance and Sexual Activity  . Alcohol use: No  . Drug use: No    Frequency: 7.0 times per week    Types: Marijuana    Comment: stopped three years ago  . Sexual activity: Never  Lifestyle  . Physical activity    Days per week: Not on file    Minutes per session: Not on file  . Stress: Not on file  Relationships  . Social Herbalist on phone: Not on file    Gets together: Not on file    Attends religious service: Not on file    Active member of club or organization: Not on file    Attends meetings of clubs or organizations: Not on file    Relationship status: Not on file  . Intimate partner violence    Fear of current or ex partner: Not on file    Emotionally abused: Not on file    Physically abused: Not on file    Forced sexual activity: Not on file  Other Topics Concern  . Not on file  Social History Narrative  . Not on file    Physical Exam      --pt had called me prior to our appointment and stated he got a call from his sister and he has to go over there and should be back by 430--I told him to call me when he returns and I will come out.---  He never did call me back before my shift ended---will try again tomor.   Marylouise Stacks, [facility] Paramedic  09/16/18

## 2018-09-18 ENCOUNTER — Telehealth (HOSPITAL_COMMUNITY): Payer: Self-pay | Admitting: Licensed Clinical Social Worker

## 2018-09-18 NOTE — Telephone Encounter (Signed)
CSW informed by [facility] paramedic that pt has changed addresses but was unable to give her the address at the time they spoke.  CSW called pt to inquire about new living situation and check in.  Pt reports he still is unable to give his current address but he will call back when he finds out what it is.    CSW inquired about pt drug rehab through the [facility]- pt reports he attended for about 4 weeks and then he stopped attending.  Stated it was a good refresher course on what he needed to do but nothing new as he has been to lots of different rehab programs.  Pt reports he is still clean and feels like he is control of his substance abuse at this time.  CSW will continue to follow and assist as needed.  Jorge Ny, LCSW Clinical Social Worker [facility] Desk#: [phone] Cell#: [phone]

## 2018-09-23 ENCOUNTER — Other Ambulatory Visit (HOSPITAL_COMMUNITY): Payer: Self-pay

## 2018-09-23 NOTE — Progress Notes (Signed)
Paramedicine Encounter    Patient ID: [name], male    DOB: 22-Dec-1945, 73 y.o.   MRN: [id number]   Patient Care Team: Wenda Low, MD as PCP - General (Internal Medicine) Lorretta Harp, MD as Consulting Physician (Cardiology) Jorge Ny, LCSW as Social Worker (Licensed Clinical Social Worker)  Patient Active Problem List   Diagnosis Date Noted  . Biventricular ICD (implantable cardioverter-defibrillator) in place 07/04/2016  . PAD (peripheral artery disease) ([place]) 12/27/2015  . Erectile dysfunction 08/29/2015  . CKD (chronic kidney disease) stage 4, GFR 15-29 ml/min (HCC) 01/25/2015  . Diabetes mellitus due to underlying condition with diabetic chronic kidney disease ([place])   . Chronic kidney disease (CKD), stage IV (severe) ([place]) 11/24/2014  . DM (diabetes mellitus), type 2 with renal complications ([place]) 84/16/6063  . Chronic combined systolic and diastolic CHF (congestive heart failure) ([place]) 07/19/2014  . Hypoxia   . CHF exacerbation ([place]) 12/15/2013  . History of carotid artery stenosis 10/05/2013  . Carotid artery disease ([place]) 09/08/2013  . Hypercholesterolemia 09/08/2013  . CHF (congestive heart failure) ([place]) 06/02/2012  . Essential hypertension 06/02/2012  . At risk for sudden cardiac death 04/27/12  . Cardiomyopathy, ischemic 2012-04-27  . S/P ICD (internal cardiac defibrillator) procedure, 04/27/2012, Medtronic device ro ICM 04-27-12  . CKD (chronic kidney disease) stage 3, GFR 30-59 ml/min (HCC) 04/03/2012  . COPD (chronic obstructive pulmonary disease) ([place]) 04/03/2012  . Pulmonary edema  04/01/2012  . Chest pain at rest 04/01/2012  . Diabetes mellitus due to underlying condition ([place]) 11/01/2006  . ABUSE, COCAINE, EPISODIC 11/01/2006  . Polysubstance abuse ([place]) 11/01/2006  . MYOCARDIAL INFARCTION, HX OF 11/01/2006  . CAD (coronary artery disease) 11/01/2006    Current Outpatient Medications:  .  allopurinol (ZYLOPRIM) 300 MG tablet, TAKE 1  TABLET BY MOUTH EVERY DAY, Disp: 30 tablet, Rfl: 0 .  amLODipine (NORVASC) 5 MG tablet, TAKE 1 TABLET BY MOUTH EVERY DAY IN THE MORNING, Disp: 90 tablet, Rfl: 3 .  aspirin 81 MG EC tablet, TAKE 1 TABLET (81 MG TOTAL) BY MOUTH DAILY., Disp: 90 tablet, Rfl: 2 .  atorvastatin (LIPITOR) 40 MG tablet, TAKE 1 TABLET BY MOUTH EVERY DAY IN THE MORNING, Disp: 90 tablet, Rfl: 1 .  carvedilol (COREG) 12.5 MG tablet, TAKE 1 TABLET (12.5 MG TOTAL) BY MOUTH 2 (TWO) TIMES DAILY WITH A MEAL., Disp: 180 tablet, Rfl: 2 .  clopidogrel (PLAVIX) 75 MG tablet, Take 1 tablet (75 mg total) by mouth daily., Disp: 90 tablet, Rfl: 3 .  glimepiride (AMARYL) 1 MG tablet, Take 1 tablet by mouth daily. Reported on 03/10/2015, Disp: , Rfl:  .  hydrALAZINE (APRESOLINE) 100 MG tablet, TAKE 1 TABLET (100 MG TOTAL) BY MOUTH 3 (THREE) TIMES DAILY., Disp: 270 tablet, Rfl: 2 .  isosorbide mononitrate (IMDUR) 60 MG 24 hr tablet, TAKE 1 AND 1/2 TABLET BY MOUTH DAILY, Disp: 135 tablet, Rfl: 1 .  potassium chloride SA (K-DUR,KLOR-CON) 20 MEQ tablet, Take 1 tablet (20 mEq total) by mouth daily., Disp: 15 tablet, Rfl: 0 .  torsemide (DEMADEX) 20 MG tablet, Take 4 tablets (80 mg total) by mouth daily., Disp: 120 tablet, Rfl: 6 .  traZODone (DESYREL) 150 MG tablet, Take 150 mg by mouth at bedtime as needed for sleep. , Disp: , Rfl:  .  oxyCODONE-acetaminophen (PERCOCET) 10-325 MG tablet, Take 1 tablet by mouth. , Disp: , Rfl: 0 No Known Allergies    Social History   Socioeconomic History  . Marital status: Single  Spouse name: Not on file  . Number of children: Not on file  . Years of education: Not on file  . Highest education level: Not on file  Occupational History  . Not on file  Social Needs  . Financial resource strain: Not on file  . Food insecurity    Worry: Not on file    Inability: Not on file  . Transportation needs    Medical: Not on file    Non-medical: Not on file  Tobacco Use  . Smoking status: Former Smoker     Quit date: 04/18/1982    Years since quitting: 36.4  . Smokeless tobacco: Never Used  Substance and Sexual Activity  . Alcohol use: No  . Drug use: No    Frequency: 7.0 times per week    Types: Marijuana    Comment: stopped three years ago  . Sexual activity: Never  Lifestyle  . Physical activity    Days per week: Not on file    Minutes per session: Not on file  . Stress: Not on file  Relationships  . Social Herbalist on phone: Not on file    Gets together: Not on file    Attends religious service: Not on file    Active member of club or organization: Not on file    Attends meetings of clubs or organizations: Not on file    Relationship status: Not on file  . Intimate partner violence    Fear of current or ex partner: Not on file    Emotionally abused: Not on file    Physically abused: Not on file    Forced sexual activity: Not on file  Other Topics Concern  . Not on file  Social History Narrative  . Not on file    Physical Exam      No future appointments.  BP 126/60   Pulse 64   Temp 98.6 F (37 C)   Resp 16   SpO2 98%   Weight yesterday-?? Last visit weight-??  Pt is now at a boarding house, I have not seen him in 2 months as he has not been keeping contact with me and have not been able to make appointment for home visit.  He has not been taking his meds.  He was going to a rehab clinic but no longer goes to that. States he is clean and needs no further help.  meds verified--4 pill boxes filled up.  He has only 1 clopidogrel left-- He has isosorbide in only the 1st week pill box.   Called in clopidogrel, isosorbide for now.  He will need the following refilled by next visit-- Asa, hydralazine, atorvastatin, glimepiride, carvedilol.   He reports he is staying clean. He has about $1000 left to pay for his past due rent and then he will be able to get another apt.  He denies any complaints whatsoever. He denies sob, no dizziness, no c/p, no  edema noted. No h/a.  Eating at the shelter and his sister helps provides meals for him.   Marylouise Stacks, [facility] Paramedic  09/23/18

## 2018-09-25 ENCOUNTER — Other Ambulatory Visit (HOSPITAL_COMMUNITY): Payer: Self-pay

## 2018-09-25 NOTE — Progress Notes (Signed)
I called pt earlier today to remind him to pick up his meds from pharmacy, he said he would go as soon as we got off phone.  I called him back later in the afternoon and there was no answer. I went by where he is staying at and he was not home.   Marylouise Stacks, EMT-Paramedic  09/25/18

## 2018-09-29 ENCOUNTER — Telehealth (HOSPITAL_COMMUNITY): Payer: Self-pay

## 2018-09-29 NOTE — Telephone Encounter (Signed)
Contacted pt again to see if he picked up his meds from pharmacy that he is missing in his pill box, it rang twice and then went to VM. Pt did not call me back.   Marylouise Stacks, EMT-Paramedic  09/29/18

## 2018-09-30 ENCOUNTER — Telehealth (HOSPITAL_COMMUNITY): Payer: Self-pay

## 2018-09-30 NOTE — Telephone Encounter (Signed)
Attempted to call pt again to see if he was able to pick up meds from pharmacy so I can go back out there and place in his pill box.  He did not answer.   Marylouise Stacks, EMT-Paramedic  09/30/18

## 2018-10-01 ENCOUNTER — Other Ambulatory Visit (HOSPITAL_COMMUNITY): Payer: Self-pay

## 2018-10-01 NOTE — Progress Notes (Signed)
Telephone encounter---------  Called pt again to remind him to pick up his meds so I can come back out to place them in his pill boxes. He did not answer again, and was unable to leave a VM.   Marylouise Stacks, EMT-Paramedic  10/01/18

## 2018-10-06 ENCOUNTER — Telehealth (HOSPITAL_COMMUNITY): Payer: Self-pay

## 2018-10-06 NOTE — Telephone Encounter (Signed)
Pt has yet to pick up his meds from the pharmacy.  I called him again and no answer, unable to LVM due to it being full.   Marylouise Stacks, EMT-Paramedic  10/06/18

## 2018-10-15 ENCOUNTER — Telehealth (HOSPITAL_COMMUNITY): Payer: Self-pay

## 2018-10-15 NOTE — Telephone Encounter (Signed)
I called pt again today, no answer and unable to LVM. He has not picked up his clopidogrel and isosorbide yet and has been without it for a couple wks now.    Marylouise Stacks, EMT-Paramedic  10/15/18

## 2018-10-17 ENCOUNTER — Other Ambulatory Visit (HOSPITAL_COMMUNITY): Payer: Self-pay | Admitting: Cardiology

## 2018-10-20 ENCOUNTER — Telehealth (HOSPITAL_COMMUNITY): Payer: Self-pay

## 2018-10-20 NOTE — Telephone Encounter (Signed)
Attempted to call pt again this week--he is in need to picking up his clopidogrel and isosorbide so it can be placed in pill box. Unable to reach him, no answer again when I called.   Marylouise Stacks, EMT-Paramedic  10/20/18

## 2018-10-22 ENCOUNTER — Other Ambulatory Visit (HOSPITAL_COMMUNITY): Payer: Self-pay

## 2018-10-23 ENCOUNTER — Telehealth (HOSPITAL_COMMUNITY): Payer: Self-pay | Admitting: Licensed Clinical Social Worker

## 2018-10-23 NOTE — Telephone Encounter (Signed)
CSW informed by [employer] that pt is out of several of his medications and cannot afford to buy them at this time.  CSW called pt pharmacy and confirmed that his medications would cost $15- CSW able to assist with these cost and community paramedic to pick up medications and take to patient.  CSW will continue to follow and assist as needed  Jorge Ny, [facility] Desk#: [phone] Cell#: [phone]

## 2018-10-23 NOTE — Progress Notes (Signed)
I went by pts place of living since he has not been returning my multiple missed calls and has not been picking up his meds.  He was there,  when he came to door he said he just woke up and is maintaining free of drugs--I told him he needed to pick up his meds at the pharmacy, he said he is out of money already and he got paid a week ago. He said he had no money to get his meds.  I am concerned that since he is out of money already that he may be back using again.  I called jenna to relay this to her and she was able to assist with the cost of his co-pay. I will pick up the money and then pick up his meds and take to him.   Marylouise Stacks, EMT-Paramedic 10/23/18

## 2018-10-28 ENCOUNTER — Other Ambulatory Visit (HOSPITAL_COMMUNITY): Payer: Self-pay

## 2018-10-28 NOTE — Progress Notes (Signed)
Paramedicine Encounter    Patient ID: [name], male    DOB: 19-May-1945, 73 y.o.   MRN: [id number]   Patient Care Team: John Low, MD as PCP - General (Internal Medicine) John Harp, MD as Consulting Physician (Cardiology) John Ny, LCSW as Social Worker (Licensed Clinical Social Worker)  Patient Active Problem List   Diagnosis Date Noted  . Biventricular ICD (implantable cardioverter-defibrillator) in place 07/04/2016  . PAD (peripheral artery disease) ([place]) 12/27/2015  . Erectile dysfunction 08/29/2015  . CKD (chronic kidney disease) stage 4, GFR 15-29 ml/min (HCC) 01/25/2015  . Diabetes mellitus due to underlying condition with diabetic chronic kidney disease ([place])   . Chronic kidney disease (CKD), stage IV (severe) ([place]) 11/24/2014  . DM (diabetes mellitus), type 2 with renal complications ([place]) 22/29/7989  . Chronic combined systolic and diastolic CHF (congestive heart failure) ([place]) 07/19/2014  . Hypoxia   . CHF exacerbation ([place]) 12/15/2013  . History of carotid artery stenosis 10/05/2013  . Carotid artery disease ([place]) 09/08/2013  . Hypercholesterolemia 09/08/2013  . CHF (congestive heart failure) ([place]) 06/02/2012  . Essential hypertension 06/02/2012  . At risk for sudden cardiac death 05/18/12  . Cardiomyopathy, ischemic May 18, 2012  . S/P ICD (internal cardiac defibrillator) procedure, 05/18/12, Medtronic device ro ICM 05/18/12  . CKD (chronic kidney disease) stage 3, GFR 30-59 ml/min (HCC) 04/03/2012  . COPD (chronic obstructive pulmonary disease) ([place]) 04/03/2012  . Pulmonary edema  04/01/2012  . Chest pain at rest 04/01/2012  . Diabetes mellitus due to underlying condition ([place]) 11/01/2006  . ABUSE, COCAINE, EPISODIC 11/01/2006  . Polysubstance abuse ([place]) 11/01/2006  . MYOCARDIAL INFARCTION, HX OF 11/01/2006  . CAD (coronary artery disease) 11/01/2006    Current Outpatient Medications:  .  allopurinol (ZYLOPRIM) 300 MG tablet, TAKE 1  TABLET BY MOUTH EVERY DAY, Disp: 30 tablet, Rfl: 0 .  amLODipine (NORVASC) 5 MG tablet, TAKE 1 TABLET BY MOUTH EVERY DAY IN THE MORNING, Disp: 90 tablet, Rfl: 3 .  aspirin 81 MG EC tablet, TAKE 1 TABLET (81 MG TOTAL) BY MOUTH DAILY., Disp: 90 tablet, Rfl: 2 .  atorvastatin (LIPITOR) 40 MG tablet, TAKE 1 TABLET BY MOUTH EVERY DAY IN THE MORNING, Disp: 90 tablet, Rfl: 1 .  carvedilol (COREG) 12.5 MG tablet, TAKE 1 TABLET (12.5 MG TOTAL) BY MOUTH 2 (TWO) TIMES DAILY WITH A MEAL., Disp: 180 tablet, Rfl: 2 .  clopidogrel (PLAVIX) 75 MG tablet, Take 1 tablet (75 mg total) by mouth daily., Disp: 90 tablet, Rfl: 3 .  glimepiride (AMARYL) 1 MG tablet, Take 1 tablet by mouth daily. Reported on 03/10/2015, Disp: , Rfl:  .  hydrALAZINE (APRESOLINE) 100 MG tablet, TAKE 1 TABLET (100 MG TOTAL) BY MOUTH 3 (THREE) TIMES DAILY., Disp: 270 tablet, Rfl: 2 .  isosorbide mononitrate (IMDUR) 60 MG 24 hr tablet, TAKE 1 AND 1/2 TABLET BY MOUTH DAILY, Disp: 135 tablet, Rfl: 1 .  potassium chloride SA (K-DUR,KLOR-CON) 20 MEQ tablet, Take 1 tablet (20 mEq total) by mouth daily., Disp: 15 tablet, Rfl: 0 .  torsemide (DEMADEX) 20 MG tablet, TAKE 4 TABLETS (80 MG TOTAL) BY MOUTH DAILY., Disp: 360 tablet, Rfl: 1 .  traZODone (DESYREL) 150 MG tablet, Take 150 mg by mouth at bedtime as needed for sleep. , Disp: , Rfl:  .  oxyCODONE-acetaminophen (PERCOCET) 10-325 MG tablet, Take 1 tablet by mouth. , Disp: , Rfl: 0 No Known Allergies    Social History   Socioeconomic History  . Marital status: Single  Spouse name: Not on file  . Number of children: Not on file  . Years of education: Not on file  . Highest education level: Not on file  Occupational History  . Not on file  Social Needs  . Financial resource strain: Not on file  . Food insecurity    Worry: Not on file    Inability: Not on file  . Transportation needs    Medical: Not on file    Non-medical: Not on file  Tobacco Use  . Smoking status: Former Smoker     Quit date: 04/18/1982    Years since quitting: 36.5  . Smokeless tobacco: Never Used  Substance and Sexual Activity  . Alcohol use: No  . Drug use: No    Frequency: 7.0 times per week    Types: Marijuana    Comment: stopped three years ago  . Sexual activity: Never  Lifestyle  . Physical activity    Days per week: Not on file    Minutes per session: Not on file  . Stress: Not on file  Relationships  . Social Herbalist on phone: Not on file    Gets together: Not on file    Attends religious service: Not on file    Active member of club or organization: Not on file    Attends meetings of clubs or organizations: Not on file    Relationship status: Not on file  . Intimate partner violence    Fear of current or ex partner: Not on file    Emotionally abused: Not on file    Physically abused: Not on file    Forced sexual activity: Not on file  Other Topics Concern  . Not on file  Social History Narrative  . Not on file    Physical Exam      No future appointments.  BP 110/60   Pulse 80   Temp 98.2 F (36.8 C)   Resp 16   Wt 144 lb (65.3 kg)   SpO2 97%   BMI 22.55 kg/m   Weight yesterday-? Last visit weight-? Lost scales   Pt home today so I was able to pick up pts meds and bring them to him. I have not seen pt to do pill boxes since 7/22--4 of them was filled up but he was missing the clopidogrel and the isosorbide. He still has one pill box left, he should be empty by now, so he is still not taking them daily.  Clinic was able to fund the $15 for the isosorbide. I picked that up along with clopidogrel, torsemide, amlodipine and allopurinol.  He needs hydralazine, asa, atorvastatin, glimepiride filled as well. Also trazadone.  Will leave only 1 pill box filled at this time.  Pt unable to weigh since his eviction-lost scales. I did give him another set of scales.  He denies sob, no dizziness, no c/p, no h/a.  He states he is out of trazadone but a 90  day supply was given in July. He states he has been taking 2 a day when he gets sleepy.   the hydralazine is $15.  Asa and glimepiride unknown amount--the doc has to send in new rx for those.  Atorvastatin is $0.  He was asking for more trazadone too-I asked pharmacy to refill and they advised a 90 day supply was given in July and is too soon to fill. I asked pt if he is using more of that than directed and he  said he was taking up to 2 a day b/c during the day he got sleepy too. I told him it was only for night time use to help him sleep at night not during the day and if he was sleepy during the day then he can just nap and not take meds to sleep.  Now he wont be able to get it refilled until end of sept/beg of oct.  He said he could get the money to get his meds by next Monday. I will call again on Monday to follow up on this.   Marylouise Stacks, [facility] Paramedic  10/28/18

## 2018-10-30 ENCOUNTER — Other Ambulatory Visit (HOSPITAL_COMMUNITY): Payer: Self-pay

## 2018-10-30 MED ORDER — ASPIRIN 81 MG PO TBEC: DELAYED_RELEASE_TABLET | ORAL | 2 refills | Status: AC

## 2018-11-03 ENCOUNTER — Telehealth (HOSPITAL_COMMUNITY): Payer: Self-pay

## 2018-11-03 NOTE — Telephone Encounter (Signed)
I contacted pt to see if he was able to pick up his 4 rx from pharmacy but he did not answer and was not able to leave VM on his phone.   Marylouise Stacks, EMT-Paramedic  11/03/18

## 2018-11-04 ENCOUNTER — Telehealth (HOSPITAL_COMMUNITY): Payer: Self-pay

## 2018-11-04 NOTE — Telephone Encounter (Signed)
I called pt again to see if he picked up meds from pharmacy yet, he did not answer.   Marylouise Stacks, EMT-Paramedic  11/04/18

## 2018-11-11 ENCOUNTER — Telehealth (HOSPITAL_COMMUNITY): Payer: Self-pay

## 2018-11-11 NOTE — Telephone Encounter (Signed)
I called pt to see if he picked up his meds so I can come out for a visit.  He did not answer and his VM was full so unable to leave a message.   Marylouise Stacks, EMT-Paramedic  11/11/18

## 2018-11-14 ENCOUNTER — Telehealth: Payer: Self-pay

## 2018-11-14 NOTE — Telephone Encounter (Signed)
Called pts daughter and informed her that we are trying to schedule her father an office visit and to have him call back when he can to set up appointment.

## 2018-11-26 ENCOUNTER — Other Ambulatory Visit (HOSPITAL_COMMUNITY): Payer: Self-pay

## 2018-11-26 NOTE — Progress Notes (Signed)
=====  telephone encounter=====  I called pt,but no one answered, no car was there, all the doors was closed up nobody home.  He finally called me back and said his phone had been messed up and his car was in the shop.  He will be home tomor and I will come out then.   Marylouise Stacks, EMT-Paramedic  11/26/18

## 2018-11-27 ENCOUNTER — Other Ambulatory Visit (HOSPITAL_COMMUNITY): Payer: Self-pay

## 2018-11-27 NOTE — Progress Notes (Signed)
I came out today based on our conversation yesterday stating he would be home all day. I did call him but no answer.  When I arrived his roommate answered the door, he said his car was gone so he was gone. I told him he told me, his car was in the shop. This roommate said he took him to garage to get it back so he knows its working again and he is gone.  He said that sounded like him by knowing I was coming by and leaving and also not answering the phone.   Marylouise Stacks, EMT-Paramedic  11/27/18

## 2018-12-02 ENCOUNTER — Telehealth (HOSPITAL_COMMUNITY): Payer: Self-pay

## 2018-12-03 NOTE — Telephone Encounter (Signed)
Pt had returned my call over the weekend and LVM. I returned the call and there was no answer and unable to LVM on his phone.   Marylouise Stacks, EMT-Paramedic  12/03/18

## 2018-12-04 ENCOUNTER — Telehealth (HOSPITAL_COMMUNITY): Payer: Self-pay

## 2018-12-04 NOTE — Telephone Encounter (Signed)
Pt finally called me back, he reports last week on the day I came to see him and he was not there-his car had broke down on him again in [place] and he stayed overnight with a friend there and that friend took him back home the next day.  Due to my packed schedule today I will have to follow up with him on Monday and he is agreeable to that.   Marylouise Stacks, EMT-Paramedic  12/04/18

## 2018-12-08 ENCOUNTER — Other Ambulatory Visit: Payer: Self-pay | Admitting: Cardiology

## 2018-12-08 ENCOUNTER — Other Ambulatory Visit (HOSPITAL_COMMUNITY): Payer: Self-pay

## 2018-12-08 NOTE — Progress Notes (Signed)
Paramedicine Encounter    Patient ID: [name], male    DOB: 1945-05-24, 73 y.o.   MRN: [id number]   Patient Care Team: Wenda Low, MD as PCP - General (Internal Medicine) Lorretta Harp, MD as Consulting Physician (Cardiology) Jorge Ny, LCSW as Social Worker (Licensed Clinical Social Worker)  Patient Active Problem List   Diagnosis Date Noted  . Biventricular ICD (implantable cardioverter-defibrillator) in place 07/04/2016  . PAD (peripheral artery disease) ([place]) 12/27/2015  . Erectile dysfunction 08/29/2015  . CKD (chronic kidney disease) stage 4, GFR 15-29 ml/min (HCC) 01/25/2015  . Diabetes mellitus due to underlying condition with diabetic chronic kidney disease ([place])   . Chronic kidney disease (CKD), stage IV (severe) ([place]) 11/24/2014  . DM (diabetes mellitus), type 2 with renal complications ([place]) 81/03/7508  . Chronic combined systolic and diastolic CHF (congestive heart failure) ([place]) 07/19/2014  . Hypoxia   . CHF exacerbation ([place]) 12/15/2013  . History of carotid artery stenosis 10/05/2013  . Carotid artery disease ([place]) 09/08/2013  . Hypercholesterolemia 09/08/2013  . CHF (congestive heart failure) ([place]) 06/02/2012  . Essential hypertension 06/02/2012  . At risk for sudden cardiac death 05-16-12  . Cardiomyopathy, ischemic May 16, 2012  . S/P ICD (internal cardiac defibrillator) procedure, 2012-05-16, Medtronic device ro ICM May 16, 2012  . CKD (chronic kidney disease) stage 3, GFR 30-59 ml/min (HCC) 04/03/2012  . COPD (chronic obstructive pulmonary disease) ([place]) 04/03/2012  . Pulmonary edema  04/01/2012  . Chest pain at rest 04/01/2012  . Diabetes mellitus due to underlying condition ([place]) 11/01/2006  . ABUSE, COCAINE, EPISODIC 11/01/2006  . Polysubstance abuse ([place]) 11/01/2006  . MYOCARDIAL INFARCTION, HX OF 11/01/2006  . CAD (coronary artery disease) 11/01/2006    Current Outpatient Medications:  .  allopurinol (ZYLOPRIM) 300 MG tablet, TAKE 1  TABLET BY MOUTH EVERY DAY, Disp: 30 tablet, Rfl: 0 .  amLODipine (NORVASC) 5 MG tablet, TAKE 1 TABLET BY MOUTH EVERY DAY IN THE MORNING, Disp: 90 tablet, Rfl: 3 .  aspirin 81 MG EC tablet, TAKE 1 TABLET (81 MG TOTAL) BY MOUTH DAILY., Disp: 90 tablet, Rfl: 2 .  atorvastatin (LIPITOR) 40 MG tablet, TAKE 1 TABLET BY MOUTH EVERY DAY IN THE MORNING, Disp: 90 tablet, Rfl: 1 .  carvedilol (COREG) 12.5 MG tablet, TAKE 1 TABLET (12.5 MG TOTAL) BY MOUTH 2 (TWO) TIMES DAILY WITH A MEAL., Disp: 180 tablet, Rfl: 2 .  clopidogrel (PLAVIX) 75 MG tablet, Take 1 tablet (75 mg total) by mouth daily., Disp: 90 tablet, Rfl: 3 .  glimepiride (AMARYL) 1 MG tablet, Take 1 tablet by mouth daily. Reported on 03/10/2015, Disp: , Rfl:  .  hydrALAZINE (APRESOLINE) 100 MG tablet, TAKE 1 TABLET (100 MG TOTAL) BY MOUTH 3 (THREE) TIMES DAILY., Disp: 270 tablet, Rfl: 2 .  isosorbide mononitrate (IMDUR) 60 MG 24 hr tablet, TAKE 1 AND 1/2 TABLET BY MOUTH DAILY, Disp: 135 tablet, Rfl: 1 .  oxyCODONE-acetaminophen (PERCOCET) 10-325 MG tablet, Take 1 tablet by mouth. , Disp: , Rfl: 0 .  potassium chloride SA (K-DUR,KLOR-CON) 20 MEQ tablet, Take 1 tablet (20 mEq total) by mouth daily., Disp: 15 tablet, Rfl: 0 .  torsemide (DEMADEX) 20 MG tablet, TAKE 4 TABLETS (80 MG TOTAL) BY MOUTH DAILY., Disp: 360 tablet, Rfl: 1 .  traZODone (DESYREL) 150 MG tablet, Take 150 mg by mouth at bedtime as needed for sleep. , Disp: , Rfl:  No Known Allergies    Social History   Socioeconomic History  . Marital status: Single  Spouse name: Not on file  . Number of children: Not on file  . Years of education: Not on file  . Highest education level: Not on file  Occupational History  . Not on file  Social Needs  . Financial resource strain: Not on file  . Food insecurity    Worry: Not on file    Inability: Not on file  . Transportation needs    Medical: Not on file    Non-medical: Not on file  Tobacco Use  . Smoking status: Former Smoker     Quit date: 04/18/1982    Years since quitting: 36.6  . Smokeless tobacco: Never Used  Substance and Sexual Activity  . Alcohol use: No  . Drug use: No    Frequency: 7.0 times per week    Types: Marijuana    Comment: stopped three years ago  . Sexual activity: Never  Lifestyle  . Physical activity    Days per week: Not on file    Minutes per session: Not on file  . Stress: Not on file  Relationships  . Social Herbalist on phone: Not on file    Gets together: Not on file    Attends religious service: Not on file    Active member of club or organization: Not on file    Attends meetings of clubs or organizations: Not on file    Relationship status: Not on file  . Intimate partner violence    Fear of current or ex partner: Not on file    Emotionally abused: Not on file    Physically abused: Not on file    Forced sexual activity: Not on file  Other Topics Concern  . Not on file  Social History Narrative  . Not on file    Physical Exam      No future appointments.  BP (!) 170/70   Pulse 60   Temp (!) 97 F (36.1 C)   Wt 145 lb (65.8 kg)   SpO2 98%   BMI 22.71 kg/m   Weight yesterday-146 Last visit weight-144 [facility]-106  Finally able to come do home visit, pts car broke down but is now in the shop.  He denies sob, no c/p, no dizziness, no edema noted. He has been without his meds for several weeks now.  He reports his sister fixes and brings him his meals to the rooming house.  He has been eating a lot of pork, sausages and other meats. I advised him that he needs to limit his sodium intake.  His meds were filled up for 3 days to get him through to Thursday when he gets paid to pick up his meds.  I will call him to remind him to pick them and the costs and to pick them up and I will come back then to finish pill boxes.  B/p elevated as noted--no meds lately.  Last seen dr Lysle Rubens 52months ago.  Glimepiride--needs PCP to refill dr Lysle Rubens,  hydralazine-refill, atorvastatin, asa, carvedilol--needs refill, trazadone-$8.40  Marylouise Stacks, [place] Paramedic  12/08/18

## 2018-12-11 ENCOUNTER — Telehealth (HOSPITAL_COMMUNITY): Payer: Self-pay

## 2018-12-18 ENCOUNTER — Telehealth (HOSPITAL_COMMUNITY): Payer: Self-pay

## 2018-12-18 NOTE — Telephone Encounter (Signed)
Pt states it will be later on when he goes out since he got paid to pick up his meds.   Marylouise Stacks, EMT-Paramedic  12/18/18

## 2018-12-18 NOTE — Telephone Encounter (Signed)
Pt called me stating he never picked up his meds and his sister was going to be coming to give him money.   I called pharmacy to ensure the meds were still on the shelf to be picked up and they advised the hydralazine, carvedilol was not filled and the glimerpiride had no refills and he will send it back to PCP to authorize more refills on that one.  The total cost for all 5 meds is $23.40.  Atorvastatin-$0 Asa-$8.40 trazadone-$0 Carvedilol-$0 Hydralazine-$15  Attempted to call pt back but he did not answer.   Marylouise Stacks, EMT-Paramedic  12/18/18

## 2018-12-18 NOTE — Telephone Encounter (Signed)
Duplicate entry

## 2018-12-23 ENCOUNTER — Telehealth (HOSPITAL_COMMUNITY): Payer: Self-pay

## 2018-12-23 NOTE — Telephone Encounter (Signed)
Pt reports he picked up his meds however he would not be back home by 430 at our appoint time.  Rescheduled it for in the morning, he said he plans to be there.   Marylouise Stacks, EMT-Paramedic  12/23/18

## 2018-12-24 ENCOUNTER — Other Ambulatory Visit (HOSPITAL_COMMUNITY): Payer: Self-pay

## 2018-12-24 NOTE — Progress Notes (Signed)
Paramedicine Encounter    Patient ID: [name], male    DOB: 02-Apr-1945, 73 y.o.   MRN: [id number]   Patient Care Team: Wenda Low, MD as PCP - General (Internal Medicine) Lorretta Harp, MD as Consulting Physician (Cardiology) Jorge Ny, LCSW as Social Worker (Licensed Clinical Social Worker)  Patient Active Problem List   Diagnosis Date Noted  . Biventricular ICD (implantable cardioverter-defibrillator) in place 07/04/2016  . PAD (peripheral artery disease) ([place]) 12/27/2015  . Erectile dysfunction 08/29/2015  . CKD (chronic kidney disease) stage 4, GFR 15-29 ml/min (HCC) 01/25/2015  . Diabetes mellitus due to underlying condition with diabetic chronic kidney disease ([place])   . Chronic kidney disease (CKD), stage IV (severe) ([place]) 11/24/2014  . DM (diabetes mellitus), type 2 with renal complications ([place]) 09/05/9483  . Chronic combined systolic and diastolic CHF (congestive heart failure) ([place]) 07/19/2014  . Hypoxia   . CHF exacerbation ([place]) 12/15/2013  . History of carotid artery stenosis 10/05/2013  . Carotid artery disease ([place]) 09/08/2013  . Hypercholesterolemia 09/08/2013  . CHF (congestive heart failure) ([place]) 06/02/2012  . Essential hypertension 06/02/2012  . At risk for sudden cardiac death 2012/05/07  . Cardiomyopathy, ischemic 05/07/2012  . S/P ICD (internal cardiac defibrillator) procedure, 2012-05-07, Medtronic device ro ICM 07-May-2012  . CKD (chronic kidney disease) stage 3, GFR 30-59 ml/min 04/03/2012  . COPD (chronic obstructive pulmonary disease) ([place]) 04/03/2012  . Pulmonary edema  04/01/2012  . Chest pain at rest 04/01/2012  . Diabetes mellitus due to underlying condition ([place]) 11/01/2006  . ABUSE, COCAINE, EPISODIC 11/01/2006  . Polysubstance abuse ([place]) 11/01/2006  . MYOCARDIAL INFARCTION, HX OF 11/01/2006  . CAD (coronary artery disease) 11/01/2006    Current Outpatient Medications:  .  allopurinol (ZYLOPRIM) 300 MG tablet, TAKE 1 TABLET BY  MOUTH EVERY DAY, Disp: 30 tablet, Rfl: 0 .  amLODipine (NORVASC) 5 MG tablet, TAKE 1 TABLET BY MOUTH EVERY DAY IN THE MORNING, Disp: 90 tablet, Rfl: 3 .  aspirin 81 MG EC tablet, TAKE 1 TABLET (81 MG TOTAL) BY MOUTH DAILY., Disp: 90 tablet, Rfl: 2 .  atorvastatin (LIPITOR) 40 MG tablet, TAKE 1 TABLET BY MOUTH EVERY DAY IN THE MORNING, Disp: 90 tablet, Rfl: 1 .  carvedilol (COREG) 12.5 MG tablet, TAKE 1 TABLET (12.5 MG TOTAL) BY MOUTH 2 (TWO) TIMES DAILY WITH A MEAL., Disp: 180 tablet, Rfl: 2 .  clopidogrel (PLAVIX) 75 MG tablet, Take 1 tablet (75 mg total) by mouth daily., Disp: 90 tablet, Rfl: 3 .  glimepiride (AMARYL) 1 MG tablet, Take 1 tablet by mouth daily. Reported on 03/10/2015, Disp: , Rfl:  .  hydrALAZINE (APRESOLINE) 100 MG tablet, TAKE 1 TABLET (100 MG TOTAL) BY MOUTH 3 (THREE) TIMES DAILY., Disp: 270 tablet, Rfl: 2 .  isosorbide mononitrate (IMDUR) 60 MG 24 hr tablet, TAKE 1 AND 1/2 TABLET BY MOUTH DAILY, Disp: 135 tablet, Rfl: 1 .  oxyCODONE-acetaminophen (PERCOCET) 10-325 MG tablet, Take 1 tablet by mouth. , Disp: , Rfl: 0 .  potassium chloride SA (K-DUR,KLOR-CON) 20 MEQ tablet, Take 1 tablet (20 mEq total) by mouth daily., Disp: 15 tablet, Rfl: 0 .  torsemide (DEMADEX) 20 MG tablet, TAKE 4 TABLETS (80 MG TOTAL) BY MOUTH DAILY., Disp: 360 tablet, Rfl: 1 .  traZODone (DESYREL) 150 MG tablet, Take 150 mg by mouth at bedtime as needed for sleep. , Disp: , Rfl:  No Known Allergies    Social History   Socioeconomic History  . Marital status: Single  Spouse name: Not on file  . Number of children: Not on file  . Years of education: Not on file  . Highest education level: Not on file  Occupational History  . Not on file  Social Needs  . Financial resource strain: Not on file  . Food insecurity    Worry: Not on file    Inability: Not on file  . Transportation needs    Medical: Not on file    Non-medical: Not on file  Tobacco Use  . Smoking status: Former Smoker    Quit  date: 04/18/1982    Years since quitting: 36.7  . Smokeless tobacco: Never Used  Substance and Sexual Activity  . Alcohol use: No  . Drug use: No    Frequency: 7.0 times per week    Types: Marijuana    Comment: stopped three years ago  . Sexual activity: Never  Lifestyle  . Physical activity    Days per week: Not on file    Minutes per session: Not on file  . Stress: Not on file  Relationships  . Social Herbalist on phone: Not on file    Gets together: Not on file    Attends religious service: Not on file    Active member of club or organization: Not on file    Attends meetings of clubs or organizations: Not on file    Relationship status: Not on file  . Intimate partner violence    Fear of current or ex partner: Not on file    Emotionally abused: Not on file    Physically abused: Not on file    Forced sexual activity: Not on file  Other Topics Concern  . Not on file  Social History Narrative  . Not on file    Physical Exam      No future appointments.  BP (!) 186/80   Pulse 64   Temp 98 F (36.7 C)   Resp 16   Wt 147 lb (66.7 kg)   SpO2 99%   BMI 23.02 kg/m   Weight yesterday-145 Last visit weight-145  Pt was able to get his meds picked up yesterday but was not going to be home until late yesterday so he agreed for a visit today. He has been without meds for a couple weeks again due to non-communication with me and not home.  He ran out of his glimepiride- a few days short in one pill box. He needs to be seen at PCP so refills can be done. They will not refill unless he is seen.  Phone number was given to him to call for appointment.  4 pill boxes was filled today.  Pt denies sob, no dizziness. No c/p. No complaints noted. No edema noted.  B/p elevated but he has been off his meds for a few wks now.   Marylouise Stacks, [facility] Paramedic  12/24/18

## 2019-01-12 DIAGNOSIS — M542 Cervicalgia: Secondary | ICD-10-CM | POA: Diagnosis not present

## 2019-01-12 DIAGNOSIS — M47816 Spondylosis without myelopathy or radiculopathy, lumbar region: Secondary | ICD-10-CM | POA: Diagnosis not present

## 2019-01-12 DIAGNOSIS — M170 Bilateral primary osteoarthritis of knee: Secondary | ICD-10-CM | POA: Diagnosis not present

## 2019-01-26 ENCOUNTER — Telehealth (HOSPITAL_COMMUNITY): Payer: Self-pay

## 2019-01-26 NOTE — Telephone Encounter (Signed)
Contacted pt regarding home visit this week as his pill boxes should be empty. No answer and unable to LVM due to mailbox being full.   Marylouise Stacks, EMT-Paramedic  01/26/19

## 2019-02-09 ENCOUNTER — Telehealth (HOSPITAL_COMMUNITY): Payer: Self-pay

## 2019-02-09 NOTE — Telephone Encounter (Signed)
LVM for pt to return my call for home visit.   Marylouise Stacks, EMT-Paramedic  02/09/19

## 2019-02-12 ENCOUNTER — Telehealth (HOSPITAL_COMMUNITY): Payer: Self-pay

## 2019-02-12 NOTE — Telephone Encounter (Signed)
attempted to reach pt again, no answer and unable to LVM as his mailbox is full.   Marylouise Stacks, EMT-Paramedic  02/12/19

## 2019-02-23 ENCOUNTER — Other Ambulatory Visit (HOSPITAL_COMMUNITY): Payer: Self-pay

## 2019-02-23 NOTE — Progress Notes (Signed)
Paramedicine Encounter    Patient ID: [name], male    DOB: [birth year], 73 y.o.   MRN: [id number]   Patient Care Team: Wenda Low, MD as PCP - General (Internal Medicine) Lorretta Harp, MD as Consulting Physician (Cardiology) Jorge Ny, LCSW as Social Worker (Licensed Clinical Social Worker)  Patient Active Problem List   Diagnosis Date Noted  . Biventricular ICD (implantable cardioverter-defibrillator) in place 07/04/2016  . PAD (peripheral artery disease) ([place]) 12/27/2015  . Erectile dysfunction 08/29/2015  . CKD (chronic kidney disease) stage 4, GFR 15-29 ml/min (HCC) 01/25/2015  . Diabetes mellitus due to underlying condition with diabetic chronic kidney disease ([place])   . Chronic kidney disease (CKD), stage IV (severe) ([place]) 11/24/2014  . DM (diabetes mellitus), type 2 with renal complications ([place]) 44/31/5400  . Chronic combined systolic and diastolic CHF (congestive heart failure) ([place]) 07/19/2014  . Hypoxia   . CHF exacerbation ([place]) 12/15/2013  . History of carotid artery stenosis 10/05/2013  . Carotid artery disease ([place]) 09/08/2013  . Hypercholesterolemia 09/08/2013  . CHF (congestive heart failure) ([place]) 06/02/2012  . Essential hypertension 06/02/2012  . At risk for sudden cardiac death May 15, 2012  . Cardiomyopathy, ischemic May 15, 2012  . S/P ICD (internal cardiac defibrillator) procedure, 2012-05-15, Medtronic device ro ICM 15-May-2012  . CKD (chronic kidney disease) stage 3, GFR 30-59 ml/min 04/03/2012  . COPD (chronic obstructive pulmonary disease) ([place]) 04/03/2012  . Pulmonary edema  04/01/2012  . Chest pain at rest 04/01/2012  . Diabetes mellitus due to underlying condition ([place]) 11/01/2006  . ABUSE, COCAINE, EPISODIC 11/01/2006  . Polysubstance abuse ([place]) 11/01/2006  . MYOCARDIAL INFARCTION, HX OF 11/01/2006  . CAD (coronary artery disease) 11/01/2006    Current Outpatient Medications:  .  allopurinol (ZYLOPRIM) 300 MG tablet, TAKE 1 TABLET BY  MOUTH EVERY DAY, Disp: 30 tablet, Rfl: 0 .  amLODipine (NORVASC) 5 MG tablet, TAKE 1 TABLET BY MOUTH EVERY DAY IN THE MORNING, Disp: 90 tablet, Rfl: 3 .  aspirin 81 MG EC tablet, TAKE 1 TABLET (81 MG TOTAL) BY MOUTH DAILY., Disp: 90 tablet, Rfl: 2 .  atorvastatin (LIPITOR) 40 MG tablet, TAKE 1 TABLET BY MOUTH EVERY DAY IN THE MORNING, Disp: 90 tablet, Rfl: 1 .  carvedilol (COREG) 12.5 MG tablet, TAKE 1 TABLET (12.5 MG TOTAL) BY MOUTH 2 (TWO) TIMES DAILY WITH A MEAL., Disp: 180 tablet, Rfl: 2 .  clopidogrel (PLAVIX) 75 MG tablet, Take 1 tablet (75 mg total) by mouth daily., Disp: 90 tablet, Rfl: 3 .  hydrALAZINE (APRESOLINE) 100 MG tablet, TAKE 1 TABLET (100 MG TOTAL) BY MOUTH 3 (THREE) TIMES DAILY., Disp: 270 tablet, Rfl: 2 .  isosorbide mononitrate (IMDUR) 60 MG 24 hr tablet, TAKE 1 AND 1/2 TABLET BY MOUTH DAILY, Disp: 135 tablet, Rfl: 1 .  potassium chloride SA (K-DUR,KLOR-CON) 20 MEQ tablet, Take 1 tablet (20 mEq total) by mouth daily., Disp: 15 tablet, Rfl: 0 .  torsemide (DEMADEX) 20 MG tablet, TAKE 4 TABLETS (80 MG TOTAL) BY MOUTH DAILY., Disp: 360 tablet, Rfl: 1 .  glimepiride (AMARYL) 1 MG tablet, Take 1 tablet by mouth daily. Reported on 03/10/2015, Disp: , Rfl:  .  oxyCODONE-acetaminophen (PERCOCET) 10-325 MG tablet, Take 1 tablet by mouth. , Disp: , Rfl: 0 .  traZODone (DESYREL) 150 MG tablet, Take 150 mg by mouth at bedtime as needed for sleep. , Disp: , Rfl:  No Known Allergies    Social History   Socioeconomic History  . Marital status: Single  Spouse name: Not on file  . Number of children: Not on file  . Years of education: Not on file  . Highest education level: Not on file  Occupational History  . Not on file  Tobacco Use  . Smoking status: Former Smoker    Quit date: 04/18/1982    Years since quitting: 36.8  . Smokeless tobacco: Never Used  Substance and Sexual Activity  . Alcohol use: No  . Drug use: No    Frequency: 7.0 times per week    Types: Marijuana     Comment: stopped three years ago  . Sexual activity: Never  Other Topics Concern  . Not on file  Social History Narrative  . Not on file   Social Determinants of Health   Financial Resource Strain:   . Difficulty of Paying Living Expenses: Not on file  Food Insecurity:   . Worried About Charity fundraiser in the Last Year: Not on file  . Ran Out of Food in the Last Year: Not on file  Transportation Needs:   . Lack of Transportation (Medical): Not on file  . Lack of Transportation (Non-Medical): Not on file  Physical Activity:   . Days of Exercise per Week: Not on file  . Minutes of Exercise per Session: Not on file  Stress:   . Feeling of Stress : Not on file  Social Connections:   . Frequency of Communication with Friends and Family: Not on file  . Frequency of Social Gatherings with Friends and Family: Not on file  . Attends Religious Services: Not on file  . Active Member of Clubs or Organizations: Not on file  . Attends Archivist Meetings: Not on file  . Marital Status: Not on file  Intimate Partner Violence:   . Fear of Current or Ex-Partner: Not on file  . Emotionally Abused: Not on file  . Physically Abused: Not on file  . Sexually Abused: Not on file    Physical Exam      No future appointments.  BP (!) 162/88   Pulse 80   Resp 16   Wt 141 lb (64 kg)   SpO2 99%   BMI 22.08 kg/m  CBG EMS-158 Weight yesterday-?? Last visit weight-147   Finally pt called me back. He said he lost my number and his phone deleted the contact somehow.  Again pt has missed a few wks of his meds due to this lack of contact with me. He has not been seen at his PCP for his glimerpiride refill.  He p/u his meds  (amlodipine, allopurinol, isosorbide and torsemide) but has not been taking them from the bottle.  His sister is still bringing his foods He is still at the the boarding house.  He states he needs trazadone as well.  meds verified and 4 pill boxes  refilled.  I checked his phone with his permission and he had my number saved another name so I fixed that.  He also had his phone on do not disturb so that was fixed too.  Pt denies any sob, no dizziness, no c/p, no edema noted.  Has not been weighing daily.  His weight is down 6lbs from 78months ago. He states his appetite is good but he has been cutting back on his portions. He looks very skinny. No edema noted. Advised him to not cut back too much as his weight is going down. He was in the 160s earlier this year.  B/p elevated  however he has been without his meds for several wks. He is restarting them today.  Called pharmacy to order trazadone-they are able to fill it and he is able to get it today.  His car is working now.   Marylouise Stacks, [facility] Paramedic  02/23/19

## 2019-03-13 ENCOUNTER — Emergency Department (HOSPITAL_COMMUNITY): Payer: Medicare HMO

## 2019-03-13 ENCOUNTER — Inpatient Hospital Stay (HOSPITAL_COMMUNITY)
Admission: EM | Admit: 2019-03-13 | Discharge: 2019-04-13 | DRG: 388 | Disposition: E | Payer: Medicare HMO | Attending: Internal Medicine | Admitting: Internal Medicine

## 2019-03-13 ENCOUNTER — Inpatient Hospital Stay (HOSPITAL_COMMUNITY): Payer: Medicare HMO

## 2019-03-13 ENCOUNTER — Other Ambulatory Visit: Payer: Self-pay

## 2019-03-13 ENCOUNTER — Encounter (HOSPITAL_COMMUNITY): Payer: Self-pay | Admitting: Emergency Medicine

## 2019-03-13 DIAGNOSIS — G253 Myoclonus: Secondary | ICD-10-CM | POA: Diagnosis not present

## 2019-03-13 DIAGNOSIS — E1122 Type 2 diabetes mellitus with diabetic chronic kidney disease: Secondary | ICD-10-CM | POA: Diagnosis not present

## 2019-03-13 DIAGNOSIS — K56699 Other intestinal obstruction unspecified as to partial versus complete obstruction: Secondary | ICD-10-CM | POA: Diagnosis not present

## 2019-03-13 DIAGNOSIS — R64 Cachexia: Secondary | ICD-10-CM | POA: Diagnosis present

## 2019-03-13 DIAGNOSIS — I255 Ischemic cardiomyopathy: Secondary | ICD-10-CM | POA: Diagnosis present

## 2019-03-13 DIAGNOSIS — Z7902 Long term (current) use of antithrombotics/antiplatelets: Secondary | ICD-10-CM

## 2019-03-13 DIAGNOSIS — E785 Hyperlipidemia, unspecified: Secondary | ICD-10-CM | POA: Diagnosis present

## 2019-03-13 DIAGNOSIS — E870 Hyperosmolality and hypernatremia: Secondary | ICD-10-CM | POA: Diagnosis not present

## 2019-03-13 DIAGNOSIS — Z9181 History of falling: Secondary | ICD-10-CM

## 2019-03-13 DIAGNOSIS — E872 Acidosis: Secondary | ICD-10-CM | POA: Diagnosis present

## 2019-03-13 DIAGNOSIS — I130 Hypertensive heart and chronic kidney disease with heart failure and stage 1 through stage 4 chronic kidney disease, or unspecified chronic kidney disease: Secondary | ICD-10-CM | POA: Diagnosis not present

## 2019-03-13 DIAGNOSIS — K56609 Unspecified intestinal obstruction, unspecified as to partial versus complete obstruction: Secondary | ICD-10-CM | POA: Diagnosis not present

## 2019-03-13 DIAGNOSIS — Z9049 Acquired absence of other specified parts of digestive tract: Secondary | ICD-10-CM

## 2019-03-13 DIAGNOSIS — R34 Anuria and oliguria: Secondary | ICD-10-CM | POA: Diagnosis present

## 2019-03-13 DIAGNOSIS — Z66 Do not resuscitate: Secondary | ICD-10-CM | POA: Diagnosis not present

## 2019-03-13 DIAGNOSIS — K859 Acute pancreatitis without necrosis or infection, unspecified: Secondary | ICD-10-CM | POA: Diagnosis not present

## 2019-03-13 DIAGNOSIS — J449 Chronic obstructive pulmonary disease, unspecified: Secondary | ICD-10-CM | POA: Diagnosis present

## 2019-03-13 DIAGNOSIS — F1910 Other psychoactive substance abuse, uncomplicated: Secondary | ICD-10-CM | POA: Diagnosis present

## 2019-03-13 DIAGNOSIS — I959 Hypotension, unspecified: Secondary | ICD-10-CM | POA: Diagnosis not present

## 2019-03-13 DIAGNOSIS — G9341 Metabolic encephalopathy: Secondary | ICD-10-CM | POA: Diagnosis not present

## 2019-03-13 DIAGNOSIS — Z0189 Encounter for other specified special examinations: Secondary | ICD-10-CM

## 2019-03-13 DIAGNOSIS — N179 Acute kidney failure, unspecified: Secondary | ICD-10-CM | POA: Diagnosis not present

## 2019-03-13 DIAGNOSIS — R188 Other ascites: Secondary | ICD-10-CM | POA: Diagnosis not present

## 2019-03-13 DIAGNOSIS — Z95820 Peripheral vascular angioplasty status with implants and grafts: Secondary | ICD-10-CM

## 2019-03-13 DIAGNOSIS — Z515 Encounter for palliative care: Secondary | ICD-10-CM | POA: Diagnosis not present

## 2019-03-13 DIAGNOSIS — E860 Dehydration: Secondary | ICD-10-CM | POA: Diagnosis present

## 2019-03-13 DIAGNOSIS — D61818 Other pancytopenia: Secondary | ICD-10-CM | POA: Diagnosis present

## 2019-03-13 DIAGNOSIS — G92 Toxic encephalopathy: Secondary | ICD-10-CM | POA: Diagnosis present

## 2019-03-13 DIAGNOSIS — E1151 Type 2 diabetes mellitus with diabetic peripheral angiopathy without gangrene: Secondary | ICD-10-CM | POA: Diagnosis present

## 2019-03-13 DIAGNOSIS — Z6820 Body mass index (BMI) 20.0-20.9, adult: Secondary | ICD-10-CM

## 2019-03-13 DIAGNOSIS — K559 Vascular disorder of intestine, unspecified: Secondary | ICD-10-CM | POA: Diagnosis not present

## 2019-03-13 DIAGNOSIS — Z8249 Family history of ischemic heart disease and other diseases of the circulatory system: Secondary | ICD-10-CM

## 2019-03-13 DIAGNOSIS — R0602 Shortness of breath: Secondary | ICD-10-CM | POA: Diagnosis not present

## 2019-03-13 DIAGNOSIS — Z20822 Contact with and (suspected) exposure to covid-19: Secondary | ICD-10-CM | POA: Diagnosis not present

## 2019-03-13 DIAGNOSIS — D631 Anemia in chronic kidney disease: Secondary | ICD-10-CM | POA: Diagnosis present

## 2019-03-13 DIAGNOSIS — Z7982 Long term (current) use of aspirin: Secondary | ICD-10-CM

## 2019-03-13 DIAGNOSIS — I739 Peripheral vascular disease, unspecified: Secondary | ICD-10-CM | POA: Diagnosis present

## 2019-03-13 DIAGNOSIS — Z95810 Presence of automatic (implantable) cardiac defibrillator: Secondary | ICD-10-CM

## 2019-03-13 DIAGNOSIS — Z85819 Personal history of malignant neoplasm of unspecified site of lip, oral cavity, and pharynx: Secondary | ICD-10-CM

## 2019-03-13 DIAGNOSIS — Z951 Presence of aortocoronary bypass graft: Secondary | ICD-10-CM

## 2019-03-13 DIAGNOSIS — F1510 Other stimulant abuse, uncomplicated: Secondary | ICD-10-CM | POA: Diagnosis present

## 2019-03-13 DIAGNOSIS — Z87891 Personal history of nicotine dependence: Secondary | ICD-10-CM

## 2019-03-13 DIAGNOSIS — Z833 Family history of diabetes mellitus: Secondary | ICD-10-CM

## 2019-03-13 DIAGNOSIS — R54 Age-related physical debility: Secondary | ICD-10-CM | POA: Diagnosis present

## 2019-03-13 DIAGNOSIS — R490 Dysphonia: Secondary | ICD-10-CM | POA: Diagnosis present

## 2019-03-13 DIAGNOSIS — F1110 Opioid abuse, uncomplicated: Secondary | ICD-10-CM | POA: Diagnosis present

## 2019-03-13 DIAGNOSIS — R1084 Generalized abdominal pain: Secondary | ICD-10-CM | POA: Diagnosis not present

## 2019-03-13 DIAGNOSIS — E43 Unspecified severe protein-calorie malnutrition: Secondary | ICD-10-CM | POA: Diagnosis present

## 2019-03-13 DIAGNOSIS — I5042 Chronic combined systolic (congestive) and diastolic (congestive) heart failure: Secondary | ICD-10-CM | POA: Diagnosis present

## 2019-03-13 DIAGNOSIS — D509 Iron deficiency anemia, unspecified: Secondary | ICD-10-CM | POA: Diagnosis present

## 2019-03-13 DIAGNOSIS — R140 Abdominal distension (gaseous): Secondary | ICD-10-CM | POA: Diagnosis not present

## 2019-03-13 DIAGNOSIS — I129 Hypertensive chronic kidney disease with stage 1 through stage 4 chronic kidney disease, or unspecified chronic kidney disease: Secondary | ICD-10-CM | POA: Diagnosis not present

## 2019-03-13 DIAGNOSIS — W19XXXA Unspecified fall, initial encounter: Secondary | ICD-10-CM | POA: Diagnosis not present

## 2019-03-13 DIAGNOSIS — F1410 Cocaine abuse, uncomplicated: Secondary | ICD-10-CM | POA: Diagnosis present

## 2019-03-13 DIAGNOSIS — K709 Alcoholic liver disease, unspecified: Secondary | ICD-10-CM | POA: Diagnosis present

## 2019-03-13 DIAGNOSIS — R4182 Altered mental status, unspecified: Secondary | ICD-10-CM | POA: Diagnosis not present

## 2019-03-13 DIAGNOSIS — R69 Illness, unspecified: Secondary | ICD-10-CM | POA: Diagnosis not present

## 2019-03-13 DIAGNOSIS — N184 Chronic kidney disease, stage 4 (severe): Secondary | ICD-10-CM | POA: Diagnosis not present

## 2019-03-13 DIAGNOSIS — I2510 Atherosclerotic heart disease of native coronary artery without angina pectoris: Secondary | ICD-10-CM | POA: Diagnosis not present

## 2019-03-13 DIAGNOSIS — W01198A Fall on same level from slipping, tripping and stumbling with subsequent striking against other object, initial encounter: Secondary | ICD-10-CM | POA: Diagnosis present

## 2019-03-13 DIAGNOSIS — Z4682 Encounter for fitting and adjustment of non-vascular catheter: Secondary | ICD-10-CM | POA: Diagnosis not present

## 2019-03-13 DIAGNOSIS — I252 Old myocardial infarction: Secondary | ICD-10-CM

## 2019-03-13 DIAGNOSIS — E875 Hyperkalemia: Secondary | ICD-10-CM | POA: Diagnosis not present

## 2019-03-13 DIAGNOSIS — Z79899 Other long term (current) drug therapy: Secondary | ICD-10-CM

## 2019-03-13 DIAGNOSIS — E1129 Type 2 diabetes mellitus with other diabetic kidney complication: Secondary | ICD-10-CM | POA: Diagnosis present

## 2019-03-13 DIAGNOSIS — R52 Pain, unspecified: Secondary | ICD-10-CM | POA: Diagnosis not present

## 2019-03-13 DIAGNOSIS — I10 Essential (primary) hypertension: Secondary | ICD-10-CM | POA: Diagnosis present

## 2019-03-13 HISTORY — DX: Unspecified intestinal obstruction, unspecified as to partial versus complete obstruction: K56.609

## 2019-03-13 LAB — CBC
HCT: 30.4 % — ABNORMAL LOW (ref 39.0–52.0)
Hemoglobin: 9.9 g/dL — ABNORMAL LOW (ref 13.0–17.0)
MCH: 30.8 pg (ref 26.0–34.0)
MCHC: 32.6 g/dL (ref 30.0–36.0)
MCV: 94.7 fL (ref 80.0–100.0)
Platelets: 148 10*3/uL — ABNORMAL LOW (ref 150–400)
RBC: 3.21 MIL/uL — ABNORMAL LOW (ref 4.22–5.81)
RDW: 14 % (ref 11.5–15.5)
WBC: 6.3 10*3/uL (ref 4.0–10.5)
nRBC: 0 % (ref 0.0–0.2)

## 2019-03-13 LAB — LIPID PANEL
Cholesterol: 118 mg/dL (ref 0–200)
HDL: 67 mg/dL (ref >40)
LDL Cholesterol: 41 mg/dL (ref 0–99)
Total CHOL/HDL Ratio: 1.8 RATIO
Triglycerides: 49 mg/dL (ref <150)
VLDL: 10 mg/dL (ref 0–40)

## 2019-03-13 LAB — IRON AND TIBC
Iron: 8 ug/dL — ABNORMAL LOW (ref 45–182)
Saturation Ratios: 4 % — ABNORMAL LOW (ref 17.9–39.5)
TIBC: 221 ug/dL — ABNORMAL LOW (ref 250–450)
UIBC: 213 ug/dL

## 2019-03-13 LAB — COMPREHENSIVE METABOLIC PANEL
ALT: 11 U/L (ref 0–44)
AST: 16 U/L (ref 15–41)
Albumin: 4.1 g/dL (ref 3.5–5.0)
Alkaline Phosphatase: 100 U/L (ref 38–126)
Anion gap: 13 (ref 5–15)
BUN: 79 mg/dL — ABNORMAL HIGH (ref 8–23)
CO2: 23 mmol/L (ref 22–32)
Calcium: 10.5 mg/dL — ABNORMAL HIGH (ref 8.9–10.3)
Chloride: 107 mmol/L (ref 98–111)
Creatinine, Ser: 4.85 mg/dL — ABNORMAL HIGH (ref 0.61–1.24)
GFR calc Af Amer: 13 mL/min — ABNORMAL LOW (ref >60)
GFR calc non Af Amer: 11 mL/min — ABNORMAL LOW (ref >60)
Glucose, Bld: 209 mg/dL — ABNORMAL HIGH (ref 70–99)
Potassium: 4.6 mmol/L (ref 3.5–5.1)
Sodium: 143 mmol/L (ref 135–145)
Total Bilirubin: 0.6 mg/dL (ref 0.3–1.2)
Total Protein: 7.1 g/dL (ref 6.5–8.1)

## 2019-03-13 LAB — RETICULOCYTES
Immature Retic Fract: 8.8 % (ref 2.3–15.9)
RBC.: 3.25 MIL/uL — ABNORMAL LOW (ref 4.22–5.81)
Retic Count, Absolute: 31.2 10*3/uL (ref 19.0–186.0)
Retic Ct Pct: 1 % (ref 0.4–3.1)

## 2019-03-13 LAB — LACTIC ACID, PLASMA: Lactic Acid, Venous: 2.2 mmol/L (ref 0.5–1.9)

## 2019-03-13 LAB — VITAMIN B12: Vitamin B-12: 336 pg/mL (ref 180–914)

## 2019-03-13 LAB — SARS CORONAVIRUS 2 (TAT 6-24 HRS): SARS Coronavirus 2: NEGATIVE

## 2019-03-13 LAB — POC SARS CORONAVIRUS 2 AG -  ED: SARS Coronavirus 2 Ag: NEGATIVE

## 2019-03-13 LAB — LIPASE, BLOOD: Lipase: 254 U/L — ABNORMAL HIGH (ref 11–51)

## 2019-03-13 MED ORDER — ONDANSETRON HCL 4 MG/2ML IJ SOLN: 4.0000 mg | Freq: Four times a day (QID) | INTRAMUSCULAR | Status: DC | PRN

## 2019-03-13 MED ORDER — DIATRIZOATE MEGLUMINE & SODIUM 66-10 % PO SOLN
90.0000 mL | Freq: Once | ORAL | Status: AC
  Administered 2019-03-14: 90 mL via NASOGASTRIC
  Filled 2019-03-13: qty 90

## 2019-03-13 MED ORDER — SODIUM CHLORIDE 0.9% FLUSH: 3.0000 mL | Freq: Once | INTRAVENOUS | Status: DC

## 2019-03-13 MED ORDER — ASPIRIN 300 MG RE SUPP
300.0000 mg | Freq: Every day | RECTAL | Status: DC
  Administered 2019-03-14 – 2019-03-15 (×2): 300 mg via RECTAL
  Filled 2019-03-13 (×4): qty 1

## 2019-03-13 MED ORDER — HEPARIN SODIUM (PORCINE) 5000 UNIT/ML IJ SOLN: 5000.0000 [IU] | Freq: Two times a day (BID) | INTRAMUSCULAR | Status: DC

## 2019-03-13 MED ORDER — LABETALOL HCL 5 MG/ML IV SOLN: 10.0000 mg | INTRAVENOUS | Status: DC | PRN

## 2019-03-13 MED ORDER — MORPHINE SULFATE (PF) 4 MG/ML IV SOLN: 4.0000 mg | Freq: Once | INTRAVENOUS | Status: DC

## 2019-03-13 MED ORDER — HYDROMORPHONE HCL 1 MG/ML IJ SOLN
1.0000 mg | INTRAMUSCULAR | Status: DC | PRN
  Administered 2019-03-14 – 2019-03-15 (×7): 1 mg via INTRAVENOUS
  Filled 2019-03-13 (×7): qty 1

## 2019-03-13 MED ORDER — HYDROMORPHONE HCL 1 MG/ML IJ SOLN: 0.5000 mg | INTRAMUSCULAR | Status: DC | PRN

## 2019-03-13 MED ORDER — HEPARIN SODIUM (PORCINE) 5000 UNIT/ML IJ SOLN
5000.0000 [IU] | Freq: Three times a day (TID) | INTRAMUSCULAR | Status: DC
  Administered 2019-03-13 – 2019-03-15 (×7): 5000 [IU] via SUBCUTANEOUS
  Filled 2019-03-13 (×7): qty 1

## 2019-03-13 MED ORDER — SODIUM CHLORIDE 0.9 % IV SOLN: INTRAVENOUS | Status: AC

## 2019-03-13 MED ORDER — ONDANSETRON HCL 4 MG/2ML IJ SOLN
4.0000 mg | Freq: Once | INTRAMUSCULAR | Status: AC
  Administered 2019-03-13: 4 mg via INTRAVENOUS
  Filled 2019-03-13: qty 2

## 2019-03-13 MED ORDER — MORPHINE SULFATE (PF) 4 MG/ML IV SOLN
4.0000 mg | Freq: Once | INTRAVENOUS | Status: AC
  Administered 2019-03-13: 4 mg via INTRAVENOUS
  Filled 2019-03-13: qty 1

## 2019-03-13 MED ORDER — LACTATED RINGERS IV BOLUS
500.0000 mL | Freq: Once | INTRAVENOUS | Status: AC
  Administered 2019-03-13: 500 mL via INTRAVENOUS
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
## 2019-03-13 NOTE — Plan of Care (Signed)
  Problem: Education: Goal: Knowledge of General Education information will improve Description Including pain rating scale, medication(s)/side effects and non-pharmacologic comfort measures Outcome: Progressing   

## 2019-03-13 NOTE — ED Triage Notes (Signed)
Pt arrives from via [facility] from home with c/c of generalized abd pain and constipation. Pt states he has not had a BM since late November.

## 2019-03-13 NOTE — ED Notes (Signed)
Pt also reports using crack last night and into the morning.
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
## 2019-03-13 NOTE — ED Notes (Signed)
Patient transported to CT 
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
## 2019-03-13 NOTE — ED Notes (Signed)
3E unable to take report at this time.  
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
## 2019-03-14 ENCOUNTER — Inpatient Hospital Stay (HOSPITAL_COMMUNITY): Payer: Medicare HMO

## 2019-03-14 DIAGNOSIS — J449 Chronic obstructive pulmonary disease, unspecified: Secondary | ICD-10-CM

## 2019-03-14 DIAGNOSIS — K859 Acute pancreatitis without necrosis or infection, unspecified: Secondary | ICD-10-CM

## 2019-03-14 DIAGNOSIS — E1122 Type 2 diabetes mellitus with diabetic chronic kidney disease: Secondary | ICD-10-CM

## 2019-03-14 LAB — URINALYSIS, ROUTINE W REFLEX MICROSCOPIC
Bilirubin Urine: NEGATIVE
Glucose, UA: NEGATIVE mg/dL
Hgb urine dipstick: NEGATIVE
Ketones, ur: NEGATIVE mg/dL
Leukocytes,Ua: NEGATIVE
Nitrite: NEGATIVE
Protein, ur: 100 mg/dL — AB
Specific Gravity, Urine: 1.017 (ref 1.005–1.030)
pH: 5 (ref 5.0–8.0)

## 2019-03-14 LAB — CBC
HCT: 30.2 % — ABNORMAL LOW (ref 39.0–52.0)
Hemoglobin: 9.7 g/dL — ABNORMAL LOW (ref 13.0–17.0)
MCH: 30.8 pg (ref 26.0–34.0)
MCHC: 32.1 g/dL (ref 30.0–36.0)
MCV: 95.9 fL (ref 80.0–100.0)
Platelets: 141 10*3/uL — ABNORMAL LOW (ref 150–400)
RBC: 3.15 MIL/uL — ABNORMAL LOW (ref 4.22–5.81)
RDW: 14.6 % (ref 11.5–15.5)
WBC: 5.7 10*3/uL (ref 4.0–10.5)
nRBC: 0 % (ref 0.0–0.2)

## 2019-03-14 LAB — HEMOGLOBIN A1C
Hgb A1c MFr Bld: 6.1 % — ABNORMAL HIGH (ref 4.8–5.6)
Mean Plasma Glucose: 128.37 mg/dL

## 2019-03-14 LAB — BASIC METABOLIC PANEL
Anion gap: 18 — ABNORMAL HIGH (ref 5–15)
BUN: 94 mg/dL — ABNORMAL HIGH (ref 8–23)
CO2: 22 mmol/L (ref 22–32)
Calcium: 9.9 mg/dL (ref 8.9–10.3)
Chloride: 105 mmol/L (ref 98–111)
Creatinine, Ser: 5.61 mg/dL — ABNORMAL HIGH (ref 0.61–1.24)
GFR calc Af Amer: 11 mL/min — ABNORMAL LOW (ref >60)
GFR calc non Af Amer: 9 mL/min — ABNORMAL LOW (ref >60)
Glucose, Bld: 188 mg/dL — ABNORMAL HIGH (ref 70–99)
Potassium: 4.8 mmol/L (ref 3.5–5.1)
Sodium: 145 mmol/L (ref 135–145)

## 2019-03-14 LAB — LACTIC ACID, PLASMA: Lactic Acid, Venous: 2 mmol/L (ref 0.5–1.9)

## 2019-03-14 LAB — RAPID URINE DRUG SCREEN, HOSP PERFORMED
Amphetamines: POSITIVE — AB
Barbiturates: NOT DETECTED
Benzodiazepines: NOT DETECTED
Cocaine: POSITIVE — AB
Opiates: POSITIVE — AB
Tetrahydrocannabinol: NOT DETECTED

## 2019-03-14 LAB — CREATININE, URINE, RANDOM: Creatinine, Urine: 219.8 mg/dL

## 2019-03-14 LAB — LIPASE, BLOOD: Lipase: 57 U/L — ABNORMAL HIGH (ref 11–51)

## 2019-03-14 LAB — SODIUM, URINE, RANDOM: Sodium, Ur: 16 mmol/L

## 2019-03-14 LAB — GLUCOSE, CAPILLARY
Glucose-Capillary: 143 mg/dL — ABNORMAL HIGH (ref 70–99)
Glucose-Capillary: 108 mg/dL — ABNORMAL HIGH (ref 70–99)

## 2019-03-14 MED ORDER — INSULIN ASPART 100 UNIT/ML ~~LOC~~ SOLN: 0.0000 [IU] | Freq: Every day | SUBCUTANEOUS | Status: DC

## 2019-03-14 MED ORDER — SODIUM CHLORIDE 0.9 % IV SOLN
510.0000 mg | INTRAVENOUS | Status: DC
  Administered 2019-03-14: 510 mg via INTRAVENOUS
  Filled 2019-03-14: qty 17

## 2019-03-14 MED ORDER — SODIUM CHLORIDE 0.9 % IV BOLUS
2000.0000 mL | Freq: Once | INTRAVENOUS | Status: AC
  Administered 2019-03-14: 2000 mL via INTRAVENOUS

## 2019-03-14 MED ORDER — INSULIN ASPART 100 UNIT/ML ~~LOC~~ SOLN
0.0000 [IU] | Freq: Three times a day (TID) | SUBCUTANEOUS | Status: DC
  Administered 2019-03-14: 1 [IU] via SUBCUTANEOUS

## 2019-03-14 NOTE — Progress Notes (Signed)
PT Cancellation Note  Patient Details Name: [name] MRN: [id number] DOB: 11-28-1945   Cancelled Treatment:    Reason Eval/Treat Not Completed: Fatigue/lethargy limiting ability to participate;Medical issues which prohibited therapy. PT attempted to see pt for evaluation however upon arrival pt is lethargic and moaning in pain, unable to follow any commands or verbally respond to PT other than moaning. PT will hold currently and follow up when the pt is better able to participate in skilled PT intervention.   Zenaida Niece 03/14/2019, 5:25 PM

## 2019-03-14 NOTE — Progress Notes (Signed)
Patient ID: [name], male   DOB: [birth year], 74 y.o.   MRN: [id number]       Subjective: Pt just had more pain medication so he does not speak as if he is somnolent, but moans and moves in bed as if he is in a lot of pain.  RN tells me his pain is coming from his abdomen as the patient can not or will not even speak currently.  ROS: unable  Objective: Vital signs in last 24 hours: Temp:  [97.5 F (36.4 C)-98.4 F (36.9 C)] 98 F (36.7 C) (01/02 0528) Pulse Rate:  [93-110] 110 (01/02 0528) Resp:  [14-22] 20 (01/02 0528) BP: (108-162)/(55-95) 116/72 (01/02 0528) SpO2:  [96 %-100 %] 96 % (01/02 0528) Weight:  [58.6 kg] 58.6 kg (01/02 0528) Last BM Date: 02/06/20  Intake/Output from previous day: 01/01 0701 - 01/02 0700 In: 1739.9 [I.V.:1149.9; NG/GT:90; IV Piggyback:500] Out: 0  Intake/Output this shift: No intake/output data recorded.  PE: Gen: clearly uncomfortable Heart: tachy, but regular Lungs: CTAB Abd: nondistended, but hard, possibly with guarding but difficult to tell if this is voluntary or not given his mental status currently.  Hypoactive BS.  NGT with about 500cc of bilious ouput.  Tender to palpation.  Lab Results:  Recent Labs    03/19/2019 0908 03/14/19 0433  WBC 6.3 5.7  HGB 9.9* 9.7*  HCT 30.4* 30.2*  PLT 148* 141*   BMET Recent Labs    04/07/2019 0908 03/14/19 0433  NA 143 145  K 4.6 4.8  CL 107 105  CO2 23 22  GLUCOSE 209* 188*  BUN 79* 94*  CREATININE 4.85* 5.61*  CALCIUM 10.5* 9.9   PT/INR No results for input(s): LABPROT, INR in the last 72 hours. CMP     Component Value Date/Time   NA 145 03/14/2019 0433   K 4.8 03/14/2019 0433   CL 105 03/14/2019 0433   CO2 22 03/14/2019 0433   GLUCOSE 188 (H) 03/14/2019 0433   BUN 94 (H) 03/14/2019 0433   CREATININE 5.61 (H) 03/14/2019 0433   CREATININE 3.58 (H) 07/07/2014 1341   CALCIUM 9.9 03/14/2019 0433   CALCIUM 9.8 04/02/2012 1146   PROT 7.1 03/21/2019 0908   ALBUMIN 4.1  04/09/2019 0908   AST 16 04/08/2019 0908   ALT 11 04/02/2019 0908   ALKPHOS 100 04/03/2019 0908   BILITOT 0.6 04/10/2019 0908   GFRNONAA 9 (L) 03/14/2019 0433   GFRAA 11 (L) 03/14/2019 0433   Lipase     Component Value Date/Time   LIPASE 57 (H) 03/14/2019 0433       Studies/Results: CT ABDOMEN PELVIS WO CONTRAST  Result Date: 04/06/2019 CLINICAL DATA:  Abdominal distension and pain. EXAM: CT ABDOMEN AND PELVIS WITHOUT CONTRAST TECHNIQUE: Multidetector CT imaging of the abdomen and pelvis was performed following the standard protocol without IV contrast. COMPARISON:  1/8/8. FINDINGS: Lower chest: No acute abnormality. Previous CABG procedure. Aortic atherosclerosis Hepatobiliary: No focal liver abnormality is seen. No gallstones, gallbladder wall thickening, or biliary dilatation. Pancreas: Unremarkable. No pancreatic ductal dilatation or surrounding inflammatory changes. Spleen: Normal in size without focal abnormality. Adrenals/Urinary Tract: Normal appearance of the adrenal glands. Cyst arises from upper pole of right kidney measuring 2.2 cm. Incompletely characterized without IV contrast. No kidney stones or hydronephrosis. Urinary bladder appears normal for degree of distention. Stomach/Bowel: Evaluation of bowel pathology is limited due to lack of IV and oral contrast material. Within the central abdomen there are dilated loops of small bowel, concerning  for obstruction. These measure up to 3.7 cm, image 48/6. Distal small bowel loops are normal in caliber. No abnormal dilatation of the colon. Distal colonic diverticula noted without acute inflammation. Vascular/Lymphatic: Aortic atherosclerosis. No aneurysm. No abdominopelvic adenopathy identified. Reproductive: Penile prosthesis. Prostate gland is unremarkable. Other: Upper abdominal ascites is identified. Musculoskeletal: Spondylosis within the lumbar spine. Previous ORIF of the right femur. IMPRESSION: 1. Examination is limited due to  lack of IV and oral contrast material. 2. Within the central abdomen there are multiple dilated loops of small bowel, concerning for small bowel obstruction. 3. Upper abdominal ascites. 4. Aortic atherosclerosis. Aortic Atherosclerosis (ICD10-I70.0). Electronically Signed   By: Kerby Moors M.D.   On: 03/31/2019 12:58   DG Abd Portable 1V-Small Bowel Protocol-Position Verification  Result Date: 04/03/2019 CLINICAL DATA:  NG tube placement EXAM: PORTABLE ABDOMEN - 1 VIEW COMPARISON:  04/29/2017, CT 04/04/2019 FINDINGS: Post sternotomy changes. Left-sided multi lead pacing device. Esophageal tube is folded back upon itself over the mid gastric region with the tip positioned near the GE junction. IMPRESSION: Esophageal tube is folded back upon itself over the mid gastric region with the tip positioned near the GE junction Electronically Signed   By: Donavan Foil M.D.   On: 03/26/2019 23:28   US ABDOMEN RUQ W/ELASTOGRAPHY  Result Date: 03/14/2019 CLINICAL DATA:  Pancreatitis. EXAM: US ABDOMEN LIMITED - RIGHT UPPER QUADRANT ULTRASOUND HEPATIC ELASTOGRAPHY TECHNIQUE: Sonography of the right upper quadrant was performed. In addition, ultrasound elastography evaluation of the liver was performed. A region of interest was placed within the right lobe of the liver. Following application of a compressive sonographic pulse, tissue compressibility was assessed. Multiple assessments were performed at the selected site. Median tissue compressibility was determined. Previously, hepatic stiffness was assessed by shear wave velocity. Based on recently published Society of Radiologists in Ultrasound consensus article, reporting is now recommended to be performed in the SI units of pressure (kiloPascals) representing hepatic stiffness/elasticity. The obtained result is compared to the published reference standards. (cACLD= compensated Advanced Chronic Liver Disease) COMPARISON:  None. FINDINGS: ULTRASOUND ABDOMEN LIMITED RIGHT  UPPER QUADRANT Gallbladder: No gallstones or wall thickening visualized. No sonographic Murphy sign noted. Common bile duct: Diameter: 4.9 mm Liver: No focal lesion identified. Within normal limits in parenchymal echogenicity. Portal vein is patent on color Doppler imaging with normal direction of blood flow towards the liver. Other: Perihepatic ascites. ULTRASOUND HEPATIC ELASTOGRAPHY Device: Siemens Helix VTQ Patient position: Supine Transducer 5 C1 Number of measurements: 10 Hepatic segment:  8 Median kPa: 6.7 IQR: 0.4 IQR/Median kPa ratio: 0.1 Data quality:  Reduced accuracy Diagnostic category: ?9 kPa: in the absence of other known clinical signs, rules out cACLD IMPRESSION: ULTRASOUND RUQ: 1. Ascites ULTRASOUND HEPATIC ELASTOGRAPHY: Median kPa:  6.7 Diagnostic category: ?9 kPa: in the absence of other known clinical signs, rules out Compensated Advanced Chronic Liver Disease The use of hepatic elastography is applicable to patients with viral hepatitis and non-alcoholic fatty liver disease. At this time, there is insufficient data for the referenced cut-off values and use in other causes of liver disease, including alcoholic liver disease. Patients, however, may be assessed by elastography and serve as their own reference standard/baseline. In patients with non-alcoholic liver disease, the values suggesting compensated advanced chronic liver disease (cACLD) may be lower, and patients may need additional testing with elasticity results of 7-9 kPa. Please note that abnormal hepatic elasticity and shear wave velocities may also be identified in clinical settings other than with hepatic fibrosis, such as:  acute hepatitis, elevated right heart and central venous pressures including use of beta blockers, veno-occlusive disease (Budd-Chiari), infiltrative processes such as mastocytosis/amyloidosis/infiltrative tumor/lymphoma, extrahepatic cholestasis, with hyperemia in the post-prandial state, and with liver  transplantation. Correlation with patient history, laboratory data, and clinical condition recommended. Diagnostic Categories: ?5 kPa: high probability of being normal ?9 kPa: in the absence of other known clinical signs, rules out cACLD >9 kPa and ?13 kPa: suggestive of cACLD, but needs further testing >13 kPa: highly suggestive of cACLD ?17 kPa: highly suggestive of cACLD with an increased probability of clinically significant portal hypertension Electronically Signed   By: Kerby Moors M.D.   On: 03/14/2019 07:17    Anti-infectives: Anti-infectives (From admission, onward)   None       Assessment/Plan cocaine abuse CKD - cr 5 today  Chemical pancreatitis - no evidence of pancreatitis on CT scan CAD - plavix on hold HTN H/O ischemic cardiomyopathy H/O pharyngeal cancer  Abdominal pain -CT shows some dilated loops of small bowel c/w ileus or SBO, but was noncontrasted -SBO protocol in place with NGT in place and 500 cc of output -patient having pain somewhat out of proportion to what I expect from an ileus or SBO.   Given use of cocaine could have some ischemia.  Will check a stat lactic acid level.   -will go ahead and get 8 hr delay x-ray now to see what may be going on -cont to hold plavix -will closely follow -cont hydration and bowel rest -WBC normal  FEN - NPO, NGT, IVFs VTE - plavix on hold, heparin prophylaxis ID - none currently   LOS: 1 day    Henreitta Cea , [facility] 03/14/2019, 9:01 AM Please see Amion for pager number during day hours 7:00am-4:30pm
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
## 2019-03-14 NOTE — Progress Notes (Addendum)
PROGRESS NOTE    [name]  IRC:[id number] DOB: 1945-05-18 DOA: 04/10/2019 PCP: Wenda Low, MD   Brief Narrative:  74 year old gentleman prior history of coronary artery disease s/p CABG, ischemic cardiomyopathy with preserved ejection fraction last echo from 2018.  S/p AICD, stage 4 CKD, hypertension was brought in by EMS for a fall.  Patient is confused and could not give [place] any history.  As per the daughter who saw him more than 6 months ago reports that he has been living in a boardinghouse along with a roommate and had relapsed on on drugs.  On arrival to ED patient reports he had constipation and and did not have a bowel movement for over a month also has nausea vomiting and abdominal pain.  CT of the abdomen shows small bowel obstruction versus ileus.  General surgery consulted recommended NG tube placement.  He was also found to be in acute renal failure and lactic acidosis.  Nephrology consulted for recommendations.   Assessment & Plan:   Active Problems:   Polysubstance abuse (HCC)   CAD (coronary artery disease)   COPD (chronic obstructive pulmonary disease) (HCC)   S/P ICD (internal cardiac defibrillator) procedure, 04/18/12, Medtronic device ro ICM   Essential hypertension   DM (diabetes mellitus), type 2 with renal complications (HCC)   CKD (chronic kidney disease) stage 4, GFR 15-29 ml/min (HCC)   PAD (peripheral artery disease) (HCC)   Biventricular ICD (implantable cardioverter-defibrillator) in place   Small bowel obstruction ([place])   AKI (acute kidney injury) ([place])   Small bowel obstruction/ ileus:  NG tube in place on suction.  NPO , IV FLUIDS. Bowel rest .  Further recommendations as per surgery.    Acute on stage 4  CKD Suspect from dehydration, poor oral intake/ Pre Renal etiology.  CT abd ruled out hydronephrosis.  UA, urine studies are pending.  Foley catheter placed and Only 180 ml urine out since the last 24 hours.     Acute metabolic and toxic  encephalopathy:  ? Sec to AKI vs drug withdrawals?    Lactic acidosis from Dehydration .  Hydrate and repeat .    Diabetes mellitus: type 2  CBG (last 3)  No results for input(s): GLUCAP in the last 72 hours.  With diabetic CKD.  Get A1c, order SSI.    H/o of CAD s/p CABG:  S/p AICD placment.  Pt denies any chest pain or sob at this time.  Aspirin and plavix on hold due to NPO.     Essential hypertension:  bp parameters are well controlled.    Tachycardia probably from abdominal pain.     Iron deficiency anemia/ anemia of chronic disease.  Iron level is 8. IV iron ordered.    Polysubstance abuse:  UDS positive for cocaine, amphetamines and opiates.  Watch for withdrawal symptoms.    Hyperlipidemia:  Statin hold for NPO.   Copd:  No wheezing heard.     Disposition:  Daughter would like to talk to CSW for placement, order placed.  PT/OT EVAL ordered.   Acute Pancreatitis:  Lipase levels improving.  Hydrate and continue with NPO.    DVT prophylaxis: scd's Code Status: full code.  Family Communication: discussed with daughter over the phone.  Disposition Plan: pending clinical improvement and further evaluation of AKI.    Consultants:   Nephrology.   General surgery.    Procedures: CT abd and pelvis   Antimicrobials: none   Subjective: Pt curled up, moaning in pain.  Objective: Vitals:   03/19/2019 2209 03/14/19 0022 03/14/19 0528 03/14/19 1309  BP: 131/80 124/65 116/72 121/66  Pulse: (!) 102 (!) 103 (!) 110 (!) 170  Resp: 18 19 20 15   Temp: 98.4 F (36.9 C) (!) 97.5 F (36.4 C) 98 F (36.7 C) 99.5 F (37.5 C)  TempSrc: Oral Oral Oral Rectal  SpO2: 100% 97% 96% 94%  Weight:   58.6 kg   Height:        Intake/Output Summary (Last 24 hours) at 03/14/2019 1437 Last data filed at 03/14/2019 1300 Gross per 24 hour  Intake 1239.86 ml  Output 180 ml  Net 1059.86 ml   Filed Weights   03/14/19 0528  Weight: 58.6 kg     Examination:  General exam: moderate distress from abdominal pain.  Respiratory system: Clear to auscultation. Respiratory effort normal. Cardiovascular system: S1 & S2 heard, RRR.No pedal edema. Gastrointestinal system: Abdomen is soft, with generalized tenderness, mildly distended, bowel sounds minimal.  Central nervous system: alert and moaning . Not following commands,  Extremities: no pedal edema.  Skin: No rashes,  Psychiatry: cannot be assessed,     Data Reviewed: I have personally reviewed following labs and imaging studies  CBC: Recent Labs  Lab 03/22/2019 0908 03/14/19 0433  WBC 6.3 5.7  HGB 9.9* 9.7*  HCT 30.4* 30.2*  MCV 94.7 95.9  PLT 148* 035*   Basic Metabolic Panel: Recent Labs  Lab 03/17/2019 0908 03/14/19 0433  NA 143 145  K 4.6 4.8  CL 107 105  CO2 23 22  GLUCOSE 209* 188*  BUN 79* 94*  CREATININE 4.85* 5.61*  CALCIUM 10.5* 9.9   GFR: Estimated Creatinine Clearance: 9.9 mL/min (A) (by C-G formula based on SCr of 5.61 mg/dL (H)). Liver Function Tests: Recent Labs  Lab 03/28/2019 0908  AST 16  ALT 11  ALKPHOS 100  BILITOT 0.6  PROT 7.1  ALBUMIN 4.1   Recent Labs  Lab 04/10/2019 0908 03/14/19 0433  LIPASE 254* 57*   No results for input(s): AMMONIA in the last 168 hours. Coagulation Profile: No results for input(s): INR, PROTIME in the last 168 hours. Cardiac Enzymes: No results for input(s): CKTOTAL, CKMB, CKMBINDEX, TROPONINI in the last 168 hours. BNP (last 3 results) No results for input(s): PROBNP in the last 8760 hours. HbA1C: No results for input(s): HGBA1C in the last 72 hours. CBG: No results for input(s): GLUCAP in the last 168 hours. Lipid Profile: Recent Labs    03/29/2019 1715  CHOL 118  HDL 67  LDLCALC 41  TRIG 49  CHOLHDL 1.8   Thyroid Function Tests: No results for input(s): TSH, T4TOTAL, FREET4, T3FREE, THYROIDAB in the last 72 hours. Anemia Panel: Recent Labs    03/22/2019 1715  VITAMINB12 336  TIBC  221*  IRON 8*  RETICCTPCT 1.0   Sepsis Labs: Recent Labs  Lab 03/15/2019 1140 03/14/19 0941  LATICACIDVEN 2.2* 2.0*    Recent Results (from the past 240 hour(s))  SARS CORONAVIRUS 2 (TAT 6-24 HRS) Nasopharyngeal Nasopharyngeal Swab     Status: None   Collection Time: 04/06/2019  1:47 PM   Specimen: Nasopharyngeal Swab  Result Value Ref Range Status   SARS Coronavirus 2 NEGATIVE NEGATIVE Final    Comment: (NOTE) SARS-CoV-2 target nucleic acids are NOT DETECTED. The SARS-CoV-2 RNA is generally detectable in upper and lower respiratory specimens during the acute phase of infection. Negative results do not preclude SARS-CoV-2 infection, do not rule out co-infections with other pathogens, and should not  be used as the sole basis for treatment or other patient management decisions. Negative results must be combined with clinical observations, patient history, and epidemiological information. The expected result is Negative. Fact Sheet for Patients: SugarRoll.be Fact Sheet for Healthcare Providers: https://www.woods-mathews.com/ This test is not yet approved or cleared by the [place] FDA and  has been authorized for detection and/or diagnosis of SARS-CoV-2 by FDA under an Emergency Use Authorization (EUA). This EUA will remain  in effect (meaning this test can be used) for the duration of the COVID-19 declaration under Section 56 4(b)(1) of the Act, 21 U.S.C. section 360bbb-3(b)(1), unless the authorization is terminated or revoked sooner. Performed at [facility], [place] [address], [place], [place] [address]          Radiology Studies: CT ABDOMEN PELVIS WO CONTRAST  Result Date: 04/08/2019 CLINICAL DATA:  Abdominal distension and pain. EXAM: CT ABDOMEN AND PELVIS WITHOUT CONTRAST TECHNIQUE: Multidetector CT imaging of the abdomen and pelvis was performed following the standard protocol without IV contrast. COMPARISON:  1/8/8.  FINDINGS: Lower chest: No acute abnormality. Previous CABG procedure. Aortic atherosclerosis Hepatobiliary: No focal liver abnormality is seen. No gallstones, gallbladder wall thickening, or biliary dilatation. Pancreas: Unremarkable. No pancreatic ductal dilatation or surrounding inflammatory changes. Spleen: Normal in size without focal abnormality. Adrenals/Urinary Tract: Normal appearance of the adrenal glands. Cyst arises from upper pole of right kidney measuring 2.2 cm. Incompletely characterized without IV contrast. No kidney stones or hydronephrosis. Urinary bladder appears normal for degree of distention. Stomach/Bowel: Evaluation of bowel pathology is limited due to lack of IV and oral contrast material. Within the central abdomen there are dilated loops of small bowel, concerning for obstruction. These measure up to 3.7 cm, image 48/6. Distal small bowel loops are normal in caliber. No abnormal dilatation of the colon. Distal colonic diverticula noted without acute inflammation. Vascular/Lymphatic: Aortic atherosclerosis. No aneurysm. No abdominopelvic adenopathy identified. Reproductive: Penile prosthesis. Prostate gland is unremarkable. Other: Upper abdominal ascites is identified. Musculoskeletal: Spondylosis within the lumbar spine. Previous ORIF of the right femur. IMPRESSION: 1. Examination is limited due to lack of IV and oral contrast material. 2. Within the central abdomen there are multiple dilated loops of small bowel, concerning for small bowel obstruction. 3. Upper abdominal ascites. 4. Aortic atherosclerosis. Aortic Atherosclerosis (ICD10-I70.0). Electronically Signed   By: Kerby Moors M.D.   On: 03/24/2019 12:58   DG Abd Portable 1V-Small Bowel Obstruction Protocol-initial, 8 hr delay  Result Date: 03/14/2019 CLINICAL DATA:  Small-bowel obstruction. EXAM: PORTABLE ABDOMEN - 1 VIEW COMPARISON:  04/09/2019; CT abdomen pelvis-03/21/2019 FINDINGS: Enteric tube tip and side port projected  the expected location of the gastric fundus, unchanged. Ingested dilute enteric contrast remains within several dilated patulous appearing loops of small bowel with index loop of small bowel within the left mid hemiabdomen measuring approximately 4.2 cm in diameter. There is no definitive passage of contrast to the level of the colon. No supine evidence of pneumoperitoneum. No pneumatosis or portal venous gas Limited visualization of lower thorax demonstrates the distal tips of a 3 lead AICD/pacemaker. Post median sternotomy. Shrapnel overlies the left groin. Post dynamic screw fixation of the right femoral neck, incompletely imaged. Stigmata of dish within the thoracic and lumbar spine. IMPRESSION: Dilute enteric contrast remains within several dilated loops of proximal small bowel compatible with provided history of small-bowel obstruction. Electronically Signed   By: Sandi Mariscal M.D.   On: 03/14/2019 10:13   DG Abd Portable 1V-Small Bowel Protocol-Position  Verification  Result Date: 03/30/2019 CLINICAL DATA:  NG tube placement EXAM: PORTABLE ABDOMEN - 1 VIEW COMPARISON:  04/29/2017, CT 03/15/2019 FINDINGS: Post sternotomy changes. Left-sided multi lead pacing device. Esophageal tube is folded back upon itself over the mid gastric region with the tip positioned near the GE junction. IMPRESSION: Esophageal tube is folded back upon itself over the mid gastric region with the tip positioned near the GE junction Electronically Signed   By: Donavan Foil M.D.   On: 03/26/2019 23:28   US ABDOMEN RUQ W/ELASTOGRAPHY  Result Date: 03/14/2019 CLINICAL DATA:  Pancreatitis. EXAM: US ABDOMEN LIMITED - RIGHT UPPER QUADRANT ULTRASOUND HEPATIC ELASTOGRAPHY TECHNIQUE: Sonography of the right upper quadrant was performed. In addition, ultrasound elastography evaluation of the liver was performed. A region of interest was placed within the right lobe of the liver. Following application of a compressive sonographic pulse,  tissue compressibility was assessed. Multiple assessments were performed at the selected site. Median tissue compressibility was determined. Previously, hepatic stiffness was assessed by shear wave velocity. Based on recently published Society of Radiologists in Ultrasound consensus article, reporting is now recommended to be performed in the SI units of pressure (kiloPascals) representing hepatic stiffness/elasticity. The obtained result is compared to the published reference standards. (cACLD= compensated Advanced Chronic Liver Disease) COMPARISON:  None. FINDINGS: ULTRASOUND ABDOMEN LIMITED RIGHT UPPER QUADRANT Gallbladder: No gallstones or wall thickening visualized. No sonographic Murphy sign noted. Common bile duct: Diameter: 4.9 mm Liver: No focal lesion identified. Within normal limits in parenchymal echogenicity. Portal vein is patent on color Doppler imaging with normal direction of blood flow towards the liver. Other: Perihepatic ascites. ULTRASOUND HEPATIC ELASTOGRAPHY Device: Siemens Helix VTQ Patient position: Supine Transducer 5 C1 Number of measurements: 10 Hepatic segment:  8 Median kPa: 6.7 IQR: 0.4 IQR/Median kPa ratio: 0.1 Data quality:  Reduced accuracy Diagnostic category: ?9 kPa: in the absence of other known clinical signs, rules out cACLD IMPRESSION: ULTRASOUND RUQ: 1. Ascites ULTRASOUND HEPATIC ELASTOGRAPHY: Median kPa:  6.7 Diagnostic category: ?9 kPa: in the absence of other known clinical signs, rules out Compensated Advanced Chronic Liver Disease The use of hepatic elastography is applicable to patients with viral hepatitis and non-alcoholic fatty liver disease. At this time, there is insufficient data for the referenced cut-off values and use in other causes of liver disease, including alcoholic liver disease. Patients, however, may be assessed by elastography and serve as their own reference standard/baseline. In patients with non-alcoholic liver disease, the values suggesting  compensated advanced chronic liver disease (cACLD) may be lower, and patients may need additional testing with elasticity results of 7-9 kPa. Please note that abnormal hepatic elasticity and shear wave velocities may also be identified in clinical settings other than with hepatic fibrosis, such as: acute hepatitis, elevated right heart and central venous pressures including use of beta blockers, veno-occlusive disease (Budd-Chiari), infiltrative processes such as mastocytosis/amyloidosis/infiltrative tumor/lymphoma, extrahepatic cholestasis, with hyperemia in the post-prandial state, and with liver transplantation. Correlation with patient history, laboratory data, and clinical condition recommended. Diagnostic Categories: ?5 kPa: high probability of being normal ?9 kPa: in the absence of other known clinical signs, rules out cACLD >9 kPa and ?13 kPa: suggestive of cACLD, but needs further testing >13 kPa: highly suggestive of cACLD ?17 kPa: highly suggestive of cACLD with an increased probability of clinically significant portal hypertension Electronically Signed   By: Kerby Moors M.D.   On: 03/14/2019 07:17        Scheduled Meds: . aspirin  300 mg Rectal  Daily  . heparin  5,000 Units Subcutaneous Q8H  .  morphine injection  4 mg Intravenous Once  . sodium chloride flush  3 mL Intravenous Once   Continuous Infusions: . sodium chloride 125 mL/hr at 03/14/19 1202  . ferumoxytol 510 mg (03/14/19 1413)     LOS: 1 day        Hosie Poisson, MD [facility]

## 2019-03-15 ENCOUNTER — Inpatient Hospital Stay (HOSPITAL_COMMUNITY): Payer: Medicare HMO

## 2019-03-15 DIAGNOSIS — K56609 Unspecified intestinal obstruction, unspecified as to partial versus complete obstruction: Principal | ICD-10-CM

## 2019-03-15 DIAGNOSIS — N179 Acute kidney failure, unspecified: Secondary | ICD-10-CM

## 2019-03-15 DIAGNOSIS — G9341 Metabolic encephalopathy: Secondary | ICD-10-CM

## 2019-03-15 DIAGNOSIS — E870 Hyperosmolality and hypernatremia: Secondary | ICD-10-CM

## 2019-03-15 DIAGNOSIS — N184 Chronic kidney disease, stage 4 (severe): Secondary | ICD-10-CM

## 2019-03-15 DIAGNOSIS — E872 Acidosis: Secondary | ICD-10-CM

## 2019-03-15 DIAGNOSIS — F1910 Other psychoactive substance abuse, uncomplicated: Secondary | ICD-10-CM

## 2019-03-15 LAB — BLOOD GAS, ARTERIAL
Acid-base deficit: 6.7 mmol/L — ABNORMAL HIGH (ref 0.0–2.0)
Bicarbonate: 19.2 mmol/L — ABNORMAL LOW (ref 20.0–28.0)
FIO2: 28
O2 Saturation: 96.9 %
Patient temperature: 37
pCO2 arterial: 44.5 mmHg (ref 32.0–48.0)
pH, Arterial: 7.258 — ABNORMAL LOW (ref 7.350–7.450)
pO2, Arterial: 95.5 mmHg (ref 83.0–108.0)

## 2019-03-15 LAB — GLUCOSE, CAPILLARY
Glucose-Capillary: 109 mg/dL — ABNORMAL HIGH (ref 70–99)
Glucose-Capillary: 119 mg/dL — ABNORMAL HIGH (ref 70–99)
Glucose-Capillary: 52 mg/dL — ABNORMAL LOW (ref 70–99)
Glucose-Capillary: 71 mg/dL (ref 70–99)
Glucose-Capillary: 71 mg/dL (ref 70–99)
Glucose-Capillary: 79 mg/dL (ref 70–99)
Glucose-Capillary: 82 mg/dL (ref 70–99)
Glucose-Capillary: 99 mg/dL (ref 70–99)

## 2019-03-15 LAB — CBC
HCT: 29.2 % — ABNORMAL LOW (ref 39.0–52.0)
Hemoglobin: 9 g/dL — ABNORMAL LOW (ref 13.0–17.0)
MCH: 30.5 pg (ref 26.0–34.0)
MCHC: 30.8 g/dL (ref 30.0–36.0)
MCV: 99 fL (ref 80.0–100.0)
Platelets: 110 10*3/uL — ABNORMAL LOW (ref 150–400)
RBC: 2.95 MIL/uL — ABNORMAL LOW (ref 4.22–5.81)
RDW: 14.7 % (ref 11.5–15.5)
WBC: 2.3 10*3/uL — ABNORMAL LOW (ref 4.0–10.5)
nRBC: 0 % (ref 0.0–0.2)

## 2019-03-15 LAB — POTASSIUM
Potassium: 4.1 mmol/L (ref 3.5–5.1)
Potassium: 4.9 mmol/L (ref 3.5–5.1)
Potassium: 5.2 mmol/L — ABNORMAL HIGH (ref 3.5–5.1)

## 2019-03-15 LAB — RENAL FUNCTION PANEL
Albumin: 2.9 g/dL — ABNORMAL LOW (ref 3.5–5.0)
Anion gap: 16 — ABNORMAL HIGH (ref 5–15)
BUN: 117 mg/dL — ABNORMAL HIGH (ref 8–23)
CO2: 16 mmol/L — ABNORMAL LOW (ref 22–32)
Calcium: 9 mg/dL (ref 8.9–10.3)
Chloride: 118 mmol/L — ABNORMAL HIGH (ref 98–111)
Creatinine, Ser: 6.36 mg/dL — ABNORMAL HIGH (ref 0.61–1.24)
GFR calc Af Amer: 9 mL/min — ABNORMAL LOW (ref >60)
GFR calc non Af Amer: 8 mL/min — ABNORMAL LOW (ref >60)
Glucose, Bld: 86 mg/dL (ref 70–99)
Phosphorus: 5.8 mg/dL — ABNORMAL HIGH (ref 2.5–4.6)
Potassium: 5.9 mmol/L — ABNORMAL HIGH (ref 3.5–5.1)
Sodium: 150 mmol/L — ABNORMAL HIGH (ref 135–145)

## 2019-03-15 LAB — MRSA PCR SCREENING: MRSA by PCR: NEGATIVE

## 2019-03-15 LAB — TROPONIN I (HIGH SENSITIVITY)
Troponin I (High Sensitivity): 137 ng/L (ref <18)
Troponin I (High Sensitivity): 141 ng/L (ref <18)

## 2019-03-15 LAB — FERRITIN: Ferritin: 610 ng/mL — ABNORMAL HIGH (ref 24–336)

## 2019-03-15 LAB — LACTIC ACID, PLASMA
Lactic Acid, Venous: 2.7 mmol/L (ref 0.5–1.9)
Lactic Acid, Venous: 2.8 mmol/L (ref 0.5–1.9)

## 2019-03-15 LAB — LIPASE, BLOOD: Lipase: 18 U/L (ref 11–51)

## 2019-03-15 MED ORDER — INSULIN ASPART 100 UNIT/ML ~~LOC~~ SOLN: 0.0000 [IU] | SUBCUTANEOUS | Status: DC

## 2019-03-15 MED ORDER — INSULIN ASPART 100 UNIT/ML IV SOLN
5.0000 [IU] | Freq: Once | INTRAVENOUS | Status: AC
  Administered 2019-03-15: 5 [IU] via INTRAVENOUS

## 2019-03-15 MED ORDER — SODIUM CHLORIDE 0.45 % IV BOLUS
1000.0000 mL | Freq: Once | INTRAVENOUS | Status: AC
  Administered 2019-03-15: 1000 mL via INTRAVENOUS

## 2019-03-15 MED ORDER — PANTOPRAZOLE SODIUM 40 MG IV SOLR
40.0000 mg | INTRAVENOUS | Status: DC
  Administered 2019-03-15: 40 mg via INTRAVENOUS
  Filled 2019-03-15: qty 40

## 2019-03-15 MED ORDER — CHLORHEXIDINE GLUCONATE CLOTH 2 % EX PADS
6.0000 | MEDICATED_PAD | Freq: Every day | CUTANEOUS | Status: DC
  Administered 2019-03-15: 6 via TOPICAL

## 2019-03-15 MED ORDER — SODIUM CHLORIDE 0.9 % IV BOLUS
1000.0000 mL | Freq: Once | INTRAVENOUS | Status: AC
  Administered 2019-03-15: 1000 mL via INTRAVENOUS

## 2019-03-15 MED ORDER — CALCIUM GLUCONATE-NACL 1-0.675 GM/50ML-% IV SOLN
1.0000 g | Freq: Once | INTRAVENOUS | Status: AC
  Administered 2019-03-15: 1000 mg via INTRAVENOUS
  Filled 2019-03-15: qty 50

## 2019-03-15 MED ORDER — SODIUM CHLORIDE 0.9 % IV SOLN: INTRAVENOUS | Status: DC

## 2019-03-15 MED ORDER — CALCIUM GLUCONATE 10 % IV SOLN
1.0000 g | Freq: Once | INTRAVENOUS | Status: DC
  Filled 2019-03-15: qty 10

## 2019-03-15 MED ORDER — DEXTROSE 50 % IV SOLN
1.0000 | Freq: Once | INTRAVENOUS | Status: AC
  Administered 2019-03-15: 50 mL via INTRAVENOUS
  Filled 2019-03-15: qty 50

## 2019-03-15 MED ORDER — SODIUM CHLORIDE 0.9 % IV BOLUS: 1000.0000 mL | Freq: Once | INTRAVENOUS | Status: DC

## 2019-03-15 MED ORDER — HYDROMORPHONE HCL 1 MG/ML IJ SOLN
0.5000 mg | INTRAMUSCULAR | Status: DC | PRN
  Administered 2019-03-15 (×3): 0.5 mg via INTRAVENOUS
  Filled 2019-03-15: qty 0.5
  Filled 2019-03-15: qty 1
  Filled 2019-03-15: qty 0.5

## 2019-03-15 MED ORDER — DEXTROSE 50 % IV SOLN
INTRAVENOUS | Status: AC
  Administered 2019-03-15: 50 mL
  Filled 2019-03-15: qty 50

## 2019-03-15 MED ORDER — PIPERACILLIN-TAZOBACTAM IN DEX 2-0.25 GM/50ML IV SOLN
2.2500 g | Freq: Three times a day (TID) | INTRAVENOUS | Status: DC
  Administered 2019-03-15: 2.25 g via INTRAVENOUS
  Filled 2019-03-15 (×5): qty 50

## 2019-03-15 MED ORDER — SODIUM CHLORIDE 0.45 % IV SOLN: INTRAVENOUS | Status: DC
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
## 2019-03-15 NOTE — Plan of Care (Signed)
  Problem: Safety: Goal: Ability to remain free from injury will improve Outcome: Progressing   

## 2019-03-15 NOTE — Progress Notes (Signed)
Attempted report to 70M. RN in room hanging blood per Network engineer. Gave extension and will receive a call back

## 2019-03-15 NOTE — Significant Event (Signed)
Rapid Response Event Note  Overview:Called with concern for tachycardia and hypotension. Pt HR-130s, SBP-80s, neuro status at baseline. Time Called: 0615 Arrival Time: 0620 Event Type: Hypotension  Initial Focused Assessment: Pt laying in bed moaning, alert but will not speak. This is baseline per RN.  Skin warm and dry. HR-130s, BP-136/80, RR-18, SpO2-95% on RA.  Interventions: No RRT interventions at this time.  Plan of Care (if not transferred): BP better at this time. HR unchanged since admission. Renal note mentions NS @ 100-150cc/hr for hydration but there was not order placed in computer so pt did not receive continuous IVF overnight, just the 2L bolus that was ordered. Recommend following up with AM MD regarding need for cont IVF.  Event Summary: Name of Physician Notified: Kennon Holter, NP at (PTA RRT)    at    Outcome: Stayed in room and stabalized  Event End Time: 0640  [place]

## 2019-03-15 NOTE — Progress Notes (Signed)
   Vital Signs MEWS/VS Documentation      03/15/2019 0718 03/15/2019 0731 03/15/2019 0746 03/15/2019 0910   MEWS Score:  2  2  2  2    MEWS Score Color:  Yellow  Yellow  Yellow  Yellow   Pulse:  (!) 126  (!) 127  (!) 129  --   BP:  109/72  113/77  102/62  --   Level of Consciousness:  --  --  --  Alert       Non-acute change.    [name] 03/15/2019,10:03 AM

## 2019-03-15 NOTE — Progress Notes (Signed)
Report given to [facility] ICU RN.
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
## 2019-03-15 NOTE — Progress Notes (Signed)
   Vital Signs MEWS/VS Documentation      03/15/2019 0746 03/15/2019 0910 03/15/2019 1015 03/15/2019 1158   MEWS Score:  2  2  3  5    MEWS Score Color:  Yellow  Yellow  Yellow  Red   Resp:  --  --  --  (!) 21   Pulse:  (!) 129  --  (!) 132  (!) 131   BP:  102/62  --  101/69  98/63   O2 Device:  --  --  Nasal Cannula  Nasal Cannula   O2 Flow Rate (L/min):  --  --  2 L/min  2 L/min   Level of Consciousness:  --  Alert  --  --      This is a non-acute changed. Provider, charge RN, and RRT nurse aware of patient condition. Currently awaiting open bed in ICU. Will set vital signs frequency per protocol.      Horris Latino 03/15/2019,12:04 PM

## 2019-03-15 NOTE — Progress Notes (Signed)
PT Cancellation Note  Patient Details Name: Angeldejesus Callaham MRN: [id number] DOB: 02/23/1946   Cancelled Treatment:    Reason Eval/Treat Not Completed: Patient not medically ready;Medical issues which prohibited therapy;Fatigue/lethargy limiting ability to participate. Pt remains unable to follow commands and moaning in pain for most of morning per discussion with RN. PT will follow up tomorrow, if pt remains unable to participate in skilled PT intervention acute PT will sign off.   Zenaida Niece 03/15/2019, 1:02 PM

## 2019-03-15 NOTE — Progress Notes (Signed)
Clinical status unchanged. Awaiting ICU bed. I spoke with his daughter who is conferring with his siblings. At this time they do not wish for surgery to be pursued, with concern expressed that he may not survive an operation. I share this concern. However, reiterated the point that if the source of his clinical decline is abdominal/ ischemic bowel, he will only worsen and become more high risk for mortality (with or without surgery) as that evolves. Ms. [name] expressed understanding and will remain in communication with [place] if the family's wishes change. They are currently deciding on code status and will update when this is settled.

## 2019-03-15 NOTE — Progress Notes (Signed)
Addendum  Patient gravely ill.  After multiple and extensive communication with multiple consultants (Nephrology, PCCM, CCS) and family, patient was transferred to ICU.  Since then Dr. Jonnie Finner, Nephrology discussed with family (see his note) who have agreed upon supportive care i.e. IV antibiotics, fluids) and have agreed on DNR, no dialysis or life support or other heroic measures.  Palliative care consulted and I discussed with them for a possible early consultation to support family.  PCCM has written transfer orders to medical bed.  Vernell Leep, MD, [place], [facility]. [facility]  To contact the attending provider between 7A-7P or the covering provider during after hours 7P-7A, please log into the web site [email or web address] and access using universal [place] password for that web site. If you do not have the password, please call the hospital operator.

## 2019-03-15 NOTE — Progress Notes (Signed)
Pharmacy Antibiotic Note  [name] is a 74 y.o. malewith PMH of CAD, s/p CABG x3 in 2006, ischemic cardiomyopathy (LV EF improved from 35% in 2016 to 55% in 2018), chronic combined CHF, AICD, CAD s/p right ICA stenting 2015, stage III-IV CKD, DM 2, HLD, HTN, polysubstance abuse relapse. He was admitted on 03/24/2019 for worsening abdominal pain, progressive constipation, and no BM in recent time. There is concern for intra-abdominal infection.  Pharmacy has been consulted for zosyn dosing.  Patient has very significant renal insufficiency with AKI associated with anion gap metabolic acidosis and hyperkalemia. Patient is waiting for ICU bed to initiate CRRT. WBC is low at 2.3. Lactate is elevated and increasing 2.0 >> 2.7. Patient is tachycardiac and slightly hypotensive.  Plan: Zosyn 2.25 grams IV q8 hours Monitor renal function, renal replacement therapy, WBC, temperature, and clinical status. Adjust dose as needed.  Height: 5\' 8"  (172.7 cm) Weight: 134 lb 11.2 oz (61.1 kg) IBW/kg (Calculated) : 68.4  Temp (24hrs), Avg:99.1 F (37.3 C), Min:98.7 F (37.1 C), Max:99.5 F (37.5 C)  Recent Labs  Lab 04/05/2019 0908 03/15/2019 1140 03/14/19 0433 03/14/19 0941 03/15/19 0836  WBC 6.3  --  5.7  --  2.3*  CREATININE 4.85*  --  5.61*  --  6.36*  LATICACIDVEN  --  2.2*  --  2.0* 2.7*    Estimated Creatinine Clearance: 9.1 mL/min (A) (by C-G formula based on SCr of 6.36 mg/dL (H)).    No Known Allergies  Antimicrobials this admission: Zosyn 1/3 >>   Microbiology results: 1/3 COVID: Negative  Thank you for allowing pharmacy to be a part of this patient's care.  Sherren Kerns, PharmD PGY1 Acute Care Pharmacy Resident 03/15/2019 12:31 PM

## 2019-03-15 NOTE — Progress Notes (Signed)
Will transfer patient to progressive per Dr. Chase Caller request as patient's family want supportive care with No dialysis, life support, or heroic measures. Therefore patient does not need icu level of care at this time.   Lars Mage, MD Internal Medicine PGY3 YOYOO:[phone] 03/15/2019, 3:11 PM

## 2019-03-15 NOTE — Progress Notes (Addendum)
Pt is very sick and elderly and not sure RRT is going to help in the long-run given how sick he is at this time.  I have spoken w/ the daughter and recommended supportive care (IV abx, fluids) but no dialysis, life support or other heroic measures etc, and will see how he does. The daughter said that is what the family would like.  DNR order written and palliative already consulted.   Kelly Splinter, MD 03/15/2019, 2:26 PM

## 2019-03-15 NOTE — Progress Notes (Addendum)
[facility] Progress Note  Subjective: not doing any better, B/Cr are up, hypotensive , tachy and tachypneic  Vitals:   03/15/19 0731 03/15/19 0746 03/15/19 1015 03/15/19 1158  BP: 113/77 102/62 101/69 98/63  Pulse: (!) 127 (!) 129 (!) 132 (!) 131  Resp:    (!) 21  Temp:      TempSrc:      SpO2: (!) 87% (!) 88% 100% 99%  Weight:      Height:        Exam: Gen pt confused, lethargic, unable to understand No rash, cyanosis or gangrene Sclera anicteric, throat clear No jvd or bruits Chest clear bilat no rales or wheezing RRR no MRG tachy Abd soft ntnd no mass or ascites +bs  GU normal male w/ foley cath Ext no LE or UE edema Neuro as above    Home meds:  - allopurinol 300  - amlodipine 5/ carvedilol 12.5 bid/ hydralazine 100 bid/ torsemide 80 qd/ kdur 20  - trazodone 150 hs prn  - clopidogrel 75/ atorvastatin 40/ aspirin 81/ isosorbide mono 60    Date              Creat               eGFR   2008- 2015    1.8- 2.1   2016- 2019    2.3- 3.0            16- 30, stage IV CKD   03/22/2019        4.8   03/14/2019        5.6  UA 03/14/2019 - negative, prot 100 CT abd 1/1/202 - showed normal appearing kidneys and blader, no hydro    Assessment/ Plan: 1. AoCKD 4 - baseline creat 2.5 in 2019. AKI from severe dehydration/ underlying CKD.  UA neg and kidneys normal appearing by CT.  No better today, B/Cr worse and more lethargic, uremic and unstable, will need CRRT.  Moving to ICU when bed available.  2. Abd pain - ileus vs SBO per CT.  Gen surg following.  3. HTN - bp's soft to low, as above 4. CAD sp CABG 5. AMS - multifactorial      Rob Lisa Milian 03/15/2019, 12:16 PM  Inpatient medications: . aspirin  300 mg Rectal Daily  . Chlorhexidine Gluconate Cloth  6 each Topical Daily  . heparin  5,000 Units Subcutaneous Q8H  . insulin aspart  0-9 Units Subcutaneous Q4H  . sodium chloride flush  3 mL Intravenous Once   . sodium chloride 150 mL/hr at 03/15/19  1126  . calcium gluconate 1,000 mg (03/15/19 1126)  . ferumoxytol 510 mg (03/14/19 1413)   HYDROmorphone (DILAUDID) injection, labetalol, ondansetron (ZOFRAN) IV

## 2019-03-15 NOTE — Progress Notes (Signed)
OT Cancellation Note  Patient Details Name: [name] MRN: [id number] DOB: 12-24-1945   Cancelled Treatment:    Reason Eval/Treat Not Completed: Medical issues which prohibited therapy. B/Cr are up, hypotensive , tachy and tachypneic - Pt currently awaiting bed in ICU. OT will hold evaluation and continue to follow for medical appropriateness.   Merri Ray Calistro Rauf 03/15/2019, 12:43 PM   Hulda Humphrey OTR/L Acute Rehabilitation Services Pager: [phone] Office: [phone]

## 2019-03-16 ENCOUNTER — Inpatient Hospital Stay (HOSPITAL_COMMUNITY): Payer: Medicare HMO

## 2019-03-16 LAB — COMPREHENSIVE METABOLIC PANEL
ALT: 22 U/L (ref 0–44)
AST: 70 U/L — ABNORMAL HIGH (ref 15–41)
Albumin: 2.2 g/dL — ABNORMAL LOW (ref 3.5–5.0)
Alkaline Phosphatase: 47 U/L (ref 38–126)
Anion gap: 11 (ref 5–15)
BUN: 127 mg/dL — ABNORMAL HIGH (ref 8–23)
CO2: 18 mmol/L — ABNORMAL LOW (ref 22–32)
Calcium: 8.8 mg/dL — ABNORMAL LOW (ref 8.9–10.3)
Chloride: 114 mmol/L — ABNORMAL HIGH (ref 98–111)
Creatinine, Ser: 6.6 mg/dL — ABNORMAL HIGH (ref 0.61–1.24)
GFR calc Af Amer: 9 mL/min — ABNORMAL LOW (ref >60)
GFR calc non Af Amer: 8 mL/min — ABNORMAL LOW (ref >60)
Glucose, Bld: 88 mg/dL (ref 70–99)
Potassium: 6.1 mmol/L — ABNORMAL HIGH (ref 3.5–5.1)
Sodium: 143 mmol/L (ref 135–145)
Total Bilirubin: 1.2 mg/dL (ref 0.3–1.2)
Total Protein: 5.1 g/dL — ABNORMAL LOW (ref 6.5–8.1)

## 2019-03-16 LAB — GLUCOSE, CAPILLARY: Glucose-Capillary: 64 mg/dL — ABNORMAL LOW (ref 70–99)

## 2019-03-16 LAB — CBC
HCT: 22.2 % — ABNORMAL LOW (ref 39.0–52.0)
Hemoglobin: 6.8 g/dL — CL (ref 13.0–17.0)
MCH: 30.5 pg (ref 26.0–34.0)
MCHC: 30.6 g/dL (ref 30.0–36.0)
MCV: 99.6 fL (ref 80.0–100.0)
Platelets: 65 10*3/uL — ABNORMAL LOW (ref 150–400)
RBC: 2.23 MIL/uL — ABNORMAL LOW (ref 4.22–5.81)
RDW: 15 % (ref 11.5–15.5)
WBC: 2.4 10*3/uL — ABNORMAL LOW (ref 4.0–10.5)
nRBC: 0 % (ref 0.0–0.2)

## 2019-03-16 MED ORDER — DEXTROSE 50 % IV SOLN: 12.5000 g | INTRAVENOUS | Status: DC

## 2019-03-16 MED ORDER — DEXTROSE 50 % IV SOLN
INTRAVENOUS | Status: AC
  Filled 2019-03-16: qty 50

## 2019-03-19 ENCOUNTER — Other Ambulatory Visit (HOSPITAL_COMMUNITY): Payer: Self-pay | Admitting: Cardiology
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
## 2019-04-13 NOTE — Progress Notes (Addendum)
Sam NT asked me to check on patient. When I entered the room patient was not breathing and had no pulse. No heart sounds or breath sounds heard for 2 minutes. Time of death is 0400. Witnessed with Perry Mount, RN. Jeannette Corpus, NP notified.

## 2019-04-13 NOTE — Progress Notes (Signed)
Patient's daughter [name] notified of patient's passing.

## 2019-04-13 DEATH — deceased
